# Patient Record
Sex: Male | Born: 1970 | Race: Black or African American | Hispanic: No | Marital: Single | Smoking: Current every day smoker
Health system: Southern US, Community
[De-identification: ages and names within clinical notes are randomized; demographics above are authoritative.]

## PROBLEM LIST (undated history)

## (undated) ENCOUNTER — Emergency Department: Disposition: A | Discharge status: Home / Self Care | Payer: Medicare Other

## (undated) DIAGNOSIS — G4730 Sleep apnea, unspecified: Secondary | ICD-10-CM

## (undated) DIAGNOSIS — R809 Proteinuria, unspecified: Secondary | ICD-10-CM

## (undated) DIAGNOSIS — N289 Disorder of kidney and ureter, unspecified: Secondary | ICD-10-CM

## (undated) DIAGNOSIS — E785 Hyperlipidemia, unspecified: Secondary | ICD-10-CM

## (undated) DIAGNOSIS — I219 Acute myocardial infarction, unspecified: Secondary | ICD-10-CM

## (undated) DIAGNOSIS — I2510 Atherosclerotic heart disease of native coronary artery without angina pectoris: Secondary | ICD-10-CM

## (undated) DIAGNOSIS — I10 Essential (primary) hypertension: Secondary | ICD-10-CM

## (undated) DIAGNOSIS — I639 Cerebral infarction, unspecified: Secondary | ICD-10-CM

## (undated) DIAGNOSIS — IMO0001 Reserved for inherently not codable concepts without codable children: Secondary | ICD-10-CM

## (undated) DIAGNOSIS — J449 Chronic obstructive pulmonary disease, unspecified: Secondary | ICD-10-CM

## (undated) DIAGNOSIS — N186 End stage renal disease: Secondary | ICD-10-CM

## (undated) DIAGNOSIS — N2581 Secondary hyperparathyroidism of renal origin: Secondary | ICD-10-CM

## (undated) HISTORY — DX: Chronic obstructive pulmonary disease, unspecified: J44.9

## (undated) HISTORY — PX: CARDIAC CATHETERIZATION: SHX172

## (undated) HISTORY — DX: Essential (primary) hypertension: I10

## (undated) HISTORY — DX: Proteinuria, unspecified: R80.9

## (undated) HISTORY — DX: Hyperlipidemia, unspecified: E78.5

## (undated) HISTORY — PX: CORONARY STENT PLACEMENT: SHX1402

## (undated) HISTORY — DX: Secondary hyperparathyroidism of renal origin: N25.81

## (undated) HISTORY — DX: Cerebral infarction, unspecified: I63.9

## (undated) HISTORY — DX: Atherosclerotic heart disease of native coronary artery without angina pectoris: I25.10

---

## 2003-08-26 ENCOUNTER — Emergency Department (HOSPITAL_COMMUNITY): Admission: EM | Admit: 2003-08-26 | Discharge: 2003-08-26 | Payer: Self-pay | Admitting: Emergency Medicine

## 2005-01-13 ENCOUNTER — Emergency Department: Payer: Self-pay | Admitting: Internal Medicine

## 2005-01-13 ENCOUNTER — Other Ambulatory Visit: Payer: Self-pay

## 2005-02-18 ENCOUNTER — Other Ambulatory Visit: Payer: Self-pay

## 2005-02-18 ENCOUNTER — Inpatient Hospital Stay: Payer: Self-pay | Admitting: Internal Medicine

## 2005-09-28 ENCOUNTER — Other Ambulatory Visit: Payer: Self-pay

## 2005-09-28 ENCOUNTER — Observation Stay: Payer: Self-pay | Admitting: Internal Medicine

## 2005-10-10 ENCOUNTER — Emergency Department: Payer: Self-pay | Admitting: Emergency Medicine

## 2005-10-10 ENCOUNTER — Other Ambulatory Visit: Payer: Self-pay

## 2005-10-28 ENCOUNTER — Other Ambulatory Visit: Payer: Self-pay

## 2005-10-28 ENCOUNTER — Emergency Department: Payer: Self-pay | Admitting: Emergency Medicine

## 2005-11-08 ENCOUNTER — Other Ambulatory Visit: Payer: Self-pay

## 2005-11-08 ENCOUNTER — Emergency Department: Payer: Self-pay | Admitting: General Practice

## 2006-11-14 IMAGING — CR DG CHEST 1V PORT
1 series · 1 of 1 positions shown · non-contrast
Comparison: none

REASON FOR EXAM: Chest pain //rm11
COMMENTS:

[view not recorded]
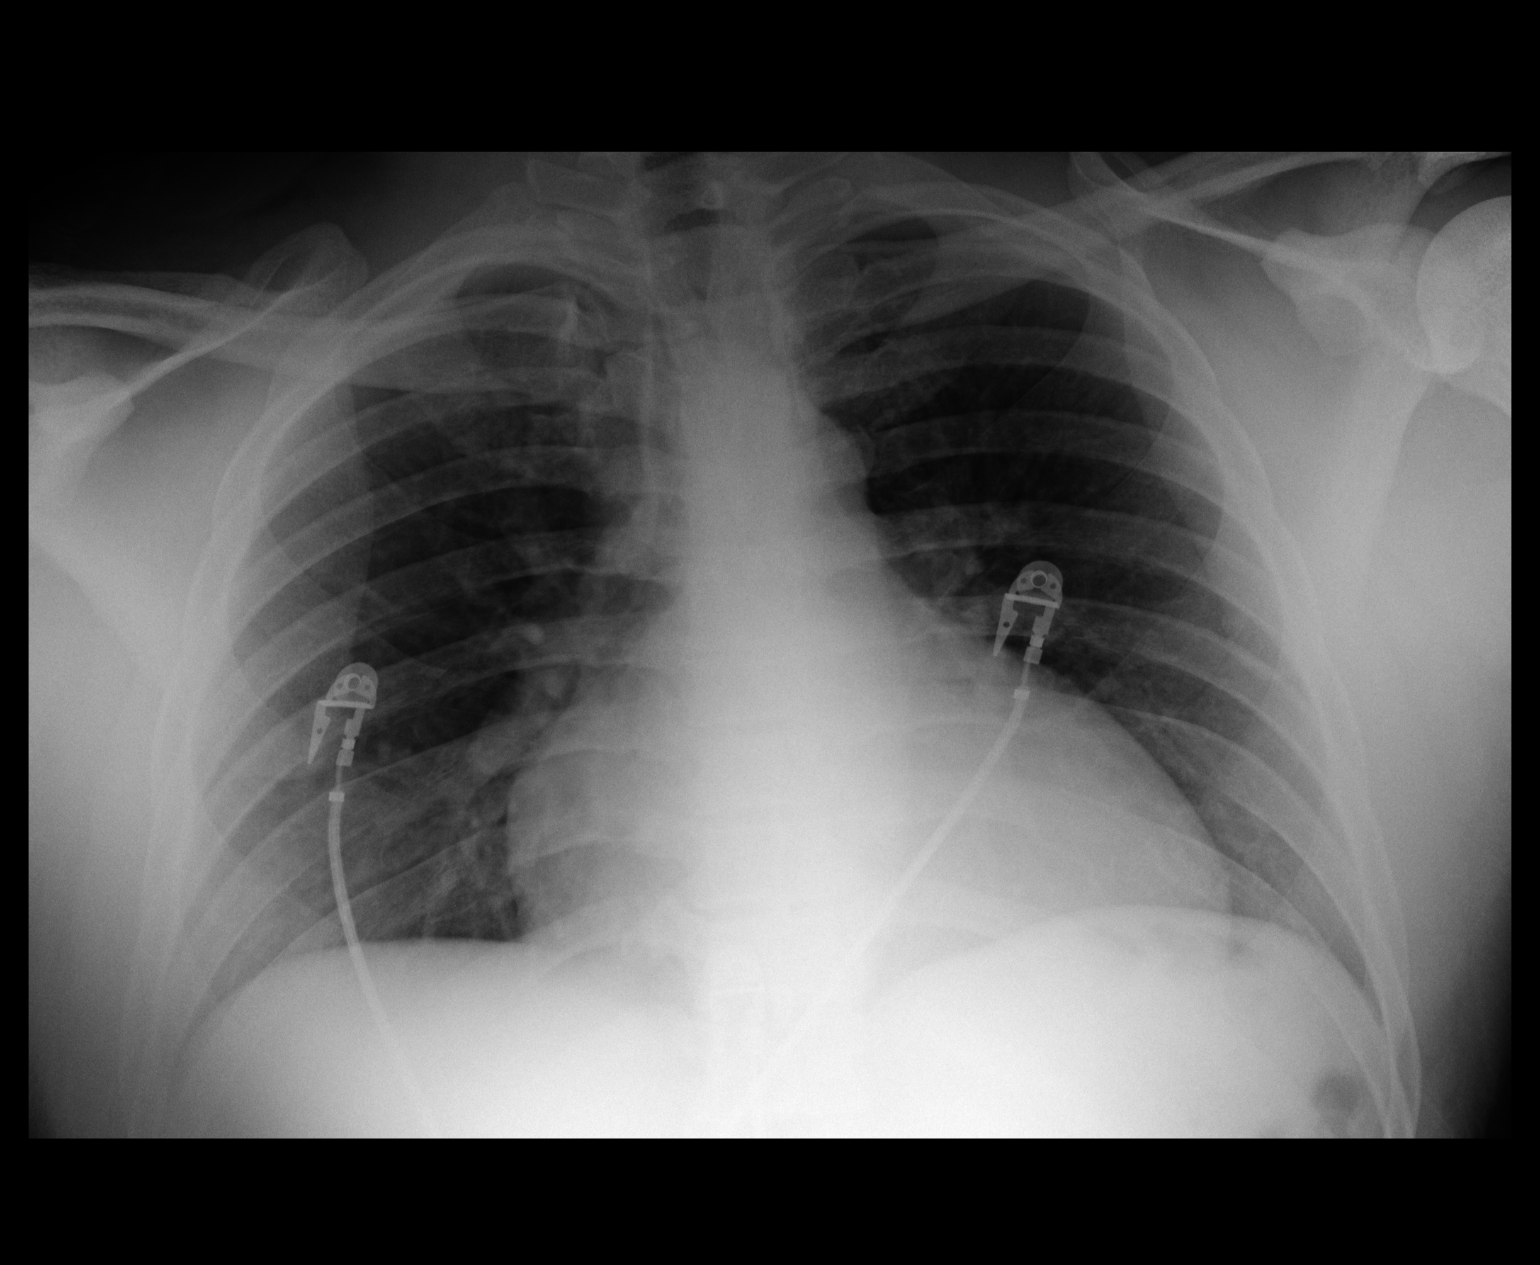

[1 of 1 positions shown; findings below may reference images not displayed]

PROCEDURE:     DXR - DXR PORTABLE CHEST SINGLE VIEW  - November 08, 2005  [DATE]

RESULT:     AP view of the chest is compared to the prior exam of 10/28/05.
The lung fields are clear.  The heart is enlarged but stable as compared to
the prior exam.  No pulmonary edema is seen.  Monitoring electrodes are
present.
IMPRESSION: The lung fields are clear.

Cardiomegaly.

## 2006-12-16 ENCOUNTER — Other Ambulatory Visit: Payer: Self-pay

## 2006-12-16 ENCOUNTER — Emergency Department: Payer: Self-pay | Admitting: Emergency Medicine

## 2011-09-14 ENCOUNTER — Emergency Department: Payer: Self-pay | Admitting: *Deleted

## 2011-09-14 LAB — COMPREHENSIVE METABOLIC PANEL
Albumin: 3.3 g/dL — ABNORMAL LOW (ref 3.4–5.0)
Anion Gap: 8 (ref 7–16)
Calcium, Total: 8.4 mg/dL — ABNORMAL LOW (ref 8.5–10.1)
EGFR (African American): 29 — ABNORMAL LOW
Glucose: 90 mg/dL (ref 65–99)
Osmolality: 291 (ref 275–301)
Potassium: 3.7 mmol/L (ref 3.5–5.1)
SGOT(AST): 29 U/L (ref 15–37)
Sodium: 143 mmol/L (ref 136–145)
Total Protein: 7.7 g/dL (ref 6.4–8.2)

## 2011-09-14 LAB — CBC
HGB: 13 g/dL (ref 13.0–18.0)
MCHC: 33.6 g/dL (ref 32.0–36.0)
RDW: 15.5 % — ABNORMAL HIGH (ref 11.5–14.5)
WBC: 8.8 10*3/uL (ref 3.8–10.6)

## 2011-09-14 LAB — TROPONIN I: Troponin-I: 0.04 ng/mL

## 2012-03-16 ENCOUNTER — Emergency Department: Payer: Self-pay | Admitting: Emergency Medicine

## 2012-03-16 LAB — BASIC METABOLIC PANEL
Anion Gap: 8 (ref 7–16)
BUN: 32 mg/dL — ABNORMAL HIGH (ref 7–18)
Calcium, Total: 8.5 mg/dL (ref 8.5–10.1)
Co2: 24 mmol/L (ref 21–32)
EGFR (African American): 26 — ABNORMAL LOW
EGFR (Non-African Amer.): 23 — ABNORMAL LOW
Glucose: 86 mg/dL (ref 65–99)
Potassium: 4.1 mmol/L (ref 3.5–5.1)
Sodium: 139 mmol/L (ref 136–145)

## 2012-03-24 ENCOUNTER — Inpatient Hospital Stay: Payer: Self-pay | Admitting: Internal Medicine

## 2012-03-24 LAB — URINALYSIS, COMPLETE
Bacteria: NONE SEEN
Bilirubin,UR: NEGATIVE
Glucose,UR: NEGATIVE mg/dL (ref 0–75)
Leukocyte Esterase: NEGATIVE
Nitrite: NEGATIVE
Protein: 100
RBC,UR: <1 /HPF (ref 0–5)
Specific Gravity: 1.009 (ref 1.003–1.030)
Squamous Epithelial: <1

## 2012-03-24 LAB — BASIC METABOLIC PANEL
BUN: 45 mg/dL — ABNORMAL HIGH (ref 7–18)
Calcium, Total: 8.5 mg/dL (ref 8.5–10.1)
Creatinine: 4.58 mg/dL — ABNORMAL HIGH (ref 0.60–1.30)
EGFR (African American): 17 — ABNORMAL LOW
Glucose: 80 mg/dL (ref 65–99)
Osmolality: 284 (ref 275–301)
Potassium: 3.9 mmol/L (ref 3.5–5.1)
Sodium: 137 mmol/L (ref 136–145)

## 2012-03-24 LAB — HEPATIC FUNCTION PANEL A (ARMC)
Albumin: 3.8 g/dL (ref 3.4–5.0)
Bilirubin, Direct: <0.1 mg/dL (ref 0.00–0.20)
Bilirubin,Total: 0.2 mg/dL (ref 0.2–1.0)
SGOT(AST): 21 U/L (ref 15–37)
SGPT (ALT): 19 U/L (ref 12–78)

## 2012-03-24 LAB — CBC
HGB: 13.9 g/dL (ref 13.0–18.0)
MCV: 86 fL (ref 80–100)
WBC: 8.5 10*3/uL (ref 3.8–10.6)

## 2012-03-24 LAB — TROPONIN I: Troponin-I: 0.06 ng/mL — ABNORMAL HIGH

## 2012-03-24 LAB — CK TOTAL AND CKMB (NOT AT ARMC): CK-MB: 2.2 ng/mL (ref 0.5–3.6)

## 2012-03-25 LAB — CBC WITH DIFFERENTIAL/PLATELET
Basophil #: 0 10*3/uL (ref 0.0–0.1)
Eosinophil %: 1.9 %
HCT: 38.1 % — ABNORMAL LOW (ref 40.0–52.0)
Lymphocyte #: 2.3 10*3/uL (ref 1.0–3.6)
MCHC: 34.1 g/dL (ref 32.0–36.0)
MCV: 85 fL (ref 80–100)
Monocyte #: 1 x10 3/mm (ref 0.2–1.0)
Monocyte %: 14.5 %
WBC: 6.9 10*3/uL (ref 3.8–10.6)

## 2012-03-25 LAB — BASIC METABOLIC PANEL
BUN: 49 mg/dL — ABNORMAL HIGH (ref 7–18)
Chloride: 106 mmol/L (ref 98–107)
EGFR (African American): 18 — ABNORMAL LOW
EGFR (Non-African Amer.): 15 — ABNORMAL LOW
Glucose: 109 mg/dL — ABNORMAL HIGH (ref 65–99)
Potassium: 3.7 mmol/L (ref 3.5–5.1)
Sodium: 140 mmol/L (ref 136–145)

## 2012-03-25 LAB — CK TOTAL AND CKMB (NOT AT ARMC)
CK, Total: 368 U/L — ABNORMAL HIGH (ref 35–232)
CK, Total: 359 U/L — ABNORMAL HIGH (ref 35–232)

## 2012-03-25 LAB — SODIUM, URINE, RANDOM: Sodium, Urine Random: 54 mmol/L (ref 20–110)

## 2012-03-25 LAB — PROTEIN / CREATININE RATIO, URINE: Protein/Creat. Ratio: 421 mg/gCREAT — ABNORMAL HIGH (ref 0–200)

## 2012-03-25 LAB — PHOSPHORUS: Phosphorus: 3.9 mg/dL (ref 2.5–4.9)

## 2012-03-26 ENCOUNTER — Ambulatory Visit: Payer: Self-pay | Admitting: Orthopedic Surgery

## 2012-03-26 LAB — BASIC METABOLIC PANEL
BUN: 42 mg/dL — ABNORMAL HIGH (ref 7–18)
Creatinine: 3.79 mg/dL — ABNORMAL HIGH (ref 0.60–1.30)
EGFR (Non-African Amer.): 19 — ABNORMAL LOW
Glucose: 89 mg/dL (ref 65–99)
Osmolality: 284 (ref 275–301)
Sodium: 137 mmol/L (ref 136–145)

## 2012-03-27 LAB — BASIC METABOLIC PANEL
Anion Gap: 10 (ref 7–16)
Calcium, Total: 8.9 mg/dL (ref 8.5–10.1)
Chloride: 106 mmol/L (ref 98–107)
Co2: 23 mmol/L (ref 21–32)
EGFR (Non-African Amer.): 20 — ABNORMAL LOW
Glucose: 69 mg/dL (ref 65–99)
Potassium: 3.7 mmol/L (ref 3.5–5.1)
Sodium: 139 mmol/L (ref 136–145)

## 2012-03-27 LAB — LIPID PANEL
Cholesterol: 262 mg/dL — ABNORMAL HIGH (ref 0–200)
HDL Cholesterol: 29 mg/dL — ABNORMAL LOW (ref 40–60)

## 2012-03-28 LAB — BASIC METABOLIC PANEL
Anion Gap: 8 (ref 7–16)
BUN: 40 mg/dL — ABNORMAL HIGH (ref 7–18)
Calcium, Total: 8.9 mg/dL (ref 8.5–10.1)
Chloride: 104 mmol/L (ref 98–107)
Co2: 25 mmol/L (ref 21–32)
Creatinine: 3.41 mg/dL — ABNORMAL HIGH (ref 0.60–1.30)
EGFR (African American): 24 — ABNORMAL LOW
EGFR (Non-African Amer.): 21 — ABNORMAL LOW
Glucose: 85 mg/dL (ref 65–99)
Osmolality: 283 (ref 275–301)
Potassium: 3.7 mmol/L (ref 3.5–5.1)
Sodium: 137 mmol/L (ref 136–145)

## 2012-03-28 LAB — PROTEIN ELECTROPHORESIS(ARMC)

## 2014-01-17 ENCOUNTER — Ambulatory Visit: Payer: Self-pay | Admitting: Vascular Surgery

## 2014-01-17 LAB — URINALYSIS, COMPLETE
Bacteria: NONE SEEN
Bilirubin,UR: NEGATIVE
Blood: NEGATIVE
GLUCOSE, UR: NEGATIVE mg/dL (ref 0–75)
Hyaline Cast: 2
Ketone: NEGATIVE
Leukocyte Esterase: NEGATIVE
NITRITE: NEGATIVE
Ph: 5 (ref 4.5–8.0)
Protein: 100
RBC,UR: <1 /HPF (ref 0–5)
SPECIFIC GRAVITY: 1.015 (ref 1.003–1.030)
Squamous Epithelial: 1

## 2014-01-17 LAB — PROTIME-INR
INR: 1.1
Prothrombin Time: 14 secs (ref 11.5–14.7)

## 2014-01-17 LAB — BASIC METABOLIC PANEL
ANION GAP: 3 — AB (ref 7–16)
BUN: 35 mg/dL — ABNORMAL HIGH (ref 7–18)
CHLORIDE: 111 mmol/L — AB (ref 98–107)
CREATININE: 3.84 mg/dL — AB (ref 0.60–1.30)
Calcium, Total: 8.6 mg/dL (ref 8.5–10.1)
Co2: 25 mmol/L (ref 21–32)
EGFR (African American): 22 — ABNORMAL LOW
EGFR (Non-African Amer.): 18 — ABNORMAL LOW
Glucose: 92 mg/dL (ref 65–99)
OSMOLALITY: 285 (ref 275–301)
Potassium: 4.7 mmol/L (ref 3.5–5.1)
Sodium: 139 mmol/L (ref 136–145)

## 2014-01-17 LAB — CBC
HCT: 40 % (ref 40.0–52.0)
HGB: 13.3 g/dL (ref 13.0–18.0)
MCH: 29.9 pg (ref 26.0–34.0)
MCHC: 33.2 g/dL (ref 32.0–36.0)
MCV: 90 fL (ref 80–100)
Platelet: 303 10*3/uL (ref 150–440)
RBC: 4.45 10*6/uL (ref 4.40–5.90)
RDW: 15.6 % — AB (ref 11.5–14.5)
WBC: 6.6 10*3/uL (ref 3.8–10.6)

## 2014-01-17 LAB — MRSA PCR SCREENING

## 2014-01-17 LAB — APTT: Activated PTT: 34.8 secs (ref 23.6–35.9)

## 2014-01-24 ENCOUNTER — Ambulatory Visit: Payer: Self-pay | Admitting: Vascular Surgery

## 2014-01-24 HISTORY — PX: AV FISTULA PLACEMENT: SHX1204

## 2014-03-12 ENCOUNTER — Other Ambulatory Visit: Payer: Self-pay | Admitting: *Deleted

## 2014-03-12 DIAGNOSIS — Z01810 Encounter for preprocedural cardiovascular examination: Secondary | ICD-10-CM

## 2014-03-12 DIAGNOSIS — N184 Chronic kidney disease, stage 4 (severe): Secondary | ICD-10-CM

## 2014-03-14 ENCOUNTER — Encounter: Payer: Self-pay | Admitting: Vascular Surgery

## 2014-04-04 ENCOUNTER — Encounter: Payer: Self-pay | Admitting: Vascular Surgery

## 2014-04-05 ENCOUNTER — Encounter: Payer: Self-pay | Admitting: Vascular Surgery

## 2014-04-05 ENCOUNTER — Ambulatory Visit (HOSPITAL_COMMUNITY)
Admission: RE | Admit: 2014-04-05 | Discharge: 2014-04-05 | Disposition: A | Discharge status: Home / Self Care | Payer: Medicare HMO | Source: Ambulatory Visit | Attending: Vascular Surgery | Admitting: Vascular Surgery

## 2014-04-05 ENCOUNTER — Ambulatory Visit (INDEPENDENT_AMBULATORY_CARE_PROVIDER_SITE_OTHER): Payer: Medicare HMO | Admitting: Vascular Surgery

## 2014-04-05 VITALS — BP 153/84 | HR 50 | Temp 97.8°F | Resp 14 | Ht 72.0 in | Wt 243.6 lb

## 2014-04-05 DIAGNOSIS — N184 Chronic kidney disease, stage 4 (severe): Secondary | ICD-10-CM

## 2014-04-05 DIAGNOSIS — Z01810 Encounter for preprocedural cardiovascular examination: Secondary | ICD-10-CM | POA: Diagnosis not present

## 2014-04-05 DIAGNOSIS — Z01812 Encounter for preprocedural laboratory examination: Secondary | ICD-10-CM

## 2014-04-05 DIAGNOSIS — N185 Chronic kidney disease, stage 5: Secondary | ICD-10-CM

## 2014-04-05 NOTE — Progress Notes (Signed)
HISTORY AND PHYSICAL     CC:  Evaluate access Referring Provider:  Lavonia Dana, MD  HPI: This is a 44 y.o. 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 Abigail Butts with [facility] in [place].  He states that he had a fistula placed in his left arm at the end of November.  He went back for his f/u appt and had a disagreement with the MD.  Dr. Abigail Butts has referred the pt to [facility] for further management of his fistula.  He states that he did get some numbness and tingling in his left arm after the fistula was placed.  He states that this has moved to his left thigh.  According to Dr. Marcy Salvo notes, he is to have his thigh pain evaluated by his primary MD.   He is hoping to be on the transplant list.  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  He states that he has 12 stents in his heart.  He has a hx of stroke also.    He takes a statin for his hypercholesterolemia.  He is on a beta blocker and aspirin for his heart disease.  He denies having diabetes.   Past Medical History  Diagnosis Date  . Chronic kidney disease   . Hypertension   . Stroke   . COPD (chronic obstructive pulmonary disease)   . Hyperlipidemia   . CAD (coronary artery disease)     status post multiple myocardial infarctions  . Proteinuria   . Secondary hyperparathyroidism of renal origin     Past Surgical History  Procedure Laterality Date  . Av fistula placement Left 01-24-14    By Dr. Hortencia Pilar in [place] Reactions  . Clonidine Derivatives   . Ibuprofen   . Viagra [Sildenafil]     Lips swell     Current Outpatient Prescriptions  Medication Sig Dispense Refill  . amLODipine (NORVASC) 10 MG tablet Take 10 mg by mouth daily.    Marland Kitchen aspirin 81 MG tablet Take 81 mg by mouth daily.    Marland Kitchen atorvastatin (LIPITOR) 20 MG tablet Take 20 mg by mouth daily.    . carvedilol (COREG) 6.25 MG tablet Take 6.25 mg by  mouth 2 (two) times daily with a meal.    . enalapril (VASOTEC) 10 MG tablet Take 10 mg by mouth daily.    Marland Kitchen HYDROcodone-acetaminophen (NORCO/VICODIN) 5-325 MG per tablet Take 1 tablet by mouth every 6 (six) hours as needed for moderate pain.    . nitroGLYCERIN (NITROSTAT) 0.4 MG SL tablet Place 0.4 mg under the tongue every 5 (five) minutes as needed for chest pain.     No current facility-administered medications for this visit.   Family History  Problem Relation Age of Onset  . Heart disease Mother   . Kidney disease Father   . Kidney disease Brother   . Hyperlipidemia Mother   . Hyperlipidemia Mother   . Hyperlipidemia Sister   . Hyperlipidemia Daughter   . Hypertension Mother   . Hypertension Sister   . Hypertension Daughter      History   Social History  . Marital Status: Divorced    Spouse Name: N/A    Number of Children: N/A  . Years of Education: N/A   Occupational History  . Not on file.   Social History Main Topics  . Smoking status: Current Every Day Smoker --  0.50 packs/day    Types: Cigarettes  . Smokeless tobacco: Not on file  . Alcohol Use: No  . Drug Use: Yes  . Sexual Activity: Not on file     Comment: " Smokes weed"   Other Topics Concern  . Not on file   Social History Narrative     ROS: [x]  Positive   [ ]  Negative   [ ]  All sytems reviewed and are negative  Cardiovascular: []  chest pain/pressure []  palpitations []  SOB lying flat []  DOE [x]  pain in legs while walking-left thigh numbness []  pain in feet when lying flat []  hx of DVT []  hx of phlebitis []  swelling in legs []  varicose veins  Pulmonary: []  productive cough []  asthma []  wheezing  Neurologic: []  weakness in []  arms []  legs []  numbness in []  arms []  legs [] difficulty speaking or slurred speech []  temporary loss of vision in one eye []  dizziness  Hematologic: []  bleeding problems []  problems with blood clotting easily  GI []  vomiting blood []  blood in  stool  GU: []  burning with urination []  blood in urine  Psychiatric: []  hx of major depression  Integumentary: []  rashes []  ulcers  Constitutional: []  fever []  chills   PHYSICAL EXAMINATION:  Filed Vitals:   04/05/14 1507  BP: 153/84  Pulse: 50  Temp: 97.8 F (36.6 C)  Resp: 14   Body mass index is 33.03 kg/(m^2).  General:  WDWN in NAD Gait: Normal HENT: WNL, normocephalic Pulmonary: normal non-labored breathing , without Rales, rhonchi,  wheezing Cardiac: RRR, without  Murmurs, rubs or gallops; without carotid bruits Skin: without rashes, without ulcers  Vascular Exam/Pulses:  Right Left  Radial 2+ (normal) 2+ (normal)  Ulnar Unable to palpate  Unable to palpate   brachial 2+ (normal) 2+ (normal)   Extremities: without ischemic changes, without Gangrene , without cellulitis; without open wounds; there is no bruit or thrill present within the fistula Musculoskeletal: no muscle wasting or atrophy  Neurologic: A&O X 3; Appropriate Affect ; SENSATION: normal; MOTOR FUNCTION:  moving all extremities equally. Speech is fluent/normal   Non-Invasive Vascular Imaging:   Occluded left brachiocephalic AVF  Pt meds includes: Statin:  Yes.   Beta Blocker:  Yes.   Aspirin:  Yes.   ACEI:  Yes.   ARB:  No. Other Antiplatelet/Anticoagulant:  No.    ASSESSMENT/PLAN:: 44 y.o. male with CKD IV who was [facility] for f/u of his fistula that was placed elsewhere.   -pt's dialysis duplex today reveals that his left brachiocephalic AVF that was placed in November of 2015 is occluded.  -the pt understands that this is occluded and we basically need to start evaluation for new access. -we will obtain vein mapping and determine what will be his best option for new access -he will come back next week for BUE vein mapping and Dr. Oneida Alar will call the pt to discuss best option.   [name], PA-C [facility] [phone]  Clinic MD:  Pt seen and  examined in conjunction with Dr. Oneida Alar  History and exam findings as above. He is right-handed. Patient has easily palpable brachial and radial pulses bilaterally. His previously placed left brachiocephalic fistula is occluded. The patient will return for bilateral upper extremity vein mapping next week. Based on the vein mapping findings we will plan a new access for him. I discussed with the patient today the possibility of a basilic vein transposition on the left side which would be more invasive  but stay in the left arm. Other options include possible radiocephalic or brachiocephalic fistula in the right arm and I discussed the possible complications of non-maturation of the fistula ischemic steal in the right arm. Other small complications including bleeding infection were discussed with the patient today as well. We will schedule him for a new access his symptoms we get the findings of the vein mapping next week.  Ruta Hinds, MD [facility] [place] Office: [phone] Pager: [phone]

## 2014-04-09 ENCOUNTER — Ambulatory Visit (HOSPITAL_COMMUNITY)
Admission: RE | Admit: 2014-04-09 | Discharge: 2014-04-09 | Disposition: A | Discharge status: Home / Self Care | Payer: Medicare HMO | Source: Ambulatory Visit | Attending: Vascular Surgery | Admitting: Vascular Surgery

## 2014-04-09 DIAGNOSIS — Z01812 Encounter for preprocedural laboratory examination: Secondary | ICD-10-CM

## 2014-04-09 DIAGNOSIS — N184 Chronic kidney disease, stage 4 (severe): Secondary | ICD-10-CM | POA: Insufficient documentation

## 2014-04-11 ENCOUNTER — Other Ambulatory Visit: Payer: Self-pay

## 2014-04-27 ENCOUNTER — Encounter (HOSPITAL_COMMUNITY): Payer: Self-pay | Admitting: *Deleted

## 2014-04-27 NOTE — Progress Notes (Signed)
SDW -pre-op assessment completed by pt. Pre-op instructions were given to Merry Proud, pt Education officer, museum as requested by pt.

## 2014-04-27 NOTE — Progress Notes (Signed)
Anesthesia Chart Review:  Pt is 44 year old male scheduled for R radiocephalic vs brachiocephalic AV fistula creation on 04/30/2014 with Dr. Oneida Alar.   Pt is same day work up.   PMH includes: pt reports hx of 13 heart attacks, 8 of them "massive" and hx of 12 cardiac stents. Also reports hx of stroke, COPD, HTN, CKD, hyperlipidemia.   EKG 01/17/2014: Sinus bradycardia (55 bpm). Nonspecific T wave abnormality. "When compared to EKG 03/24/2012, questionable change in QRS axis. ST elevation now present in inferior leads. T wave inversion less evident in lateral leads."  Echo 03/27/2012: -LV grossly normal size. There is no thrombus. LV systolic function is mildly reduced. EF = 45-50%. There is moderate concentric LV hypertrophy. There is mild global hypokinesis of the LV. There RV systolic function is borderline reduced.   By notes from [facility] 09/28/2005, pt had stent of OM1 branch done at [facility] 12/2004 following non-Q-wave MI. Pt underwent repeat cardiac cath 08/04/2005 which revealed restenosis of prior stent site, extending to the ostium and OM1 branch. Repeat PCI was not performed due to pt experiencing black, tarry stools.   Discussed limited information we have available with Dr. Conrad Harmony. Pt will need to be evaluated DOS.  Willeen Cass, FNP-BC [facility]/Anesthesiology Phone: [phone] 04/27/2014 4:21 PM

## 2014-04-29 MED ORDER — DEXTROSE 5 % IV SOLN
1.5000 g | INTRAVENOUS | Status: AC
  Administered 2014-04-30: 1.5 g via INTRAVENOUS
  Filled 2014-04-29: qty 1.5

## 2014-04-30 ENCOUNTER — Ambulatory Visit (HOSPITAL_COMMUNITY): Payer: Medicare HMO | Admitting: Emergency Medicine

## 2014-04-30 ENCOUNTER — Encounter (HOSPITAL_COMMUNITY): Payer: Self-pay | Admitting: *Deleted

## 2014-04-30 ENCOUNTER — Ambulatory Visit (HOSPITAL_COMMUNITY)
Admission: RE | Admit: 2014-04-30 | Discharge: 2014-04-30 | Disposition: A | Discharge status: Home / Self Care | Payer: Medicare HMO | Source: Ambulatory Visit | Attending: Vascular Surgery | Admitting: Vascular Surgery

## 2014-04-30 ENCOUNTER — Encounter (HOSPITAL_COMMUNITY)
Admission: RE | Disposition: A | Discharge status: Home / Self Care | Payer: Self-pay | Source: Ambulatory Visit | Attending: Vascular Surgery

## 2014-04-30 ENCOUNTER — Telehealth: Payer: Self-pay | Admitting: Vascular Surgery

## 2014-04-30 DIAGNOSIS — N186 End stage renal disease: Secondary | ICD-10-CM | POA: Diagnosis present

## 2014-04-30 DIAGNOSIS — Z79899 Other long term (current) drug therapy: Secondary | ICD-10-CM | POA: Insufficient documentation

## 2014-04-30 DIAGNOSIS — E785 Hyperlipidemia, unspecified: Secondary | ICD-10-CM | POA: Diagnosis not present

## 2014-04-30 DIAGNOSIS — Z955 Presence of coronary angioplasty implant and graft: Secondary | ICD-10-CM | POA: Insufficient documentation

## 2014-04-30 DIAGNOSIS — Z8673 Personal history of transient ischemic attack (TIA), and cerebral infarction without residual deficits: Secondary | ICD-10-CM | POA: Diagnosis not present

## 2014-04-30 DIAGNOSIS — Z841 Family history of disorders of kidney and ureter: Secondary | ICD-10-CM | POA: Diagnosis not present

## 2014-04-30 DIAGNOSIS — F17210 Nicotine dependence, cigarettes, uncomplicated: Secondary | ICD-10-CM | POA: Diagnosis not present

## 2014-04-30 DIAGNOSIS — I120 Hypertensive chronic kidney disease with stage 5 chronic kidney disease or end stage renal disease: Secondary | ICD-10-CM | POA: Diagnosis not present

## 2014-04-30 DIAGNOSIS — N185 Chronic kidney disease, stage 5: Secondary | ICD-10-CM

## 2014-04-30 DIAGNOSIS — J449 Chronic obstructive pulmonary disease, unspecified: Secondary | ICD-10-CM | POA: Diagnosis not present

## 2014-04-30 DIAGNOSIS — I252 Old myocardial infarction: Secondary | ICD-10-CM | POA: Diagnosis not present

## 2014-04-30 DIAGNOSIS — E780 Pure hypercholesterolemia: Secondary | ICD-10-CM | POA: Insufficient documentation

## 2014-04-30 DIAGNOSIS — I2510 Atherosclerotic heart disease of native coronary artery without angina pectoris: Secondary | ICD-10-CM | POA: Insufficient documentation

## 2014-04-30 DIAGNOSIS — Z7982 Long term (current) use of aspirin: Secondary | ICD-10-CM | POA: Insufficient documentation

## 2014-04-30 DIAGNOSIS — Z79891 Long term (current) use of opiate analgesic: Secondary | ICD-10-CM | POA: Diagnosis not present

## 2014-04-30 HISTORY — DX: Reserved for inherently not codable concepts without codable children: IMO0001

## 2014-04-30 HISTORY — PX: AV FISTULA PLACEMENT: SHX1204

## 2014-04-30 HISTORY — DX: Acute myocardial infarction, unspecified: I21.9

## 2014-04-30 LAB — POCT I-STAT 4, (NA,K, GLUC, HGB,HCT)
Glucose, Bld: 96 mg/dL (ref 70–99)
HCT: 41 % (ref 39.0–52.0)
Hemoglobin: 13.9 g/dL (ref 13.0–17.0)
Potassium: 4 mmol/L (ref 3.5–5.1)
Sodium: 140 mmol/L (ref 135–145)

## 2014-04-30 SURGERY — ARTERIOVENOUS (AV) FISTULA CREATION
Anesthesia: General | Site: Arm Upper | Laterality: Right

## 2014-04-30 MED ORDER — PROMETHAZINE HCL 25 MG/ML IJ SOLN: 6.2500 mg | INTRAMUSCULAR | Status: DC | PRN

## 2014-04-30 MED ORDER — PROPOFOL 10 MG/ML IV BOLUS
INTRAVENOUS | Status: DC | PRN
  Administered 2014-04-30: 20 mg via INTRAVENOUS
  Administered 2014-04-30: 40 mg via INTRAVENOUS
  Administered 2014-04-30: 130 mg via INTRAVENOUS

## 2014-04-30 MED ORDER — PROPOFOL 10 MG/ML IV BOLUS
INTRAVENOUS | Status: AC
  Filled 2014-04-30: qty 20

## 2014-04-30 MED ORDER — ROCURONIUM BROMIDE 50 MG/5ML IV SOLN
INTRAVENOUS | Status: AC
  Filled 2014-04-30: qty 1

## 2014-04-30 MED ORDER — FENTANYL CITRATE 0.05 MG/ML IJ SOLN
INTRAMUSCULAR | Status: AC
  Filled 2014-04-30: qty 2

## 2014-04-30 MED ORDER — SODIUM CHLORIDE 0.9 % IR SOLN
Status: DC | PRN
  Administered 2014-04-30: 500 mL

## 2014-04-30 MED ORDER — FENTANYL CITRATE 0.05 MG/ML IJ SOLN
25.0000 ug | INTRAMUSCULAR | Status: DC | PRN
  Administered 2014-04-30 (×2): 50 ug via INTRAVENOUS

## 2014-04-30 MED ORDER — LIDOCAINE HCL (PF) 1 % IJ SOLN
INTRAMUSCULAR | Status: AC
  Filled 2014-04-30: qty 30

## 2014-04-30 MED ORDER — PHENYLEPHRINE HCL 10 MG/ML IJ SOLN
10.0000 mg | INTRAVENOUS | Status: DC | PRN
  Administered 2014-04-30: 20 ug/min via INTRAVENOUS

## 2014-04-30 MED ORDER — GLYCOPYRROLATE 0.2 MG/ML IJ SOLN
INTRAMUSCULAR | Status: AC
  Filled 2014-04-30: qty 3

## 2014-04-30 MED ORDER — CHLORHEXIDINE GLUCONATE CLOTH 2 % EX PADS: 6.0000 | MEDICATED_PAD | Freq: Once | CUTANEOUS | Status: DC

## 2014-04-30 MED ORDER — EPHEDRINE SULFATE 50 MG/ML IJ SOLN
INTRAMUSCULAR | Status: DC | PRN
  Administered 2014-04-30 (×3): 10 mg via INTRAVENOUS

## 2014-04-30 MED ORDER — EPHEDRINE SULFATE 50 MG/ML IJ SOLN
INTRAMUSCULAR | Status: AC
  Filled 2014-04-30: qty 1

## 2014-04-30 MED ORDER — FENTANYL CITRATE 0.05 MG/ML IJ SOLN
INTRAMUSCULAR | Status: DC | PRN
  Administered 2014-04-30 (×4): 50 ug via INTRAVENOUS

## 2014-04-30 MED ORDER — MIDAZOLAM HCL 2 MG/2ML IJ SOLN
INTRAMUSCULAR | Status: AC
  Filled 2014-04-30: qty 2

## 2014-04-30 MED ORDER — DEXTROSE 5 % IV SOLN
INTRAVENOUS | Status: DC | PRN
  Administered 2014-04-30: 08:00:00 via INTRAVENOUS

## 2014-04-30 MED ORDER — MEPERIDINE HCL 25 MG/ML IJ SOLN: 6.2500 mg | INTRAMUSCULAR | Status: DC | PRN

## 2014-04-30 MED ORDER — ONDANSETRON HCL 4 MG/2ML IJ SOLN
INTRAMUSCULAR | Status: AC
  Filled 2014-04-30: qty 2

## 2014-04-30 MED ORDER — PHENYLEPHRINE 40 MCG/ML (10ML) SYRINGE FOR IV PUSH (FOR BLOOD PRESSURE SUPPORT)
PREFILLED_SYRINGE | INTRAVENOUS | Status: AC
  Filled 2014-04-30: qty 10

## 2014-04-30 MED ORDER — NEOSTIGMINE METHYLSULFATE 10 MG/10ML IV SOLN
INTRAVENOUS | Status: AC
  Filled 2014-04-30: qty 1

## 2014-04-30 MED ORDER — GLYCOPYRROLATE 0.2 MG/ML IJ SOLN
INTRAMUSCULAR | Status: DC | PRN
  Administered 2014-04-30: .05 mg via INTRAVENOUS
  Administered 2014-04-30: 0.6 mg via INTRAVENOUS
  Administered 2014-04-30: .15 mg via INTRAVENOUS

## 2014-04-30 MED ORDER — THROMBIN 20000 UNITS EX SOLR
CUTANEOUS | Status: AC
  Filled 2014-04-30: qty 20000

## 2014-04-30 MED ORDER — LIDOCAINE-EPINEPHRINE (PF) 1 %-1:200000 IJ SOLN
INTRAMUSCULAR | Status: AC
  Filled 2014-04-30: qty 10

## 2014-04-30 MED ORDER — FENTANYL CITRATE 0.05 MG/ML IJ SOLN
INTRAMUSCULAR | Status: AC
  Filled 2014-04-30: qty 5

## 2014-04-30 MED ORDER — ROCURONIUM BROMIDE 100 MG/10ML IV SOLN
INTRAVENOUS | Status: DC | PRN
  Administered 2014-04-30: 20 mg via INTRAVENOUS
  Administered 2014-04-30: 30 mg via INTRAVENOUS

## 2014-04-30 MED ORDER — LIDOCAINE-EPINEPHRINE (PF) 1 %-1:200000 IJ SOLN: INTRAMUSCULAR | Status: DC | PRN

## 2014-04-30 MED ORDER — HEPARIN SODIUM (PORCINE) 1000 UNIT/ML IJ SOLN
INTRAMUSCULAR | Status: AC
  Filled 2014-04-30: qty 1

## 2014-04-30 MED ORDER — LIDOCAINE HCL (CARDIAC) 20 MG/ML IV SOLN
INTRAVENOUS | Status: AC
  Filled 2014-04-30: qty 5

## 2014-04-30 MED ORDER — MIDAZOLAM HCL 5 MG/5ML IJ SOLN
INTRAMUSCULAR | Status: DC | PRN
  Administered 2014-04-30 (×2): 1 mg via INTRAVENOUS

## 2014-04-30 MED ORDER — LIDOCAINE HCL (CARDIAC) 20 MG/ML IV SOLN
INTRAVENOUS | Status: DC | PRN
  Administered 2014-04-30: 100 mg via INTRAVENOUS

## 2014-04-30 MED ORDER — ATROPINE SULFATE 1 MG/ML IJ SOLN
INTRAMUSCULAR | Status: DC | PRN
  Administered 2014-04-30: 0.2 mg via INTRAVENOUS

## 2014-04-30 MED ORDER — SODIUM CHLORIDE 0.9 % IJ SOLN
INTRAMUSCULAR | Status: AC
  Filled 2014-04-30: qty 10

## 2014-04-30 MED ORDER — NEOSTIGMINE METHYLSULFATE 10 MG/10ML IV SOLN
INTRAVENOUS | Status: DC | PRN
  Administered 2014-04-30: 4 mg via INTRAVENOUS

## 2014-04-30 MED ORDER — ATROPINE SULFATE 0.1 MG/ML IJ SOLN
INTRAMUSCULAR | Status: AC
  Filled 2014-04-30: qty 10

## 2014-04-30 MED ORDER — GLYCOPYRROLATE 0.2 MG/ML IJ SOLN
INTRAMUSCULAR | Status: AC
  Filled 2014-04-30: qty 1

## 2014-04-30 MED ORDER — 0.9 % SODIUM CHLORIDE (POUR BTL) OPTIME
TOPICAL | Status: DC | PRN
  Administered 2014-04-30: 1000 mL

## 2014-04-30 MED ORDER — TRAMADOL HCL 50 MG PO TABS: 50.0000 mg | ORAL_TABLET | Freq: Four times a day (QID) | ORAL | Status: DC | PRN

## 2014-04-30 MED ORDER — SODIUM CHLORIDE 0.9 % IV SOLN
INTRAVENOUS | Status: DC
  Administered 2014-04-30 (×2): via INTRAVENOUS

## 2014-04-30 MED ORDER — HEPARIN SODIUM (PORCINE) 1000 UNIT/ML IJ SOLN
INTRAMUSCULAR | Status: DC | PRN
  Administered 2014-04-30: 7000 [IU] via INTRAVENOUS

## 2014-04-30 SURGICAL SUPPLY — 40 items
ARMBAND PINK RESTRICT EXTREMIT (MISCELLANEOUS) ×2 IMPLANT
CANISTER SUCTION 2500CC (MISCELLANEOUS) ×2 IMPLANT
CANNULA VESSEL 3MM 2 BLNT TIP (CANNULA) ×2 IMPLANT
CLIP TI MEDIUM 6 (CLIP) ×2 IMPLANT
CLIP TI WIDE RED SMALL 6 (CLIP) ×2 IMPLANT
COVER PROBE W GEL 5X96 (DRAPES) ×1 IMPLANT
COVER SURGICAL LIGHT HANDLE (MISCELLANEOUS) IMPLANT
DECANTER SPIKE VIAL GLASS SM (MISCELLANEOUS) ×2 IMPLANT
DRAIN PENROSE 1/4X12 LTX STRL (WOUND CARE) ×2 IMPLANT
ELECT REM PT RETURN 9FT ADLT (ELECTROSURGICAL) ×2
ELECTRODE REM PT RTRN 9FT ADLT (ELECTROSURGICAL) ×1 IMPLANT
GLOVE BIO SURGEON STRL SZ7.5 (GLOVE) ×3 IMPLANT
GLOVE BIOGEL PI IND STRL 6.5 (GLOVE) ×1 IMPLANT
GLOVE BIOGEL PI IND STRL 7.0 (GLOVE) ×2 IMPLANT
GLOVE BIOGEL PI IND STRL 8 (GLOVE) ×1 IMPLANT
GLOVE BIOGEL PI INDICATOR 6.5 (GLOVE) ×1
GLOVE BIOGEL PI INDICATOR 7.0 (GLOVE) ×2
GLOVE BIOGEL PI INDICATOR 8 (GLOVE) ×1
GLOVE ECLIPSE 6.5 STRL STRAW (GLOVE) ×2 IMPLANT
GLOVE SURG SS PI 7.0 STRL IVOR (GLOVE) ×2 IMPLANT
GOWN STRL REUS W/ TWL LRG LVL3 (GOWN DISPOSABLE) ×4 IMPLANT
GOWN STRL REUS W/ TWL XL LVL3 (GOWN DISPOSABLE) ×1 IMPLANT
GOWN STRL REUS W/TWL LRG LVL3 (GOWN DISPOSABLE) ×8
GOWN STRL REUS W/TWL XL LVL3 (GOWN DISPOSABLE) ×2
KIT BASIN OR (CUSTOM PROCEDURE TRAY) ×2 IMPLANT
KIT ROOM TURNOVER OR (KITS) ×2 IMPLANT
LIQUID BAND (GAUZE/BANDAGES/DRESSINGS) ×2 IMPLANT
LOOP VESSEL MINI RED (MISCELLANEOUS) IMPLANT
NS IRRIG 1000ML POUR BTL (IV SOLUTION) ×2 IMPLANT
PACK CV ACCESS (CUSTOM PROCEDURE TRAY) ×2 IMPLANT
PAD ARMBOARD 7.5X6 YLW CONV (MISCELLANEOUS) ×4 IMPLANT
PROBE PENCIL 8 MHZ STRL DISP (MISCELLANEOUS) IMPLANT
SPONGE SURGIFOAM ABS GEL 100 (HEMOSTASIS) IMPLANT
SUT PROLENE 6 0 BV (SUTURE) ×1 IMPLANT
SUT PROLENE 7 0 BV 1 (SUTURE) ×2 IMPLANT
SUT VIC AB 3-0 SH 27 (SUTURE) ×2
SUT VIC AB 3-0 SH 27X BRD (SUTURE) ×1 IMPLANT
SUT VICRYL 4-0 PS2 18IN ABS (SUTURE) ×2 IMPLANT
UNDERPAD 30X30 INCONTINENT (UNDERPADS AND DIAPERS) ×2 IMPLANT
WATER STERILE IRR 1000ML POUR (IV SOLUTION) ×2 IMPLANT

## 2014-04-30 NOTE — Anesthesia Preprocedure Evaluation (Addendum)
Anesthesia Evaluation  Patient identified by MRN, date of birth, ID band Patient awake    Reviewed: Allergy & Precautions, NPO status , Patient's Chart, lab work & pertinent test results, reviewed documented beta blocker date and time   Airway Mallampati: II  TM Distance: >3 FB Neck ROM: Full    Dental  (+) Dental Advisory Given, Teeth Intact   Pulmonary COPDCurrent Smoker,  breath sounds clear to auscultation        Cardiovascular hypertension, Pt. on medications Rhythm:Regular  ECHO 2014 EF 45-50%, had STENT to OM 207, has since stenosed, mild diffuse hypokin   Neuro/Psych    GI/Hepatic negative GI ROS, Neg liver ROS,   Endo/Other    Renal/GU ESRFRenal diseasePending hemodialysis     Musculoskeletal   Abdominal (+)  Abdomen: soft.    Peds  Hematology   Anesthesia Other Findings   Reproductive/Obstetrics                        Anesthesia Physical Anesthesia Plan  ASA: III  Anesthesia Plan: General   Post-op Pain Management:    Induction: Intravenous  Airway Management Planned: Oral ETT  Additional Equipment:   Intra-op Plan:   Post-operative Plan:   Informed Consent: I have reviewed the patients History and Physical, chart, labs and discussed the procedure including the risks, benefits and alternatives for the proposed anesthesia with the patient or authorized representative who has indicated his/her understanding and acceptance.     Plan Discussed with:   Anesthesia Plan Comments: (Need am ISTAT)      Anesthesia Quick Evaluation

## 2014-04-30 NOTE — Transfer of Care (Signed)
Immediate Anesthesia Transfer of Care Note  Patient: Fernando Salas  Procedure(s) Performed: Procedure(s): RIGHT BRACHIOCEPHALIC ARTERIOVENOUS (AV) FISTULA CREATION (Right)  Patient Location: PACU  Anesthesia Type:General  Level of Consciousness: sedated  Airway & Oxygen Therapy: Patient Spontanous Breathing and Patient connected to nasal cannula oxygen  Post-op Assessment: Report given to RN and Patient moving all extremities  Post vital signs: Reviewed and stable  Last Vitals:  Filed Vitals:   04/30/14 0554  BP: 181/87  Pulse: 47  Temp: 36.7 C  Resp: 20    Complications: No apparent anesthesia complications

## 2014-04-30 NOTE — H&P (View-Only) (Signed)
HISTORY AND PHYSICAL     CC:  Evaluate access Referring Provider:  Lavonia Dana, MD  HPI: This is a 44 y.o. 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 Abigail Salas with [name] in [place].  He states that he had a fistula placed in his left arm at the end of November.  He went back for his f/u appt and had a disagreement with the MD.  Dr. Abigail Salas has referred the pt to [facility] for further management of his fistula.  He states that he did get some numbness and tingling in his left arm after the fistula was placed.  He states that this has moved to his left thigh.  According to Dr. Marcy Salas notes, he is to have his thigh pain evaluated by his primary MD.   He is hoping to be on the transplant list.  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  He states that he has 12 stents in his heart.  He has a hx of stroke also.    He takes a statin for his hypercholesterolemia.  He is on a beta blocker and aspirin for his heart disease.  He denies having diabetes.   Past Medical History  Diagnosis Date  . Chronic kidney disease   . Hypertension   . Stroke   . COPD (chronic obstructive pulmonary disease)   . Hyperlipidemia   . CAD (coronary artery disease)     status post multiple myocardial infarctions  . Proteinuria   . Secondary hyperparathyroidism of renal origin     Past Surgical History  Procedure Laterality Date  . Av fistula placement Left 01-24-14    By Dr. Hortencia Salas in [place] Reactions  . Clonidine Derivatives   . Ibuprofen   . Viagra [Sildenafil]     Lips swell     Current Outpatient Prescriptions  Medication Sig Dispense Refill  . amLODipine (NORVASC) 10 MG tablet Take 10 mg by mouth daily.    Marland Kitchen aspirin 81 MG tablet Take 81 mg by mouth daily.    Marland Kitchen atorvastatin (LIPITOR) 20 MG tablet Take 20 mg by mouth daily.    . carvedilol (COREG) 6.25 MG tablet Take 6.25 mg by  mouth 2 (two) times daily with a meal.    . enalapril (VASOTEC) 10 MG tablet Take 10 mg by mouth daily.    Marland Kitchen HYDROcodone-acetaminophen (NORCO/VICODIN) 5-325 MG per tablet Take 1 tablet by mouth every 6 (six) hours as needed for moderate pain.    . nitroGLYCERIN (NITROSTAT) 0.4 MG SL tablet Place 0.4 mg under the tongue every 5 (five) minutes as needed for chest pain.     No current facility-administered medications for this visit.   Family History  Problem Relation Age of Onset  . Heart disease Mother   . Kidney disease Father   . Kidney disease Brother   . Hyperlipidemia Mother   . Hyperlipidemia Mother   . Hyperlipidemia Sister   . Hyperlipidemia Daughter   . Hypertension Mother   . Hypertension Sister   . Hypertension Daughter      History   Social History  . Marital Status: Divorced    Spouse Name: N/A    Number of Children: N/A  . Years of Education: N/A   Occupational History  . Not on file.   Social History Main Topics  . Smoking status: Current Every Day Smoker --  0.50 packs/day    Types: Cigarettes  . Smokeless tobacco: Not on file  . Alcohol Use: No  . Drug Use: Yes  . Sexual Activity: Not on file     Comment: " Smokes weed"   Other Topics Concern  . Not on file   Social History Narrative     ROS: [x]  Positive   [ ]  Negative   [ ]  All sytems reviewed and are negative  Cardiovascular: []  chest pain/pressure []  palpitations []  SOB lying flat []  DOE [x]  pain in legs while walking-left thigh numbness []  pain in feet when lying flat []  hx of DVT []  hx of phlebitis []  swelling in legs []  varicose veins  Pulmonary: []  productive cough []  asthma []  wheezing  Neurologic: []  weakness in []  arms []  legs []  numbness in []  arms []  legs [] difficulty speaking or slurred speech []  temporary loss of vision in one eye []  dizziness  Hematologic: []  bleeding problems []  problems with blood clotting easily  GI []  vomiting blood []  blood in  stool  GU: []  burning with urination []  blood in urine  Psychiatric: []  hx of major depression  Integumentary: []  rashes []  ulcers  Constitutional: []  fever []  chills   PHYSICAL EXAMINATION:  Filed Vitals:   04/05/14 1507  BP: 153/84  Pulse: 50  Temp: 97.8 F (36.6 C)  Resp: 14   Body mass index is 33.03 kg/(m^2).  General:  WDWN in NAD Gait: Normal HENT: WNL, normocephalic Pulmonary: normal non-labored breathing , without Rales, rhonchi,  wheezing Cardiac: RRR, without  Murmurs, rubs or gallops; without carotid bruits Skin: without rashes, without ulcers  Vascular Exam/Pulses:  Right Left  Radial 2+ (normal) 2+ (normal)  Ulnar Unable to palpate  Unable to palpate   brachial 2+ (normal) 2+ (normal)   Extremities: without ischemic changes, without Gangrene , without cellulitis; without open wounds; there is no bruit or thrill present within the fistula Musculoskeletal: no muscle wasting or atrophy  Neurologic: A&O X 3; Appropriate Affect ; SENSATION: normal; MOTOR FUNCTION:  moving all extremities equally. Speech is fluent/normal   Non-Invasive Vascular Imaging:   Occluded left brachiocephalic AVF  Pt meds includes: Statin:  Yes.   Beta Blocker:  Yes.   Aspirin:  Yes.   ACEI:  Yes.   ARB:  No. Other Antiplatelet/Anticoagulant:  No.    ASSESSMENT/PLAN:: 44 y.o. male with CKD IV who was [facility] for f/u of his fistula that was placed elsewhere.   -pt's dialysis duplex today reveals that his left brachiocephalic AVF that was placed in November of 2015 is occluded.  -the pt understands that this is occluded and we basically need to start evaluation for new access. -we will obtain vein mapping and determine what will be his best option for new access -he will come back next week for BUE vein mapping and Dr. Oneida Alar will call the pt to discuss best option.   Fernando Locket, PA-C [facility] [phone]  Clinic MD:  Pt seen and  examined in conjunction with Dr. Oneida Alar  History and exam findings as above. He is right-handed. Patient has easily palpable brachial and radial pulses bilaterally. His previously placed left brachiocephalic fistula is occluded. The patient will return for bilateral upper extremity vein mapping next week. Based on the vein mapping findings we will plan a new access for him. I discussed with the patient today the possibility of a basilic vein transposition on the left side which would be more invasive  but stay in the left arm. Other options include possible radiocephalic or brachiocephalic fistula in the right arm and I discussed the possible complications of non-maturation of the fistula ischemic steal in the right arm. Other small complications including bleeding infection were discussed with the patient today as well. We will schedule him for a new access his symptoms we get the findings of the vein mapping next week.  Ruta Hinds, MD [facility] [place] Office: [phone] Pager: [phone]

## 2014-04-30 NOTE — Discharge Instructions (Signed)
°What to eat: ° °For your first meals, you should eat lightly; only small meals initially.  If you do not have nausea, you may eat larger meals.  Avoid spicy, greasy and heavy food.   ° °General Anesthesia, Adult, Care After  °Refer to this sheet in the next few weeks. These instructions provide you with information on caring for yourself after your procedure. Your health care provider may also give you more specific instructions. Your treatment has been planned according to current medical practices, but problems sometimes occur. Call your health care provider if you have any problems or questions after your procedure.  °WHAT TO EXPECT AFTER THE PROCEDURE  °After the procedure, it is typical to experience:  °Sleepiness.  °Nausea and vomiting. °HOME CARE INSTRUCTIONS  °For the first 24 hours after general anesthesia:  °Have a responsible person with you.  °Do not drive a car. If you are alone, do not take public transportation.  °Do not drink alcohol.  °Do not take medicine that has not been prescribed by your health care provider.  °Do not sign important papers or make important decisions.  °You may resume a normal diet and activities as directed by your health care provider.  °Change bandages (dressings) as directed.  °If you have questions or problems that seem related to general anesthesia, call the hospital and ask for the anesthetist or anesthesiologist on call. °SEEK MEDICAL CARE IF:  °You have nausea and vomiting that continue the day after anesthesia.  °You develop a rash. °SEEK IMMEDIATE MEDICAL CARE IF:  °You have difficulty breathing.  °You have chest pain.  °You have any allergic problems. °Document Released: 05/25/2000 Document Revised: 10/19/2012 Document Reviewed: 09/01/2012  °ExitCare® Patient Information ©2014 [employer].  ° °Sore Throat  ° ° °A sore throat is a painful, burning, sore, or scratchy feeling of the throat. There may be pain or tenderness when swallowing or talking. You may have  other symptoms with a sore throat. These include coughing, sneezing, fever, or a swollen neck. A sore throat is often the first sign of another sickness. These sicknesses may include a cold, flu, strep throat, or an infection called mono. Most sore throats go away without medical treatment.  °HOME CARE  °Only take medicine as told by your doctor.  °Drink enough fluids to keep your pee (urine) clear or pale yellow.  °Rest as needed.  °Try using throat sprays, lozenges, or suck on hard candy (if older than 4 years or as told).  °Sip warm liquids, such as broth, herbal tea, or warm water with honey. Try sucking on frozen ice pops or drinking cold liquids.  °Rinse the mouth (gargle) with salt water. Mix 1 teaspoon salt with 8 ounces of water.  °Do not smoke. Avoid being around others when they are smoking.  °Put a humidifier in your bedroom at night to moisten the air. You can also turn on a hot shower and sit in the bathroom for 5-10 minutes. Be sure the bathroom door is closed. °GET HELP RIGHT AWAY IF:  °You have trouble breathing.  °You cannot swallow fluids, soft foods, or your spit (saliva).  °You have more puffiness (swelling) in the throat.  °Your sore throat does not get better in 7 days.  °You feel sick to your stomach (nauseous) and throw up (vomit).  °You have a fever or lasting symptoms for more than 2-3 days.  °You have a fever and your symptoms suddenly get worse. °MAKE SURE YOU:  °Understand these   instructions.  °Will watch your condition.  °Will get help right away if you are not doing well or get worse. °Document Released: 11/26/2007 Document Revised: 11/11/2011 Document Reviewed: 10/25/2011  °ExitCare® Patient Information ©2015 [employer]. This information is not intended to replace advice given to you by your health care provider. Make sure you discuss any questions you have with your health care provider.  ° ° ° °

## 2014-04-30 NOTE — Telephone Encounter (Signed)
-----   Message from Mena Goes, RN sent at 04/30/2014  9:55 AM EST ----- Regarding: Schedule   ----- Message -----    From: Elam Dutch, MD    Sent: 04/30/2014   9:27 AM      To: [facility] Charge Pool  Right brachial cephalic AVF Follow up with me in 2 weeks Nurse asst  Ruta Hinds

## 2014-04-30 NOTE — Anesthesia Postprocedure Evaluation (Signed)
  Anesthesia Post-op Note  Patient: Fernando Salas  Procedure(s) Performed: Procedure(s): RIGHT BRACHIOCEPHALIC ARTERIOVENOUS (AV) FISTULA CREATION (Right)  Patient Location: PACU  Anesthesia Type:General  Level of Consciousness: awake and alert   Airway and Oxygen Therapy: Patient Spontanous Breathing and Patient connected to nasal cannula oxygen  Post-op Pain: mild  Post-op Assessment: Post-op Vital signs reviewed, Patient's Cardiovascular Status Stable, Respiratory Function Stable, Patent Airway and No signs of Nausea or vomiting  Post-op Vital Signs: Reviewed and stable  Last Vitals:  Filed Vitals:   04/30/14 0554  BP: 181/87  Pulse: 47  Temp: 36.7 C  Resp: 20    Complications: No apparent anesthesia complications

## 2014-04-30 NOTE — Telephone Encounter (Signed)
Left msg for patient re appt, dpm

## 2014-04-30 NOTE — Anesthesia Procedure Notes (Signed)
Procedure Name: Intubation Date/Time: 04/30/2014 7:51 AM Performed by: Terrill Mohr Pre-anesthesia Checklist: Patient identified, Emergency Drugs available, Suction available and Patient being monitored Patient Re-evaluated:Patient Re-evaluated prior to inductionOxygen Delivery Method: Circle system utilized Preoxygenation: Pre-oxygenation with 100% oxygen Intubation Type: IV induction Ventilation: Mask ventilation without difficulty Laryngoscope Size: Mac and 4 Grade View: Grade II Tube type: Oral Tube size: 7.5 mm Number of attempts: 1 Airway Equipment and Method: Stylet Placement Confirmation: ETT inserted through vocal cords under direct vision,  breath sounds checked- equal and bilateral and positive ETCO2 Secured at: 22 (cm at lips) cm Tube secured with: Tape Dental Injury: Teeth and Oropharynx as per pre-operative assessment

## 2014-04-30 NOTE — Op Note (Signed)
Procedure: Right Brachial Cephalic AV fistula  Preop: ESRD  Postop: ESRD  Anesthesia: General  Assistant:Lanita Presson, RNFA  Findings: 3.5 mm cephalic vein proximally tapering to 2 mm in the upper arm.  Thickened right brachial artery  Procedure: After obtaining informed consent, the patient was taken to the operating room.  After induction of general anesthesia, the right upper extremity was prepped and draped in usual sterile fashion.  A transverse incision was then made near the antecubital crease the right arm. The incision was carried into the subcutaneous tissues down to level of the cephalic vein. The cephalic vein was approximately 3 mm in diameter. It was of good quality proximally but tapered to 2 mm in the upper arm. This was dissected free circumferentially and small side branches ligated and divided between silk ties or clips. Next the brachial artery was dissected free in the medial portion of the incision. The artery was  3-4 mm in diameter but thickened on palpation. The vessel loops were placed proximal and distal to the planned site of arteriotomy. The patient was given 7000 units of intravenous heparin. After appropriate circulation time, the vessel loops were used to control the artery. A longitudinal opening was made in the brachial artery.  The vein was ligated distally with a 2-0 silk tie. The vein was controlled proximally with a fine bulldog clamp. The vein was then swung over to the artery and sewn end of vein to side of artery using a running 6-0 Prolene suture. Just prior to completion of the anastomosis, everything was fore bled back bled and thoroughly flushed. The anastomosis was secured, vessel loops released, and there was a pulsatile flow in the fistula immediately. After hemostasis was obtained, the subcutaneous tissues were reapproximated using a running 3-0 Vicryl suture. The skin was then closed with a 4 Vicryl subcuticular stitch. Dermabond was applied to the  skin incision.  The patient had a palpable radial pulse at the end of the case.  Ruta Hinds, MD [facility] [place] Office: [phone] Pager: [phone]

## 2014-04-30 NOTE — Interval H&P Note (Signed)
History and Physical Interval Note:  04/30/2014 7:32 AM  Fernando Salas  has presented today for surgery, with the diagnosis of Stage IV Chronic Kidney Disease N18.4  The various methods of treatment have been discussed with the patient and family. After consideration of risks, benefits and other options for treatment, the patient has consented to  Procedure(s): RADIOCEPHALIC VERSUS BRACHIOCEPHALIC ARTERIOVENOUS (AV) FISTULA CREATION (Right) as a surgical intervention .  The patient's history has been reviewed, patient examined, no change in status, stable for surgery.  I have reviewed the patient's chart and labs.  Questions were answered to the patient's satisfaction.     Vittorio Mohs E

## 2014-05-01 ENCOUNTER — Encounter (HOSPITAL_COMMUNITY): Payer: Self-pay | Admitting: Vascular Surgery

## 2014-05-14 DIAGNOSIS — Z0279 Encounter for issue of other medical certificate: Secondary | ICD-10-CM

## 2014-05-15 ENCOUNTER — Encounter: Payer: Self-pay | Admitting: Vascular Surgery

## 2014-05-16 ENCOUNTER — Encounter: Payer: Self-pay | Admitting: Vascular Surgery

## 2014-05-16 ENCOUNTER — Other Ambulatory Visit: Payer: Self-pay

## 2014-05-16 ENCOUNTER — Ambulatory Visit (INDEPENDENT_AMBULATORY_CARE_PROVIDER_SITE_OTHER): Payer: Self-pay | Admitting: Vascular Surgery

## 2014-05-16 VITALS — BP 150/81 | HR 51 | Resp 14 | Ht 72.0 in | Wt 227.0 lb

## 2014-05-16 DIAGNOSIS — N184 Chronic kidney disease, stage 4 (severe): Secondary | ICD-10-CM

## 2014-05-16 NOTE — Progress Notes (Signed)
Patient is a 44 year old male returns for postoperative follow-up today. He underwent placement of a right brachiocephalic AV fistula on February 29. The vein was marginal. He is currently not on dialysis. He states he may be on dialysis within the next 6 months. He is right-handed.  Physical exam:  Filed Vitals:   05/16/14 0921  BP: 150/81  Pulse: 51  Resp: 14  Height: 6' (1.829 m)  Weight: 227 lb (102.967 kg)    Left upper extremity: Well-healed left antecubital incision 2+ brachial and radial pulse  Right upper extremity: Healing right antecubital incision 2+ brachial and radial pulse no audible bruit or palpable thrill in fistula  Assessment: The patient has had 2 early failures a brachiocephalic AV fistula's. Although his basilic is patent in the right arm it is small. The left basilic vein all the patent may not mature. The patient is thoroughly frustrated that he has now had 2 access failures and less than one year.  I believe the best option at this point be to place a left upper arm AV graft. Clearly the patient is frustrated by failed access attempts. I believe this to be his best option to get access up and running. Risks benefits possible, patient and procedure details of AV graft were discussed the patient today including not limited to ischemic steal graft thrombosis 25% one year, infection bleeding. He understands and agrees to proceed.  Plan: Left arm AV graft 05/31/2014.  Ruta Hinds, MD [facility] [place] Office: [phone] Pager: [phone]

## 2014-05-17 ENCOUNTER — Encounter: Payer: Medicare HMO | Admitting: Vascular Surgery

## 2014-05-17 DIAGNOSIS — Z0279 Encounter for issue of other medical certificate: Secondary | ICD-10-CM | POA: Diagnosis not present

## 2014-05-29 ENCOUNTER — Encounter (HOSPITAL_COMMUNITY): Payer: Self-pay | Admitting: *Deleted

## 2014-05-29 NOTE — Progress Notes (Signed)
Pt SDW-pre-op call was completed by pt. Pt denied SOB and chest pain. While instructions were being provided,  pt stated " Miss,  I been through this 3 times ! " Pt verbalized understanding of al linstructions and ended call abruptly.

## 2014-05-30 MED ORDER — SODIUM CHLORIDE 0.9 % IV SOLN
INTRAVENOUS | Status: DC
  Administered 2014-05-31: 08:00:00 via INTRAVENOUS

## 2014-05-30 MED ORDER — CHLORHEXIDINE GLUCONATE CLOTH 2 % EX PADS: 6.0000 | MEDICATED_PAD | Freq: Once | CUTANEOUS | Status: DC

## 2014-05-30 MED ORDER — DEXTROSE 5 % IV SOLN
1.5000 g | INTRAVENOUS | Status: AC
  Administered 2014-05-31: 1.5 g via INTRAVENOUS
  Filled 2014-05-30: qty 1.5

## 2014-05-31 ENCOUNTER — Ambulatory Visit (HOSPITAL_COMMUNITY)
Admission: RE | Admit: 2014-05-31 | Discharge: 2014-05-31 | Disposition: A | Discharge status: Home / Self Care | Payer: Medicare HMO | Source: Ambulatory Visit | Attending: Vascular Surgery | Admitting: Vascular Surgery

## 2014-05-31 ENCOUNTER — Encounter (HOSPITAL_COMMUNITY): Payer: Self-pay | Admitting: *Deleted

## 2014-05-31 ENCOUNTER — Encounter (HOSPITAL_COMMUNITY)
Admission: RE | Disposition: A | Discharge status: Home / Self Care | Payer: Self-pay | Source: Ambulatory Visit | Attending: Vascular Surgery

## 2014-05-31 ENCOUNTER — Ambulatory Visit (HOSPITAL_COMMUNITY): Payer: Medicare HMO | Admitting: Anesthesiology

## 2014-05-31 DIAGNOSIS — E669 Obesity, unspecified: Secondary | ICD-10-CM | POA: Insufficient documentation

## 2014-05-31 DIAGNOSIS — G4730 Sleep apnea, unspecified: Secondary | ICD-10-CM | POA: Diagnosis not present

## 2014-05-31 DIAGNOSIS — F17210 Nicotine dependence, cigarettes, uncomplicated: Secondary | ICD-10-CM | POA: Diagnosis not present

## 2014-05-31 DIAGNOSIS — J449 Chronic obstructive pulmonary disease, unspecified: Secondary | ICD-10-CM | POA: Diagnosis not present

## 2014-05-31 DIAGNOSIS — N185 Chronic kidney disease, stage 5: Secondary | ICD-10-CM | POA: Insufficient documentation

## 2014-05-31 DIAGNOSIS — I2510 Atherosclerotic heart disease of native coronary artery without angina pectoris: Secondary | ICD-10-CM | POA: Diagnosis not present

## 2014-05-31 DIAGNOSIS — E785 Hyperlipidemia, unspecified: Secondary | ICD-10-CM | POA: Insufficient documentation

## 2014-05-31 DIAGNOSIS — I252 Old myocardial infarction: Secondary | ICD-10-CM | POA: Insufficient documentation

## 2014-05-31 DIAGNOSIS — N2581 Secondary hyperparathyroidism of renal origin: Secondary | ICD-10-CM | POA: Insufficient documentation

## 2014-05-31 DIAGNOSIS — Z955 Presence of coronary angioplasty implant and graft: Secondary | ICD-10-CM | POA: Diagnosis not present

## 2014-05-31 DIAGNOSIS — Z6830 Body mass index (BMI) 30.0-30.9, adult: Secondary | ICD-10-CM | POA: Diagnosis not present

## 2014-05-31 DIAGNOSIS — I120 Hypertensive chronic kidney disease with stage 5 chronic kidney disease or end stage renal disease: Secondary | ICD-10-CM | POA: Insufficient documentation

## 2014-05-31 HISTORY — PX: AV FISTULA PLACEMENT: SHX1204

## 2014-05-31 HISTORY — DX: Sleep apnea, unspecified: G47.30

## 2014-05-31 LAB — POCT I-STAT 4, (NA,K, GLUC, HGB,HCT)
GLUCOSE: 95 mg/dL (ref 70–99)
HEMATOCRIT: 43 % (ref 39.0–52.0)
Hemoglobin: 14.6 g/dL (ref 13.0–17.0)
Potassium: 4.1 mmol/L (ref 3.5–5.1)
Sodium: 141 mmol/L (ref 135–145)

## 2014-05-31 SURGERY — INSERTION OF ARTERIOVENOUS (AV) GORE-TEX GRAFT ARM
Anesthesia: Monitor Anesthesia Care | Site: Arm Upper | Laterality: Left

## 2014-05-31 MED ORDER — BUPIVACAINE-EPINEPHRINE 0.25% -1:200000 IJ SOLN
INTRAMUSCULAR | Status: DC | PRN
  Administered 2014-05-31: 28 mL

## 2014-05-31 MED ORDER — PROPOFOL 10 MG/ML IV BOLUS
INTRAVENOUS | Status: DC | PRN
  Administered 2014-05-31: 200 mg via INTRAVENOUS

## 2014-05-31 MED ORDER — HEPARIN SODIUM (PORCINE) 1000 UNIT/ML IJ SOLN
INTRAMUSCULAR | Status: DC | PRN
Start: 2014-05-31 — End: 2014-05-31
  Administered 2014-05-31: 10000 [IU] via INTRAVENOUS

## 2014-05-31 MED ORDER — MIDAZOLAM HCL 2 MG/2ML IJ SOLN
INTRAMUSCULAR | Status: AC
  Filled 2014-05-31: qty 2

## 2014-05-31 MED ORDER — ONDANSETRON HCL 4 MG/2ML IJ SOLN
INTRAMUSCULAR | Status: AC
  Filled 2014-05-31: qty 2

## 2014-05-31 MED ORDER — LIDOCAINE HCL (CARDIAC) 20 MG/ML IV SOLN
INTRAVENOUS | Status: DC | PRN
  Administered 2014-05-31: 100 mg via INTRAVENOUS

## 2014-05-31 MED ORDER — FENTANYL CITRATE 0.05 MG/ML IJ SOLN
INTRAMUSCULAR | Status: AC
  Filled 2014-05-31: qty 5

## 2014-05-31 MED ORDER — PROPOFOL 10 MG/ML IV BOLUS
INTRAVENOUS | Status: AC
  Filled 2014-05-31: qty 20

## 2014-05-31 MED ORDER — THROMBIN 20000 UNITS EX SOLR
CUTANEOUS | Status: AC
  Filled 2014-05-31: qty 20000

## 2014-05-31 MED ORDER — PHENYLEPHRINE HCL 10 MG/ML IJ SOLN
10.0000 mg | INTRAVENOUS | Status: DC | PRN
  Administered 2014-05-31: 10 ug/min via INTRAVENOUS

## 2014-05-31 MED ORDER — ONDANSETRON HCL 4 MG/2ML IJ SOLN
INTRAMUSCULAR | Status: DC | PRN
  Administered 2014-05-31: 4 mg via INTRAVENOUS

## 2014-05-31 MED ORDER — HYDROCODONE-ACETAMINOPHEN 5-325 MG PO TABS: 1.0000 | ORAL_TABLET | Freq: Four times a day (QID) | ORAL | Status: DC | PRN

## 2014-05-31 MED ORDER — PROTAMINE SULFATE 10 MG/ML IV SOLN
INTRAVENOUS | Status: DC | PRN
  Administered 2014-05-31 (×5): 10 mg via INTRAVENOUS

## 2014-05-31 MED ORDER — HEPARIN SODIUM (PORCINE) 1000 UNIT/ML IJ SOLN
INTRAMUSCULAR | Status: AC
  Filled 2014-05-31: qty 1

## 2014-05-31 MED ORDER — HEPARIN SODIUM (PORCINE) 5000 UNIT/ML IJ SOLN
INTRAMUSCULAR | Status: DC | PRN
  Administered 2014-05-31: 500 mL

## 2014-05-31 MED ORDER — FENTANYL CITRATE 0.05 MG/ML IJ SOLN
INTRAMUSCULAR | Status: DC | PRN
  Administered 2014-05-31 (×2): 50 ug via INTRAVENOUS

## 2014-05-31 MED ORDER — DEXTROSE 5 % IV SOLN
INTRAVENOUS | Status: DC | PRN
  Administered 2014-05-31: 13:00:00 via INTRAVENOUS

## 2014-05-31 MED ORDER — MIDAZOLAM HCL 5 MG/5ML IJ SOLN
INTRAMUSCULAR | Status: DC | PRN
  Administered 2014-05-31 (×2): 1 mg via INTRAVENOUS

## 2014-05-31 MED ORDER — SODIUM CHLORIDE 0.9 % IV SOLN
INTRAVENOUS | Status: DC | PRN
  Administered 2014-05-31 (×2): via INTRAVENOUS

## 2014-05-31 MED ORDER — LIDOCAINE HCL (PF) 1 % IJ SOLN
INTRAMUSCULAR | Status: DC | PRN
  Administered 2014-05-31: 28 mL

## 2014-05-31 MED ORDER — BUPIVACAINE-EPINEPHRINE (PF) 0.25% -1:200000 IJ SOLN
INTRAMUSCULAR | Status: AC
  Filled 2014-05-31: qty 30

## 2014-05-31 MED ORDER — PROTAMINE SULFATE 10 MG/ML IV SOLN
INTRAVENOUS | Status: AC
  Filled 2014-05-31: qty 5

## 2014-05-31 MED ORDER — 0.9 % SODIUM CHLORIDE (POUR BTL) OPTIME
TOPICAL | Status: DC | PRN
Start: 2014-05-31 — End: 2014-05-31
  Administered 2014-05-31: 1000 mL

## 2014-05-31 MED ORDER — LIDOCAINE HCL (PF) 1 % IJ SOLN
INTRAMUSCULAR | Status: AC
  Filled 2014-05-31: qty 30

## 2014-05-31 MED ORDER — PROPOFOL INFUSION 10 MG/ML OPTIME
INTRAVENOUS | Status: DC | PRN
  Administered 2014-05-31: 25 ug/kg/min via INTRAVENOUS

## 2014-05-31 SURGICAL SUPPLY — 34 items
ARMBAND PINK RESTRICT EXTREMIT (MISCELLANEOUS) ×2 IMPLANT
CANISTER SUCTION 2500CC (MISCELLANEOUS) ×2 IMPLANT
CANNULA VESSEL 3MM 2 BLNT TIP (CANNULA) ×2 IMPLANT
CLIP TI MEDIUM 6 (CLIP) ×2 IMPLANT
CLIP TI WIDE RED SMALL 6 (CLIP) ×2 IMPLANT
DECANTER SPIKE VIAL GLASS SM (MISCELLANEOUS) ×2 IMPLANT
ELECT REM PT RETURN 9FT ADLT (ELECTROSURGICAL) ×2
ELECTRODE REM PT RTRN 9FT ADLT (ELECTROSURGICAL) ×1 IMPLANT
GEL ULTRASOUND 20GR AQUASONIC (MISCELLANEOUS) IMPLANT
GLOVE BIO SURGEON STRL SZ 6.5 (GLOVE) ×4 IMPLANT
GLOVE BIO SURGEON STRL SZ7.5 (GLOVE) ×2 IMPLANT
GLOVE BIOGEL PI IND STRL 6.5 (GLOVE) ×3 IMPLANT
GLOVE BIOGEL PI INDICATOR 6.5 (GLOVE) ×3
GLOVE SURG SS PI 7.0 STRL IVOR (GLOVE) ×2 IMPLANT
GOWN STRL REUS W/ TWL LRG LVL3 (GOWN DISPOSABLE) ×3 IMPLANT
GOWN STRL REUS W/ TWL XL LVL3 (GOWN DISPOSABLE) ×1 IMPLANT
GOWN STRL REUS W/TWL LRG LVL3 (GOWN DISPOSABLE) ×4
GOWN STRL REUS W/TWL XL LVL3 (GOWN DISPOSABLE) ×2
GRAFT GORETEX STRT 6X50 (Vascular Products) ×2 IMPLANT
KIT BASIN OR (CUSTOM PROCEDURE TRAY) ×2 IMPLANT
KIT ROOM TURNOVER OR (KITS) ×2 IMPLANT
LIQUID BAND (GAUZE/BANDAGES/DRESSINGS) ×2 IMPLANT
LOOP VESSEL MINI RED (MISCELLANEOUS) IMPLANT
NS IRRIG 1000ML POUR BTL (IV SOLUTION) ×2 IMPLANT
PACK CV ACCESS (CUSTOM PROCEDURE TRAY) ×2 IMPLANT
PAD ARMBOARD 7.5X6 YLW CONV (MISCELLANEOUS) ×4 IMPLANT
SPONGE SURGIFOAM ABS GEL 100 (HEMOSTASIS) IMPLANT
SUT PROLENE 6 0 CC (SUTURE) ×4 IMPLANT
SUT VIC AB 3-0 SH 27 (SUTURE) ×4
SUT VIC AB 3-0 SH 27X BRD (SUTURE) ×2 IMPLANT
SUT VICRYL 4-0 PS2 18IN ABS (SUTURE) ×4 IMPLANT
SYR BULB IRRIGATION 50ML (SYRINGE) ×1 IMPLANT
UNDERPAD 30X30 INCONTINENT (UNDERPADS AND DIAPERS) ×2 IMPLANT
WATER STERILE IRR 1000ML POUR (IV SOLUTION) IMPLANT

## 2014-05-31 NOTE — Anesthesia Procedure Notes (Signed)
Procedure Name: LMA Insertion Date/Time: 05/31/2014 1:07 PM Performed by: Williemae Area B Pre-anesthesia Checklist: Patient identified, Emergency Drugs available, Suction available and Patient being monitored Patient Re-evaluated:Patient Re-evaluated prior to inductionOxygen Delivery Method: Circle system utilized Preoxygenation: Pre-oxygenation with 100% oxygen Intubation Type: IV induction Ventilation: Mask ventilation without difficulty LMA: LMA inserted LMA Size: 5.0 Number of attempts: 1 Placement Confirmation: positive ETCO2 and breath sounds checked- equal and bilateral Tube secured with: Tape (taped across cheeks; gauze roll b/t teeth) Dental Injury: Teeth and Oropharynx as per pre-operative assessment

## 2014-05-31 NOTE — Interval H&P Note (Signed)
History and Physical Interval Note:  05/31/2014 7:25 AM  Fernando Salas  has presented today for surgery, with the diagnosis of End Stage Renal Disease N18.6  The various methods of treatment have been discussed with the patient and family. After consideration of risks, benefits and other options for treatment, the patient has consented to  Procedure(s): INSERTION OF ARTERIOVENOUS (AV) GORE-TEX GRAFT ARM (Left) as a surgical intervention .  The patient's history has been reviewed, patient examined, no change in status, stable for surgery.  I have reviewed the patient's chart and labs.  Questions were answered to the patient's satisfaction.     FIELDS,CHARLES E

## 2014-05-31 NOTE — H&P (View-Only) (Signed)
Patient is a 44 year old male returns for postoperative follow-up today. He underwent placement of a right brachiocephalic AV fistula on February 29. The vein was marginal. He is currently not on dialysis. He states he may be on dialysis within the next 6 months. He is right-handed.  Physical exam:  Filed Vitals:   05/16/14 0921  BP: 150/81  Pulse: 51  Resp: 14  Height: 6' (1.829 m)  Weight: 227 lb (102.967 kg)    Left upper extremity: Well-healed left antecubital incision 2+ brachial and radial pulse  Right upper extremity: Healing right antecubital incision 2+ brachial and radial pulse no audible bruit or palpable thrill in fistula  Assessment: The patient has had 2 early failures a brachiocephalic AV fistula's. Although his basilic is patent in the right arm it is small. The left basilic vein all the patent may not mature. The patient is thoroughly frustrated that he has now had 2 access failures and less than one year.  I believe the best option at this point be to place a left upper arm AV graft. Clearly the patient is frustrated by failed access attempts. I believe this to be his best option to get access up and running. Risks benefits possible, patient and procedure details of AV graft were discussed the patient today including not limited to ischemic steal graft thrombosis 25% one year, infection bleeding. He understands and agrees to proceed.  Plan: Left arm AV graft 05/31/2014.  Ruta Hinds, MD [facility] [place] Office: [phone] Pager: [phone]

## 2014-05-31 NOTE — Op Note (Signed)
Procedure: Left Upper Arm AV graft  Preop: ESRD  Postop: ESRD  Anesthesia: General  Assistant: Silva Bandy, PA-C  Findings:6 mm PTFE end to side to axillary vein   Procedure Details: After induction of general anesthesia, the left upper extremity was prepped and draped in usual sterile fashion.  A longitudinal incision was then made near the antecubital crease the left arm. The incision was carried into the subcutaneous tissues down to level of the brachial artery.     Next the brachial artery was dissected free in the medial portion incision. The artery was  3-4 mm in diameter. The vessel loops were placed proximal and distal to the planned site of arteriotomy. At this point, a longitudinal incision was made in the axilla and carried through the subcutaneous tissues and fascia to expose the axillary vein.  The nerves were protected.  The vein was approximately 5-7 mm in diameter.  It was dissected free and small side branches controlled with vessel loops.  Next, a subcutaneous tunnel was created connecting the upper arm to the lower arm incision in an arcing configuration over the biceps muscle.  A 6 mm PTFE graft was then brought through this subcutaneous tunnel. The patient was given 10000 units of intravenous heparin. After appropriate circulation time, the vessel loops were used to control the artery. A longitudinal opening was made in the left brachial artery.  The end of the graft was beveled and sewn end to side to the artery using a 6 0 prolene.  At completion of the anastomosis the artery was forward bled, backbled and thoroughly flushed.  The anastomosis was secured, vessel loops were released and there was palpable pulse in the graft.  The graft was clamped just above the arterial anastomosis with a fistula clamp. The graft was then pulled taut to length at the axillary incision.  The axillary vein was controlled with a fine bulldog clamp proximally and distally.  A longitudinal opening was  made in the graft.  The distal end of the graft was then beveled and sewn end to side to the vein using a running 6 0 prolene.  Just prior to completion of the anastomosis, everything was forward bled, back bled and thoroughly flushed.  The anastomosis was secured and the fistula clamp removed from the proximal graft.  A thrill was immediately palpable in the graft. The patient was given 50 mg of protamine to assist with hemostasis.  After hemostasis was obtained, the subcutaneous tissues were reapproximated using a running 3-0 Vicryl suture. The skin was then closed with a 4 0 Vicryl subcuticular stitch. Liquiband was applied to the skin incisions.  The patient tolerated the procedure well and there were no complications.  Instrument sponge and needle count was correct at the end of the case.  There was good radial doppler flow which augmented 30-50% with clamping the graft. The patient was taken to the recovery room in stable condition.  Ruta Hinds, MD [facility] [place] Office: [phone] Pager: [phone]

## 2014-05-31 NOTE — Anesthesia Postprocedure Evaluation (Signed)
  Anesthesia Post-op Note  Patient: Fernando Salas  Procedure(s) Performed: Procedure(s): Left INSERTION OF ARTERIOVENOUS (AV) GORE-TEX GRAFT ARM (Left)  Patient Location: PACU  Anesthesia Type:General  Level of Consciousness: awake and alert   Airway and Oxygen Therapy: Patient Spontanous Breathing  Post-op Pain: mild  Post-op Assessment: Post-op Vital signs reviewed  Post-op Vital Signs: stable  Last Vitals:  Filed Vitals:   05/31/14 1533  BP: 123/69  Pulse:   Temp: 36.6 C  Resp: 16    Complications: No apparent anesthesia complications

## 2014-05-31 NOTE — Anesthesia Preprocedure Evaluation (Addendum)
Anesthesia Evaluation  Patient identified by MRN, date of birth, ID band Patient awake    Reviewed: Allergy & Precautions, NPO status , Patient's Chart, lab work & pertinent test results  History of Anesthesia Complications Negative for: history of anesthetic complications  Airway Mallampati: II  TM Distance: >3 FB Neck ROM: Full    Dental  (+) Teeth Intact, Missing   Pulmonary shortness of breath, sleep apnea , COPDCurrent Smoker,  Pt denies respiratory problems breath sounds clear to auscultation        Cardiovascular hypertension, Pt. on home beta blockers + CAD, + Past MI and + Cardiac Stents Rhythm:Regular Rate:Bradycardia  Pt denies taking any meds this morning   Neuro/Psych Pt was rather hostile in interviewCVA    GI/Hepatic   Endo/Other    Renal/GU Renal InsufficiencyRenal diseasePending hemodialysis     Musculoskeletal   Abdominal (+) + obese,   Peds  Hematology   Anesthesia Other Findings   Reproductive/Obstetrics                          Anesthesia Physical Anesthesia Plan  ASA: III  Anesthesia Plan: MAC and General   Post-op Pain Management:    Induction: Intravenous  Airway Management Planned: Natural Airway, Simple Face Mask and LMA  Additional Equipment:   Intra-op Plan:   Post-operative Plan:   Informed Consent:   Dental advisory given  Plan Discussed with: CRNA and Surgeon  Anesthesia Plan Comments:        Anesthesia Quick Evaluation

## 2014-05-31 NOTE — Progress Notes (Signed)
I went in to explain to patient about the instructions for arrival time. I  Apologized to him stating that his original surgery time was at [place] which would make his arrival time at 0700.  Then the surgery time was changed and when the surgery schedule was checked last night the time change was accidentally missed. He stated that someone should have let him know, because they had to get up early and someone else had to get up early to bring him here, " but     it's okay".  "it's done now". "I don't want anybody to appologize to me".  "what's happened has happened, it's okay".  I then informed him that his surgery was now going to be around 1300, or in about 30 minutes.  He began using profanity and complaining about the time changing /delays, stating " it's one lie after another", and continuing with the profanity. When I tried to empathize with him starting with "I understand", He cut me off stating " you don't know how I feel".  I said " I didn;'t say that I know how you feel, I was going to say that I understand what you're saying".  The patient then stated "I don't want anybody else to come in here for anything unless they are taking me to surgery".  I stated "okay" and walked out of the room.

## 2014-05-31 NOTE — Transfer of Care (Signed)
Immediate Anesthesia Transfer of Care Note  Patient: Fernando Salas  Procedure(s) Performed: Procedure(s): Left INSERTION OF ARTERIOVENOUS (AV) GORE-TEX GRAFT ARM (Left)  Patient Location: PACU  Anesthesia Type:General  Level of Consciousness: awake and pateint uncooperative  Airway & Oxygen Therapy: Patient Spontanous Breathing and Pt refused nasal oxygen  Post-op Assessment: Report given to RN, Post -op Vital signs reviewed and stable and Patient moving all extremities  Post vital signs: Reviewed and stable  Last Vitals:  Filed Vitals:   05/31/14 0727  BP: 152/72  Pulse: 49  Temp: 36.6 C  Resp: 14    Complications: No apparent anesthesia complications

## 2014-06-04 ENCOUNTER — Encounter (HOSPITAL_COMMUNITY): Payer: Self-pay | Admitting: Vascular Surgery
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
## 2014-06-22 NOTE — H&P (Signed)
PATIENT NAME:  Fernando Salas, Fernando Salas MR#:  [id number] DATE OF BIRTH:  June 17, 1970  DATE OF ADMISSION:  03/24/2012  PRESENTING COMPLAINT: Headache and elevated sugars.   PRIMARY CARE PHYSICIAN: None.   HISTORY OF PRESENT ILLNESS: The patient is a 44 year old African American gentleman with history of hypertension, history of heart attack (according to patient, he had "13 heart attacks"), status post cardiac stent and hypercholesterolemia, comes to the Emergency Room from the jail accompanied by officers from the sheriff's department with complaints of headache. The patient's blood pressure on arrival to the Emergency Room was 250/130. He received IV labetalol 10 mg IV push. He received 25 mg of p.o. hydralazine stat and got nitroglycerin paste. Currently during my evaluation, his blood pressure was 244/132. Labetalol drip has been ordered. The patient denies any nausea, vomiting. He did have some dizziness. The patient is being admitted for hypertensive emergency.   PAST MEDICAL HISTORY:  1.  Hypercholesterolemia.  2.  CAD, status post stent in the past. The patient states he had "13 heart attacks."  3.  Hypertension.   FAMILY HISTORY: Positive for hypertension and heart disease.   ALLERGIES: CLONIDINE.   MEDICATIONS:  1.  Metoprolol 50 mg extended-release once a day.  2.  Ibuprofen 800 mg 1 tablet 3 times a day.  3.  Hydrochlorothiazide 25 mg daily.  4.  Hydralazine 25 mg 1 tablet 3 times a day.   SOCIAL HISTORY: The patient is currently incarcerated in jail. He used to smoke 10 cigarettes a day and uses marijuana "when I have access to it." Denies any alcohol use.   REVIEW OF SYSTEMS: CONSTITUTIONAL: No fever, fatigue, weakness.  EYES: No blurred or double vision. No glaucoma.  ENT: No tinnitus, ear pain, hearing loss.  RESPIRATORY: No cough, wheeze, hemoptysis, COPD.  CARDIOVASCULAR: No chest pain, orthopnea, edema. Positive for hypertension.  GASTROINTESTINAL: No nausea, vomiting, diarrhea,  abdominal pain.  GENITOURINARY: No dysuria or hematuria.  ENDOCRINE: No polyuria, nocturia or thyroid problems.  HEMATOLOGY: No anemia or easy bruising.  SKIN: No acne or rash.  MUSCULOSKELETAL: Positive for arthritis.  NEUROLOGIC: No CVA or TIA.  PSYCHIATRIC: No anxiety or depression. All other systems reviewed and negative.   PHYSICAL EXAMINATION:  GENERAL: The patient is awake, alert, oriented x 3. He is not in acute distress.  VITAL SIGNS: Afebrile. Pulse is 78, blood pressure 244/132, sats 99% on room air.  HEENT: Atraumatic, normocephalic. PERRLA. EOM intact. Oral mucosa is moist.  NECK: Supple. No JVD. No carotid bruit.  LUNGS: Clear to auscultation bilaterally. No rales, rhonchi, respiratory distress or labored breathing.  HEART: Both the heart sounds are normal. Rate, rhythm is regular. PMI not lateralized. Chest nontender.  EXTREMITIES: Good pedal pulses, good femoral pulses. No lower extremity edema.  ABDOMEN: Soft, benign, nontender. No organomegaly. Positive bowel sounds.  NEUROLOGIC: Grossly intact cranial nerves II through XII. No motor or sensory deficits.  PSYCHIATRIC: The patient is awake, alert, oriented x 3.  SKIN: Warm and dry.   LABORATORY AND DIAGNOSTIC DATA: UA negative for UTI, 100 mg/dL proteinuria present. CBC within normal limits. Creatinine is 4.5, rest of the chemistries normal. Troponin is 0.06. LFTs within normal limits. EKG shows flipped T waves in inferolateral leads, which is similar to old EKG.   ASSESSMENT: The patient is a 44 year old with history of hypertension, coronary artery disease, comes in with:  1.  Hypertensive emergency: The patient comes in with blood pressure of 250/133. He is going to be admitted to Intensive  Care Unit, 2 gram sodium diet, will start him on labetalol drip. I will continue his metoprolol, hydralazine and oral hydrochlorothiazide, and doses will be adjusted according to blood pressure readings. Will cycle cardiac enzymes x 3.  Wean off labetalol drip as blood pressure gets better.  2.  Chronic kidney disease stage IV: The patient's creatinine was 3.2 a couple of weeks ago and is up to 4.5, appears to be secondary to uncontrolled hypertension. Will have nephrology consultation while patient is in-house. Consider ultrasound of kidneys.  3.  Borderline elevated troponin without any chest pain or any new EKG changes: Appears demand ischemia from hypertensive emergency/crisis and chronic kidney disease stage IV. Will continue to cycle cardiac enzymes x 3 and give the patient baby aspirin. Continue Nitro-Bid for now.  4.  Hyperlipidemia: Will check lipid profile. The patient currently is not on statins. Consider starting Pravachol.  5.  Deep vein thrombosis prophylaxis: Heparin subcutaneously. 6.  Further workup according to the patient's clinical course. Hospital admission plan was discussed with the patient.   CRITICAL TIME SPENT: 60 minutes.   ____________________________ Hart Rochester Posey Pronto, MD sap:jm D: 03/24/2012 20:05:14 ET T: 03/24/2012 20:50:45 ET JOB#: [id number]  cc: Akosua Constantine A. Posey Pronto, MD, <Dictator> Ilda Basset MD ELECTRONICALLY SIGNED 03/25/2012 21:34
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
## 2014-06-22 NOTE — Discharge Summary (Signed)
PATIENT NAME:  Fernando Salas, Fernando Salas MR#:  [id number] DATE OF BIRTH:  1971-02-12  DATE OF ADMISSION:  03/24/2012 DATE OF DISCHARGE:  03/28/2012  ADMITTING DIAGNOSIS: Malignant hypertension.  DISCHARGE DIAGNOSES: 1. 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 2. Headache due to malignant hypertension.  3. Acute on chronic renal failure with creatinine on the day of discharge 3.41. 4. Elevated troponin, no acute coronary syndrome, likely due to hypertension. 5. Hyperlipidemia.  6. Medical noncompliance. 7. Gallstones.  8. Upper abdominal pain of unclear etiology, possible gastritis.  9. History of coronary artery disease status post myocardial infarction in the past.  10. History of cerebrovascular accidents and cardiomyopathy with left ventricular hypertrophy as well as ejection fraction of 45% to 50%.  11. Tobacco as well as marijuana abuse.   DISCHARGE CONDITION: Stable.   DISCHARGE MEDICATIONS:  1. Resume metoprolol extended-release 50 mg p.o. daily. 2. Hydralazine 25 mg p.o. 3 times daily. 3. Minoxidil 2.5 mg 2 tablets twice daily. 4. Minitran 0.6 mg an hour transdermal film one patch transdermally once a day.  5. Pravastatin 20 mg p.o. at bedtime.  6. Aspirin 81 mg p.o. daily.  7. Amlodipine 10 mg p.o. daily.  8. Senokot 8.6 mg once at bedtime as needed.  9. Docusate sodium 100 mg p.o. twice daily as needed.  10. Lactulose 10 grams in 15 mL oral syrup 30 mL 3 times daily as needed. 11. Omeprazole 20 mg p.o. once daily.  12. Acetaminophen 325 mg 2 tablets every 4 hours as needed.  13. Ergocalciferol 50,000 international units which would be 1.25 mg once weekly.   NOTE: The patient is not to take ibuprofen or hydrochlorothiazide.   DIET: 2 grams salt, low fat, low cholesterol, mechanical soft.   ACTIVITY LIMITATIONS: As tolerated.   DISCHARGE FOLLOWUP: With prison MD in 2 days after discharge.  CONSULTANTS:  1. Munsoor Holley Raring, MD. 2. Lavonia Dana, MD. 3. Mali Smith,  MD. [place], MD.  RADIOLOGIC STUDIES: Right hand complete x-ray, 25th of January 2014, showed fracture of right second metacarpal. No significant displacement. Minimal distraction and angulation was noted.  Ultrasound of bilateral kidneys, 24th of January 2014, revealed increased echogenicity within the kidneys raising the concern of medical renal disease. No further sonographic abnormalities identified.  Ultrasound of abdomen, limited survey, right upper quadrant, on 26th of January 2014, revealed cholelithiasis with mobile gallstone demonstrated. No sonographic evidence of acute cholecystitis noted.  Echocardiogram, 26th of January 2014: Left ventricle grossly normal size. There is no thrombus. Left ventricular systolic function is mildly reduced, ejection fraction of 45% to 50%. There is moderate concentric left ventricular hypertrophy. There is mild global hypokinesis of the left ventricle. Right ventricular systolic function is borderline reduced.   HISTORY OF PRESENT ILLNESS: The patient is a 44 year old African American male with history of hypertension who is noncompliant with medications, including in prison, when he refused his blood pressure medication and brought back to the hospital and the 23rd of January 2014 with complaints of headaches as well as elevated blood glucose levels. Please refer to Dr. Serita Grit admission note on the 23rd of January 2014.   On arrival to the hospital, the patient vitals revealed that his blood pressure was 244/132, his pulse was 78, he was afebrile and his oxygen saturation was 99% on room air. Physical examination was unremarkable.   On the 23rd of January 2014, the patient's BUN and creatinine were 45 and 4.58 initially, with estimated GFR for African American would be 17. Most recent  creatinine level was noted to be on the 15th of January 2014. At that time creatinine level was 3.20. The patient's liver enzymes were unremarkable. The patient's  cardiac enzymes revealed mild elevation of CK total of 434, on the first set, 368 on the second set, and 359 on the third set. MB fraction was within normal limits at 2.2, on all three sets. Troponin was slightly elevated at 0.06 on the first set, 0.07 on the second and 0.06 on the third set. On the patient's CBC, white blood cell count was 8.5, hemoglobin was 13.9 and platelet count 305. The patient's urinalysis revealed straw-clear urine, negative for glucose, bilirubin or ketones, specific gravity was 1.009, pH was 5.0, negative for blood, 100 mg/dL protein, negative for nitrites or leukocyte esterase, less than 1 red blood cell, 1 white blood cells, no bacteria was seen and less than 1 epithelial cell as well as mucus was present.  HOSPITAL COURSE: The patient was admitted to the hospital. He initially was started on IV fluids due to his acute on chronic renal failure. He was also started on his blood pressure medications with attempt to control his blood pressure. He unfortunately would get into arguments very quickly and he would be upset and refuse his medications even while in the hospital despite our encouragement and concerns about his wellbeing. We consulted nephrologist, Dr. Holley Raring, as well as Dr. Candiss Norse who saw the patient in consultation who took some studies for his kidney function abnormalities and he was noted to have intact parathyroid hormone high at 104. The patient's antiglomerular based membrane study was 6 units, which was negative. The patient's ANCA testing was negative. The patient's osmolality in urine was less than 5%, which was negative. The patient's ANA was positive and high level is 19. The patient's hepatitis panel, ABC, was nonreactive. Hepatitis C virus antibody screen was consistent with no hepatitis C infection. Immunoelectrophoresis in serum showed no M spike. Protein electrophoresis in urine, random, also showed no M spike. The patient's vitamin D 25-hydroxy level was low at  9.8. Because of his renal failure he also had urine sodium level random taken which was 54. The patient's urine creatinine were 68.9 and protein/creatinine ratio was found to be 421, which is elevated, and protein random in urine level was 29. The nephrologist felt that the patient has acute on chronic renal failure, stage IV, and appeared to have long-standing renal dysfunction likely due to malignant hypertension as well as ibuprofen use. Renal ultrasound was consistent with CKD IV, but no evidence of renal arterial stenosis was noted. In regards to malignant hypertension, the patient was recommended to continue amlodipine as well as hydralazine and to discontinue HCTZ. Hold also ACE inhibitor as well as ARBs until renal function is at baseline. The patient was initiated back on those medications and his blood pressure somewhat improved; however, at that point the patient was started on minoxidil and his dose was advanced. On day of the discharge, the patient's blood pressure is ranging from 165 to [id number] to A999333 systolic, but otherwise his vitals are stable with temperature of 98, pulse 66, respiration rate was 20 and oxygen saturation was 97% on room air at rest. It was felt that although the patient's blood pressure is not well controlled yet, he is ready to discharged to jail as it was very difficult for [place] to work with him because of his significant reluctance to comply with medications. The patient was evaluated by Dr. Clayborn Bigness, cardiologist, who recommended  just medical therapy at this point but no further investigation.   The patient was complaining of right hand pain. In the past it was known that he had problems with bone fractures in that right hand. Consultation with Dr. Mali Smith was obtained. Dr. Tamala Julian however discussed this with the patient and recommended to get medical clearance first before further discussions about timing of any surgical intervention. He felt that the patient would need at  least echocardiogram and possibly even stress testing before this elective procedure. The patient is to follow up with orthopedic surgeon as outpatient. No further investigations were done. The patient had an echocardiogram done which showed cardiomyopathy with ejection fraction of 45% to 50% as well as LVH and global hypokinesis of left ventricle as well as reduced right ventricular systolic function. The patient would benefit from stress test at least prior to surgery.  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   The patient's lipid panel was checked and lipid panel revealed marked abnormalities. The patient's LDL was found to be 160. He was started on Pravachol. It is recommended to follow the patient's lipid panel as well as liver function tests as outpatient to make decisions about further advancement of his medications if needed. Of note, the patient's total cholesterol level was also very high at 262, triglycerides were markedly elevated at 323 and HDL was low at 29. The patient was felt to be at risk of stroke as well as cardiac event overall.   The patient was complaining of some right upper quadrant abdominal discomfort. Ultrasound of his right upper quadrant showed some gallstones, but no inflammatory disease. It was felt that the patient may have some biliary dyskinesis, but certainly there was no obstruction noted so for this reason he did not require any surgical intervention.   The patient was counseled about tobacco abuse and nicotine replacement therapy was recommended. The patient was offered nicotine inhaler. He is follow up with his primary care physician to make decisions about smoking cessation as outpatient.   The patient is being discharged in stable condition with the above-mentioned  medications and followup.   Of note, the patient was evaluated by a psychiatrist, Dr. Franchot Mimes, on the 26th of January 2014, because of his poor compliance. However, there were no other recommendations were made, except Dr. Franchot Mimes felt that the patient was cooperative and he had some conflict with his parole officer and that is the reason he did not take his medications. The patient had very similar kind of presentations and problems in jail as well.   TIME SPENT: 40 minutes.  ____________________________ Theodoro Grist, MD rv:sb D: 03/28/2012 19:27:40 ET T: 03/29/2012 09:11:20 ET JOB#: [id number]  cc: Theodoro Grist, MD, <Dictator> [place] MD ELECTRONICALLY SIGNED 04/21/2012 17:25

## 2014-06-22 NOTE — Consult Note (Signed)
PATIENT NAME:  Fernando Salas, Fernando Salas MR#:  [id number] DATE OF BIRTH:  [birth year]  DATE OF CONSULTATION:  03/27/2012  REFERRING PHYSICIAN:   CONSULTING PHYSICIAN:  Chariah Bailey K. Salihah Peckham, MD  SUBJECTIVE:  Patient was asked to be seen in consultation because he is refusing medications for high blood pressure.  According to the staff nurse, he has been taking medications for her during the shift, when patient was seen in consultation in the room along with the officers.  The patient stated that this was not the nurse that was the problem and not the hospital that was the problem, but the previous officer with whom he could not get along.  Currently, he likes the officer that is with him in the room and he has been taking his medications.  The patient is a 44 year old African American male who is not employed and he is on disability for status post MI.  Patient is divorced for more than 10 years and lives with his mother, who is in her 61's.    PAST PSYCHIATRIC HISTORY:  No previous history of inpatient psychiatry.  (Dictation Anomaly) not being followed by any psychiatrist.    ALCOHOL AND DRUGS:  Admits that he smokes marijuana whenever he can get it.  Does admit smoking cigarettes at the rate of  pack a day.    MENTAL STATUS:  The patient is dressed in hospital clothes, alert and oriented to place, person and time, pleasant and cooperative.  No agitation.  Affect is bright and cheerful.  He denies feeling depressed.  He denies feeling hopeless, helpless.  Denies feeling worthless. No psychosis.  Cognition is intact.  He is aware of the situation that brought him for admission to the hospital.  He denies suicidal, homicidal plans.  Insight and judgement is fair.    RECOMMENDATIONS:  The patient is taking his medication and he did have conflict with the previous officer and is currently cooperative.     ____________________________ Wallace Cullens. Franchot Mimes, MD skc:cc D: 03/27/2012 20:27:14 ET T: 03/27/2012 21:15:26  ET JOB#: [id number]  cc: Arlyn Leak K. Franchot Mimes, MD, <Dictator> Dewain Penning MD ELECTRONICALLY SIGNED 04/02/2012 17:54

## 2014-06-22 NOTE — Consult Note (Signed)
Chief Complaint:   Subjective/Chief Complaint Bp better now, Pain in thorax improved. No syncope no cp   VITAL SIGNS/ANCILLARY NOTES: **Vital Signs.:   26-Jan-14 11:48   Vital Signs Type Routine   Temperature Temperature (F) 97.3   Celsius 36.2   Pulse Pulse 61   Respirations Respirations 20   Systolic BP Systolic BP 741   Diastolic BP (mmHg) Diastolic BP (mmHg) 287   Mean BP 136   Pulse Ox % Pulse Ox % 91   Pulse Ox Activity Level  At rest   Oxygen Delivery Room Air/ 21 %  *Intake and Output.:   26-Jan-14 18:15   Grand Totals Intake:  240 Output:  400    Net:  -160 24 Hr.:  -960   Oral Intake      In:  240   Percentage of Meal Eaten  100   Urine ml     Out:  400   Urinary Method  Urinal   Stool  2 more stool per patient today   Brief Assessment:   Cardiac Regular  murmur present  -- carotid bruits  -- LE edema  -- JVD    Respiratory normal resp effort  clear BS  no use of accessory muscles    Gastrointestinal Normal    Gastrointestinal details normal Soft  Nontender  Nondistended   Lab Results: Cardiology:  26-Jan-14 12:53    Echo Doppler  Interpretation Summary   The left ventricle is grossly normal size. There is no thrombus. Left  ventricular systolic function is mildly reduced. Ejection Fraction =  45-50%. There is moderate concentric left ventricular hypertrophy.  There is mild global hypokinesis of the left ventricle. The right  ventricular systolic function is borderline reduced.   PatientHeight: 180 cm   PatientWeight: 867 kg   SystolicPressure: 672 mmHg   DiastolicPressure: 094 mmHg   HeartRate: 70 bpm   BSA: 2.3 m2  Procedure:   A two-dimensional transthoracic echocardiogram with color flow and  Doppler was performed.  Left Ventricle   Left ventricular systolic function is mildly reduced.   Ejection Fraction = 45-50%.   There is mild global hypokinesis of the left ventricle.   The left ventricle is grossly normal size.   There is no  thrombus.   There is moderate concentric left ventricular hypertrophy.  Right Ventricle   Borderline right ventricular enlargement.   There is normal rightventricular wall thickness.   The right ventricular systolic function is borderline reduced.  Atria   Borderline left atrial enlargement.   Borderline right atrial enlargement.  Mitral Valve   The mitral valve leaflets appear thickened, but open well.   There is trace mitral regurgitation.  Tricuspid Valve   The tricuspid valve is not well visualized, but is grossly normal.   There is mild tricuspid regurgitation.  Aortic Valve   The aortic valve is normal in structure and function.   No aortic regurgitation is present.  Pulmonic Valve   The pulmonic valve is not well seen, but is grossly normal.   There is no pulmonic valvular regurgitation.  Great Vessels   The aortic root is not well visualized but is probably normal size.  Pericardium/Pleural   There is no pleural effusion.   No pericardial effusion.  MMode 2D Measurements and Calculations   IVSd: 2.3 cm   LVIDd: 4.4 cm   LVIDs: 3.3 cm   LVPWd: 1.5 cm   FS: 24 %   EF(Teich): 49 %   Ao root diam:  3.7 cm   LA dimension: 4.0 cm   LVOT diam: 2.5 cm   LVLd ap4: 10 cm   EDV(MOD-sp4): 236 ml   LVLs ap4: 8.7 cm   ESV(MOD-sp4): 138 ml   EF(MOD-sp4): 42 %   SV(MOD-sp4): 98 ml  Doppler Measurements and Calculations   MV E point: 45 cm/sec   MVA point: 60 cm/sec   MV E/A: 0.75    MV dec time: 0.24 sec   Ao V2 max: 157 cm/sec   Ao max PG: 10 mmHg   AVA(V,D): 3.8 cm2   LV max PG: 6.0 mmHg   LV V1 max: 122 cm/sec   PA V2 max: 107 cm/sec   PA max PG: 5.0 mmHg   RAP systole: 5.0 mmHg  Reading Physician: Lujean Amel  Sonographer: Dondra Spry Interpreting Physician:  Lujean Amel,  electronically signed on  03-27-2012 19:26:50 Requesting Physician: Lujean Amel  Routine Chem:  26-Jan-14 03:37    Glucose, Serum 69   BUN  41   Creatinine (comp)   3.64   Sodium, Serum 139   Potassium, Serum 3.7   Chloride, Serum 106   CO2, Serum 23   Calcium (Total), Serum 8.9   Anion Gap 10   Osmolality (calc) 286   eGFR (African American)  23   eGFR (Non-African American)  20 (eGFR values <36m/min/1.73 m2 may be an indication of chronic kidney disease (CKD). Calculated eGFR is useful in patients with stable renal function. The eGFR calculation will not be reliable in acutely ill patients when serum creatinine is changing rapidly. It is not useful in  patients on dialysis. The eGFR calculation may not be applicable to patients at the low and high extremes of body sizes, pregnant women, and vegetarians.)   Cholesterol, Serum  262   Triglycerides, Serum  323   HDL (INHOUSE)  29   VLDL Cholesterol Calculated  65   LDL Cholesterol Calculated  168 (Result(s) reported on 27 Mar 2012 at 04:36AM.)   Radiology Results: XRay:    25-Jan-14 14:19, Hand Right Complete   Hand Right Complete    REASON FOR EXAM:    swelling  COMMENTS:       PROCEDURE: DXR - DXR HAND RT COMPLETE W/OBLIQUES  - Mar 26 2012  2:19PM     RESULT: Images of the right he and demonstrate fracture in the distal   right second metacarpal in the metacarpal neck/head region which is   unchanged compared previous exam. This extends to the articular surface.   There is accompanying soft tissue swelling.    IMPRESSION:   1. Fracture in the distal right second metacarpal. No significant   displacement. Minimal distractionand angulation.    Dictation Site: 1    Verified By: GSundra Aland M.D., MD  [place]    24-Jan-14 14:22, [place]   [place]    REASON FOR EXAM:    ARF, right flank pain  COMMENTS:       PROCEDURE: [place] - [place]  - Mar 25 2012  2:22PM     RESULT:     Findings: The right kidney measures 10.98 x 5.76 x 6.81 cm and the left   10.49 x 5.25 x 5.19 cm. There is increased corticalechogenicity with   decreased corticomedullary  differentiation. There is no evidence of   hydronephrosis. The urinary bladder is distended. Bilateral ureteral jets   are appreciated.    IMPRESSION:  Increased echogenicity within the kidneys raising the  concern of medical renal disease. No further sonographic abnormalities     identified.       Thank you for this opportunity to contribute to the care of your patient.         Verified By: Mikki Santee, M.D., MD    26-Jan-14 09:24, US Abdomen Limited Survey   US Abdomen Limited Survey    REASON FOR EXAM:    RUQ pain on palpation  COMMENTS:   Body Site: GB and Fossa, CBD, Head of Pancreas    PROCEDURE: [place]  - US ABDOMEN LIMITED SURVEY  - Mar 27 2012  9:24AM     RESULT: Limited abdominal sonogram is performed. The pancreas could not   be visualized. Portal venous flow is normal. A cholelithiasis is present   with at least one large stone measuring over 9 mm present. This appears   mobile with changes in patient position. The gallbladder wall thickness   is normal at 2.2 mm. There isa negative sonographic Murphy's sign. No   ascites is evident. The common bile duct diameter is 4.9 mm.    IMPRESSION:   1. Cholelithiasis with mobile gallstones demonstrated. No sonographic   evidence of acute cholecystitis.  Dictation Site: 6        Verified By: Sundra Aland, M.D., MD  Cardiology:    26-Jan-14 12:53, Echo Doppler   Echo Doppler    Interpretation Summary    The left ventricle is grossly normal size. There is no thrombus. Left   ventricular systolic function is mildly reduced. Ejection Fraction =   45-50%. There is moderate concentric left ventricular hypertrophy.   There is mild global hypokinesis of the left ventricle. The right   ventricular systolic function is borderline reduced.    PatientHeight: 180 cm    PatientWeight: 333 kg    SystolicPressure: 545 mmHg    DiastolicPressure: 625 mmHg    HeartRate: 70 bpm    BSA: 2.3 m2    Procedure:    A  two-dimensional transthoracic echocardiogram with color flow and   Doppler was performed.    Left Ventricle    Left ventricular systolic function is mildly reduced.    Ejection Fraction = 45-50%.    There is mild global hypokinesis of the left ventricle.    The left ventricle is grossly normal size.    There is no thrombus.    There is moderate concentric left ventricular hypertrophy.    Right Ventricle    Borderline right ventricular enlargement.    There is normal rightventricular wall thickness.    The right ventricular systolic function is borderline reduced.    Atria    Borderline left atrial enlargement.    Borderline right atrial enlargement.    Mitral Valve    The mitral valve leaflets appear thickened, but open well.    There is trace mitral regurgitation.    Tricuspid Valve    The tricuspid valve is not well visualized, but is grossly normal.    There is mild tricuspid regurgitation.    Aortic Valve    The aortic valve is normal in structure and function.    No aortic regurgitation is present.    Pulmonic Valve    The pulmonic valve is not well seen, but is grossly normal.    There is no pulmonic valvular regurgitation.    Great Vessels    The aortic root is not well visualized but is probably normal size.    Pericardium/Pleural  There is no pleural effusion.    No pericardial effusion.    MMode 2D Measurements and Calculations    IVSd: 2.3 cm    LVIDd: 4.4 cm    LVIDs: 3.3 cm    LVPWd: 1.5 cm    FS: 24 %    EF(Teich): 49 %    Ao root diam: 3.7 cm    LA dimension: 4.0 cm    LVOT diam: 2.5 cm    LVLd ap4: 10 cm    EDV(MOD-sp4): 236 ml    LVLs ap4: 8.7 cm    ESV(MOD-sp4): 138 ml    EF(MOD-sp4): 42 %    SV(MOD-sp4): 98 ml    Doppler Measurements and Calculations    MV E point: 45 cm/sec    MVA point: 60 cm/sec    MV E/A: 0.75     MV dec time: 0.24 sec    Ao V2 max: 157 cm/sec    Ao max PG: 10 mmHg    AVA(V,D): 3.8 cm2    LV max PG: 6.0  mmHg    LV V1 max: 122 cm/sec    PA V2 max: 107 cm/sec    PA max PG: 5.0 mmHg    RAP systole: 5.0 mmHg    Reading Physician: Lujean Amel   Sonographer: Dondra Spry  Interpreting Physician:  Lujean Amel,  electronically signed on   03-27-2012 19:26:50  Requesting Physician: Lujean Amel   Assessment/Plan:  Invasive Device Daily Assessment of Necessity:   Does the patient currently have any of the following indwelling devices? none   Assessment/Plan:   Assessment IMP Malignant HTN CM Obesity AFR/CRF Abn EKG CAD Hyperlipidemia .    Plan PLAN Continue tele Bp control as necessary Hydralazine/Imdur B-blocker for bp Wgt loss and exerise Advise to quit smoking Agree with nephology w/u Medical therapy  for now   Electronic Signatures: Lujean Amel D (MD)  (Signed 26-Jan-14 22:46)  Authored: Chief Complaint, VITAL SIGNS/ANCILLARY NOTES, Brief Assessment, Lab Results, Radiology Results, Assessment/Plan   Last Updated: 26-Jan-14 22:46 by Lujean Amel D (MD)

## 2014-06-23 NOTE — Op Note (Signed)
PATIENT NAME:  Fernando Salas, Fernando Salas MR#:  [id number] DATE OF BIRTH:  [birth year]  DATE OF PROCEDURE:  01/24/2014  PREOPERATIVE DIAGNOSES: 1.  Stage IV renal insufficiency.  2.  Malignant hypertension.  3.  Coronary artery disease.   POSTOPERATIVE DIAGNOSES:  1.  Stage IV renal insufficiency.  2.  Malignant hypertension.  3.  Coronary artery disease.   PROCEDURE PERFORMED: Creation of a left arm brachiocephalic fistula.   SURGEON: Katha Cabal, MD   ANESTHESIA: General by LMA.   FLUIDS: Per anesthesia record.   ESTIMATED BLOOD LOSS: Minimal.   SPECIMEN: None.   INDICATIONS: Mr. Hoerig is a 44 year old gentleman who was sent to the office secondary to worsening renal function in preparation for future dialysis. He is undergoing creation of an upper arm access. Risks and benefits were reviewed. All questions answered. The patient agrees to proceed.   DESCRIPTION OF PROCEDURE: The patient is taken to the Operating Room and placed in the supine position. After adequate general anesthesia is induced and appropriate invasive monitors are placed, he is positioned supine. His left arm is then extended palm upward, prepped and draped in sterile fashion. Appropriate timeout is called.   With induction of anesthesia, the veins of his arms have dilated significantly and there is now a readily visible very large cephalic vein. Given the significantly improved access in the brachiocephalic distribution compared with a basilic transposition and the fact that a basilic transposition can always be done in the future as his next access, the option for brachiocephalic fistula is selected. A small transverse incision is made in the antecubital crease and the dissection is carried down. The vein is skeletonized. It is then marked and transected using [facility] coronary dilators. It dilates to 4.5 mm quite easily.   It is flushed with heparinized saline and a bulldog is placed proximally. The brachial artery is  then exposed and looped proximally and distally with Silastic vessel loops.   Arteriotomy is then made with an 11 blade scalpel, extended with Potts scissors, 6-0 Prolene stay sutures are placed, and an end vein to side brachial artery anastomosis was fashioned with running 6-0 Prolene. Flushing maneuvers are performed and flow was re-established first to the fistula and then to the hand, 2+ radial pulse is maintained, excellent thrill is noted in the fistula.   The wound is then irrigated and closed in layers using interrupted 3-0 Vicryl, followed by 4-0 Monocryl subcuticular and Dermabond. The patient tolerated the procedure well and there were no immediate complications.    ____________________________ Katha Cabal, MD ggs:at D: 01/24/2014 09:05:08 ET T: 01/24/2014 12:38:33 ET JOB#: [id number]  cc: Katha Cabal, MD, <Dictator> [place] Lilian Kapur, MD Katha Cabal MD ELECTRONICALLY SIGNED 01/30/2014 13:01

## 2014-06-26 DIAGNOSIS — Z0279 Encounter for issue of other medical certificate: Secondary | ICD-10-CM | POA: Diagnosis not present

## 2014-08-29 ENCOUNTER — Emergency Department: Payer: Medicaid Other

## 2014-08-29 ENCOUNTER — Emergency Department
Admission: EM | Admit: 2014-08-29 | Discharge: 2014-08-29 | Disposition: A | Discharge status: Home / Self Care | Payer: Medicaid Other | Attending: Emergency Medicine | Admitting: Emergency Medicine

## 2014-08-29 ENCOUNTER — Encounter: Payer: Self-pay | Admitting: Emergency Medicine

## 2014-08-29 DIAGNOSIS — I129 Hypertensive chronic kidney disease with stage 1 through stage 4 chronic kidney disease, or unspecified chronic kidney disease: Secondary | ICD-10-CM | POA: Insufficient documentation

## 2014-08-29 DIAGNOSIS — Z7982 Long term (current) use of aspirin: Secondary | ICD-10-CM | POA: Insufficient documentation

## 2014-08-29 DIAGNOSIS — Z79899 Other long term (current) drug therapy: Secondary | ICD-10-CM | POA: Insufficient documentation

## 2014-08-29 DIAGNOSIS — N189 Chronic kidney disease, unspecified: Secondary | ICD-10-CM | POA: Diagnosis not present

## 2014-08-29 DIAGNOSIS — S3992XA Unspecified injury of lower back, initial encounter: Secondary | ICD-10-CM | POA: Diagnosis not present

## 2014-08-29 DIAGNOSIS — Y92410 Unspecified street and highway as the place of occurrence of the external cause: Secondary | ICD-10-CM | POA: Diagnosis not present

## 2014-08-29 DIAGNOSIS — Y9389 Activity, other specified: Secondary | ICD-10-CM | POA: Diagnosis not present

## 2014-08-29 DIAGNOSIS — Z720 Tobacco use: Secondary | ICD-10-CM | POA: Diagnosis not present

## 2014-08-29 DIAGNOSIS — Y998 Other external cause status: Secondary | ICD-10-CM | POA: Diagnosis not present

## 2014-08-29 MED ORDER — HYDROCODONE-ACETAMINOPHEN 5-325 MG PO TABS: 2.0000 | ORAL_TABLET | Freq: Once | ORAL | Status: DC

## 2014-08-29 MED ORDER — CYCLOBENZAPRINE HCL 10 MG PO TABS: 10.0000 mg | ORAL_TABLET | Freq: Once | ORAL | Status: DC

## 2014-08-29 NOTE — Discharge Instructions (Signed)

## 2014-08-29 NOTE — ED Notes (Signed)
Pt came out into hall requesting to leave. Pt given d/c papers and shown way to lobby. Pt reports ride to be on their way.
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
## 2014-11-19 ENCOUNTER — Encounter: Payer: Self-pay | Admitting: General Surgery

## 2014-12-03 ENCOUNTER — Ambulatory Visit
Admission: RE | Admit: 2014-12-03 | Discharge: 2014-12-03 | Disposition: A | Discharge status: Home / Self Care | Payer: Commercial Managed Care - HMO | Source: Ambulatory Visit | Attending: General Surgery | Admitting: General Surgery

## 2014-12-03 ENCOUNTER — Encounter: Payer: Self-pay | Admitting: General Surgery

## 2014-12-03 ENCOUNTER — Ambulatory Visit (INDEPENDENT_AMBULATORY_CARE_PROVIDER_SITE_OTHER): Payer: Medicare HMO | Admitting: General Surgery

## 2014-12-03 VITALS — HR 80 | Resp 12 | Ht 74.0 in | Wt 230.0 lb

## 2014-12-03 DIAGNOSIS — M7989 Other specified soft tissue disorders: Secondary | ICD-10-CM

## 2014-12-03 DIAGNOSIS — R1900 Intra-abdominal and pelvic swelling, mass and lump, unspecified site: Secondary | ICD-10-CM

## 2014-12-03 DIAGNOSIS — R1909 Other intra-abdominal and pelvic swelling, mass and lump: Secondary | ICD-10-CM

## 2014-12-03 NOTE — Progress Notes (Signed)
Patient ID: Fernando Salas, male   DOB: 11/08/1970, 44 y.o.   MRN: [id number]  Chief Complaint  Patient presents with  . Other    inguinal hernia    HPI Fernando Salas is a 44 y.o. male here today for an evaluation of bilateral inguinal hernia. Patient states they have been there for over 25 years. He states both sides are popping in and out. Pain with activity and when the areas are swollen. HPIAlso he is hobbling some due to recent right foot injury-has not had it xrayed. Pt has ESRD and has a left arm fistula in preparation for dialysis.  Past Medical History  Diagnosis Date  . Chronic kidney disease   . Hypertension   . Stroke ([place])   . COPD (chronic obstructive pulmonary disease) ([place])   . Hyperlipidemia   . CAD (coronary artery disease)     status post multiple myocardial infarctions  . Proteinuria   . Secondary hyperparathyroidism of renal origin ([place])   . Shortness of breath dyspnea     " when I drink a cup of cold water too fast"  . Myocardial infarction ([place])     " I had 13 heart attacks, 8 massive"  . Sleep apnea     pt stated " long story" does not wear CPAP    Past Surgical History  Procedure Laterality Date  . Av fistula placement Left 01-24-14    By Dr. Hortencia Pilar in [place]  . Coronary stent placement      13 stents total  . Cardiac catheterization    . Av fistula placement Right 04/30/2014    Procedure: RIGHT BRACHIOCEPHALIC ARTERIOVENOUS (AV) FISTULA CREATION;  Surgeon: Elam Dutch, MD;  Location: [place];  Service: Vascular;  Laterality: Right;  . Av fistula placement Left 05/31/2014    Procedure: Left INSERTION OF ARTERIOVENOUS (AV) GORE-TEX GRAFT ARM;  Surgeon: Elam Dutch, MD;  Location: [facility] OR;  Service: Vascular;  Laterality: Left;    Family History  Problem Relation Age of Onset  . Heart disease Mother   . Kidney disease Father   . Kidney disease Brother   . Hyperlipidemia Mother   . Hyperlipidemia Mother   . Hyperlipidemia Sister    . Hyperlipidemia Daughter   . Hypertension Mother   . Hypertension Sister   . Hypertension Daughter     Social History Social History  Substance Use Topics  . Smoking status: Current Every Day Smoker -- 0.50 packs/day for 25 years    Types: Cigarettes  . Smokeless tobacco: Never Used  . Alcohol Use: 0.0 oz/week    0 Standard drinks or equivalent per week     Comment: occasional    Allergies  Allergen Reactions  . Ibuprofen Other (See Comments)    Kidney   . Viagra [Sildenafil]     Lips swell   . Clonidine Derivatives Rash    Current Outpatient Prescriptions  Medication Sig Dispense Refill  . amLODipine (NORVASC) 10 MG tablet Take 10 mg by mouth daily.    Marland Kitchen aspirin 81 MG tablet Take 81 mg by mouth daily.    Marland Kitchen atorvastatin (LIPITOR) 20 MG tablet Take 20 mg by mouth daily.    . carvedilol (COREG) 25 MG tablet Take 25 mg by mouth 2 (two) times daily.  5  . chlorthalidone (HYGROTON) 25 MG tablet Take 25 mg by mouth daily.   3  . enalapril (VASOTEC) 10 MG tablet Take 10 mg by mouth daily.    Marland Kitchen  fluticasone (FLONASE) 50 MCG/ACT nasal spray Place into both nostrils daily.    Marland Kitchen HYDROcodone-acetaminophen (NORCO/VICODIN) 5-325 MG per tablet Take 1 tablet by mouth every 6 (six) hours as needed for moderate pain. 30 tablet 0  . nitroGLYCERIN (NITROSTAT) 0.4 MG SL tablet Place 0.4 mg under the tongue every 5 (five) minutes as needed for chest pain.    . traMADol (ULTRAM) 50 MG tablet Take 1 tablet (50 mg total) by mouth every 6 (six) hours as needed. (Patient taking differently: Take 50 mg by mouth every 6 (six) hours as needed for moderate pain. ) 20 tablet 0   No current facility-administered medications for this visit.    Review of Systems Review of Systems  Constitutional: Negative.   Respiratory: Negative.   Cardiovascular: Negative.     Pulse 80, resp. rate 12, height $RemoveBe'6\' 2"'JmpjnvHQY$  (1.88 m), weight 230 lb (104.327 kg).  Physical Exam Physical Exam  Constitutional: He appears  well-developed and well-nourished.  Eyes: Conjunctivae are normal. No scleral icterus.  Neck: Neck supple.  Cardiovascular: Normal rate, regular rhythm and normal heart sounds.   Pulmonary/Chest: Effort normal and breath sounds normal.  Abdominal: Soft. Normal appearance and bowel sounds are normal. There is no tenderness. No hernia.  Lymphadenopathy:    He has no cervical adenopathy.  Skin: Skin is warm and dry.  Exam of the groins/inguinal region showed no hernia. Scrotal areas likewise with no findings. However, there is a very prominent bony prominence in medial left groin-not connected to the pubis.   Data Reviewed Note reviewed  Assessment    History of bilateral groin swelling-none noted on exam.   Recent right foot injury-swollen dorsum.  Plan   Pt advised on findings. This patient is to have an x-ray of right foot today.  Patient to be scheduled for a CT abd/pelvis WO contrast at [facility] for 12-10-14 at 10 am (arrive 9:45 am). Prep: NPO 4 hours prior and pick up prep kit.      PCP:  Carollee Herter 12/05/2014, 8:02 AM

## 2014-12-03 NOTE — Patient Instructions (Addendum)
Follow up appointment to be announced.  This patient is to have an x-ray of right foot today.  Patient to be scheduled for a cat scan abd/pelvis WO contrast at [facility] for 12-10-14 at 10 am (arrive 9:45 am). Prep: NPO 4 hours prior and pick up prep kit.  

## 2014-12-05 ENCOUNTER — Encounter: Payer: Self-pay | Admitting: General Surgery

## 2014-12-10 ENCOUNTER — Ambulatory Visit: Payer: Medicare HMO

## 2014-12-10 ENCOUNTER — Ambulatory Visit
Admission: RE | Admit: 2014-12-10 | Discharge: 2014-12-10 | Disposition: A | Discharge status: Home / Self Care | Payer: Medicare HMO | Source: Ambulatory Visit | Attending: General Surgery | Admitting: General Surgery

## 2014-12-10 DIAGNOSIS — R1909 Other intra-abdominal and pelvic swelling, mass and lump: Secondary | ICD-10-CM

## 2014-12-10 DIAGNOSIS — R599 Enlarged lymph nodes, unspecified: Secondary | ICD-10-CM | POA: Insufficient documentation

## 2014-12-10 DIAGNOSIS — R1900 Intra-abdominal and pelvic swelling, mass and lump, unspecified site: Secondary | ICD-10-CM | POA: Diagnosis present

## 2014-12-10 HISTORY — DX: Disorder of kidney and ureter, unspecified: N28.9

## 2014-12-17 NOTE — Progress Notes (Signed)
Appointment scheduled for 12-27-14. This appointment was arranged with case worker due to transportation.

## 2014-12-27 ENCOUNTER — Ambulatory Visit (INDEPENDENT_AMBULATORY_CARE_PROVIDER_SITE_OTHER): Payer: Medicare HMO | Admitting: General Surgery

## 2014-12-27 ENCOUNTER — Encounter: Payer: Self-pay | Admitting: General Surgery

## 2014-12-27 VITALS — HR 82 | Resp 12 | Ht 74.0 in | Wt 232.0 lb

## 2014-12-27 DIAGNOSIS — R1900 Intra-abdominal and pelvic swelling, mass and lump, unspecified site: Secondary | ICD-10-CM | POA: Diagnosis not present

## 2014-12-27 DIAGNOSIS — R1909 Other intra-abdominal and pelvic swelling, mass and lump: Secondary | ICD-10-CM

## 2014-12-27 NOTE — Progress Notes (Signed)
This is a 44 year old male here today for his follow up ct scan done on 12/10/14. Past history of wrestling in adolescence. This is when the pain first began. He has a Teacher, adult education and garden business, and heavy lifting for work causes pain and swelling in this area that interferes with work.   Recent CT abdomen/pelvis reviewed, swollen inguinal lymph nodes suggest possible infection. No hernias were detected, but a bony protrusion is visible on left side of pelvis that could indicate an old injury.   Plan is to observe for now, does not appear urgently concerning at this time. Instructed patient to make a note if he notices any bulging mass that can be pushed back in. Will reassess in the future if persistent pain.  I have reviewed the history of present illness with the patient.

## 2014-12-27 NOTE — Patient Instructions (Signed)
Call if any concerns arise.

## 2016-01-22 ENCOUNTER — Encounter: Payer: Self-pay | Admitting: General Surgery

## 2016-01-22 ENCOUNTER — Ambulatory Visit (INDEPENDENT_AMBULATORY_CARE_PROVIDER_SITE_OTHER): Payer: Medicare HMO | Admitting: General Surgery

## 2016-01-22 VITALS — HR 64 | Resp 16 | Ht 72.0 in | Wt 216.0 lb

## 2016-01-22 DIAGNOSIS — R1909 Other intra-abdominal and pelvic swelling, mass and lump: Secondary | ICD-10-CM | POA: Diagnosis not present

## 2016-01-22 NOTE — Progress Notes (Signed)
Patient ID: Fernando Salas, male   DOB: 1970-07-08, 45 y.o.   MRN: [id number]  Chief Complaint  Patient presents with  . Hernia    HPI Fernando Salas is a 45 y.o. male.  Patient here today for an evaluation of a possible left inguinal hernia.  States that they have noticed it since last year when he was evaluated.  It does seem to be causing some abdominal pain.  No nausea, vomiting, constipation or diarrhea noted. CT scan was 12-10-14. He is a dialysis patient but he states he does not currently take treatments. No blood pressure was taking due to bilateral arm fistulas. I have reviewed the history of present illness with the patient.  HPI  Past Medical History:  Diagnosis Date  . CAD (coronary artery disease)    status post multiple myocardial infarctions  . Chronic kidney disease   . COPD (chronic obstructive pulmonary disease) ([place])   . Hyperlipidemia   . Hypertension   . Myocardial infarction    " I had 13 heart attacks, 8 massive"  . Proteinuria   . Renal insufficiency   . Secondary hyperparathyroidism of renal origin ([place])   . Shortness of breath dyspnea    " when I drink a cup of cold water too fast"  . Sleep apnea    pt stated " long story" does not wear CPAP  . Stroke [facility])     Past Surgical History:  Procedure Laterality Date  . AV FISTULA PLACEMENT Left 01-24-14   By Dr. Hortencia Pilar in [place]  . AV FISTULA PLACEMENT Right 04/30/2014   Procedure: RIGHT BRACHIOCEPHALIC ARTERIOVENOUS (AV) FISTULA CREATION;  Surgeon: Elam Dutch, MD;  Location: [facility] OR;  Service: Vascular;  Laterality: Right;  . AV FISTULA PLACEMENT Left 05/31/2014   Procedure: Left INSERTION OF ARTERIOVENOUS (AV) GORE-TEX GRAFT ARM;  Surgeon: Elam Dutch, MD;  Location: [place];  Service: Vascular;  Laterality: Left;  . CARDIAC CATHETERIZATION    . CORONARY STENT PLACEMENT     13 stents total    Family History  Problem Relation Age of Onset  . Heart disease Mother   .  Hyperlipidemia Mother   . Hypertension Mother   . Kidney disease Father   . Kidney disease Brother   . Hyperlipidemia Sister   . Hyperlipidemia Daughter   . Hypertension Sister   . Hypertension Daughter     Social History Social History  Substance Use Topics  . Smoking status: Current Every Day Smoker    Packs/day: 0.50    Years: 25.00    Types: Cigarettes  . Smokeless tobacco: Never Used  . Alcohol use 0.0 oz/week     Comment: occasional    Allergies  Allergen Reactions  . Ibuprofen Other (See Comments)    Kidney   . Viagra [Sildenafil]     Lips swell   . Clonidine Derivatives Rash    Current Outpatient Prescriptions  Medication Sig Dispense Refill  . amLODipine (NORVASC) 10 MG tablet Take 10 mg by mouth daily.    Marland Kitchen atorvastatin (LIPITOR) 20 MG tablet Take 20 mg by mouth daily.    . carvedilol (COREG) 25 MG tablet Take 25 mg by mouth 2 (two) times daily.  5  . chlorthalidone (HYGROTON) 25 MG tablet Take 25 mg by mouth daily.   3  . [employer] NON-ASPIRIN EXTRA STRENGTH 500 MG tablet Take 1,000 mg by mouth every 6 (six) hours as needed. for pain  2  . enalapril (VASOTEC)  10 MG tablet Take 10 mg by mouth daily.    . fluticasone (FLONASE) 50 MCG/ACT nasal spray INSTILL 1 SPRAY PER NARES DAILY  2  . nitroGLYCERIN (NITROSTAT) 0.4 MG SL tablet Place 0.4 mg under the tongue every 5 (five) minutes as needed for chest pain.     No current facility-administered medications for this visit.     Review of Systems Review of Systems  Constitutional: Negative.   Respiratory: Negative.   Cardiovascular: Negative.     Pulse 64, resp. rate 16, height 6' (1.829 m), weight 216 lb (98 kg).  Physical Exam Physical Exam  Constitutional: He is oriented to person, place, and time. He appears well-developed and well-nourished.  Abdominal: Soft. Normal appearance and bowel sounds are normal. There is no tenderness. No hernia. Hernia confirmed negative in the right inguinal area and confirmed  negative in the left inguinal area.  prominence of bone left groin and upper thigh previously noted on CT scan, likely from old injury no hernia noted.  Neurological: He is alert and oriented to person, place, and time.  Skin: Skin is warm and dry.  Psychiatric: His behavior is normal.    Data Reviewed Progress notes.  Assessment    Prominence of bone left groin and upper thigh previously noted on CT scan, likely from old injury, no hernia noted.    Plan    Recommend orthopedic evaluation.  Patient has been scheduled for an appointment with Dr. Earnestine Leys for 01-27-16 at 9:30 am (arrive 9:15 am). This patient was instructed to take photo ID, insurance card, medications, and disc from CT scan completed in 2016 for physician review.      This information has been scribed by Karie Fetch RN, BSN,BC.   Grecia Lynk G 01/22/2016, 2:38 PM

## 2016-01-22 NOTE — Patient Instructions (Addendum)
The patient is aware to call back for any questions or concerns. Recommend orthopedic evaluation

## 2016-04-21 ENCOUNTER — Other Ambulatory Visit: Payer: Self-pay | Admitting: *Deleted

## 2016-04-21 DIAGNOSIS — N184 Chronic kidney disease, stage 4 (severe): Secondary | ICD-10-CM

## 2016-04-21 DIAGNOSIS — Z01810 Encounter for preprocedural cardiovascular examination: Secondary | ICD-10-CM

## 2016-05-01 ENCOUNTER — Encounter (HOSPITAL_COMMUNITY): Payer: Commercial Managed Care - HMO

## 2016-05-01 ENCOUNTER — Other Ambulatory Visit (HOSPITAL_COMMUNITY): Payer: Commercial Managed Care - HMO

## 2016-05-07 ENCOUNTER — Ambulatory Visit: Payer: Medicare HMO | Admitting: Vascular Surgery

## 2016-05-28 ENCOUNTER — Ambulatory Visit (HOSPITAL_COMMUNITY): Admission: RE | Admit: 2016-05-28 | Payer: Medicare HMO | Source: Ambulatory Visit

## 2016-05-28 ENCOUNTER — Ambulatory Visit (HOSPITAL_COMMUNITY): Payer: Medicare HMO | Attending: Vascular Surgery

## 2016-06-02 ENCOUNTER — Encounter: Payer: Self-pay | Admitting: Family

## 2016-06-10 ENCOUNTER — Ambulatory Visit: Payer: Medicare HMO | Admitting: Vascular Surgery

## 2016-10-31 ENCOUNTER — Emergency Department: Payer: Medicare Other

## 2016-10-31 ENCOUNTER — Inpatient Hospital Stay
Admission: EM | Admit: 2016-10-31 | Discharge: 2016-11-02 | DRG: 280 | Disposition: A | Discharge status: Home / Self Care | Payer: Medicare Other | Attending: Internal Medicine | Admitting: Internal Medicine

## 2016-10-31 ENCOUNTER — Encounter: Payer: Self-pay | Admitting: Emergency Medicine

## 2016-10-31 DIAGNOSIS — E785 Hyperlipidemia, unspecified: Secondary | ICD-10-CM | POA: Diagnosis present

## 2016-10-31 DIAGNOSIS — N2581 Secondary hyperparathyroidism of renal origin: Secondary | ICD-10-CM | POA: Diagnosis present

## 2016-10-31 DIAGNOSIS — J449 Chronic obstructive pulmonary disease, unspecified: Secondary | ICD-10-CM | POA: Diagnosis present

## 2016-10-31 DIAGNOSIS — Z886 Allergy status to analgesic agent status: Secondary | ICD-10-CM

## 2016-10-31 DIAGNOSIS — F17210 Nicotine dependence, cigarettes, uncomplicated: Secondary | ICD-10-CM | POA: Diagnosis present

## 2016-10-31 DIAGNOSIS — E872 Acidosis: Secondary | ICD-10-CM | POA: Diagnosis present

## 2016-10-31 DIAGNOSIS — I214 Non-ST elevation (NSTEMI) myocardial infarction: Secondary | ICD-10-CM | POA: Diagnosis not present

## 2016-10-31 DIAGNOSIS — I160 Hypertensive urgency: Secondary | ICD-10-CM | POA: Diagnosis present

## 2016-10-31 DIAGNOSIS — I120 Hypertensive chronic kidney disease with stage 5 chronic kidney disease or end stage renal disease: Secondary | ICD-10-CM | POA: Diagnosis present

## 2016-10-31 DIAGNOSIS — D631 Anemia in chronic kidney disease: Secondary | ICD-10-CM | POA: Diagnosis present

## 2016-10-31 DIAGNOSIS — I69351 Hemiplegia and hemiparesis following cerebral infarction affecting right dominant side: Secondary | ICD-10-CM

## 2016-10-31 DIAGNOSIS — Z9114 Patient's other noncompliance with medication regimen: Secondary | ICD-10-CM

## 2016-10-31 DIAGNOSIS — I252 Old myocardial infarction: Secondary | ICD-10-CM

## 2016-10-31 DIAGNOSIS — I200 Unstable angina: Secondary | ICD-10-CM | POA: Diagnosis not present

## 2016-10-31 DIAGNOSIS — Z79899 Other long term (current) drug therapy: Secondary | ICD-10-CM

## 2016-10-31 DIAGNOSIS — G4730 Sleep apnea, unspecified: Secondary | ICD-10-CM | POA: Diagnosis present

## 2016-10-31 DIAGNOSIS — Z5329 Procedure and treatment not carried out because of patient's decision for other reasons: Secondary | ICD-10-CM | POA: Diagnosis not present

## 2016-10-31 DIAGNOSIS — N186 End stage renal disease: Secondary | ICD-10-CM | POA: Diagnosis present

## 2016-10-31 DIAGNOSIS — Z955 Presence of coronary angioplasty implant and graft: Secondary | ICD-10-CM

## 2016-10-31 DIAGNOSIS — I25110 Atherosclerotic heart disease of native coronary artery with unstable angina pectoris: Secondary | ICD-10-CM | POA: Diagnosis present

## 2016-10-31 DIAGNOSIS — I10 Essential (primary) hypertension: Secondary | ICD-10-CM

## 2016-10-31 DIAGNOSIS — Z888 Allergy status to other drugs, medicaments and biological substances status: Secondary | ICD-10-CM

## 2016-10-31 DIAGNOSIS — Z992 Dependence on renal dialysis: Secondary | ICD-10-CM

## 2016-10-31 DIAGNOSIS — R079 Chest pain, unspecified: Secondary | ICD-10-CM

## 2016-10-31 LAB — CBC WITH DIFFERENTIAL/PLATELET
Basophils Absolute: 0 10*3/uL (ref 0–0.1)
Basophils Relative: 1 %
EOS ABS: 0.1 10*3/uL (ref 0–0.7)
EOS PCT: 2 %
HCT: 30.2 % — ABNORMAL LOW (ref 40.0–52.0)
Hemoglobin: 10 g/dL — ABNORMAL LOW (ref 13.0–18.0)
LYMPHS ABS: 1.2 10*3/uL (ref 1.0–3.6)
Lymphocytes Relative: 20 %
MCH: 31.1 pg (ref 26.0–34.0)
MCHC: 33.2 g/dL (ref 32.0–36.0)
MCV: 93.9 fL (ref 80.0–100.0)
MONOS PCT: 11 %
Monocytes Absolute: 0.6 10*3/uL (ref 0.2–1.0)
Neutro Abs: 3.8 10*3/uL (ref 1.4–6.5)
Neutrophils Relative %: 66 %
PLATELETS: 173 10*3/uL (ref 150–440)
RBC: 3.22 MIL/uL — ABNORMAL LOW (ref 4.40–5.90)
RDW: 14.3 % (ref 11.5–14.5)
WBC: 5.7 10*3/uL (ref 3.8–10.6)

## 2016-10-31 LAB — URINE DRUG SCREEN, QUALITATIVE (ARMC ONLY)
AMPHETAMINES, UR SCREEN: NOT DETECTED
BARBITURATES, UR SCREEN: NOT DETECTED
BENZODIAZEPINE, UR SCRN: NOT DETECTED
COCAINE METABOLITE, UR ~~LOC~~: NOT DETECTED
Cannabinoid 50 Ng, Ur ~~LOC~~: POSITIVE — AB
MDMA (Ecstasy)Ur Screen: NOT DETECTED
Methadone Scn, Ur: NOT DETECTED
OPIATE, UR SCREEN: NOT DETECTED
PHENCYCLIDINE (PCP) UR S: NOT DETECTED
Tricyclic, Ur Screen: NOT DETECTED

## 2016-10-31 LAB — ETHANOL: Alcohol, Ethyl (B): <5 mg/dL (ref <5)

## 2016-10-31 LAB — LIPASE, BLOOD: LIPASE: 29 U/L (ref 11–51)

## 2016-10-31 MED ORDER — NITROGLYCERIN 0.4 MG SL SUBL
0.4000 mg | SUBLINGUAL_TABLET | SUBLINGUAL | Status: DC | PRN
  Administered 2016-10-31 – 2016-11-01 (×5): 0.4 mg via SUBLINGUAL
  Filled 2016-10-31 (×3): qty 1

## 2016-10-31 MED ORDER — ASPIRIN 81 MG PO CHEW
324.0000 mg | CHEWABLE_TABLET | Freq: Once | ORAL | Status: AC
  Administered 2016-10-31: 324 mg via ORAL
  Filled 2016-10-31: qty 4

## 2016-10-31 MED ORDER — NITROGLYCERIN 2 % TD OINT
1.0000 [in_us] | TOPICAL_OINTMENT | Freq: Once | TRANSDERMAL | Status: AC
  Administered 2016-10-31: 1 [in_us] via TOPICAL
  Filled 2016-10-31: qty 1

## 2016-10-31 NOTE — ED Provider Notes (Signed)
[facility] Emergency Department Provider Note   ____________________________________________   First MD Initiated Contact with Patient 10/31/16 2317     (approximate)  I have reviewed the triage vital signs and the nursing notes.   HISTORY  Chief Complaint Chest Pain    HPI Fernando Salas is a 46 y.o. male brought to the ED via EMS from home with a chief complaint of chest pain. Patient has a history of CAD status post MI, hypertension, CKD with AV fistula placement not currently on HDwho reports onset of central chest pain approximately 4 hours ago after eating chicken and dumplings. He mainly complains of nonradiating central chest pain associated with nausea/vomiting 1 symptoms associated with diaphoresis. No shortness of breath. Also notes some right upper quadrant abdominal pain. Denies recent fever, chills, diarrhea. Denies recent travel or trauma. Admits he has not taken any of his medications in the past month. Denies ED medications within the past 48 hours.   Past Medical History:  Diagnosis Date  . CAD (coronary artery disease)    status post multiple myocardial infarctions  . Chronic kidney disease   . COPD (chronic obstructive pulmonary disease) ([place])   . Hyperlipidemia   . Hypertension   . Myocardial infarction ([place])    " I had 13 heart attacks, 8 massive"  . Proteinuria   . Renal insufficiency   . Secondary hyperparathyroidism of renal origin ([place])   . Shortness of breath dyspnea    " when I drink a cup of cold water too fast"  . Sleep apnea    pt stated " long story" does not wear CPAP  . Stroke [facility])     There are no active problems to display for this patient.   Past Surgical History:  Procedure Laterality Date  . AV FISTULA PLACEMENT Left 01-24-14   By Dr. Hortencia Pilar in [place]  . AV FISTULA PLACEMENT Right 04/30/2014   Procedure: RIGHT BRACHIOCEPHALIC ARTERIOVENOUS (AV) FISTULA CREATION;  Surgeon: Elam Dutch, MD;  Location: [facility] OR;  Service: Vascular;  Laterality: Right;  . AV FISTULA PLACEMENT Left 05/31/2014   Procedure: Left INSERTION OF ARTERIOVENOUS (AV) GORE-TEX GRAFT ARM;  Surgeon: Elam Dutch, MD;  Location: [place];  Service: Vascular;  Laterality: Left;  . CARDIAC CATHETERIZATION    . CORONARY STENT PLACEMENT     13 stents total    Prior to Admission medications   Medication Sig Start Date End Date Taking? Authorizing Provider  amLODipine (NORVASC) 10 MG tablet Take 10 mg by mouth daily.    [provider]  atorvastatin (LIPITOR) 20 MG tablet Take 20 mg by mouth daily.    [provider]  carvedilol (COREG) 25 MG tablet Take 25 mg by mouth 2 (two) times daily. 02/19/14   [provider]  chlorthalidone (HYGROTON) 25 MG tablet Take 25 mg by mouth daily.  03/02/14   [provider]  CVS NON-ASPIRIN EXTRA STRENGTH 500 MG tablet Take 1,000 mg by mouth every 6 (six) hours as needed. for pain 01/14/16   [provider]  enalapril (VASOTEC) 10 MG tablet Take 10 mg by mouth daily.    [provider]  fluticasone (FLONASE) 50 MCG/ACT nasal spray INSTILL 1 SPRAY PER NARES DAILY 01/14/16   [provider]  nitroGLYCERIN (NITROSTAT) 0.4 MG SL tablet Place 0.4 mg under the tongue every 5 (five) minutes as needed for chest pain.    [provider]    Allergies Ibuprofen;  Viagra [sildenafil]; and Clonidine derivatives  Family History  Problem Relation Age of Onset  . Heart disease Mother   . Hyperlipidemia Mother   . Hypertension Mother   . Kidney disease Father   . Kidney disease Brother   . Hyperlipidemia Sister   . Hyperlipidemia Daughter   . Hypertension Sister   . Hypertension Daughter     Social History Social History  Substance Use Topics  . Smoking status: Current Every Day Smoker    Packs/day: 0.50    Years: 25.00    Types: Cigarettes  . Smokeless tobacco: Never Used  . Alcohol use 0.0 oz/week      Comment: occasional    Review of Systems  Constitutional: No fever/chills. Eyes: No visual changes. ENT: No sore throat. Cardiovascular: positive for chest pain. Respiratory: Denies shortness of breath. Gastrointestinal: positive for abdominal pain, nausea and vomiting.  No diarrhea.  No constipation. Genitourinary: Negative for dysuria. Musculoskeletal: Negative for back pain. Skin: Negative for rash. Neurological: Negative for headaches, focal weakness or numbness.   ____________________________________________   PHYSICAL EXAM:  VITAL SIGNS: ED Triage Vitals [10/31/16 2314]  Enc Vitals Group     BP (!) 196/115     Pulse Rate 65     Resp 18     Temp 97.6 F (36.4 C)     Temp Source Oral     SpO2 98 %     Weight      Height      Head Circumference      Peak Flow      Pain Score 8     Pain Loc      Pain Edu?      Excl. in [place]?     Constitutional: Alert and oriented. Well appearing and in mild acute distress. Eyes: Conjunctivae are normal. PERRL. EOMI. Head: Atraumatic. Nose: No congestion/rhinnorhea. Mouth/Throat: Mucous membranes are moist.  Oropharynx non-erythematous. Neck: No stridor.   Cardiovascular: Normal rate, regular rhythm. Grossly normal heart sounds.  Good peripheral circulation. Respiratory: Normal respiratory effort.  No retractions. Lungs CTAB. Gastrointestinal: Soft and minimally tender to palpation right upper quadrant without rebound or guarding. No distention. No abdominal bruits. No CVA tenderness. Musculoskeletal: No lower extremity tenderness nor edema.  No joint effusions. Neurologic:  Normal speech and language. No gross focal neurologic deficits are appreciated.  Skin:  Skin is warm, diaphoretic and intact. No rash noted. Psychiatric: Mood and affect are normal. Speech and behavior are normal.  ____________________________________________   LABS (all labs ordered are listed, but only abnormal results are displayed)  Labs Reviewed    CBC WITH DIFFERENTIAL/PLATELET - Abnormal; Notable for the following:       Result Value   RBC 3.22 (*)    Hemoglobin 10.0 (*)    HCT 30.2 (*)    All other components within normal limits  COMPREHENSIVE METABOLIC PANEL - Abnormal; Notable for the following:    Chloride 112 (*)    CO2 16 (*)    Glucose, Bld 101 (*)    BUN 91 (*)    Creatinine, Ser 11.37 (*)    Calcium 7.7 (*)    GFR calc non Af Amer 5 (*)    GFR calc Af Amer 5 (*)    All other components within normal limits  TROPONIN I - Abnormal; Notable for the following:    Troponin I 0.08 (*)    All other components within normal limits  URINE DRUG SCREEN, QUALITATIVE ([facility] ONLY) - Abnormal; Notable  for the following:    Cannabinoid 50 Ng, Ur [place] POSITIVE (*)    All other components within normal limits  LIPASE, BLOOD  ETHANOL   ____________________________________________  EKG  ED ECG REPORT I, Robie Mcniel J, the attending physician, personally viewed and interpreted this ECG.   Date: 10/31/2016  EKG Time: 2314  Rate: 66  Rhythm: normal EKG, normal sinus rhythm  Axis: normal  Intervals:nonspecific intraventricular conduction delay  ST&T Change: T-wave inversion in V6 No significant change from 05/2014  ____________________________________________  RADIOLOGY  Dg Chest Port 1 View  Result Date: 11/01/2016 CLINICAL DATA:  Chest pain EXAM: PORTABLE CHEST 1 VIEW COMPARISON:  01/17/2014, 12/16/2006 FINDINGS: Cardiomegaly, increased compared to prior radiograph, with somewhat globular cardiac configuration. Central vascular congestion. Possible tiny pleural effusion. Linear atelectasis at the left base. No pneumothorax or focal infiltrate IMPRESSION: 1. Cardiomegaly, increased compared to prior, with central vascular congestion and suspected tiny effusions. Cardiac configuration somewhat globular, this could be due to multi chamber enlargement or possible pericardial effusion. Correlation with echocardiogram could be  obtained for further evaluation. Electronically Signed   By: Donavan Foil M.D.   On: 11/01/2016 00:00    ____________________________________________   PROCEDURES  Procedure(s) performed: None  Procedures  Critical Care performed: Yes, see critical care note(s)   CRITICAL CARE Performed by: Paulette Blanch   Total critical care time: 30 minutes  Critical care time was exclusive of separately billable procedures and treating other patients.  Critical care was necessary to treat or prevent imminent or life-threatening deterioration.  Critical care was time spent personally by me on the following activities: development of treatment plan with patient and/or surrogate as well as nursing, discussions with consultants, evaluation of patient's response to treatment, examination of patient, obtaining history from patient or surrogate, ordering and performing treatments and interventions, ordering and review of laboratory studies, ordering and review of radiographic studies, pulse oximetry and re-evaluation of patient's condition.  ____________________________________________   INITIAL IMPRESSION / ASSESSMENT AND PLAN / ED COURSE  Pertinent labs & imaging results that were available during my care of the patient were reviewed by me and considered in my medical decision making (see chart for details).  46 year old male with CAD status post MI, hypertension, CAD who presents with central chest pain.patient is diaphoretic and hypertensive on arrival. States 196/115 reflects his baseline blood pressures. Will administer aspirin and nitroglycerin. Check screening lab work including LFTs and lipase. Admits to recent marijuana use tonight; no cocaine use.  Clinical Course as of Nov 01 32  Nancy Fetter Nov 01, 2016  0029 Blood pressure transiently improved; now going back up. Will try hydralazine. Will place on heparin and nitroglycerin drips. Chest pain much improved, now4/10. Will discuss with  hospitalist to evaluate patient in the emergency department for admission.  [JS]    Clinical Course User Index [JS] Paulette Blanch, MD     ____________________________________________   FINAL CLINICAL IMPRESSION(S) / ED DIAGNOSES  Final diagnoses:  Chest pain, unspecified type  Unstable angina [facility])  Essential hypertension  Hypertensive urgency      NEW MEDICATIONS STARTED DURING THIS VISIT:  New Prescriptions   No medications on file     Note:  This document was prepared using Dragon voice recognition software and may include unintentional dictation errors.    Paulette Blanch, MD 11/01/16 [phone]

## 2016-10-31 NOTE — ED Notes (Signed)
Family at bedside. 

## 2016-10-31 NOTE — ED Triage Notes (Signed)
Patient arrived via EMS with 8/10 centralized CP with no radiation and SOB.  Patient has hx of MI/Stent and stated he has not taken his BP medication "in about a month".  Pt does not recall when his last MI/Stent was.

## 2016-10-31 NOTE — ED Notes (Signed)
XR at bedside

## 2016-11-01 ENCOUNTER — Encounter: Payer: Self-pay | Admitting: *Deleted

## 2016-11-01 DIAGNOSIS — Z886 Allergy status to analgesic agent status: Secondary | ICD-10-CM | POA: Diagnosis not present

## 2016-11-01 DIAGNOSIS — I200 Unstable angina: Secondary | ICD-10-CM | POA: Diagnosis present

## 2016-11-01 DIAGNOSIS — I252 Old myocardial infarction: Secondary | ICD-10-CM | POA: Diagnosis not present

## 2016-11-01 DIAGNOSIS — J449 Chronic obstructive pulmonary disease, unspecified: Secondary | ICD-10-CM | POA: Diagnosis present

## 2016-11-01 DIAGNOSIS — I69351 Hemiplegia and hemiparesis following cerebral infarction affecting right dominant side: Secondary | ICD-10-CM | POA: Diagnosis not present

## 2016-11-01 DIAGNOSIS — E872 Acidosis: Secondary | ICD-10-CM | POA: Diagnosis present

## 2016-11-01 DIAGNOSIS — Z5329 Procedure and treatment not carried out because of patient's decision for other reasons: Secondary | ICD-10-CM | POA: Diagnosis not present

## 2016-11-01 DIAGNOSIS — I160 Hypertensive urgency: Secondary | ICD-10-CM | POA: Diagnosis present

## 2016-11-01 DIAGNOSIS — I120 Hypertensive chronic kidney disease with stage 5 chronic kidney disease or end stage renal disease: Secondary | ICD-10-CM | POA: Diagnosis present

## 2016-11-01 DIAGNOSIS — E785 Hyperlipidemia, unspecified: Secondary | ICD-10-CM | POA: Diagnosis present

## 2016-11-01 DIAGNOSIS — N186 End stage renal disease: Secondary | ICD-10-CM | POA: Diagnosis present

## 2016-11-01 DIAGNOSIS — I214 Non-ST elevation (NSTEMI) myocardial infarction: Secondary | ICD-10-CM | POA: Diagnosis not present

## 2016-11-01 DIAGNOSIS — Z888 Allergy status to other drugs, medicaments and biological substances status: Secondary | ICD-10-CM | POA: Diagnosis not present

## 2016-11-01 DIAGNOSIS — D631 Anemia in chronic kidney disease: Secondary | ICD-10-CM | POA: Diagnosis present

## 2016-11-01 DIAGNOSIS — F17210 Nicotine dependence, cigarettes, uncomplicated: Secondary | ICD-10-CM | POA: Diagnosis present

## 2016-11-01 DIAGNOSIS — Z955 Presence of coronary angioplasty implant and graft: Secondary | ICD-10-CM | POA: Diagnosis not present

## 2016-11-01 DIAGNOSIS — N2581 Secondary hyperparathyroidism of renal origin: Secondary | ICD-10-CM | POA: Diagnosis present

## 2016-11-01 DIAGNOSIS — I25110 Atherosclerotic heart disease of native coronary artery with unstable angina pectoris: Secondary | ICD-10-CM | POA: Diagnosis present

## 2016-11-01 DIAGNOSIS — G4730 Sleep apnea, unspecified: Secondary | ICD-10-CM | POA: Diagnosis present

## 2016-11-01 DIAGNOSIS — R079 Chest pain, unspecified: Secondary | ICD-10-CM

## 2016-11-01 DIAGNOSIS — Z992 Dependence on renal dialysis: Secondary | ICD-10-CM | POA: Diagnosis not present

## 2016-11-01 DIAGNOSIS — Z79899 Other long term (current) drug therapy: Secondary | ICD-10-CM | POA: Diagnosis not present

## 2016-11-01 DIAGNOSIS — Z9114 Patient's other noncompliance with medication regimen: Secondary | ICD-10-CM | POA: Diagnosis not present

## 2016-11-01 LAB — COMPREHENSIVE METABOLIC PANEL
ALBUMIN: 3.6 g/dL (ref 3.5–5.0)
ALK PHOS: 53 U/L (ref 38–126)
ALT: 27 U/L (ref 17–63)
AST: 26 U/L (ref 15–41)
Anion gap: 11 (ref 5–15)
BILIRUBIN TOTAL: 0.6 mg/dL (ref 0.3–1.2)
BUN: 91 mg/dL — AB (ref 6–20)
CALCIUM: 7.7 mg/dL — AB (ref 8.9–10.3)
CO2: 16 mmol/L — AB (ref 22–32)
CREATININE: 11.37 mg/dL — AB (ref 0.61–1.24)
Chloride: 112 mmol/L — ABNORMAL HIGH (ref 101–111)
GFR calc Af Amer: 5 mL/min — ABNORMAL LOW (ref >60)
GFR calc non Af Amer: 5 mL/min — ABNORMAL LOW (ref >60)
GLUCOSE: 101 mg/dL — AB (ref 65–99)
Potassium: 4.5 mmol/L (ref 3.5–5.1)
SODIUM: 139 mmol/L (ref 135–145)
Total Protein: 7 g/dL (ref 6.5–8.1)

## 2016-11-01 LAB — LIPID PANEL
CHOLESTEROL: 175 mg/dL (ref 0–200)
HDL: 29 mg/dL — AB (ref >40)
LDL Cholesterol: 130 mg/dL — ABNORMAL HIGH (ref 0–99)
Total CHOL/HDL Ratio: 6 RATIO
Triglycerides: 79 mg/dL (ref <150)
VLDL: 16 mg/dL (ref 0–40)

## 2016-11-01 LAB — PROTIME-INR
INR: 1.2
INR: 1.35
Prothrombin Time: 15.1 seconds (ref 11.4–15.2)
Prothrombin Time: 16.6 seconds — ABNORMAL HIGH (ref 11.4–15.2)

## 2016-11-01 LAB — BASIC METABOLIC PANEL
ANION GAP: 8 (ref 5–15)
BUN: 89 mg/dL — ABNORMAL HIGH (ref 6–20)
CALCIUM: 7.7 mg/dL — AB (ref 8.9–10.3)
CO2: 16 mmol/L — ABNORMAL LOW (ref 22–32)
CREATININE: 11.16 mg/dL — AB (ref 0.61–1.24)
Chloride: 114 mmol/L — ABNORMAL HIGH (ref 101–111)
GFR, EST AFRICAN AMERICAN: 6 mL/min — AB (ref >60)
GFR, EST NON AFRICAN AMERICAN: 5 mL/min — AB (ref >60)
Glucose, Bld: 115 mg/dL — ABNORMAL HIGH (ref 65–99)
Potassium: 4.7 mmol/L (ref 3.5–5.1)
SODIUM: 138 mmol/L (ref 135–145)

## 2016-11-01 LAB — CBC
HEMATOCRIT: 24.9 % — AB (ref 40.0–52.0)
HEMOGLOBIN: 8.4 g/dL — AB (ref 13.0–18.0)
MCH: 31.3 pg (ref 26.0–34.0)
MCHC: 33.8 g/dL (ref 32.0–36.0)
MCV: 92.7 fL (ref 80.0–100.0)
Platelets: 150 10*3/uL (ref 150–440)
RBC: 2.69 MIL/uL — ABNORMAL LOW (ref 4.40–5.90)
RDW: 14.5 % (ref 11.5–14.5)
WBC: 5.2 10*3/uL (ref 3.8–10.6)

## 2016-11-01 LAB — BRAIN NATRIURETIC PEPTIDE: B NATRIURETIC PEPTIDE 5: 2699 pg/mL — AB (ref 0.0–100.0)

## 2016-11-01 LAB — TROPONIN I
TROPONIN I: 3.81 ng/mL — AB (ref <0.03)
Troponin I: 0.08 ng/mL (ref <0.03)
Troponin I: 0.27 ng/mL (ref <0.03)
Troponin I: 1.73 ng/mL (ref <0.03)

## 2016-11-01 LAB — HEPARIN LEVEL (UNFRACTIONATED)

## 2016-11-01 LAB — APTT: aPTT: 36 seconds (ref 24–36)

## 2016-11-01 LAB — TSH: TSH: 1.147 u[IU]/mL (ref 0.350–4.500)

## 2016-11-01 LAB — T4, FREE: FREE T4: 0.79 ng/dL (ref 0.61–1.12)

## 2016-11-01 MED ORDER — HEPARIN SODIUM (PORCINE) 5000 UNIT/ML IJ SOLN: 4000.0000 [IU] | Freq: Once | INTRAMUSCULAR | Status: DC

## 2016-11-01 MED ORDER — ATORVASTATIN CALCIUM 20 MG PO TABS: 20.0000 mg | ORAL_TABLET | Freq: Every day | ORAL | Status: DC

## 2016-11-01 MED ORDER — ONDANSETRON HCL 4 MG/2ML IJ SOLN
INTRAMUSCULAR | Status: AC
  Filled 2016-11-01: qty 2

## 2016-11-01 MED ORDER — HEPARIN BOLUS VIA INFUSION
4000.0000 [IU] | Freq: Once | INTRAVENOUS | Status: AC
  Administered 2016-11-01: 4000 [IU] via INTRAVENOUS
  Filled 2016-11-01: qty 4000

## 2016-11-01 MED ORDER — SODIUM CHLORIDE 0.9 % IV SOLN: 250.0000 mL | INTRAVENOUS | Status: DC | PRN

## 2016-11-01 MED ORDER — ACETAMINOPHEN 325 MG PO TABS
650.0000 mg | ORAL_TABLET | Freq: Four times a day (QID) | ORAL | Status: DC | PRN
  Administered 2016-11-01 (×2): 650 mg via ORAL
  Filled 2016-11-01 (×2): qty 2

## 2016-11-01 MED ORDER — NITROGLYCERIN IN D5W 200-5 MCG/ML-% IV SOLN
5.0000 ug/min | Freq: Once | INTRAVENOUS | Status: AC
  Administered 2016-11-01: 5 ug/min via INTRAVENOUS
  Filled 2016-11-01: qty 250

## 2016-11-01 MED ORDER — CARVEDILOL 25 MG PO TABS
25.0000 mg | ORAL_TABLET | Freq: Two times a day (BID) | ORAL | Status: DC
  Administered 2016-11-01 – 2016-11-02 (×3): 25 mg via ORAL
  Filled 2016-11-01 (×3): qty 1

## 2016-11-01 MED ORDER — HEPARIN (PORCINE) IN NACL 100-0.45 UNIT/ML-% IJ SOLN: 14.0000 [IU]/kg/h | Freq: Once | INTRAMUSCULAR | Status: DC

## 2016-11-01 MED ORDER — HYDRALAZINE HCL 20 MG/ML IJ SOLN: 5.0000 mg | Freq: Four times a day (QID) | INTRAMUSCULAR | Status: DC | PRN

## 2016-11-01 MED ORDER — HEPARIN (PORCINE) IN NACL 100-0.45 UNIT/ML-% IJ SOLN
1250.0000 [IU]/h | INTRAMUSCULAR | Status: DC
  Administered 2016-11-01: 1350 [IU]/h via INTRAVENOUS
  Filled 2016-11-01: qty 250

## 2016-11-01 MED ORDER — FLUTICASONE PROPIONATE 50 MCG/ACT NA SUSP
1.0000 | Freq: Every day | NASAL | Status: DC
  Administered 2016-11-01: 1 via NASAL
  Filled 2016-11-01: qty 16

## 2016-11-01 MED ORDER — SODIUM CHLORIDE 0.9% FLUSH: 3.0000 mL | INTRAVENOUS | Status: DC | PRN

## 2016-11-01 MED ORDER — NITROGLYCERIN IN D5W 200-5 MCG/ML-% IV SOLN: 0.0000 ug/min | INTRAVENOUS | Status: DC

## 2016-11-01 MED ORDER — ONDANSETRON HCL 4 MG/2ML IJ SOLN
4.0000 mg | Freq: Four times a day (QID) | INTRAMUSCULAR | Status: DC | PRN
  Administered 2016-11-01 (×2): 4 mg via INTRAVENOUS
  Filled 2016-11-01: qty 2

## 2016-11-01 MED ORDER — NITROGLYCERIN 2 % TD OINT
0.5000 [in_us] | TOPICAL_OINTMENT | Freq: Four times a day (QID) | TRANSDERMAL | Status: DC
  Administered 2016-11-02 (×3): 0.5 [in_us] via TOPICAL
  Filled 2016-11-01 (×3): qty 1

## 2016-11-01 MED ORDER — ISOSORB DINITRATE-HYDRALAZINE 20-37.5 MG PO TABS
1.0000 | ORAL_TABLET | Freq: Three times a day (TID) | ORAL | Status: DC
  Administered 2016-11-01 – 2016-11-02 (×4): 1 via ORAL
  Filled 2016-11-01 (×4): qty 1

## 2016-11-01 MED ORDER — FUROSEMIDE 40 MG PO TABS
80.0000 mg | ORAL_TABLET | Freq: Every day | ORAL | Status: DC
  Administered 2016-11-01 – 2016-11-02 (×2): 80 mg via ORAL
  Filled 2016-11-01 (×2): qty 2

## 2016-11-01 MED ORDER — NITROGLYCERIN IN D5W 200-5 MCG/ML-% IV SOLN
5.0000 ug/min | INTRAVENOUS | Status: DC
  Administered 2016-11-01 (×2): 5 ug/min via INTRAVENOUS

## 2016-11-01 MED ORDER — ATORVASTATIN CALCIUM 20 MG PO TABS
40.0000 mg | ORAL_TABLET | Freq: Every day | ORAL | Status: DC
  Administered 2016-11-01 – 2016-11-02 (×2): 40 mg via ORAL
  Filled 2016-11-01 (×2): qty 2

## 2016-11-01 MED ORDER — AMLODIPINE BESYLATE 10 MG PO TABS
10.0000 mg | ORAL_TABLET | Freq: Every day | ORAL | Status: DC
  Administered 2016-11-01 – 2016-11-02 (×2): 10 mg via ORAL
  Filled 2016-11-01 (×2): qty 1

## 2016-11-01 MED ORDER — ASPIRIN EC 81 MG PO TBEC: 81.0000 mg | DELAYED_RELEASE_TABLET | Freq: Every day | ORAL | Status: DC

## 2016-11-01 MED ORDER — SODIUM CHLORIDE 0.9% FLUSH
3.0000 mL | Freq: Two times a day (BID) | INTRAVENOUS | Status: DC
  Administered 2016-11-01 – 2016-11-02 (×4): 3 mL via INTRAVENOUS

## 2016-11-01 MED ORDER — MORPHINE SULFATE (PF) 4 MG/ML IV SOLN
4.0000 mg | Freq: Once | INTRAVENOUS | Status: AC
  Administered 2016-11-01: 4 mg via INTRAVENOUS
  Filled 2016-11-01: qty 1

## 2016-11-01 MED ORDER — ALPRAZOLAM 0.25 MG PO TABS: 0.2500 mg | ORAL_TABLET | Freq: Two times a day (BID) | ORAL | Status: DC | PRN

## 2016-11-01 MED ORDER — ENALAPRIL MALEATE 10 MG PO TABS
10.0000 mg | ORAL_TABLET | Freq: Every day | ORAL | Status: DC
  Administered 2016-11-01: 10 mg via ORAL
  Filled 2016-11-01: qty 1

## 2016-11-01 MED ORDER — HYDRALAZINE HCL 20 MG/ML IJ SOLN: 10.0000 mg | Freq: Four times a day (QID) | INTRAMUSCULAR | Status: DC | PRN

## 2016-11-01 MED ORDER — CHLORTHALIDONE 25 MG PO TABS
25.0000 mg | ORAL_TABLET | Freq: Every day | ORAL | Status: DC
Start: 2016-11-01 — End: 2016-11-01
  Administered 2016-11-01: 25 mg via ORAL
  Filled 2016-11-01: qty 1

## 2016-11-01 NOTE — H&P (Addendum)
History and Physical   [facility] Admission History and Physical [employer], D.O.    Patient Name: Fernando Salas MR#: [id number] Date of Birth: June 05, 1970 Date of Admission: 10/31/2016  Referring MD/NP/PA: Dr. Beather Arbour Primary Care Physician: [name], MD  Chief Complaint:  Chief Complaint  Patient presents with  . Chest Pain    HPI: Fernando Salas is a 46 y.o. 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  Patient was in a usual state of health until 4 hours prior to arrival really describe the onset of midsternal chest and right upper quadrant abdominal pain which are both localized, nonradiating and associated with nausea, vomiting, diaphoresis. Symptoms began after he ate a meal of chicken dumplings.  Patient denies fevers/chills, weakness, dizziness, shortness of breath, N/V/C/D, abdominal pain, dysuria/frequency, changes in mental status.    Otherwise there has been no change in status. Patient has notbeen taking medicationfor the past month. There has been no recent change in medication or diet.  No recent antibiotics.  There has been no recent illness, hospitalizations, travel or sick contacts.    EMS/ED Course: Patient received spirin 324, heparin bolus and infusion, nitroglycerin sublingual, topicaland intravenous as well as morphine. Medical admission has been requested for further management of chest pain and hypertensive urgency  Review of Systems:  CONSTITUTIONAL: No fever/chills, fatigue, weakness, weight gain/loss, headache. EYES: No blurry or double vision. ENT: No tinnitus, postnasal drip, redness or soreness of the oropharynx. RESPIRATORY: No cough, dyspnea, wheeze.  No hemoptysis.  CARDIOVASCULAR: ositive chest pain, palpitations, syncope, orthopnea. No  lower extremity edema.  GASTROINTESTINAL: positivenausea, vomiting,positive right upper quadrant abdominal pain, negativediarrhea, constipation.  No hematemesis, melena or hematochezia. GENITOURINARY: No dysuria, frequency, hematuria. ENDOCRINE: No polyuria or nocturia. No heat or cold intolerance. HEMATOLOGY: No anemia, bruising, bleeding. INTEGUMENTARY: No rashes, ulcers, lesions. MUSCULOSKELETAL: No arthritis, gout, dyspnea. NEUROLOGIC: No numbness, tingling, ataxia, seizure-type activity, weakness. PSYCHIATRIC: No anxiety, depression, insomnia.   Past Medical History:  Diagnosis Date  . CAD (coronary artery disease)    status post multiple myocardial infarctions  . Chronic kidney disease   . COPD (chronic obstructive pulmonary disease) ([place])   . Hyperlipidemia   . Hypertension   . Myocardial infarction ([place])    " I had 13 heart attacks, 8 massive"  . Proteinuria   . Renal insufficiency   . Secondary hyperparathyroidism of renal origin ([place])   . Shortness of breath dyspnea    " when I drink a cup of cold water too fast"  . Sleep apnea    pt stated " long story" does not wear CPAP  . Stroke [facility])     Past Surgical History:  Procedure Laterality Date  . AV FISTULA PLACEMENT Left 01-24-14   By Dr. Hortencia Pilar in [place]  . AV FISTULA PLACEMENT Right 04/30/2014   Procedure: RIGHT BRACHIOCEPHALIC ARTERIOVENOUS (AV) FISTULA CREATION;  Surgeon: Elam Dutch, MD;  Location: [facility] OR;  Service: Vascular;  Laterality: Right;  . AV FISTULA PLACEMENT Left 05/31/2014   Procedure: Left INSERTION OF ARTERIOVENOUS (AV) GORE-TEX GRAFT ARM;  Surgeon: Elam Dutch, MD;  Location: [place];  Service: Vascular;  Laterality: Left;  . CARDIAC CATHETERIZATION    . CORONARY STENT PLACEMENT     13 stents total     reports that he has been  smoking Cigarettes.  He has a 12.50 pack-year smoking history. He has never used smokeless tobacco. He reports that he drinks alcohol. He reports that  he uses drugs, including Marijuana. denies cocaine use  Allergies  Allergen Reactions  . Ibuprofen Other (See Comments)    Kidney   . Viagra [Sildenafil]     Lips swell   . Clonidine Derivatives Rash    Family History  Problem Relation Age of Onset  . Heart disease Mother   . Hyperlipidemia Mother   . Hypertension Mother   . Kidney disease Father   . Kidney disease Brother   . Hyperlipidemia Sister   . Hyperlipidemia Daughter   . Hypertension Sister   . Hypertension Daughter     Prior to Admission medications   Medication Sig Start Date End Date Taking? Authorizing Provider  amLODipine (NORVASC) 10 MG tablet Take 10 mg by mouth daily.    [provider]  atorvastatin (LIPITOR) 20 MG tablet Take 20 mg by mouth daily.    [provider]  carvedilol (COREG) 25 MG tablet Take 25 mg by mouth 2 (two) times daily. 02/19/14   [provider]  chlorthalidone (HYGROTON) 25 MG tablet Take 25 mg by mouth daily.  03/02/14   [provider]  [employer] NON-ASPIRIN EXTRA STRENGTH 500 MG tablet Take 1,000 mg by mouth every 6 (six) hours as needed. for pain 01/14/16   [provider]  enalapril (VASOTEC) 10 MG tablet Take 10 mg by mouth daily.    [provider]  fluticasone (FLONASE) 50 MCG/ACT nasal spray INSTILL 1 SPRAY PER NARES DAILY 01/14/16   [provider]  nitroGLYCERIN (NITROSTAT) 0.4 MG SL tablet Place 0.4 mg under the tongue every 5 (five) minutes as needed for chest pain.    [provider]    Physical Exam: Vitals:   11/01/16 0002 11/01/16 0058 11/01/16 0107 11/01/16 0120  BP: (!) 190/107  (!) 207/112 (!) 184/116  Pulse: 75  62 67  Resp: 18  16 (!) 7  Temp:      TempSrc:      SpO2: 96%  99% 98%  Weight:  104.3 kg (230 lb)    Height:  5\' 10"  (1.778 m)      GENERAL: 46 y.o.-year-old male patient, well-developed, well-nourished lying in the bed in no acute distress.  Pleasant and cooperative.   HEENT: Head  atraumatic, normocephalic. Pupils equal. Mucus membranes moist. NECK: Supple, full range of motion. No JVD, no bruit heard. No thyroid enlargement, no tenderness, no cervical lymphadenopathy. CHEST: Normal breath sounds bilaterally. No wheezing, rales, rhonchi or crackles. No use of accessory muscles of respiration.  No reproducible chest wall tenderness.  CARDIOVASCULAR: S1, S2 normal. No murmurs, rubs, or gallops. Cap refill <2 seconds. Pulses intact distally.  ABDOMEN: Soft, nondistended, mild tenderness palpation of the right upper quadrant.No rebound, guarding, rigidity. Normoactive bowel sounds present in all four quadrants.  EXTREMITIES: No pedal edema, cyanosis, or clubbing. No calf tenderness or Homan's sign.  NEUROLOGIC: The patient is alert and oriented x 3. Cranial nerves II through XII are grossly intact with no focal sensorimotor deficit. PSYCHIATRIC:  Normal affect, mood, thought content. SKIN: Warm, dry, and intact without obvious rash, lesion, or ulcer.    Labs on Admission:  CBC:  Recent Labs Lab 10/31/16 2319  WBC 5.7  NEUTROABS 3.8  HGB 10.0*  HCT 30.2*  MCV 93.9  PLT 073   Basic Metabolic Panel:  Recent Labs Lab 10/31/16 2319  NA 139  K 4.5  CL 112*  CO2 16*  GLUCOSE 101*  BUN 91*  CREATININE 11.37*  CALCIUM 7.7*   GFR: Estimated Creatinine Clearance: 9.8 mL/min (A) (by C-G formula based on SCr of 11.37 mg/dL (H)). Liver Function Tests:  Recent Labs Lab 10/31/16 2319  AST 26  ALT 27  ALKPHOS 53  BILITOT 0.6  PROT 7.0  ALBUMIN 3.6    Recent Labs Lab 10/31/16 2319  LIPASE 29   No results for input(s): AMMONIA in the last 168 hours. Coagulation Profile:  Recent Labs Lab 10/31/16 2319  INR 1.20   Cardiac Enzymes:  Recent Labs Lab 10/31/16 2319  TROPONINI 0.08*   BNP (last 3 results) No results for input(s): PROBNP in the last 8760 hours. HbA1C: No results for input(s): HGBA1C in the last 72 hours. CBG: No results for  input(s): GLUCAP in the last 168 hours. Lipid Profile: No results for input(s): CHOL, HDL, LDLCALC, TRIG, CHOLHDL, LDLDIRECT in the last 72 hours. Thyroid Function Tests: No results for input(s): TSH, T4TOTAL, FREET4, T3FREE, THYROIDAB in the last 72 hours. Anemia Panel: No results for input(s): VITAMINB12, FOLATE, FERRITIN, TIBC, IRON, RETICCTPCT in the last 72 hours. Urine analysis:    Component Value Date/Time   COLORURINE Yellow 01/17/2014 1139   APPEARANCEUR Clear 01/17/2014 1139   LABSPEC 1.015 01/17/2014 1139   PHURINE 5.0 01/17/2014 1139   GLUCOSEU Negative 01/17/2014 1139   HGBUR Negative 01/17/2014 1139   BILIRUBINUR Negative 01/17/2014 1139   KETONESUR Negative 01/17/2014 1139   PROTEINUR 100 mg/dL 01/17/2014 1139   NITRITE Negative 01/17/2014 1139   LEUKOCYTESUR Negative 01/17/2014 1139   Sepsis Labs: @LABRCNTIP (procalcitonin:4,lacticidven:4) )No results found for this or any previous visit (from the past 240 hour(s)).   Radiological Exams on Admission: Dg Chest Port 1 View  Result Date: 11/01/2016 CLINICAL DATA:  Chest pain EXAM: PORTABLE CHEST 1 VIEW COMPARISON:  01/17/2014, 12/16/2006 FINDINGS: Cardiomegaly, increased compared to prior radiograph, with somewhat globular cardiac configuration. Central vascular congestion. Possible tiny pleural effusion. Linear atelectasis at the left base. No pneumothorax or focal infiltrate IMPRESSION: 1. Cardiomegaly, increased compared to prior, with central vascular congestion and suspected tiny effusions. Cardiac configuration somewhat globular, this could be due to multi chamber enlargement or possible pericardial effusion. Correlation with echocardiogram could be obtained for further evaluation. Electronically Signed   By: Donavan Foil M.D.   On: 11/01/2016 00:00    EKG: Normal sinus rhythm at 66 bpm with normal axis, IVCD and nonspecific ST-T wave changes.   Assessment/Plan  This is a 46 y.o. 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 Unstable angina in a patient with a history of coronary artery disease - Admit to inpatient with telemetry monitoring. - Trend troponins, check lipids and TSH. - Morphine, nitro, Coreg, aspirin and Lipitor ordered.   - Continue heparin drip initiated in ED - Check echo - NPO - Cardiology consultation has been requested.  #. Hypertensive urgency - Nitroglycerin drip initiated in ED - Hydralazine PRN - resume Coreg, chlorthalidone, Norvasc, enalapril  #. ESRD  - Nephrology consult for HD  #. Anemia, likely chronic - Continue monitor CBC  #. History of hyperlipidemia - Continue lipitor  Admission status: Inpatient IV Fluids: HL Diet/Nutrition: NPO Consults called: Cardio, nephrology  DVT Px: Heparin SCDs and early ambulation. Code Status: Full Code  Disposition Plan: To home in  1-2 days  All the records are reviewed and case discussed with ED provider. Management plans discussed with the patient and/or family who express understanding and agree with plan of care.  Brylan Dec D.O. on 11/01/2016 at 1:45 AM Between 7am to 6pm - Pager - [phone] After 6pm go to [email or web address] - [employer] [facility] [place] Office [phone] CC: Primary care physician; [name], MD   11/01/2016, 1:45 AM

## 2016-11-01 NOTE — Progress Notes (Signed)
ANTICOAGULATION CONSULT NOTE - Initial Consult  Pharmacy Consult for heparin drip Indication: chest pain/ACS  Allergies  Allergen Reactions  . Ibuprofen Other (See Comments)    Kidney   . Viagra [Sildenafil]     Lips swell   . Clonidine Derivatives Rash    Patient Measurements: Height: 5\' 10"  (177.8 cm) Weight: 230 lb (104.3 kg) IBW/kg (Calculated) : 73 Heparin Dosing Weight: 95 kg  Vital Signs: Temp: 97.6 F (36.4 C) (09/01 2314) Temp Source: Oral (09/01 2314) BP: 190/107 (09/02 0002) Pulse Rate: 75 (09/02 0002)  Labs:  Recent Labs  10/31/16 2319  HGB 10.0*  HCT 30.2*  PLT 173  CREATININE 11.37*  TROPONINI 0.08*    Estimated Creatinine Clearance: 9.8 mL/min (A) (by C-G formula based on SCr of 11.37 mg/dL (H)).   Medical History: Past Medical History:  Diagnosis Date  . CAD (coronary artery disease)    status post multiple myocardial infarctions  . Chronic kidney disease   . COPD (chronic obstructive pulmonary disease) ([place])   . Hyperlipidemia   . Hypertension   . Myocardial infarction ([place])    " I had 13 heart attacks, 8 massive"  . Proteinuria   . Renal insufficiency   . Secondary hyperparathyroidism of renal origin ([place])   . Shortness of breath dyspnea    " when I drink a cup of cold water too fast"  . Sleep apnea    pt stated " long story" does not wear CPAP  . Stroke [facility])     Medications:  No anticoagulation in available PTA med list  Assessment:  Goal of Therapy:  Heparin level 0.3-0.7 units/ml Monitor platelets by anticoagulation protocol: Yes   Plan:  4000 unit bolus and initial rate of 1350 units/hr. First heparin level 8 hours after start of infusion  Jaleel Allen S 11/01/2016,1:07 AM

## 2016-11-01 NOTE — Progress Notes (Signed)
[facility] [place] NAME: Fernando Salas    MR#:  [id number]  DATE OF BIRTH:  01-01-1971  SUBJECTIVE:  CHIEF COMPLAINT:   Chief Complaint  Patient presents with  . Chest Pain   Still chest pain on and off, nausea but no vomiting or diarrhea. No melena or bloody stool. REVIEW OF SYSTEMS:  Review of Systems  Constitutional: Negative for chills, fever and malaise/fatigue.  HENT: Negative for sore throat.   Eyes: Negative for blurred vision and double vision.  Respiratory: Negative for cough, hemoptysis, shortness of breath, wheezing and stridor.   Cardiovascular: Positive for chest pain. Negative for palpitations, orthopnea and leg swelling.  Gastrointestinal: Negative for abdominal pain, blood in stool, diarrhea, melena, nausea and vomiting.  Genitourinary: Negative for dysuria, flank pain and hematuria.  Musculoskeletal: Negative for back pain and joint pain.  Skin: Negative for rash.  Neurological: Negative for dizziness, sensory change, focal weakness, seizures, loss of consciousness, weakness and headaches.  Endo/Heme/Allergies: Negative for polydipsia.  Psychiatric/Behavioral: Negative for depression. The patient is not nervous/anxious.     DRUG ALLERGIES:   Allergies  Allergen Reactions  . Ibuprofen Other (See Comments)    Kidney   . Viagra [Sildenafil]     Lips swell   . Clonidine Derivatives Rash   VITALS:  Blood pressure (!) 164/92, pulse 66, temperature 98.1 F (36.7 C), temperature source Oral, resp. rate 13, height 5\' 10"  (1.778 m), weight 197 lb 1.6 oz (89.4 kg), SpO2 93 %. PHYSICAL EXAMINATION:  Physical Exam  Constitutional: He is oriented to person, place, and time and well-developed, well-nourished, and in no distress.  HENT:  Head: Normocephalic.  Mouth/Throat: Oropharynx is clear and moist.  Eyes: Pupils are equal, round, and reactive to light. Conjunctivae and EOM are normal. No scleral icterus.  Neck:  Normal range of motion. Neck supple. No JVD present. No tracheal deviation present.  Cardiovascular: Normal rate, regular rhythm and normal heart sounds.  Exam reveals no gallop.   No murmur heard. Pulmonary/Chest: Effort normal and breath sounds normal. No respiratory distress. He has no wheezes. He has no rales.  Abdominal: Soft. Bowel sounds are normal. He exhibits no distension. There is no tenderness. There is no rebound.  Musculoskeletal: Normal range of motion. He exhibits no edema or tenderness.  Neurological: He is alert and oriented to person, place, and time. No cranial nerve deficit.  Skin: No rash noted. No erythema.  Psychiatric: Affect normal.   LABORATORY PANEL:  Male CBC  Recent Labs Lab 11/01/16 0448  WBC 5.2  HGB 8.4*  HCT 24.9*  PLT 150   ------------------------------------------------------------------------------------------------------------------ Chemistries   Recent Labs Lab 10/31/16 2319 11/01/16 0448  NA 139 138  K 4.5 4.7  CL 112* 114*  CO2 16* 16*  GLUCOSE 101* 115*  BUN 91* 89*  CREATININE 11.37* 11.16*  CALCIUM 7.7* 7.7*  AST 26  --   ALT 27  --   ALKPHOS 53  --   BILITOT 0.6  --    RADIOLOGY:  Dg Chest Port 1 View  Result Date: 11/01/2016 CLINICAL DATA:  Chest pain EXAM: PORTABLE CHEST 1 VIEW COMPARISON:  01/17/2014, 12/16/2006 FINDINGS: Cardiomegaly, increased compared to prior radiograph, with somewhat globular cardiac configuration. Central vascular congestion. Possible tiny pleural effusion. Linear atelectasis at the left base. No pneumothorax or focal infiltrate IMPRESSION: 1. Cardiomegaly, increased compared to prior, with central vascular congestion and suspected tiny effusions. Cardiac configuration somewhat globular, this could be  due to multi chamber enlargement or possible pericardial effusion. Correlation with echocardiogram could be obtained for further evaluation. Electronically Signed   By: Donavan Foil M.D.   On: 11/01/2016  00:00   ASSESSMENT AND PLAN:   This is a 46 y.o. 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 Unstable angina in a patient with a history of coronary artery disease, s/p multiple stent placement.  Troponins are trending up, continue ASA, Lipitor, Morphine, nitro, Coreg. Discontinue heparin drip and Not a candidate for cath per Dr. Johnsie Cancel.  #. Hypertensive urgency On Nitroglycerin drip and Hydralazine PRN Start lasix and Bidil, resumed Coreg,  Norvasc.  discontinued chlorthalidone and enalapril.   #. ESRD  The patient declined hemodialysis per Dr. Holley Raring.  #. Anemia of chronic disease. Hb down to 8.4. No active bleeding. Follow-up hemoglobin.  #. History of hyperlipidemia - Continue lipitor  COPD. Stable, NEB when necessary. Tobacco abuse. Smoking cessation was counseled for 3-4 minutes.  All the records are reviewed and case discussed with Care Management/Social Worker. Management plans discussed with the patient, family and they are in agreement.  CODE STATUS: Full Code  TOTAL TIME TAKING CARE OF THIS PATIENT: 37 minutes.   More than 50% of the time was spent in counseling/coordination of care: YES  POSSIBLE D/C IN 3 DAYS, DEPENDING ON CLINICAL CONDITION.   Demetrios Loll M.D on 11/01/2016 at 2:06 PM  Between 7am to 6pm - Pager - [phone]  After 6pm go to [email or web address] - [facility]

## 2016-11-01 NOTE — Progress Notes (Signed)
Patient admitted to 2A RM241. Admission navigators completed and safety contract signed. Pt and family oriented to room and unit with call bell in reach. Nitro drip was titrated to the max of 48mcg per standing order with a BP of 156/81. MD paged for clarification about increasing the titration parameters to 45mcg or to leave the pt at 61mcg. RN ordered to leave pt at 73mcg.Nursing will continue to assess.

## 2016-11-01 NOTE — ED Notes (Signed)
Date and time results received: 11/01/16 0009  Test: Troponin Critical Value: 0.08  Name of Provider Notified: Beather Arbour  Orders Received? Or Actions Taken?: None at this time.

## 2016-11-01 NOTE — Progress Notes (Signed)
Hct down to 24.9 from 30.2 on admission Doubt active SEMI with troponin just being up from CRF and Cr over 11 Will stop heparin   Fernando Salas

## 2016-11-01 NOTE — ED Notes (Signed)
Patient taken to 2A-241 by this RN and Nicki Reaper, EDT

## 2016-11-01 NOTE — Progress Notes (Signed)
[place] for heparin drip Indication: chest pain/ACS  Allergies  Allergen Reactions  . Ibuprofen Other (See Comments)    Kidney   . Viagra [Sildenafil]     Lips swell   . Clonidine Derivatives Rash    Patient Measurements: Height: 5\' 10"  (177.8 cm) Weight: 197 lb 1.6 oz (89.4 kg) IBW/kg (Calculated) : 73 Heparin Dosing Weight: 89.4 kg  Vital Signs: Temp: 98.1 F (36.7 C) (09/02 0308) Temp Source: Oral (09/02 0308) BP: 191/111 (09/02 0725) Pulse Rate: 65 (09/02 0725)  Labs:  Recent Labs  10/31/16 2319 11/01/16 0448 11/01/16 0909  HGB 10.0* 8.4*  --   HCT 30.2* 24.9*  --   PLT 173 150  --   APTT 36  --   --   LABPROT 15.1 16.6*  --   INR 1.20 1.35  --   HEPARINUNFRC  --   --  <0.10*  CREATININE 11.37* 11.16*  --   TROPONINI 0.08* 0.27*  --     Estimated Creatinine Clearance: 9.3 mL/min (A) (by C-G formula based on SCr of 11.16 mg/dL (H)).   Medical History: Past Medical History:  Diagnosis Date  . CAD (coronary artery disease)    status post multiple myocardial infarctions  . Chronic kidney disease   . COPD (chronic obstructive pulmonary disease) ([place])   . Hyperlipidemia   . Hypertension   . Myocardial infarction ([place])    " I had 13 heart attacks, 8 massive"  . Proteinuria   . Renal insufficiency   . Secondary hyperparathyroidism of renal origin ([place])   . Shortness of breath dyspnea    " when I drink a cup of cold water too fast"  . Sleep apnea    pt stated " long story" does not wear CPAP  . Stroke [facility])     Medications:  No anticoagulation in available PTA med list  Assessment: 46 yo male here with chest pain with hx of CAD s/p MI  Goal of Therapy:  Heparin level 0.3-0.7 units/ml Monitor platelets by anticoagulation protocol: Yes   Plan:  9/2: 4000 unit bolus and initial rate of 1350 units/hr. First heparin level 8 hours after start of infusion  9/2 0909: HL <0.10 Spoke with RN who stated heparin  drip is running at 8.4 ml/hr which does not match order for 13.5 ml/hr. Weight also decreased. Heparin drip rate adjusted to 1250 units/hr. No s/sx of bleeding noted per RN. Discussed Hgb drop with cardiologist.  Rayna Sexton L 11/01/2016,10:58 AM

## 2016-11-01 NOTE — Consult Note (Signed)
Cardiology Consultation:   Patient ID: Fernando Salas; [id number]; 1970/12/14   Admit date: 10/31/2016 Date of Consult: 11/01/2016  Primary Care Provider: [name], MD Primary Cardiologist: Fernando Salas Electrophysiologist:  None   Patient Profile:   Fernando Salas is a 46 y.o. male with a hx of CRF who is being seen today for the evaluation of chest pain at the request of Fernando Salas.  History of Present Illness:   Fernando Salas 46 y.o. with unusual history Has been in jail Indicates 13 heart attacks 8 of which were massive. Says he has been seen at [facility] and by Fernando Fernando Salas having multiple stents and even a cath here at [facility]. I am unable to find any of these records or substantiate  Had midsternal chest pain associated with RUQ pain. Some nausea and ppt by eating chicken. He has been non compliant with his meds Pain relieved with MSO4 He is still urinating but less Indicates he does not want dialysis    Past Medical History:  Diagnosis Date  . CAD (coronary artery disease)    status post multiple myocardial infarctions  . Chronic kidney disease   . COPD (chronic obstructive pulmonary disease) ([place])   . Hyperlipidemia   . Hypertension   . Myocardial infarction ([place])    " I had 13 heart attacks, 8 massive"  . Proteinuria   . Renal insufficiency   . Secondary hyperparathyroidism of renal origin ([place])   . Shortness of breath dyspnea    " when I drink a cup of cold water too fast"  . Sleep apnea    pt stated " long story" does not wear CPAP  . Stroke [facility])     Past Surgical History:  Procedure Laterality Date  . AV FISTULA PLACEMENT Left 01-24-14   By Fernando. Hortencia Salas in [place]  . AV FISTULA PLACEMENT Right 04/30/2014   Procedure: RIGHT BRACHIOCEPHALIC ARTERIOVENOUS (AV) FISTULA CREATION;  Surgeon: Fernando Dutch, MD;  Location: [facility] OR;  Service: Vascular;  Laterality: Right;  . AV FISTULA PLACEMENT Left 05/31/2014   Procedure: Left INSERTION OF  ARTERIOVENOUS (AV) GORE-TEX GRAFT ARM;  Surgeon: Fernando Dutch, MD;  Location: [facility];  Service: Vascular;  Laterality: Left;  . CARDIAC CATHETERIZATION    . CORONARY STENT PLACEMENT     13 stents total     Home Medications:  Prior to Admission medications   Medication Sig Start Date End Date Taking? Authorizing Provider  amLODipine (NORVASC) 10 MG tablet Take 10 mg by mouth daily.    [provider]  atorvastatin (LIPITOR) 20 MG tablet Take 20 mg by mouth daily.    [provider]  carvedilol (COREG) 25 MG tablet Take 25 mg by mouth 2 (two) times daily. 02/19/14   [provider]  chlorthalidone (HYGROTON) 25 MG tablet Take 25 mg by mouth daily.  03/02/14   [provider]  [employer]S NON-ASPIRIN EXTRA STRENGTH 500 MG tablet Take 1,000 mg by mouth every 6 (six) hours as needed. for pain 01/14/16   [provider]  enalapril (VASOTEC) 10 MG tablet Take 10 mg by mouth daily.    [provider]  fluticasone (FLONASE) 50 MCG/ACT nasal spray INSTILL 1 SPRAY PER NARES DAILY 01/14/16   [provider]  nitroGLYCERIN (NITROSTAT) 0.4 MG SL tablet Place 0.4 mg under the tongue every 5 (five) minutes as needed for chest pain.    [provider]    Inpatient Medications: Scheduled  Meds: . amLODipine  10 mg Oral Daily  . [START ON 11/02/2016] aspirin EC  81 mg Oral Daily  . atorvastatin  40 mg Oral Daily  . carvedilol  25 mg Oral BID  . chlorthalidone  25 mg Oral Daily  . enalapril  10 mg Oral Daily  . fluticasone  1 spray Each Nare Daily  . ondansetron      . sodium chloride flush  3 mL Intravenous Q12H   Continuous Infusions: . sodium chloride    . heparin 1,350 Units/hr (11/01/16 0115)  . nitroGLYCERIN 30 mcg/min (11/01/16 0400)  . nitroGLYCERIN 5 mcg/min (11/01/16 0324)   PRN Meds: sodium chloride, ALPRAZolam, hydrALAZINE, nitroGLYCERIN, ondansetron (ZOFRAN) IV, sodium chloride flush  Allergies:    Allergies  Allergen  Reactions  . Ibuprofen Other (See Comments)    Kidney   . Viagra [Sildenafil]     Lips swell   . Clonidine Derivatives Rash    Social History:   Social History   Social History  . Marital status: Single    Spouse name: N/A  . Number of children: N/A  . Years of education: N/A   Occupational History  . Not on file.   Social History Main Topics  . Smoking status: Current Every Day Smoker    Packs/day: 0.50    Years: 25.00    Types: Cigarettes  . Smokeless tobacco: Never Used  . Alcohol use 0.0 oz/week     Comment: occasional  . Drug use: Yes    Types: Marijuana     Comment: daily  . Sexual activity: Not on file     Comment: " Smokes weed"   Other Topics Concern  . Not on file   Social History Narrative  . No narrative on file    Family History:    Family History  Problem Relation Age of Onset  . Heart disease Mother   . Hyperlipidemia Mother   . Hypertension Mother   . Kidney disease Father   . Kidney disease Brother   . Hyperlipidemia Sister   . Hyperlipidemia Daughter   . Hypertension Sister   . Hypertension Daughter      ROS:  Please see the history of present illness.  ROS  All other ROS reviewed and negative.     Physical Exam/Data:   Vitals:   11/01/16 0333 11/01/16 0359 11/01/16 0413 11/01/16 0725  BP: (!) 156/82 (!) 161/75 (!) 156/81 (!) 191/111  Pulse: 66 62 63 65  Resp:      Temp:      TempSrc:      SpO2:      Weight:      Height:        Intake/Output Summary (Last 24 hours) at 11/01/16 1106 Last data filed at 11/01/16 0700  Gross per 24 hour  Intake           158.63 ml  Output              300 ml  Net          -141.37 ml   Filed Weights   11/01/16 0058 11/01/16 0308  Weight: 230 lb (104.3 kg) 197 lb 1.6 oz (89.4 kg)   Body mass index is 28.28 kg/m.  General:  Chronically ill black male  HEENT: normal Lymph: no adenopathy Neck: no JVD Endocrine:  No thryomegaly Vascular: No carotid bruits; FA pulses 2+ bilaterally  without bruits  Cardiac:  normal S1, S2 S4 ; RRR; SEM  murmur  Lungs:  clear to auscultation bilaterally, no wheezing, rhonchi or rales  Abd: soft, nontender, no hepatomegaly  Ext: Fistula in LUE poor thrill  Musculoskeletal:  No deformities, BUE and BLE strength normal and equal Skin: warm and dry  Neuro:  CNs 2-12 intact, no focal abnormalities noted Psych:  Normal affect  Fistula LUE   EKG:  The EKG was personally reviewed and demonstrates:  NSR LVH lateral T wave changes  Telemetry:  Telemetry was personally reviewed and demonstrates:  NSR no arrhythmia  Relevant CV Studies: None  Laboratory Data:  Chemistry Recent Labs Lab 10/31/16 2319 11/01/16 0448  NA 139 138  K 4.5 4.7  CL 112* 114*  CO2 16* 16*  GLUCOSE 101* 115*  BUN 91* 89*  CREATININE 11.37* 11.16*  CALCIUM 7.7* 7.7*  GFRNONAA 5* 5*  GFRAA 5* 6*  ANIONGAP 11 8     Recent Labs Lab 10/31/16 2319  PROT 7.0  ALBUMIN 3.6  AST 26  ALT 27  ALKPHOS 53  BILITOT 0.6   Hematology Recent Labs Lab 10/31/16 2319 11/01/16 0448  WBC 5.7 5.2  RBC 3.22* 2.69*  HGB 10.0* 8.4*  HCT 30.2* 24.9*  MCV 93.9 92.7  MCH 31.1 31.3  MCHC 33.2 33.8  RDW 14.3 14.5  PLT 173 150   Cardiac Enzymes Recent Labs Lab 10/31/16 2319 11/01/16 0448  TROPONINI 0.08* 0.27*   No results for input(s): TROPIPOC in the last 168 hours.  BNP Recent Labs Lab 11/01/16 0448  BNP 2,699.0*    DDimer No results for input(s): DDIMER in the last 168 hours.  Radiology/Studies:  Dg Chest Port 1 View  Result Date: 11/01/2016 CLINICAL DATA:  Chest pain EXAM: PORTABLE CHEST 1 VIEW COMPARISON:  01/17/2014, 12/16/2006 FINDINGS: Cardiomegaly, increased compared to prior radiograph, with somewhat globular cardiac configuration. Central vascular congestion. Possible tiny pleural effusion. Linear atelectasis at the left base. No pneumothorax or focal infiltrate IMPRESSION: 1. Cardiomegaly, increased compared to prior, with central vascular  congestion and suspected tiny effusions. Cardiac configuration somewhat globular, this could be due to multi chamber enlargement or possible pericardial effusion. Correlation with echocardiogram could be obtained for further evaluation. Electronically Signed   By: Donavan Foil M.D.   On: 11/01/2016 00:00    Assessment and Plan:   1. Chest Pain: Doubt his history as we have no Epic records and nothing in [place] Where. His troponin is .27 which is not significant in setting of advance renal failure ECG with lateral T wave changes which are chronic Rx with heparin until troponin trend peaks. Not a candidate for cath as it would likely make him anuric 2. CRF:  No obvious rub on exam but nausea and chest pain could be from uremia Echo pending fistula LUE not likely functional Refuses dialysis  3. HTN;  Typically with Cr this high would not be on ACE change hygroton to lasix and add hydralazine and nitrates     Signed, Jenkins Rouge, MD  11/01/2016 11:06 AM

## 2016-11-01 NOTE — Progress Notes (Signed)
[facility]  ROUNDING NOTE   Subjective:  Patient well known to [place] from the office. We follow him for severe renal dysfunction and chronic kidney disease stage V.  He was last seen in the office in February. He has a nonfunctional AV fistula in the left upper extremity. At this point he states that he does not want any form of renal replacement therapy. Patient is aware of the outcome of untreated renal insufficiency.   Objective:  Vital signs in last 24 hours:  Temp:  [97.6 F (36.4 C)-98.1 F (36.7 C)] 98.1 F (36.7 C) (09/02 0308) Pulse Rate:  [62-75] 65 (09/02 0725) Resp:  [7-21] 13 (09/02 0308) BP: (156-217)/(75-121) 191/111 (09/02 0725) SpO2:  [93 %-100 %] 93 % (09/02 0322) Weight:  [89.4 kg (197 lb 1.6 oz)-104.3 kg (230 lb)] 89.4 kg (197 lb 1.6 oz) (09/02 0308)  Weight change:  Filed Weights   11/01/16 0058 11/01/16 0308  Weight: 104.3 kg (230 lb) 89.4 kg (197 lb 1.6 oz)    Intake/Output: I/O last 3 completed shifts: In: 158.6 [I.V.:158.6] Out: 300 [Urine:300]   Intake/Output this shift:  No intake/output data recorded.  Physical Exam: General: Chronically ill appearing  Head: Normocephalic, atraumatic. Moist oral mucosal membranes  Eyes: Anicteric  Neck: Supple, trachea midline  Lungs:  Clear to auscultation, normal effort  Heart: S1S2 no obvious pericardial friction rub  Abdomen:  Soft, nontender, bowel sounds present  Extremities: trace peripheral edema.  Neurologic: Awake, alert, following commands  Skin: No lesions  Access: Nonfunctional LUE AVF    Basic Metabolic Panel:  Recent Labs Lab 10/31/16 2319 11/01/16 0448  NA 139 138  K 4.5 4.7  CL 112* 114*  CO2 16* 16*  GLUCOSE 101* 115*  BUN 91* 89*  CREATININE 11.37* 11.16*  CALCIUM 7.7* 7.7*    Liver Function Tests:  Recent Labs Lab 10/31/16 2319  AST 26  ALT 27  ALKPHOS 53  BILITOT 0.6  PROT 7.0  ALBUMIN 3.6    Recent Labs Lab 10/31/16 2319  LIPASE 29   No  results for input(s): AMMONIA in the last 168 hours.  CBC:  Recent Labs Lab 10/31/16 2319 11/01/16 0448  WBC 5.7 5.2  NEUTROABS 3.8  --   HGB 10.0* 8.4*  HCT 30.2* 24.9*  MCV 93.9 92.7  PLT 173 150    Cardiac Enzymes:  Recent Labs Lab 10/31/16 2319 11/01/16 0448  TROPONINI 0.08* 0.27*    BNP: Invalid input(s): POCBNP  CBG: No results for input(s): GLUCAP in the last 168 hours.  Microbiology: No results found for this or any previous visit.  Coagulation Studies:  Recent Labs  10/31/16 2319 11/01/16 0448  LABPROT 15.1 16.6*  INR 1.20 1.35    Urinalysis: No results for input(s): COLORURINE, LABSPEC, PHURINE, GLUCOSEU, HGBUR, BILIRUBINUR, KETONESUR, PROTEINUR, UROBILINOGEN, NITRITE, LEUKOCYTESUR in the last 72 hours.  Invalid input(s): APPERANCEUR    Imaging: Dg Chest Port 1 View  Result Date: 11/01/2016 CLINICAL DATA:  Chest pain EXAM: PORTABLE CHEST 1 VIEW COMPARISON:  01/17/2014, 12/16/2006 FINDINGS: Cardiomegaly, increased compared to prior radiograph, with somewhat globular cardiac configuration. Central vascular congestion. Possible tiny pleural effusion. Linear atelectasis at the left base. No pneumothorax or focal infiltrate IMPRESSION: 1. Cardiomegaly, increased compared to prior, with central vascular congestion and suspected tiny effusions. Cardiac configuration somewhat globular, this could be due to multi chamber enlargement or possible pericardial effusion. Correlation with echocardiogram could be obtained for further evaluation. Electronically Signed   By: Maudie Mercury  Francoise Ceo M.D.   On: 11/01/2016 00:00     Medications:   . sodium chloride    . nitroGLYCERIN 5 mcg/min (11/01/16 0324)   . amLODipine  10 mg Oral Daily  . [START ON 11/02/2016] aspirin EC  81 mg Oral Daily  . atorvastatin  40 mg Oral Daily  . carvedilol  25 mg Oral BID  . fluticasone  1 spray Each Nare Daily  . furosemide  80 mg Oral Daily  . isosorbide-hydrALAZINE  1 tablet Oral TID   . ondansetron      . sodium chloride flush  3 mL Intravenous Q12H   sodium chloride, ALPRAZolam, hydrALAZINE, nitroGLYCERIN, ondansetron (ZOFRAN) IV, sodium chloride flush  Assessment/ Plan:  46 y.o. 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  ESRD:  The patient has severe renal dysfunction.  He's EGFR is currently 6. BNP is also quite elevated. No obvious pericardial friction rub however he could still have underlying pericardial effusion/pericarditis. We had a long discussion with the patient regarding options for renal replacement therapy. He declines all form of renal replacement therapy including hemodialysis, peritoneal dialysis, and transplant. He has significant reservations with dialysis as a family member did not do well with hemodialysis. Patient does understand the implications of untreated end-stage renal disease. Recommend obtaining palliative care consultation when available.  2. Metabolic acidosis. This is as a manifestation of his end-stage renal disease.  We could consider bicarbonate repletion however I'm hesitant to provide this at the moment as his blood pressure is quite high.  3. Hypertension.  Blood pressure noted to be quite high. Patient currently on amlodipine and carvedilol.  He is also on isosorbide/hydralazine. Patient allergic to clonidine.  4. Anemia of chronic kidney disease. Given elevated blood pressure would avoid erythropoietin stimulate agents at this time.  5. Patient has a very guarded prognosis.   LOS: 0 Akansha Wyche 9/2/201811:39 AM

## 2016-11-02 ENCOUNTER — Inpatient Hospital Stay: Admit: 2016-11-02 | Payer: Medicare Other

## 2016-11-02 ENCOUNTER — Ambulatory Visit: Payer: Medicare Other

## 2016-11-02 DIAGNOSIS — I214 Non-ST elevation (NSTEMI) myocardial infarction: Principal | ICD-10-CM

## 2016-11-02 LAB — GLUCOSE, CAPILLARY
Glucose-Capillary: 131 mg/dL — ABNORMAL HIGH (ref 65–99)
Glucose-Capillary: 116 mg/dL — ABNORMAL HIGH (ref 65–99)

## 2016-11-02 LAB — BASIC METABOLIC PANEL
Anion gap: 8 (ref 5–15)
BUN: 85 mg/dL — AB (ref 6–20)
CHLORIDE: 113 mmol/L — AB (ref 101–111)
CO2: 17 mmol/L — AB (ref 22–32)
CREATININE: 10.99 mg/dL — AB (ref 0.61–1.24)
Calcium: 8 mg/dL — ABNORMAL LOW (ref 8.9–10.3)
GFR calc Af Amer: 6 mL/min — ABNORMAL LOW (ref >60)
GFR calc non Af Amer: 5 mL/min — ABNORMAL LOW (ref >60)
GLUCOSE: 107 mg/dL — AB (ref 65–99)
Potassium: 4.3 mmol/L (ref 3.5–5.1)
Sodium: 138 mmol/L (ref 135–145)

## 2016-11-02 LAB — PLATELET COUNT: PLATELETS: 146 10*3/uL — AB (ref 150–440)

## 2016-11-02 LAB — HEMOGLOBIN: Hemoglobin: 8.7 g/dL — ABNORMAL LOW (ref 13.0–18.0)

## 2016-11-02 MED ORDER — CLOPIDOGREL BISULFATE 75 MG PO TABS
75.0000 mg | ORAL_TABLET | Freq: Every day | ORAL | Status: DC
  Administered 2016-11-02: 75 mg via ORAL
  Filled 2016-11-02: qty 1

## 2016-11-02 MED ORDER — ASPIRIN EC 325 MG PO TBEC
325.0000 mg | DELAYED_RELEASE_TABLET | Freq: Every day | ORAL | Status: DC
  Administered 2016-11-02: 325 mg via ORAL
  Filled 2016-11-02: qty 1

## 2016-11-02 MED ORDER — HEPARIN (PORCINE) IN NACL 100-0.45 UNIT/ML-% IJ SOLN: 1250.0000 [IU]/h | INTRAMUSCULAR | Status: DC

## 2016-11-02 MED ORDER — HEPARIN BOLUS VIA INFUSION
4000.0000 [IU] | Freq: Once | INTRAVENOUS | Status: DC
  Filled 2016-11-02: qty 4000

## 2016-11-02 MED ORDER — ISOSORB DINITRATE-HYDRALAZINE 20-37.5 MG PO TABS: 1.0000 | ORAL_TABLET | Freq: Three times a day (TID) | ORAL | 0 refills | Status: DC

## 2016-11-02 MED ORDER — NITROGLYCERIN 0.4 MG SL SUBL: 0.4000 mg | SUBLINGUAL_TABLET | SUBLINGUAL | Status: DC | PRN

## 2016-11-02 MED ORDER — CLOPIDOGREL BISULFATE 75 MG PO TABS: 75.0000 mg | ORAL_TABLET | Freq: Every day | ORAL | 2 refills | Status: AC

## 2016-11-02 MED ORDER — FUROSEMIDE 80 MG PO TABS: 80.0000 mg | ORAL_TABLET | Freq: Every day | ORAL | 1 refills | Status: DC

## 2016-11-02 MED ORDER — ASPIRIN 81 MG PO TBEC: 81.0000 mg | DELAYED_RELEASE_TABLET | Freq: Every day | ORAL | 2 refills | Status: DC

## 2016-11-02 MED ORDER — ATORVASTATIN CALCIUM 40 MG PO TABS: 40.0000 mg | ORAL_TABLET | Freq: Every day | ORAL | 2 refills | Status: AC

## 2016-11-02 NOTE — Plan of Care (Signed)
Problem: Education: Goal: Knowledge of [facility] Education information/materials will improve Outcome: Progressing Patient refuse to have dialysis. Cardiac cath can not be performed. Patient verbalizes understanding of not having cardiac cath. Patient educated on taking blood pressure medications and when to follow up with MD.

## 2016-11-02 NOTE — Plan of Care (Signed)
Problem: Safety: Goal: Ability to remain free from injury will improve Outcome: Progressing Patient will be free from falls.

## 2016-11-02 NOTE — Discharge Summary (Signed)
[facility] [place] NAME: Fernando Salas    MR#:  [id number]  DATE OF BIRTH:  [birth year]  DATE OF ADMISSION:  10/31/2016   ADMITTING PHYSICIAN: Harvie Bridge, DO  DATE OF DISCHARGE: 11/02/2016  PRIMARY CARE PHYSICIAN: [name], MD   ADMISSION DIAGNOSIS:  Unstable angina ([place]) [I20.0] Hypertensive urgency [I16.0] Essential hypertension [I10] Chest pain, unspecified type [R07.9] DISCHARGE DIAGNOSIS:  Active Problems:   Unstable angina (Fernando Salas) NTEMI SECONDARY DIAGNOSIS:   Past Medical History:  Diagnosis Date  . CAD (coronary artery disease)    status post multiple myocardial infarctions  . Chronic kidney disease   . COPD (chronic obstructive pulmonary disease) ([place])   . Hyperlipidemia   . Hypertension   . Myocardial infarction ([place])    " I had 13 heart attacks, 8 massive"  . Proteinuria   . Renal insufficiency   . Secondary hyperparathyroidism of renal origin ([place])   . Shortness of breath dyspnea    " when I drink a cup of cold water too fast"  . Sleep apnea    pt stated " long story" does not wear CPAP  . Stroke [facility])    HOSPITAL COURSE:  This is a 46 y.o.malewith a history of coronary artery disease status post MI, hypertension, hyperlipidemia, COPD, chronic kidney disease with an immature fistula, CVAnow being admitted with:  #. NSTEMI with a history of coronary artery disease, s/p multiple stent placement.  Troponins are trending up. Add plavix per Dr. Fletcher Anon. continue ASA, Lipitor, nitro, Coreg. Discontinued heparin drip and Not a candidate for cath per Dr. Johnsie Cancel. Dr. Fletcher Anon discussed with the patient the finding of increased troponin and recommended proceeding with cardiac catheterization and possible coronary intervention. However, the patient refuses as he does not want to take any chances with the possibility of going on dialysis. He absolutely refuses dialysis and wants to be treated medically.  #.  Hypertensive urgency On Nitroglycerin paste and Hydralazine PRN Started lasix and Bidil, resumed Coreg,  Norvasc.  discontinued chlorthalidone and enalapril. improved.  #. ESRD  The patient still declined hemodialysis.  #. Anemia of chronic disease. Hb down to 8.4, repeat 8.7 today. No active bleeding. Follow-up hemoglobin as outpatient.  #. History of hyperlipidemia - Continue lipitor  COPD. Stable, NEB when necessary. Tobacco abuse. Smoking cessation was counseled for 3-4 minutes.  I discussed with Dr. Fletcher Anon and Dr. Holley Raring.  DISCHARGE CONDITIONS:  He absolutely refuses dialysis and wants to go home. Discharge to home today. CONSULTS OBTAINED:  Treatment Team:  Josue Hector, MD Anthonette Legato, MD DRUG ALLERGIES:   Allergies  Allergen Reactions  . Ibuprofen Other (See Comments)    Kidney   . Viagra [Sildenafil]     Lips swell   . Clonidine Derivatives Rash   DISCHARGE MEDICATIONS:   Allergies as of 11/02/2016      Reactions   Ibuprofen Other (See Comments)   Kidney    Viagra [sildenafil]    Lips swell    Clonidine Derivatives Rash      Medication List    STOP taking these medications   chlorthalidone 25 MG tablet Commonly known as:  HYGROTON   enalapril 10 MG tablet Commonly known as:  VASOTEC     TAKE these medications   amLODipine 10 MG tablet Commonly known as:  NORVASC Take 10 mg by mouth daily.   aspirin 81 MG EC tablet Take 1 tablet (81 mg total) by mouth daily.  atorvastatin 40 MG tablet Commonly known as:  LIPITOR Take 1 tablet (40 mg total) by mouth daily. What changed:  medication strength  how much to take   carvedilol 25 MG tablet Commonly known as:  COREG Take 25 mg by mouth 2 (two) times daily.   clopidogrel 75 MG tablet Commonly known as:  PLAVIX Take 1 tablet (75 mg total) by mouth daily.   [employer]S NON-ASPIRIN EXTRA STRENGTH 500 MG tablet Generic drug:  acetaminophen Take 1,000 mg by mouth every 6 (six) hours as  needed. for pain   fluticasone 50 MCG/ACT nasal spray Commonly known as:  FLONASE INSTILL 1 SPRAY PER NARES DAILY   furosemide 80 MG tablet Commonly known as:  LASIX Take 1 tablet (80 mg total) by mouth daily.   isosorbide-hydrALAZINE 20-37.5 MG tablet Commonly known as:  BIDIL Take 1 tablet by mouth 3 (three) times daily.   nitroGLYCERIN 0.4 MG SL tablet Commonly known as:  NITROSTAT Place 0.4 mg under the tongue every 5 (five) minutes as needed for chest pain.            Discharge Care Instructions        Start     Ordered   11/03/16 0000  atorvastatin (LIPITOR) 40 MG tablet  Daily     11/02/16 1126   11/03/16 0000  aspirin EC 81 MG EC tablet  Daily     11/02/16 1126   11/03/16 0000  clopidogrel (PLAVIX) 75 MG tablet  Daily     11/02/16 1126   11/03/16 0000  furosemide (LASIX) 80 MG tablet  Daily     11/02/16 1126   11/02/16 0000  Increase activity slowly     11/02/16 1120   11/02/16 0000  Diet - low sodium heart healthy     11/02/16 1120   11/02/16 0000  isosorbide-hydrALAZINE (BIDIL) 20-37.5 MG tablet  3 times daily     11/02/16 1126       DISCHARGE INSTRUCTIONS:  See AVS.  If you experience worsening of your admission symptoms, develop shortness of breath, life threatening emergency, suicidal or homicidal thoughts you must seek medical attention immediately by calling 911 or calling your MD immediately  if symptoms less severe.  You Must read complete instructions/literature along with all the possible adverse reactions/side effects for all the Medicines you take and that have been prescribed to you. Take any new Medicines after you have completely understood and accpet all the possible adverse reactions/side effects.   Please note  You were cared for by a hospitalist during your hospital stay. If you have any questions about your discharge medications or the care you received while you were in the hospital after you are discharged, you can call the unit  and asked to speak with the hospitalist on call if the hospitalist that took care of you is not available. Once you are discharged, your primary care physician will handle any further medical issues. Please note that NO REFILLS for any discharge medications will be authorized once you are discharged, as it is imperative that you return to your primary care physician (or establish a relationship with a primary care physician if you do not have one) for your aftercare needs so that they can reassess your need for medications and monitor your lab values.    On the day of Discharge:  VITAL SIGNS:  Blood pressure (!) 168/99, pulse 66, temperature (!) 97.5 F (36.4 C), temperature source Oral, resp. rate 20, height 5\' 10"  (1.778  m), weight 192 lb 14.4 oz (87.5 kg), SpO2 97 %. PHYSICAL EXAMINATION:  GENERAL:  46 y.o.-year-old patient lying in the bed with no acute distress.  EYES: Pupils equal, round, reactive to light and accommodation. No scleral icterus. Extraocular muscles intact.  HEENT: Head atraumatic, normocephalic. Oropharynx and nasopharynx clear.  NECK:  Supple, no jugular venous distention. No thyroid enlargement, no tenderness.  LUNGS: Normal breath sounds bilaterally, no wheezing, rales,rhonchi or crepitation. No use of accessory muscles of respiration.  CARDIOVASCULAR: S1, S2 normal. No murmurs, rubs, or gallops.  ABDOMEN: Soft, non-tender, non-distended. Bowel sounds present. No organomegaly or mass.  EXTREMITIES: No pedal edema, cyanosis, or clubbing.  NEUROLOGIC: Cranial nerves II through XII are intact. Muscle strength 5/5 in all extremities. Sensation intact. Gait not checked.  PSYCHIATRIC: The patient is alert and oriented x 3.  SKIN: No obvious rash, lesion, or ulcer.  DATA REVIEW:   CBC  Recent Labs Lab 11/01/16 0448 11/02/16 0544  WBC 5.2  --   HGB 8.4* 8.7*  HCT 24.9*  --   PLT 150 146*    Chemistries   Recent Labs Lab 10/31/16 2319  11/02/16 0544  NA 139  <  > 138  K 4.5  < > 4.3  CL 112*  < > 113*  CO2 16*  < > 17*  GLUCOSE 101*  < > 107*  BUN 91*  < > 85*  CREATININE 11.37*  < > 10.99*  CALCIUM 7.7*  < > 8.0*  AST 26  --   --   ALT 27  --   --   ALKPHOS 53  --   --   BILITOT 0.6  --   --   < > = values in this interval not displayed.   Microbiology Results  No results found for this or any previous visit.  RADIOLOGY:  No results found.   Management plans discussed with the patient, family and they are in agreement.  CODE STATUS: Full Code   TOTAL TIME TAKING CARE OF THIS PATIENT: 37 minutes.    Demetrios Loll M.D on 11/02/2016 at 12:18 PM  Between 7am to 6pm - Pager - [phone]  After 6pm go to [email or web address] - Proofreader  [facility]  Office  [phone]  CC: Primary care physician; [name], MD   Note: This dictation was prepared with Dragon dictation along with smaller phrase technology. Any transcriptional errors that result from this process are unintentional.

## 2016-11-02 NOTE — Discharge Instructions (Signed)
Heart healthy and renal diet. Compliance to medication. Smoking cessation.

## 2016-11-02 NOTE — Care Management Important Message (Signed)
Important Message  Patient Details  Name: Fernando Salas MRN: [id number] Date of Birth: [birth year]   Medicare Important Message Given:  N/A - LOS <3 / Initial given by admissions    Katrina Stack, RN 11/02/2016, 11:53 AM

## 2016-11-02 NOTE — Progress Notes (Signed)
Patient refuses to have telemetry leads on. Patient educated on the reasons for cardiac monitoring. Risk and benefits discussed with.  MD aware.

## 2016-11-02 NOTE — Progress Notes (Signed)
[place] for heparin drip Indication: chest pain/ACS  Allergies  Allergen Reactions  . Ibuprofen Other (See Comments)    Kidney   . Viagra [Sildenafil]     Lips swell   . Clonidine Derivatives Rash    Patient Measurements: Height: 5\' 10"  (177.8 cm) Weight: 192 lb 14.4 oz (87.5 kg) IBW/kg (Calculated) : 73 Heparin Dosing Weight: 89.4 kg  Vital Signs: Temp: 97.8 F (36.6 C) (09/03 0753) Temp Source: Oral (09/03 0753) BP: 176/88 (09/03 0753) Pulse Rate: 60 (09/03 0750)  Labs:  Recent Labs  10/31/16 2319 11/01/16 0448 11/01/16 0909 11/01/16 1102 11/01/16 1715 11/02/16 0544  HGB 10.0* 8.4*  --   --   --  8.7*  HCT 30.2* 24.9*  --   --   --   --   PLT 173 150  --   --   --   --   APTT 36  --   --   --   --   --   LABPROT 15.1 16.6*  --   --   --   --   INR 1.20 1.35  --   --   --   --   HEPARINUNFRC  --   --  <0.10*  --   --   --   CREATININE 11.37* 11.16*  --   --   --  10.99*  TROPONINI 0.08* 0.27*  --  1.73* 3.81*  --     Estimated Creatinine Clearance: 8.7 mL/min (A) (by C-G formula based on SCr of 10.99 mg/dL (H)).   Medical History: Past Medical History:  Diagnosis Date  . CAD (coronary artery disease)    status post multiple myocardial infarctions  . Chronic kidney disease   . COPD (chronic obstructive pulmonary disease) ([place])   . Hyperlipidemia   . Hypertension   . Myocardial infarction ([place])    " I had 13 heart attacks, 8 massive"  . Proteinuria   . Renal insufficiency   . Secondary hyperparathyroidism of renal origin ([place])   . Shortness of breath dyspnea    " when I drink a cup of cold water too fast"  . Sleep apnea    pt stated " long story" does not wear CPAP  . Stroke [facility])     Medications:  No anticoagulation in available PTA med list  Assessment: 46 yo male here with chest pain with hx of CAD s/p MI  Goal of Therapy:  Heparin level 0.3-0.7 units/ml Monitor platelets by anticoagulation  protocol: Yes   Plan:  9/2: 4000 unit bolus and initial rate of 1350 units/hr. First heparin level 8 hours after start of infusion  9/2 0909: HL <0.10 Spoke with RN who stated heparin drip is running at 8.4 ml/hr which does not match order for 13.5 ml/hr. Weight also decreased. Heparin drip rate adjusted to 1250 units/hr. No s/sx of bleeding noted per RN. Discussed Hgb drop with cardiologist.  9/3 0813: Restarting heparin gtt per pharmacist consult. 4000 unit bolus and re-starting rate at 1250units/hr will order 8 hour HL. Hgb has been discussed yesterday per comment above.   Thomasenia Sales, PharmD, MBA, [facility]

## 2016-11-02 NOTE — Progress Notes (Signed)
Discussed Hgb drop from Saturday to Sunday with Hospitalist on the floor, per MD pt with no active bleeding.

## 2016-11-02 NOTE — Care Management (Signed)
There was a consult for CM for medication needs.  Patient discharged before CM could assess because he is firmly declining to initiate dialysis treatments and without he is not able to undergo work up (cardiac cath)  for the elevated troponins.  Patient chose not to wait to speak with the care manager

## 2016-11-02 NOTE — Progress Notes (Signed)
Discharge instructions reviewed with patient. Patient verbalized understanding. IV removed, pressure dressing applied. NAD noted upon discharge.

## 2016-11-02 NOTE — Progress Notes (Signed)
Progress Note  Patient Name: Fernando Salas Date of Encounter: 11/02/2016  Primary Cardiologist: : The patient does not know  Subjective   He had prolonged chest pain yesterday but none today. Troponin increased to 3.8.  Inpatient Medications    Scheduled Meds: . amLODipine  10 mg Oral Daily  . aspirin EC  325 mg Oral Daily  . atorvastatin  40 mg Oral Daily  . carvedilol  25 mg Oral BID  . clopidogrel  75 mg Oral Daily  . fluticasone  1 spray Each Nare Daily  . furosemide  80 mg Oral Daily  . isosorbide-hydrALAZINE  1 tablet Oral TID  . nitroGLYCERIN  0.5 inch Topical Q6H  . sodium chloride flush  3 mL Intravenous Q12H   Continuous Infusions: . sodium chloride     PRN Meds: sodium chloride, acetaminophen, ALPRAZolam, hydrALAZINE, ondansetron (ZOFRAN) IV, sodium chloride flush   Vital Signs    Vitals:   11/02/16 0500 11/02/16 0524 11/02/16 0750 11/02/16 0753  BP:  (!) 165/78 (!) 176/88 (!) 176/88  Pulse:  (!) 57 60   Resp:      Temp:  99.1 F (37.3 C) 97.8 F (36.6 C) 97.8 F (36.6 C)  TempSrc:  Oral Oral Oral  SpO2:  98% 97%   Weight: 192 lb 14.4 oz (87.5 kg)     Height:        Intake/Output Summary (Last 24 hours) at 11/02/16 1003 Last data filed at 11/02/16 0959  Gross per 24 hour  Intake              819 ml  Output              925 ml  Net             -106 ml   Filed Weights   11/01/16 0058 11/01/16 0308 11/02/16 0500  Weight: 230 lb (104.3 kg) 197 lb 1.6 oz (89.4 kg) 192 lb 14.4 oz (87.5 kg)    Telemetry    Normal sinus rhythm. - Personally Reviewed  ECG    Normal sinus rhythm with PVCs. Nonspecific T wave changes - Personally Reviewed  Physical Exam   GEN: No acute distress.   Neck: No JVD Cardiac: RRR, no rubs, or gallops.  One out of 6 systolic murmur at the base Respiratory: Clear to auscultation bilaterally. GI: Soft, nontender, non-distended  MS: No edema; No deformity. Neuro:  Nonfocal  Psych: Normal affect   Labs      Chemistry Recent Labs Lab 10/31/16 2319 11/01/16 0448 11/02/16 0544  NA 139 138 138  K 4.5 4.7 4.3  CL 112* 114* 113*  CO2 16* 16* 17*  GLUCOSE 101* 115* 107*  BUN 91* 89* 85*  CREATININE 11.37* 11.16* 10.99*  CALCIUM 7.7* 7.7* 8.0*  PROT 7.0  --   --   ALBUMIN 3.6  --   --   AST 26  --   --   ALT 27  --   --   ALKPHOS 53  --   --   BILITOT 0.6  --   --   GFRNONAA 5* 5* 5*  GFRAA 5* 6* 6*  ANIONGAP 11 8 8      Hematology Recent Labs Lab 10/31/16 2319 11/01/16 0448 11/02/16 0544  WBC 5.7 5.2  --   RBC 3.22* 2.69*  --   HGB 10.0* 8.4* 8.7*  HCT 30.2* 24.9*  --   MCV 93.9 92.7  --   MCH 31.1 31.3  --  MCHC 33.2 33.8  --   RDW 14.3 14.5  --   PLT 173 150 146*    Cardiac Enzymes Recent Labs Lab 10/31/16 2319 11/01/16 0448 11/01/16 1102 11/01/16 1715  TROPONINI 0.08* 0.27* 1.73* 3.81*   No results for input(s): TROPIPOC in the last 168 hours.   BNP Recent Labs Lab 11/01/16 0448  BNP 2,699.0*     DDimer No results for input(s): DDIMER in the last 168 hours.   Radiology    Dg Chest Port 1 View  Result Date: 11/01/2016 CLINICAL DATA:  Chest pain EXAM: PORTABLE CHEST 1 VIEW COMPARISON:  01/17/2014, 12/16/2006 FINDINGS: Cardiomegaly, increased compared to prior radiograph, with somewhat globular cardiac configuration. Central vascular congestion. Possible tiny pleural effusion. Linear atelectasis at the left base. No pneumothorax or focal infiltrate IMPRESSION: 1. Cardiomegaly, increased compared to prior, with central vascular congestion and suspected tiny effusions. Cardiac configuration somewhat globular, this could be due to multi chamber enlargement or possible pericardial effusion. Correlation with echocardiogram could be obtained for further evaluation. Electronically Signed   By: Donavan Foil M.D.   On: 11/01/2016 00:00    Cardiac Studies   Echo is pending  Patient Profile     46 y.o. male with reported history of coronary artery disease with  multiple MIs in the past and stent placement according to the patient but not able to substantiate that with any records from here or care everywhere. He also has end-stage renal disease and refuses hemodialysis. He presented with chest pain and was found to have mildly elevated troponin which increased to 3.8.  Assessment & Plan    1. Non-ST elevation myocardial infarction: The patient is currently on optimal medical therapy but they elected to add Plavix 75 mg once daily. I discussed with the patient the finding of increased troponin and recommended proceeding with cardiac catheterization and possible coronary intervention. However, the patient refuses as he does not want to take any chances with the possibility of going on dialysis. He absolutely refuses dialysis and wants to be treated medically. He understands that this is a life-threatening situation and according to him he is going to "leave it in the hands of God".  He wants to go home. No further recommendations from my standpoint.  2. End-stage renal disease: He refuses hemodialysis and he understands the implications. He reports that multiple family members had very bad outcome with dialysis and that does not take any difference and the overall outcome.  3. Essential hypertension: Blood pressure is not controlled likely due to end-stage renal disease. Continue current medications.  Signed, Kathlyn Sacramento, MD  11/02/2016, 10:03 AM

## 2016-11-02 NOTE — Progress Notes (Signed)
[facility]  ROUNDING NOTE   Subjective:  Troponin up to 3.81 at the moment. Cardiology has seen the patient. Cardiac catheterization would likely further deteriorate his renal function and the patient does not want to perform dialysis. In addition he does not want cardiac catheterization either.   Objective:  Vital signs in last 24 hours:  Temp:  [97.8 F (36.6 C)-99.1 F (37.3 C)] 97.8 F (36.6 C) (09/03 0753) Pulse Rate:  [57-66] 60 (09/03 0750) BP: (144-176)/(72-99) 176/88 (09/03 0753) SpO2:  [97 %-99 %] 97 % (09/03 0750) Weight:  [87.5 kg (192 lb 14.4 oz)] 87.5 kg (192 lb 14.4 oz) (09/03 0500)  Weight change: -16.828 kg (-37 lb 1.6 oz) Filed Weights   11/01/16 0058 11/01/16 0308 11/02/16 0500  Weight: 104.3 kg (230 lb) 89.4 kg (197 lb 1.6 oz) 87.5 kg (192 lb 14.4 oz)    Intake/Output: I/O last 3 completed shifts: In: 497.6 [P.O.:240; I.V.:257.6] Out: 1625 [Urine:1625]   Intake/Output this shift:  Total I/O In: 480 [P.O.:480] Out: -   Physical Exam: General: Chronically ill appearing  Head: Normocephalic, atraumatic. Moist oral mucosal membranes  Eyes: Anicteric  Neck: Supple, trachea midline  Lungs:  Clear to auscultation, normal effort  Heart: S1S2 no obvious pericardial friction rub  Abdomen:  Soft, nontender, bowel sounds present  Extremities: trace peripheral edema.  Neurologic: Awake, alert, following commands  Skin: No lesions  Access: Nonfunctional LUE AVF    Basic Metabolic Panel:  Recent Labs Lab 10/31/16 2319 11/01/16 0448 11/02/16 0544  NA 139 138 138  K 4.5 4.7 4.3  CL 112* 114* 113*  CO2 16* 16* 17*  GLUCOSE 101* 115* 107*  BUN 91* 89* 85*  CREATININE 11.37* 11.16* 10.99*  CALCIUM 7.7* 7.7* 8.0*    Liver Function Tests:  Recent Labs Lab 10/31/16 2319  AST 26  ALT 27  ALKPHOS 53  BILITOT 0.6  PROT 7.0  ALBUMIN 3.6    Recent Labs Lab 10/31/16 2319  LIPASE 29   No results for input(s): AMMONIA in the  last 168 hours.  CBC:  Recent Labs Lab 10/31/16 2319 11/01/16 0448 11/02/16 0544  WBC 5.7 5.2  --   NEUTROABS 3.8  --   --   HGB 10.0* 8.4* 8.7*  HCT 30.2* 24.9*  --   MCV 93.9 92.7  --   PLT 173 150 146*    Cardiac Enzymes:  Recent Labs Lab 10/31/16 2319 11/01/16 0448 11/01/16 1102 11/01/16 1715  TROPONINI 0.08* 0.27* 1.73* 3.81*    BNP: Invalid input(s): POCBNP  CBG:  Recent Labs Lab 11/02/16 0801  GLUCAP 131*    Microbiology: No results found for this or any previous visit.  Coagulation Studies:  Recent Labs  10/31/16 2319 11/01/16 0448  LABPROT 15.1 16.6*  INR 1.20 1.35    Urinalysis: No results for input(s): COLORURINE, LABSPEC, PHURINE, GLUCOSEU, HGBUR, BILIRUBINUR, KETONESUR, PROTEINUR, UROBILINOGEN, NITRITE, LEUKOCYTESUR in the last 72 hours.  Invalid input(s): APPERANCEUR    Imaging: Dg Chest Port 1 View  Result Date: 11/01/2016 CLINICAL DATA:  Chest pain EXAM: PORTABLE CHEST 1 VIEW COMPARISON:  01/17/2014, 12/16/2006 FINDINGS: Cardiomegaly, increased compared to prior radiograph, with somewhat globular cardiac configuration. Central vascular congestion. Possible tiny pleural effusion. Linear atelectasis at the left base. No pneumothorax or focal infiltrate IMPRESSION: 1. Cardiomegaly, increased compared to prior, with central vascular congestion and suspected tiny effusions. Cardiac configuration somewhat globular, this could be due to multi chamber enlargement or possible pericardial effusion. Correlation with echocardiogram  could be obtained for further evaluation. Electronically Signed   By: Donavan Foil M.D.   On: 11/01/2016 00:00     Medications:   . sodium chloride     . amLODipine  10 mg Oral Daily  . aspirin EC  325 mg Oral Daily  . atorvastatin  40 mg Oral Daily  . carvedilol  25 mg Oral BID  . clopidogrel  75 mg Oral Daily  . fluticasone  1 spray Each Nare Daily  . furosemide  80 mg Oral Daily  . isosorbide-hydrALAZINE  1  tablet Oral TID  . nitroGLYCERIN  0.5 inch Topical Q6H  . sodium chloride flush  3 mL Intravenous Q12H   sodium chloride, acetaminophen, ALPRAZolam, hydrALAZINE, ondansetron (ZOFRAN) IV, sodium chloride flush  Assessment/ Plan:  46 y.o. 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  ESRD:  The patient has severe renal dysfunction.  As summarized previously the patient has very poor renal function. We have offered him renal replacement therapy however patient has declined multiple offerings. He states that multiple family members have had poor outcomes with dialysis. He understands the consequences of untreated renal failure which includes death. He is willing to accept to this outcome.  2. Metabolic acidosis. Serum bicarbonate currently 17 however we do not advise giving sodium bicarbonate given elevated blood pressures.  3. Hypertension.  Continue the patient on amlodipine, carvedilol, and BiDil. Patient cannot take clonidine given allergy.  4. Anemia of chronic kidney disease. No indication for Procrit given elevated blood pressures  5. Patient has a very guarded prognosis.   LOS: 1 Fernando Salas 9/3/201811:02 AM

## 2016-11-03 LAB — HIV ANTIBODY (ROUTINE TESTING W REFLEX): HIV SCREEN 4TH GENERATION: NONREACTIVE

## 2017-01-12 ENCOUNTER — Emergency Department: Payer: Medicare Other

## 2017-01-12 ENCOUNTER — Inpatient Hospital Stay
Admission: EM | Admit: 2017-01-12 | Discharge: 2017-01-16 | DRG: 291 | Disposition: A | Discharge status: Home / Self Care | Payer: Medicare Other | Attending: Internal Medicine | Admitting: Internal Medicine

## 2017-01-12 ENCOUNTER — Encounter: Payer: Self-pay | Admitting: Emergency Medicine

## 2017-01-12 ENCOUNTER — Other Ambulatory Visit: Payer: Self-pay

## 2017-01-12 DIAGNOSIS — Z992 Dependence on renal dialysis: Secondary | ICD-10-CM

## 2017-01-12 DIAGNOSIS — I2510 Atherosclerotic heart disease of native coronary artery without angina pectoris: Secondary | ICD-10-CM | POA: Diagnosis present

## 2017-01-12 DIAGNOSIS — I10 Essential (primary) hypertension: Secondary | ICD-10-CM | POA: Diagnosis not present

## 2017-01-12 DIAGNOSIS — I132 Hypertensive heart and chronic kidney disease with heart failure and with stage 5 chronic kidney disease, or end stage renal disease: Principal | ICD-10-CM | POA: Diagnosis present

## 2017-01-12 DIAGNOSIS — G4733 Obstructive sleep apnea (adult) (pediatric): Secondary | ICD-10-CM | POA: Diagnosis present

## 2017-01-12 DIAGNOSIS — E785 Hyperlipidemia, unspecified: Secondary | ICD-10-CM | POA: Diagnosis present

## 2017-01-12 DIAGNOSIS — Z7951 Long term (current) use of inhaled steroids: Secondary | ICD-10-CM

## 2017-01-12 DIAGNOSIS — D509 Iron deficiency anemia, unspecified: Secondary | ICD-10-CM | POA: Diagnosis present

## 2017-01-12 DIAGNOSIS — Z886 Allergy status to analgesic agent status: Secondary | ICD-10-CM

## 2017-01-12 DIAGNOSIS — R197 Diarrhea, unspecified: Secondary | ICD-10-CM | POA: Diagnosis not present

## 2017-01-12 DIAGNOSIS — Z7902 Long term (current) use of antithrombotics/antiplatelets: Secondary | ICD-10-CM | POA: Diagnosis not present

## 2017-01-12 DIAGNOSIS — J449 Chronic obstructive pulmonary disease, unspecified: Secondary | ICD-10-CM | POA: Diagnosis present

## 2017-01-12 DIAGNOSIS — F17210 Nicotine dependence, cigarettes, uncomplicated: Secondary | ICD-10-CM | POA: Diagnosis present

## 2017-01-12 DIAGNOSIS — Z79899 Other long term (current) drug therapy: Secondary | ICD-10-CM | POA: Diagnosis not present

## 2017-01-12 DIAGNOSIS — R778 Other specified abnormalities of plasma proteins: Secondary | ICD-10-CM

## 2017-01-12 DIAGNOSIS — R109 Unspecified abdominal pain: Secondary | ICD-10-CM | POA: Diagnosis not present

## 2017-01-12 DIAGNOSIS — Z955 Presence of coronary angioplasty implant and graft: Secondary | ICD-10-CM

## 2017-01-12 DIAGNOSIS — Z7982 Long term (current) use of aspirin: Secondary | ICD-10-CM

## 2017-01-12 DIAGNOSIS — I252 Old myocardial infarction: Secondary | ICD-10-CM | POA: Diagnosis not present

## 2017-01-12 DIAGNOSIS — F1210 Cannabis abuse, uncomplicated: Secondary | ICD-10-CM | POA: Diagnosis present

## 2017-01-12 DIAGNOSIS — Z8249 Family history of ischemic heart disease and other diseases of the circulatory system: Secondary | ICD-10-CM | POA: Diagnosis not present

## 2017-01-12 DIAGNOSIS — N2581 Secondary hyperparathyroidism of renal origin: Secondary | ICD-10-CM | POA: Diagnosis present

## 2017-01-12 DIAGNOSIS — Z8673 Personal history of transient ischemic attack (TIA), and cerebral infarction without residual deficits: Secondary | ICD-10-CM

## 2017-01-12 DIAGNOSIS — D631 Anemia in chronic kidney disease: Secondary | ICD-10-CM | POA: Diagnosis present

## 2017-01-12 DIAGNOSIS — N179 Acute kidney failure, unspecified: Secondary | ICD-10-CM | POA: Diagnosis not present

## 2017-01-12 DIAGNOSIS — I509 Heart failure, unspecified: Secondary | ICD-10-CM | POA: Diagnosis present

## 2017-01-12 DIAGNOSIS — N186 End stage renal disease: Secondary | ICD-10-CM | POA: Diagnosis not present

## 2017-01-12 DIAGNOSIS — R1110 Vomiting, unspecified: Secondary | ICD-10-CM | POA: Diagnosis not present

## 2017-01-12 DIAGNOSIS — Z888 Allergy status to other drugs, medicaments and biological substances status: Secondary | ICD-10-CM

## 2017-01-12 DIAGNOSIS — R0602 Shortness of breath: Secondary | ICD-10-CM | POA: Diagnosis not present

## 2017-01-12 DIAGNOSIS — I161 Hypertensive emergency: Secondary | ICD-10-CM | POA: Diagnosis present

## 2017-01-12 DIAGNOSIS — Z9119 Patient's noncompliance with other medical treatment and regimen: Secondary | ICD-10-CM

## 2017-01-12 DIAGNOSIS — R7989 Other specified abnormal findings of blood chemistry: Secondary | ICD-10-CM

## 2017-01-12 DIAGNOSIS — K529 Noninfective gastroenteritis and colitis, unspecified: Secondary | ICD-10-CM

## 2017-01-12 LAB — COMPREHENSIVE METABOLIC PANEL
ALK PHOS: 66 U/L (ref 38–126)
ALT: 26 U/L (ref 17–63)
ANION GAP: 12 (ref 5–15)
AST: 23 U/L (ref 15–41)
Albumin: 3.5 g/dL (ref 3.5–5.0)
BUN: 117 mg/dL — AB (ref 6–20)
CO2: 15 mmol/L — ABNORMAL LOW (ref 22–32)
Calcium: 8.5 mg/dL — ABNORMAL LOW (ref 8.9–10.3)
Chloride: 111 mmol/L (ref 101–111)
Creatinine, Ser: 13.52 mg/dL — ABNORMAL HIGH (ref 0.61–1.24)
GFR calc Af Amer: 4 mL/min — ABNORMAL LOW (ref >60)
GFR calc non Af Amer: 4 mL/min — ABNORMAL LOW (ref >60)
Glucose, Bld: 112 mg/dL — ABNORMAL HIGH (ref 65–99)
Potassium: 5.1 mmol/L (ref 3.5–5.1)
Sodium: 138 mmol/L (ref 135–145)
TOTAL PROTEIN: 7.2 g/dL (ref 6.5–8.1)
Total Bilirubin: 1 mg/dL (ref 0.3–1.2)

## 2017-01-12 LAB — PHOSPHORUS
PHOSPHORUS: 6 mg/dL — AB (ref 2.5–4.6)
PHOSPHORUS: 4.5 mg/dL (ref 2.5–4.6)

## 2017-01-12 LAB — URINALYSIS, COMPLETE (UACMP) WITH MICROSCOPIC
BACTERIA UA: NONE SEEN
Bilirubin Urine: NEGATIVE
Glucose, UA: 50 mg/dL — AB
Ketones, ur: NEGATIVE mg/dL
Leukocytes, UA: NEGATIVE
NITRITE: NEGATIVE
PH: 5 (ref 5.0–8.0)
Protein, ur: >=300 mg/dL — AB
SPECIFIC GRAVITY, URINE: 1.01 (ref 1.005–1.030)
SQUAMOUS EPITHELIAL / LPF: NONE SEEN

## 2017-01-12 LAB — CBC WITH DIFFERENTIAL/PLATELET
Basophils Absolute: 0.1 10*3/uL (ref 0–0.1)
Basophils Relative: 1 %
EOS ABS: 0.1 10*3/uL (ref 0–0.7)
Eosinophils Relative: 1 %
HCT: 30.2 % — ABNORMAL LOW (ref 40.0–52.0)
HEMOGLOBIN: 9.9 g/dL — AB (ref 13.0–18.0)
LYMPHS ABS: 1.3 10*3/uL (ref 1.0–3.6)
Lymphocytes Relative: 20 %
MCH: 30.9 pg (ref 26.0–34.0)
MCHC: 32.8 g/dL (ref 32.0–36.0)
MCV: 94.1 fL (ref 80.0–100.0)
MONOS PCT: 10 %
Monocytes Absolute: 0.6 10*3/uL (ref 0.2–1.0)
NEUTROS ABS: 4.4 10*3/uL (ref 1.4–6.5)
NEUTROS PCT: 68 %
Platelets: 197 10*3/uL (ref 150–440)
RBC: 3.21 MIL/uL — ABNORMAL LOW (ref 4.40–5.90)
RDW: 17.9 % — ABNORMAL HIGH (ref 11.5–14.5)
WBC: 6.5 10*3/uL (ref 3.8–10.6)

## 2017-01-12 LAB — TROPONIN I: TROPONIN I: 0.1 ng/mL — AB (ref <0.03)

## 2017-01-12 LAB — IRON AND TIBC
IRON: 27 ug/dL — AB (ref 45–182)
Saturation Ratios: 10 % — ABNORMAL LOW (ref 17.9–39.5)
TIBC: 264 ug/dL (ref 250–450)
UIBC: 237 ug/dL

## 2017-01-12 LAB — URINE DRUG SCREEN, QUALITATIVE (ARMC ONLY)
AMPHETAMINES, UR SCREEN: NOT DETECTED
BARBITURATES, UR SCREEN: NOT DETECTED
BENZODIAZEPINE, UR SCRN: NOT DETECTED
Cannabinoid 50 Ng, Ur ~~LOC~~: POSITIVE — AB
Cocaine Metabolite,Ur ~~LOC~~: NOT DETECTED
MDMA (Ecstasy)Ur Screen: NOT DETECTED
METHADONE SCREEN, URINE: NOT DETECTED
OPIATE, UR SCREEN: NOT DETECTED
Phencyclidine (PCP) Ur S: NOT DETECTED
TRICYCLIC, UR SCREEN: NOT DETECTED

## 2017-01-12 LAB — BASIC METABOLIC PANEL
ANION GAP: 12 (ref 5–15)
BUN: 86 mg/dL — ABNORMAL HIGH (ref 6–20)
CHLORIDE: 108 mmol/L (ref 101–111)
CO2: 19 mmol/L — ABNORMAL LOW (ref 22–32)
CREATININE: 10.48 mg/dL — AB (ref 0.61–1.24)
Calcium: 7.9 mg/dL — ABNORMAL LOW (ref 8.9–10.3)
GFR calc Af Amer: 6 mL/min — ABNORMAL LOW (ref >60)
GFR calc non Af Amer: 5 mL/min — ABNORMAL LOW (ref >60)
Glucose, Bld: 118 mg/dL — ABNORMAL HIGH (ref 65–99)
Potassium: 3.7 mmol/L (ref 3.5–5.1)
SODIUM: 139 mmol/L (ref 135–145)

## 2017-01-12 LAB — LIPASE, BLOOD: LIPASE: 36 U/L (ref 11–51)

## 2017-01-12 LAB — MAGNESIUM: MAGNESIUM: 2 mg/dL (ref 1.7–2.4)

## 2017-01-12 LAB — FERRITIN: Ferritin: 64 ng/mL (ref 24–336)

## 2017-01-12 LAB — MRSA PCR SCREENING: MRSA by PCR: NEGATIVE

## 2017-01-12 MED ORDER — FENTANYL CITRATE (PF) 100 MCG/2ML IJ SOLN
12.5000 ug | INTRAMUSCULAR | Status: AC
  Administered 2017-01-12: 12.5 ug via INTRAVENOUS

## 2017-01-12 MED ORDER — ACETAMINOPHEN 650 MG RE SUPP: 650.0000 mg | Freq: Four times a day (QID) | RECTAL | Status: DC | PRN

## 2017-01-12 MED ORDER — INFLUENZA VAC SPLIT QUAD 0.5 ML IM SUSY
0.5000 mL | PREFILLED_SYRINGE | INTRAMUSCULAR | Status: DC
  Filled 2017-01-12 (×2): qty 0.5

## 2017-01-12 MED ORDER — ASPIRIN EC 81 MG PO TBEC
81.0000 mg | DELAYED_RELEASE_TABLET | Freq: Every day | ORAL | Status: DC
  Administered 2017-01-12 – 2017-01-16 (×5): 81 mg via ORAL
  Filled 2017-01-12 (×5): qty 1

## 2017-01-12 MED ORDER — CLOPIDOGREL BISULFATE 75 MG PO TABS
75.0000 mg | ORAL_TABLET | Freq: Every day | ORAL | Status: DC
  Administered 2017-01-12 – 2017-01-16 (×5): 75 mg via ORAL
  Filled 2017-01-12 (×5): qty 1

## 2017-01-12 MED ORDER — FLUTICASONE PROPIONATE 50 MCG/ACT NA SUSP
1.0000 | Freq: Every day | NASAL | Status: DC
  Filled 2017-01-12: qty 16

## 2017-01-12 MED ORDER — HYDRALAZINE HCL 20 MG/ML IJ SOLN
10.0000 mg | Freq: Once | INTRAMUSCULAR | Status: AC
  Administered 2017-01-12: 10 mg via INTRAVENOUS
  Filled 2017-01-12: qty 1

## 2017-01-12 MED ORDER — NICARDIPINE HCL IN NACL 20-0.86 MG/200ML-% IV SOLN
3.0000 mg/h | INTRAVENOUS | Status: DC
  Administered 2017-01-12 (×3): 5 mg/h via INTRAVENOUS
  Filled 2017-01-12 (×4): qty 200

## 2017-01-12 MED ORDER — ADULT MULTIVITAMIN W/MINERALS CH
1.0000 | ORAL_TABLET | Freq: Every day | ORAL | Status: DC
  Administered 2017-01-12 – 2017-01-13 (×2): 1 via ORAL
  Filled 2017-01-12 (×2): qty 1

## 2017-01-12 MED ORDER — HYDRALAZINE HCL 20 MG/ML IJ SOLN
5.0000 mg | INTRAMUSCULAR | Status: DC | PRN
  Administered 2017-01-13: 10 mg via INTRAVENOUS
  Filled 2017-01-12: qty 1

## 2017-01-12 MED ORDER — FENTANYL CITRATE (PF) 100 MCG/2ML IJ SOLN
INTRAMUSCULAR | Status: AC
  Filled 2017-01-12: qty 2

## 2017-01-12 MED ORDER — HEPARIN 1000 UNIT/ML FOR PERITONEAL DIALYSIS
1000.0000 [IU] | INTRAMUSCULAR | Status: DC | PRN
  Administered 2017-01-12: 1000 [IU] via INTRAVENOUS
  Filled 2017-01-12 (×2): qty 1

## 2017-01-12 MED ORDER — ATORVASTATIN CALCIUM 20 MG PO TABS
40.0000 mg | ORAL_TABLET | Freq: Every day | ORAL | Status: DC
  Administered 2017-01-12 – 2017-01-16 (×5): 40 mg via ORAL
  Filled 2017-01-12 (×5): qty 2

## 2017-01-12 MED ORDER — ONE-DAILY MULTI VITAMINS PO TABS: 1.0000 | ORAL_TABLET | Freq: Every day | ORAL | Status: DC

## 2017-01-12 MED ORDER — FUROSEMIDE 10 MG/ML IJ SOLN
80.0000 mg | Freq: Every day | INTRAMUSCULAR | Status: DC
  Administered 2017-01-13: 80 mg via INTRAVENOUS
  Filled 2017-01-12: qty 8

## 2017-01-12 MED ORDER — ASPIRIN 81 MG PO TBEC: 81.0000 mg | DELAYED_RELEASE_TABLET | Freq: Every day | ORAL | Status: DC

## 2017-01-12 MED ORDER — FUROSEMIDE 10 MG/ML IJ SOLN
80.0000 mg | Freq: Once | INTRAMUSCULAR | Status: AC
  Administered 2017-01-12: 80 mg via INTRAVENOUS
  Filled 2017-01-12: qty 8

## 2017-01-12 MED ORDER — TUBERCULIN PPD 5 UNIT/0.1ML ID SOLN
5.0000 [IU] | Freq: Once | INTRADERMAL | Status: AC
  Administered 2017-01-12: 5 [IU] via INTRADERMAL
  Filled 2017-01-12: qty 0.1

## 2017-01-12 MED ORDER — HEPARIN SODIUM (PORCINE) 5000 UNIT/ML IJ SOLN
5000.0000 [IU] | Freq: Three times a day (TID) | INTRAMUSCULAR | Status: DC
  Administered 2017-01-12 – 2017-01-16 (×10): 5000 [IU] via SUBCUTANEOUS
  Filled 2017-01-12 (×10): qty 1

## 2017-01-12 MED ORDER — PNEUMOCOCCAL VAC POLYVALENT 25 MCG/0.5ML IJ INJ
0.5000 mL | INJECTION | INTRAMUSCULAR | Status: DC
  Filled 2017-01-12 (×2): qty 0.5

## 2017-01-12 MED ORDER — ACETAMINOPHEN 325 MG PO TABS
650.0000 mg | ORAL_TABLET | Freq: Four times a day (QID) | ORAL | Status: DC | PRN
  Administered 2017-01-13: 650 mg via ORAL
  Filled 2017-01-12: qty 2

## 2017-01-12 MED ORDER — NITROGLYCERIN 0.4 MG SL SUBL
0.4000 mg | SUBLINGUAL_TABLET | SUBLINGUAL | Status: DC | PRN
  Administered 2017-01-14 (×3): 0.4 mg via SUBLINGUAL
  Filled 2017-01-12: qty 1

## 2017-01-12 NOTE — H&P (Signed)
Fernando Salas at [place] NAME: Fernando Salas    MR#:  [id number]  DATE OF BIRTH:  [birth year]  DATE OF ADMISSION:  01/12/2017  PRIMARY CARE PHYSICIAN: [name], MD   REQUESTING/REFERRING PHYSICIAN: Dr Burlene Arnt  CHIEF COMPLAINT:   Chief Complaint  Patient presents with  . Emesis  . Diarrhea  . Flank Pain  . Shortness of Breath    HISTORY OF PRESENT ILLNESS:  Fernando Salas  is a 46 y.o. male with a known history of CAD, chronic kidney disease, hypertension and hyperlipidemia.  He presents with shortness of breath, nausea, vomiting, abdominal pain and diarrhea.  Patient also has increased swelling of his lower extremities.  Patient states he is lost about 60 pounds in the past year.  He has a funny taste in his mouth.  He is fatigued.  Last week he said he had nausea vomiting and diarrhea 7 times.  PAST MEDICAL HISTORY:   Past Medical History:  Diagnosis Date  . CAD (coronary artery disease)    status post multiple myocardial infarctions  . Chronic kidney disease   . COPD (chronic obstructive pulmonary disease) ([place])   . Hyperlipidemia   . Hypertension   . Myocardial infarction ([place])    " I had 13 heart attacks, 8 massive"  . Proteinuria   . Renal insufficiency   . Secondary hyperparathyroidism of renal origin ([place])   . Shortness of breath dyspnea    " when I drink a cup of cold water too fast"  . Sleep apnea    pt stated " long story" does not wear CPAP  . Stroke [facility])     PAST SURGICAL HISTORY:   Past Surgical History:  Procedure Laterality Date  . AV FISTULA PLACEMENT Left 01-24-14   By Dr. Hortencia Pilar in [place]  . CARDIAC CATHETERIZATION    . CORONARY STENT PLACEMENT     13 stents total    SOCIAL HISTORY:   Social History   Tobacco Use  . Smoking status: Current Every Day Smoker    Packs/day: 0.50    Years: 25.00    Pack years: 12.50    Types: Cigarettes  . Smokeless tobacco: Never Used   Substance Use Topics  . Alcohol use: No    Alcohol/week: 0.0 oz    Frequency: Never    Comment: occasional    FAMILY HISTORY:   Family History  Problem Relation Age of Onset  . Heart disease Mother   . Hyperlipidemia Mother   . Hypertension Mother   . Kidney disease Father   . Kidney disease Brother   . Hyperlipidemia Sister   . Hyperlipidemia Daughter   . Hypertension Sister   . Hypertension Daughter     DRUG ALLERGIES:   Allergies  Allergen Reactions  . Ibuprofen Other (See Comments)    Kidney   . Viagra [Sildenafil]     Lips swell   . Clonidine Derivatives Rash    REVIEW OF SYSTEMS:  CONSTITUTIONAL: Positive for fever, chills and sweats and fatigue.  60 pound weight loss in the last year EYES: Positive for blurred vision.  EARS, NOSE, AND THROAT: No tinnitus or ear pain. No sore throat.  Positive for postnasal drip RESPIRATORY: Positive for cough, shortness of breath.  No wheezing or hemoptysis.  CARDIOVASCULAR: No chest pain.  Positive for edema.  GASTROINTESTINAL: Positive for nausea, vomiting, diarrhea and abdominal pain. No blood in bowel movements GENITOURINARY: Sometimes with difficulty urinating ENDOCRINE: No  polyuria, nocturia,  HEMATOLOGY: No anemia, easy bruising or bleeding SKIN: No rash or lesion. MUSCULOSKELETAL: No joint pain or arthritis.   NEUROLOGIC: No tingling, numbness, weakness.  PSYCHIATRY: No anxiety or depression.   MEDICATIONS AT HOME:   Prior to Admission medications   Medication Sig Start Date End Date Taking? Authorizing Provider  aspirin EC 81 MG EC tablet Take 1 tablet (81 mg total) by mouth daily. 11/03/16  Yes Demetrios Loll, MD  atorvastatin (LIPITOR) 40 MG tablet Take 1 tablet (40 mg total) by mouth daily. 11/03/16  Yes Demetrios Loll, MD  clopidogrel (PLAVIX) 75 MG tablet Take 1 tablet (75 mg total) by mouth daily. 11/03/16  Yes Demetrios Loll, MD  fluticasone [facility]) 50 MCG/ACT nasal spray INSTILL 1 SPRAY PER NARES DAILY 01/14/16  Yes  [provider]  isosorbide-hydrALAZINE (BIDIL) 20-37.5 MG tablet Take 1 tablet by mouth 3 (three) times daily. 11/02/16  Yes Demetrios Loll, MD  Multiple Vitamin (MULTIVITAMIN) tablet Take 1 tablet daily by mouth.   Yes [provider]  nitroGLYCERIN (NITROSTAT) 0.4 MG SL tablet Place 0.4 mg under the tongue every 5 (five) minutes as needed for chest pain.   Yes [provider]  amLODipine (NORVASC) 10 MG tablet Take 10 mg by mouth daily.    [provider]  carvedilol (COREG) 25 MG tablet Take 25 mg by mouth 2 (two) times daily. 02/19/14   [provider]  furosemide (LASIX) 80 MG tablet Take 1 tablet (80 mg total) by mouth daily. Patient not taking: Reported on 01/12/2017 11/03/16   Demetrios Loll, MD   Patient ran out of his blood pressure medications and could not get a refill  VITAL SIGNS:  Blood pressure (!) 223/143, pulse 78, temperature 98.9 F (37.2 C), temperature source Oral, resp. rate (!) 24, height 6' (1.829 m), weight 90.7 kg (200 lb), SpO2 100 %.  PHYSICAL EXAMINATION:  GENERAL:  46 y.o.-year-old patient lying in the bed with no acute distress.  EYES: Pupils equal, round, reactive to light and accommodation. No scleral icterus. Extraocular muscles intact.  HEENT: Head atraumatic, normocephalic. Oropharynx and nasopharynx clear.  NECK:  Supple, no jugular venous distention. No thyroid enlargement, no tenderness.  LUNGS: Decreased breath sounds bilaterally, no wheezing, positive for rales lower airways.  No rhonchi or crepitation. No use of accessory muscles of respiration.  CARDIOVASCULAR: S1, S2 normal. No murmurs, rubs, or gallops.  ABDOMEN: Soft, nontender, nondistended. Bowel sounds present. No organomegaly or mass.  EXTREMITIES: 2+ edema, no cyanosis, or clubbing.  NEUROLOGIC: Cranial nerves II through XII are intact. Muscle strength 5/5 in all extremities. Sensation intact. Gait not checked.  PSYCHIATRIC: The patient is alert and  oriented x 3.  SKIN: No rash, lesion, or ulcer.   LABORATORY PANEL:   CBC Recent Labs  Lab 01/12/17 0706  WBC 6.5  HGB 9.9*  HCT 30.2*  PLT 197   ------------------------------------------------------------------------------------------------------------------  Chemistries  Recent Labs  Lab 01/12/17 0706  NA 138  K 5.1  CL 111  CO2 15*  GLUCOSE 112*  BUN 117*  CREATININE 13.52*  CALCIUM 8.5*  AST 23  ALT 26  ALKPHOS 66  BILITOT 1.0   ------------------------------------------------------------------------------------------------------------------  Cardiac Enzymes Recent Labs  Lab 01/12/17 0706  TROPONINI 0.10*   ------------------------------------------------------------------------------------------------------------------  RADIOLOGY:  Dg Chest 2 View  Result Date: 01/12/2017 CLINICAL DATA:  Shortness of breath and productive cough for 1 week EXAM: CHEST  2 VIEW COMPARISON:  10/31/2016 FINDINGS: Cardiac shadow is enlarged but stable. The  lungs are well aerated bilaterally. Increased central vascular congestion is noted as well as some patchy atelectatic changes. Minimal blunting of the costophrenic angles is noted. No acute bony abnormality is seen. IMPRESSION: Increased vascular congestion with bilateral patchy atelectatic changes. Electronically Signed   By: Inez Catalina M.D.   On: 01/12/2017 07:48   Ct Renal Stone Study  Result Date: 01/12/2017 CLINICAL DATA:  46 year old male on dialysis presenting with nausea, vomiting and diarrhea for the past week accompanying bite right flank pain. Shortness breath and cough. Initial encounter. EXAM: CT ABDOMEN AND PELVIS WITHOUT CONTRAST TECHNIQUE: Multidetector CT imaging of the abdomen and pelvis was performed following the standard protocol without IV contrast. COMPARISON:  12/10/2014 CT.  01/12/2017 chest x-ray. FINDINGS: Lower chest: Bilateral pleural effusions greater right with fluid extending into the fissures  suggestive of pulmonary edema. Cardiomegaly. Trace right coronary artery calcifications. Hepatobiliary: Taking into account limitation by non contrast imaging, no hepatic mass. Small gallstones. Limited for evaluating for gallbladder inflammation given third spacing of fluid. Pancreas: Questionable 9 mm hypodensity within the posterior aspect of the the body of the pancreas (series 2, images 25 and 26). Spleen: Taking into account limitation by non contrast imaging, no splenic lesion or enlargement. Adrenals/Urinary Tract: No obstructing stone or hydronephrosis. Scattered low-density structures largest 8 mm on the right consistent with cyst. Some of the small lesions too small to characterize although statistically likely cysts. No adrenal mass. Noncontrast filled views of the urinary bladder without mass identified. Stomach/Bowel: Evaluation of bowel is limited by lack of contrast, fat planes and third spacing of fluid. The appendix is gas filled without surrounding inflammation. Vascular/Lymphatic: Atherosclerotic changes aorta with ectasia without focal aneurysm. Ectatic aortic branch vessels including celiac and superior mesenteric artery. Dilated common iliac arteries measuring up to 1.7 cm bilaterally. Scattered normal/ top-normal size lymph nodes possibly reactive in origin. Reproductive: No gross abnormality Other: No free intraperitoneal air.  Third spacing of fluid. Musculoskeletal: Bony overgrowth left pubic region may be related to remote trauma. No acute osseous abnormality. IMPRESSION: Cardiomegaly with bilateral pleural effusion and fluid within fissures suggestive of pulmonary edema. No obstructing stone or hydronephrosis. Evaluation of bowel is limited by lack of contrast, fat planes and third spacing of fluid. The appendix is gas filled without surrounding inflammation. Small gallstones. Limited for evaluating for gallbladder inflammation given third spacing of fluid. Questionable 9 mm hypodensity  within the posterior aspect of the the body of the pancreas (series 2, images 25 and 26). Aortic Atherosclerosis (ICD10-I70.0). Ectatic aortic branch vessels including celiac and superior mesenteric artery. Dilated common iliac arteries measuring up to 1.7 cm bilaterally. Scattered normal/ top-normal size lymph nodes possibly reactive in origin. Electronically Signed   By: Genia Del M.D.   On: 01/12/2017 08:17    EKG:   Sinus rhythm 81 bpm, left atrial enlargement, RSR prime, intraventricular conduction delay  IMPRESSION AND PLAN:   1.  Accelerated hypertension.  We will start Cardene drip and admit to the CCU stepdown.  Spoke with the pharmacist Scott in the CCU about starting Cardene drip.  Case discussed with Dr. Alva Garnet critical care specialist.  Systolic blood pressure goal 160 today.  Patient's blood pressure likely elevated for a while because he has been out of his medications 2.  Uremia with worsening kidney disease, nausea vomiting and diarrhea.  GFR 4.  Case discussed with Dr. Candiss Norse nephrology to initiate dialysis today.  Dr. Jamal Collin critical care specialist will place catheter for dialysis today.  As needed nausea medications. 3.  Acute congestive heart failure.  Echocardiogram for further evaluation of ejection fraction.  1 dose of Lasix given in the ER.  Likely will need Lasix and dialysis to help out with fluid management. 4.  History of coronary artery disease on aspirin Plavix and Lipitor 5.  Tobacco abuse.  Smoking cessation counseling done by me for 4 minutes.  Refused nicotine patch 6.  Marijuana abuse.  Advised to cut this back also.  Send off urine toxicology  All the records are reviewed and case discussed with ED provider. Management plans discussed with the patient, family and they are in agreement.  CODE STATUS: Full code  TOTAL TIME TAKING CARE OF THIS PATIENT: 50 minutes, patient will be admitted to the critical care unit stepdown.    Loletha Grayer M.D on  01/12/2017 at 10:01 AM  Between 7am to 6pm - Pager - [phone]  After 6pm call admission pager [phone]  [facility]  [phone]  CC: Primary care physician; [name], MD

## 2017-01-12 NOTE — Progress Notes (Signed)
HD initiated via R Fem cath without issue. No heparin treatment. 0.5L uf as ordered. Continue to monitor closely

## 2017-01-12 NOTE — Progress Notes (Signed)
Post HD assessment unchanged  

## 2017-01-12 NOTE — ED Notes (Signed)
Date and time results received: 01/12/17 0845 (use smartphrase ".now" to insert current time)  Test: troponin Critical Value: 0.10  Name of Provider Notified: Burlene Arnt

## 2017-01-12 NOTE — Progress Notes (Signed)
Pre hd 

## 2017-01-12 NOTE — ED Provider Notes (Addendum)
[facility] Emergency Department Provider Note  ____________________________________________   I have reviewed the triage vital signs and the nursing notes.   HISTORY  Chief Complaint Emesis; Diarrhea; Flank Pain; and Shortness of Breath    HPI Fernando Salas is a 46 y.o. male with multiple medical problems including CAD CKD, has had 2 fistulas placed but never been placed on dialysis and the fistula is "do not work".  States he would rather die than go on dialysis because it is God's plan.  No SI or HI.  History of sleep apnea but does not use BiPAP history of hypertension but has not been on his blood pressure medications for several months.  History of CAD, hypertension, complains of many different things today.  He states that he has had nausea vomiting diarrhea for a week.  He states that he has had no bloody emesis or melena or bright red blood per rectum.  He has had no chest pain or headache but he has a pain in his "right kidney" which sometimes is in his left kidney.  He still makes urine "a little bit" he states that he has had a cough which is productive, and rhinorrhea as well.  Subjective fevers at home.  He may call the symptoms better nothing makes them worse, it has been going on for about a week.  He has been out of his blood pressure medications for several weeks or possibly months and he has not taken any of his other medications for the last couple days.  He has not recently had his blood pressure checked but he knows it "runs high".  He also states that his legs "were swollen" but now they are better.  He has a metallic taste in his mouth.  Past Medical History:  Diagnosis Date  . CAD (coronary artery disease)    status post multiple myocardial infarctions  . Chronic kidney disease   . COPD (chronic obstructive pulmonary disease) ([place])   . Hyperlipidemia   . Hypertension   . Myocardial infarction ([place])    " I had 13 heart attacks, 8 massive"  .  Proteinuria   . Renal insufficiency   . Secondary hyperparathyroidism of renal origin ([place])   . Shortness of breath dyspnea    " when I drink a cup of cold water too fast"  . Sleep apnea    pt stated " long story" does not wear CPAP  . Stroke [facility])     Patient Active Problem List   Diagnosis Date Noted  . Unstable angina ([place]) 11/01/2016    Past Surgical History:  Procedure Laterality Date  . AV FISTULA PLACEMENT Left 01-24-14   By Dr. Hortencia Pilar in [place]  . CARDIAC CATHETERIZATION    . CORONARY STENT PLACEMENT     13 stents total    Prior to Admission medications   Medication Sig Start Date End Date Taking? Authorizing Provider  amLODipine (NORVASC) 10 MG tablet Take 10 mg by mouth daily.    [provider]  aspirin EC 81 MG EC tablet Take 1 tablet (81 mg total) by mouth daily. 11/03/16   Demetrios Loll, MD  atorvastatin (LIPITOR) 40 MG tablet Take 1 tablet (40 mg total) by mouth daily. 11/03/16   Demetrios Loll, MD  carvedilol (COREG) 25 MG tablet Take 25 mg by mouth 2 (two) times daily. 02/19/14   [provider]  clopidogrel (PLAVIX) 75 MG tablet Take 1 tablet (75 mg total) by mouth daily.  11/03/16   Demetrios Loll, MD  [employer] NON-ASPIRIN EXTRA STRENGTH 500 MG tablet Take 1,000 mg by mouth every 6 (six) hours as needed. for pain 01/14/16   [provider]  fluticasone (FLONASE) 50 MCG/ACT nasal spray INSTILL 1 SPRAY PER NARES DAILY 01/14/16   [provider]  furosemide (LASIX) 80 MG tablet Take 1 tablet (80 mg total) by mouth daily. 11/03/16   Demetrios Loll, MD  isosorbide-hydrALAZINE (BIDIL) 20-37.5 MG tablet Take 1 tablet by mouth 3 (three) times daily. 11/02/16   Demetrios Loll, MD  nitroGLYCERIN (NITROSTAT) 0.4 MG SL tablet Place 0.4 mg under the tongue every 5 (five) minutes as needed for chest pain.    [provider]    Allergies Ibuprofen; Viagra [sildenafil]; and Clonidine derivatives  Family History  Problem Relation Age of Onset  .  Heart disease Mother   . Hyperlipidemia Mother   . Hypertension Mother   . Kidney disease Father   . Kidney disease Brother   . Hyperlipidemia Sister   . Hyperlipidemia Daughter   . Hypertension Sister   . Hypertension Daughter     Social History Social History   Tobacco Use  . Smoking status: Current Every Day Smoker    Packs/day: 0.50    Years: 25.00    Pack years: 12.50    Types: Cigarettes  . Smokeless tobacco: Never Used  Substance Use Topics  . Alcohol use: Yes    Alcohol/week: 0.0 oz    Comment: occasional  . Drug use: Yes    Types: Marijuana    Comment: daily    Review of Systems Constitutional: No fever/chills Eyes: No visual changes. ENT: No sore throat. No stiff neck no neck pain Cardiovascular: Denies chest pain. Respiratory: Denies shortness of breath.  Positive cough Gastrointestinal: See HPI Genitourinary: Negative for dysuria. Musculoskeletal: Positive lower extremity swelling Skin: Negative for rash. Neurological: Negative for severe headaches, focal weakness or numbness.   ____________________________________________   PHYSICAL EXAM:  VITAL SIGNS: ED Triage Vitals  Enc Vitals Group     BP 01/12/17 0653 (!) 220/134     Pulse Rate 01/12/17 0653 80     Resp 01/12/17 0653 19     Temp 01/12/17 0653 98.9 F (37.2 C)     Temp Source 01/12/17 0653 Oral     SpO2 01/12/17 0653 98 %     Weight 01/12/17 0645 200 lb (90.7 kg)     Height 01/12/17 0645 6' (1.829 m)     Head Circumference --      Peak Flow --      Pain Score 01/12/17 0653 1     Pain Loc --      Pain Edu? --      Excl. in [place]? --     Constitutional: Alert and oriented.  Patient is markedly better than one would suspect given his history is in complaint but he appears to be chronically ill person.  He is not in any acute distress at this moment Eyes: Conjunctivae are normal Head: Atraumatic HEENT: Positive mild clear congestion/rhinnorhea. Mucous membranes are dry.  Oropharynx  non-erythematous Neck:   Nontender with no meningismus, no masses, no stridor Cardiovascular: Normal rate, regular rhythm. Grossly normal heart sounds.  Good peripheral circulation. Respiratory: Normal respiratory effort.  No retractions. Lungs somewhat diminished in the bases but otherwise clear with no wheezing rales or rhonchi appreciated on initial exam Abdominal: Soft and nontender. No distention. No guarding no rebound Back:  There is no focal  tenderness or step off.  there is no midline tenderness there are no lesions noted. there is positive right CVA tenderness Musculoskeletal: No lower extremity tenderness, no upper extremity tenderness. No joint effusions, no DVT signs strong distal pulses no edema Neurologic:  Normal speech and language. No gross focal neurologic deficits are appreciated.  Skin:  Skin is warm, dry and intact. No rash noted. Psychiatric: Mood and affect are normal. Speech and behavior are normal.  ____________________________________________   LABS (all labs ordered are listed, but only abnormal results are displayed)  Labs Reviewed  CBC WITH DIFFERENTIAL/PLATELET - Abnormal; Notable for the following components:      Result Value   RBC 3.21 (*)    Hemoglobin 9.9 (*)    HCT 30.2 (*)    RDW 17.9 (*)    All other components within normal limits  COMPREHENSIVE METABOLIC PANEL  LIPASE, BLOOD  TROPONIN I  URINALYSIS, COMPLETE (UACMP) WITH MICROSCOPIC    Pertinent labs  results that were available during my care of the patient were reviewed by me and considered in my medical decision making (see chart for details). ____________________________________________  EKG  I personally interpreted any EKGs ordered by me or triage Sinus rhythm rate 81 bpm normal axis no acute ST elevation or depression nonspecific ST changes ____________________________________________  RADIOLOGY  Pertinent labs & imaging results that were available during my care of the patient  were reviewed by me and considered in my medical decision making (see chart for details). If possible, patient and/or family made aware of any abnormal findings. ____________________________________________    PROCEDURES  Procedure(s) performed: None  Procedures  Critical Care performed: CRITICAL CARE Performed by: Schuyler Amor   Total critical care time: 45 minutes  Critical care time was exclusive of separately billable procedures and treating other patients.  Critical care was necessary to treat or prevent imminent or life-threatening deterioration.  Critical care was time spent personally by me on the following activities: development of treatment plan with patient and/or surrogate as well as nursing, discussions with consultants, evaluation of patient's response to treatment, examination of patient, obtaining history from patient or surrogate, ordering and performing treatments and interventions, ordering and review of laboratory studies, ordering and review of radiographic studies, pulse oximetry and re-evaluation of patient's condition.   ____________________________________________   INITIAL IMPRESSION / ASSESSMENT AND PLAN / ED COURSE  Pertinent labs & imaging results that were available during my care of the patient were reviewed by me and considered in my medical decision making (see chart for details).  Patient here with a host of complaints, nausea vomiting diarrhea, pain in his flank, noncompliance with his medications, runny nose and cough, occasional leg swelling.  Patient's blood pressure is quite elevated but of course we do not know what his baseline is and I suspect it runs very high as he has been off of all of his blood pressure medications for months probably.  Taking his problems in order, for nausea vomiting and diarrhea, patient does appear somewhat dehydrated but I would hold off on aggressive fluid hydration until he get a chest x-ray as patient is not  agreeing to the possibility of dialysis, and I do not wish to flood him.  Will obtain chest x-ray therefore.  Also this will help evaluate his cough and rhinorrhea.  Sats are 98% and I do not auscultate a pneumonia fortunately.  Patient has flank pain and while his abdomen is benign, I will obtain a noncontrasted CT to evaluate  for other intra-abdominal pathology given his 1 week of vomiting and diarrhea.  Will obtain a urinalysis to see if there is evidence of UTI although of course patient produces very small amounts of urine.  He and I did discuss whether, if dialysis is indicated, he would accept that he is adamant that he will not.  This is consistent with prior visits to the hospital.  He is also refused heart catheter in the past.  Because he is short of breath and given his blood pressure will check a troponin even though his EKG is reassuring at least getting a baseline.  Patient does not seem to have any acute symptoms with this very high blood pressure and I suspect is near his baseline therefore I will not very aggressively lower it until I have a few more readings and a better sense of what were dealing with.  ----------------------------------------- 9:13 AM on 01/12/2017 -----------------------------------------  Patient and significant renal failure with preserved potassium fortunately.  However very high BUN.  This likely is causing some of his symptoms from hyper uremia.  After discussing with Dr. Candiss Norse, we are giving the patient IV Lasix and hydralazine.  Most likely will not be able to acutely lower his pressure given his significant renal failure but we will do our best to bring it down gradually.  Target blood pressure is certainly not normal blood pressure.  He is asymptomatic at this time.  Troponin is positive.  Patient will need admission for further management of these and further discussion of whether he wishes to go on dialysis because if he does not go on dialysis, he will likely  die.  This is been explicitly explained to the patient and he does understand it.  He is considering his options patient does state his baseline blood pressure is 210/110.    ____________________________________________   FINAL CLINICAL IMPRESSION(S) / ED DIAGNOSES  Final diagnoses:  None      This chart was dictated using voice recognition software.  Despite best efforts to proofread,  errors can occur which can change meaning.      Schuyler Amor, MD 01/12/17 [id number]    Schuyler Amor, MD 01/12/17 0630    Schuyler Amor, MD 01/12/17 1601    Schuyler Amor, MD 01/12/17 [phone]

## 2017-01-12 NOTE — Progress Notes (Signed)
Dialysis completed without issue. 0.5L UF as ordered. Patient currently without complaints. Report given to primary RN

## 2017-01-12 NOTE — ED Notes (Signed)
Patient transported to X-ray 

## 2017-01-12 NOTE — Progress Notes (Signed)
Harless Nakayama, NP gave order for 12.5-25 mcg of fentanyl IV to be given prior to and intra procedure during dialysis catheter placement.

## 2017-01-12 NOTE — Consult Note (Signed)
PULMONARY / CRITICAL CARE MEDICINE   Name: Fernando Salas MRN: [id number] DOB: Aug 12, 1970    ADMISSION DATE:  01/12/2017   CONSULTATION DATE: January 12, 2017  REFERRING MD: Dr. Leslye Peer  Reason: Hypertensive emergency and need for emergent dialysis  CHIEF COMPLAINT: Nausea vomiting diarrhea and dyspnea  SIGNIFICANT EVENTS: January 12, 2017: Admitted; emergent dialysis  HISTORY OF PRESENT ILLNESS:   This is a 46 year old African-American male with a past medical history as indicated below, end-stage renal disease on hemodialysis, nonadherent to treatment, presenting with nausea vomiting diarrhea and dyspnea times 1 week.  Symptoms got worse today and hence patient decided to come to the emergency room.  He also reports an associated productive cough with brown sputum and a metallic taste in his mouth.  At the ED, patient was found to be in hypertensive crisis with a blood pressure of 220/134.  His chest x-ray shows pulmonary edema and his labs showed a creatinine of 13.5.  He is being admitted to the ICU for emergent dialysis.   Patient is awake, and requesting something to eat.  He denies chest pain, palpitations, but reports significant improvement in nausea and vomiting. PAST MEDICAL HISTORY :  He  has a past medical history of CAD (coronary artery disease), Chronic kidney disease, COPD (chronic obstructive pulmonary disease) ([place]), Hyperlipidemia, Hypertension, Myocardial infarction ([place]), Proteinuria, Renal insufficiency, Secondary hyperparathyroidism of renal origin ([place]), Shortness of breath dyspnea, Sleep apnea, and Stroke ([place]).  PAST SURGICAL HISTORY: He  has a past surgical history that includes AV fistula placement (Left, 01-24-14); Coronary stent placement; Cardiac catheterization; Left INSERTION OF ARTERIOVENOUS (AV) GORE-TEX GRAFT ARM (Left, 05/31/2014); and RIGHT BRACHIOCEPHALIC ARTERIOVENOUS (AV) FISTULA CREATION (Right, 04/30/2014).  Allergies  Allergen Reactions  .  Ibuprofen Other (See Comments)    Kidney   . Viagra [Sildenafil]     Lips swell   . Clonidine Derivatives Rash    No current facility-administered medications on file prior to encounter.    Current Outpatient Medications on File Prior to Encounter  Medication Sig  . aspirin EC 81 MG EC tablet Take 1 tablet (81 mg total) by mouth daily.  Marland Kitchen atorvastatin (LIPITOR) 40 MG tablet Take 1 tablet (40 mg total) by mouth daily.  . clopidogrel (PLAVIX) 75 MG tablet Take 1 tablet (75 mg total) by mouth daily.  . fluticasone (FLONASE) 50 MCG/ACT nasal spray INSTILL 1 SPRAY PER NARES DAILY  . isosorbide-hydrALAZINE (BIDIL) 20-37.5 MG tablet Take 1 tablet by mouth 3 (three) times daily.  . Multiple Vitamin (MULTIVITAMIN) tablet Take 1 tablet daily by mouth.  . nitroGLYCERIN (NITROSTAT) 0.4 MG SL tablet Place 0.4 mg under the tongue every 5 (five) minutes as needed for chest pain.  Marland Kitchen amLODipine (NORVASC) 10 MG tablet Take 10 mg by mouth daily.  . carvedilol (COREG) 25 MG tablet Take 25 mg by mouth 2 (two) times daily.  . furosemide (LASIX) 80 MG tablet Take 1 tablet (80 mg total) by mouth daily. (Patient not taking: Reported on 01/12/2017)    FAMILY HISTORY:  His indicated that his mother is alive. He indicated that his father is alive. He indicated that the status of his brother is unknown.   SOCIAL HISTORY: He  reports that he has been smoking cigarettes.  He has a 12.50 pack-year smoking history. he has never used smokeless tobacco. He reports that he uses drugs. Drug: Marijuana. He reports that he does not drink alcohol.  REVIEW OF SYSTEMS:   All systems reviewed.  Pertinent positives  include generalized weakness, nausea, vomiting, and a metallic taste in the mouth.  All other systems are negative  SUBJECTIVE:   VITAL SIGNS: BP (!) 171/89   Pulse 83   Temp 97.9 F (36.6 C) (Oral)   Resp 20   Ht 6' (1.829 m)   Wt 196 lb 3.4 oz (89 kg)   SpO2 93%   BMI 26.61 kg/m   HEMODYNAMICS:     VENTILATOR SETTINGS:    INTAKE / OUTPUT: No intake/output data recorded.  PHYSICAL EXAMINATION: General: Appears chronically ill, in no distress Neuro: Alert and oriented x4, no focal deficits HEENT: Oral mucosa moist, PERRLA, neck is supple with good range of motion, mild JVD Cardiovascular: Apical pulse regular, S1-S2, no murmur regurg or gallop, +2 pulses, +2 edema bilaterally Lungs: Breath sounds diminished in the bases, no wheezing or rhonchi Abdomen: Nondistended, normal bowel sounds in all 4 quadrants Musculoskeletal: Positive range of motion, no deformities Skin: Warm and dry  LABS:  BMET Recent Labs  Lab 01/12/17 0706  NA 138  K 5.1  CL 111  CO2 15*  BUN 117*  CREATININE 13.52*  GLUCOSE 112*    Electrolytes Recent Labs  Lab 01/12/17 0706  CALCIUM 8.5*    CBC Recent Labs  Lab 01/12/17 0706  WBC 6.5  HGB 9.9*  HCT 30.2*  PLT 197    Coag's No results for input(s): APTT, INR in the last 168 hours.  Sepsis Markers No results for input(s): LATICACIDVEN, PROCALCITON, O2SATVEN in the last 168 hours.  ABG No results for input(s): PHART, PCO2ART, PO2ART in the last 168 hours.  Liver Enzymes Recent Labs  Lab 01/12/17 0706  AST 23  ALT 26  ALKPHOS 66  BILITOT 1.0  ALBUMIN 3.5    Cardiac Enzymes Recent Labs  Lab 01/12/17 0706  TROPONINI 0.10*    Glucose No results for input(s): GLUCAP in the last 168 hours.  Imaging Dg Chest 2 View  Result Date: 01/12/2017 CLINICAL DATA:  Shortness of breath and productive cough for 1 week EXAM: CHEST  2 VIEW COMPARISON:  10/31/2016 FINDINGS: Cardiac shadow is enlarged but stable. The lungs are well aerated bilaterally. Increased central vascular congestion is noted as well as some patchy atelectatic changes. Minimal blunting of the costophrenic angles is noted. No acute bony abnormality is seen. IMPRESSION: Increased vascular congestion with bilateral patchy atelectatic changes. Electronically Signed    By: Inez Catalina M.D.   On: 01/12/2017 07:48   Ct Renal Stone Study  Result Date: 01/12/2017 CLINICAL DATA:  46 year old male on dialysis presenting with nausea, vomiting and diarrhea for the past week accompanying bite right flank pain. Shortness breath and cough. Initial encounter. EXAM: CT ABDOMEN AND PELVIS WITHOUT CONTRAST TECHNIQUE: Multidetector CT imaging of the abdomen and pelvis was performed following the standard protocol without IV contrast. COMPARISON:  12/10/2014 CT.  01/12/2017 chest x-ray. FINDINGS: Lower chest: Bilateral pleural effusions greater right with fluid extending into the fissures suggestive of pulmonary edema. Cardiomegaly. Trace right coronary artery calcifications. Hepatobiliary: Taking into account limitation by non contrast imaging, no hepatic mass. Small gallstones. Limited for evaluating for gallbladder inflammation given third spacing of fluid. Pancreas: Questionable 9 mm hypodensity within the posterior aspect of the the body of the pancreas (series 2, images 25 and 26). Spleen: Taking into account limitation by non contrast imaging, no splenic lesion or enlargement. Adrenals/Urinary Tract: No obstructing stone or hydronephrosis. Scattered low-density structures largest 8 mm on the right consistent with cyst. Some of the small  lesions too small to characterize although statistically likely cysts. No adrenal mass. Noncontrast filled views of the urinary bladder without mass identified. Stomach/Bowel: Evaluation of bowel is limited by lack of contrast, fat planes and third spacing of fluid. The appendix is gas filled without surrounding inflammation. Vascular/Lymphatic: Atherosclerotic changes aorta with ectasia without focal aneurysm. Ectatic aortic branch vessels including celiac and superior mesenteric artery. Dilated common iliac arteries measuring up to 1.7 cm bilaterally. Scattered normal/ top-normal size lymph nodes possibly reactive in origin. Reproductive: No gross  abnormality Other: No free intraperitoneal air.  Third spacing of fluid. Musculoskeletal: Bony overgrowth left pubic region may be related to remote trauma. No acute osseous abnormality. IMPRESSION: Cardiomegaly with bilateral pleural effusion and fluid within fissures suggestive of pulmonary edema. No obstructing stone or hydronephrosis. Evaluation of bowel is limited by lack of contrast, fat planes and third spacing of fluid. The appendix is gas filled without surrounding inflammation. Small gallstones. Limited for evaluating for gallbladder inflammation given third spacing of fluid. Questionable 9 mm hypodensity within the posterior aspect of the the body of the pancreas (series 2, images 25 and 26). Aortic Atherosclerosis (ICD10-I70.0). Ectatic aortic branch vessels including celiac and superior mesenteric artery. Dilated common iliac arteries measuring up to 1.7 cm bilaterally. Scattered normal/ top-normal size lymph nodes possibly reactive in origin. Electronically Signed   By: Genia Del M.D.   On: 01/12/2017 08:17    STUDIES:  None  CULTURES: None  ANTIBIOTICS: None  LINES/TUBES: Right femoral double-lumen HD catheter Peripheral IVs  DISCUSSION: 46 year old male with a history of end-stage renal disease, nonadherent with treatment, resenting with hypertensive emergency acute pulmonary edema and need for emergent hemodialysis  ASSESSMENT  Acute pulmonary edema End-stage renal disease Hypertensive emergency Anemia of chronic disease History of obstructive sleep apnea-Noncompliant with CPAP History of coronary artery disease status post MI History of hyperlipidemia History of secondary hyperparathyroidism History of COPD  PLAN Hemodynamics per ICU protocol Supplemental oxygen as needed to maintain SPO2 greater than 90 Hemodialysis per nephrology Hydralazine and nicardipine for blood pressure control Blood pressure goal: Maintain systolic blood pressure between 140 and 170  mmHg IV diuresis Influenza vaccine Monitor hemoglobin and hematocrit and transfuse for hemoglobin less than 7 Rest of the treatment plan per primary team GI and DVT prophylaxis  FAMILY  - Updates: Patient updated on current treatment plan.  All questions answered.  Patient is a full code  - Inter-disciplinary family meet or Palliative Care meeting due by:  day [place]. [facility] ANP-BC Pulmonary and Critical Care Medicine [facility] Pager [phone] or [phone]  01/12/2017, 12:47 PM

## 2017-01-12 NOTE — ED Notes (Signed)
Admitting MD at bedside.

## 2017-01-12 NOTE — ED Triage Notes (Addendum)
Patient ambulatory to triage with steady gait, without difficulty or distress noted; pt reports N/V/D x week accomp by right flank pain, SHOB and prod cough brown sputum; pt taken to room 15 via w/c to be placed on monitor for further evaluation

## 2017-01-12 NOTE — Procedures (Signed)
Central Venous Dailysis Catheter Placement: DOUBLE LUMEN  Indication: Hemo Dialysis/CRRT   Consent:emergent   Hand washing performed prior to starting the procedure.   Procedure: An active timeout was performed and correct patient, name, & ID confirmed. Patient was positioned correctly for central venous access. Patient was prepped using strict sterile technique including chlorohexadine preps, sterile drape, sterile gown and sterile gloves.  The area was prepped, draped and anesthetized in the usual sterile manner. Patient comfort was obtained.    A Double lumen catheter was placed in RT femoral Vein There was good blood return, catheter caps were placed on lumens, catheter flushed easily, the line was secured and a sterile dressing and BIO-PATCH applied.   Ultrasound was used to visualize vasculature and guidance of needle.   Number of Attempts: 1 Complications:none  Estimated Blood Loss: none Operator: Estus Krakowski.   Corrin Parker, M.D.  [facility]  Medical Director [place] Director [facility]io-[facility] Department

## 2017-01-12 NOTE — ED Notes (Signed)
Dr Burlene Arnt at bedside

## 2017-01-12 NOTE — Progress Notes (Signed)
[place], [place] 01/12/17  Subjective:   Patient is known to our practice for outpatient.  He was followed by Dr. Delton Prairie for chronic kidney disease stage IV and Severe hypertension.  He was last seen in February 2018 then lost to follow-up  Patient presents for difficulty breathing, He was found to have accelerated hypertension in the emergency room with diastolic above 921 Patient reports that over the last year he has lost 60-70 pounds.  He has altered taste.  He denies nausea and vomiting for the past several days.  He ran out of his medications and acknowledges that has not he has not taken his medications   Objective:  Vital signs in last 24 hours:  Temp:  [98.9 F (37.2 C)] 98.9 F (37.2 C) (11/13 0653) Pulse Rate:  [78-82] 78 (11/13 0930) Resp:  [18-24] 24 (11/13 0930) BP: (200-223)/(123-143) 223/143 (11/13 0930) SpO2:  [93 %-100 %] 100 % (11/13 0930) Weight:  [90.7 kg (200 lb)] 90.7 kg (200 lb) (11/13 0645)  Weight change:  Filed Weights   01/12/17 0645  Weight: 90.7 kg (200 lb)    Intake/Output:   No intake or output data in the 24 hours ending 01/12/17 1000   Physical Exam: General:  Chronically ill-appearing,  HEENT  muddy sclerae, moist oral mucous membranes  Neck  supple  Pulm/lungs  decreased breath sounds at bases  CVS/Heart  regular rhythm, no rub   Abdomen:  soft, nontender, nondistended  Extremities:  1-2+ edema over ankles and lower legs  Neurologic:  Alert and oriented  Skin:  Dry skin   Access: to be placed       Basic Metabolic Panel:  Recent Labs  Lab 01/12/17 0706  NA 138  K 5.1  CL 111  CO2 15*  GLUCOSE 112*  BUN 117*  CREATININE 13.52*  CALCIUM 8.5*     CBC: Recent Labs  Lab 01/12/17 0706  WBC 6.5  NEUTROABS 4.4  HGB 9.9*  HCT 30.2*  MCV 94.1  PLT 197     No results found for: HEPBSAG, HEPBSAB, HEPBIGM    Microbiology:  No results found for this or any previous visit (from the  past 240 hour(s)).  Coagulation Studies: No results for input(s): LABPROT, INR in the last 72 hours.  Urinalysis: Recent Labs    01/12/17 0848  COLORURINE YELLOW*  LABSPEC 1.010  PHURINE 5.0  GLUCOSEU 50*  HGBUR SMALL*  BILIRUBINUR NEGATIVE  KETONESUR NEGATIVE  PROTEINUR >=300*  NITRITE NEGATIVE  LEUKOCYTESUR NEGATIVE      Imaging: Dg Chest 2 View  Result Date: 01/12/2017 CLINICAL DATA:  Shortness of breath and productive cough for 1 week EXAM: CHEST  2 VIEW COMPARISON:  10/31/2016 FINDINGS: Cardiac shadow is enlarged but stable. The lungs are well aerated bilaterally. Increased central vascular congestion is noted as well as some patchy atelectatic changes. Minimal blunting of the costophrenic angles is noted. No acute bony abnormality is seen. IMPRESSION: Increased vascular congestion with bilateral patchy atelectatic changes. Electronically Signed   By: Inez Catalina M.D.   On: 01/12/2017 07:48   Ct Renal Stone Study  Result Date: 01/12/2017 CLINICAL DATA:  46 year old male on dialysis presenting with nausea, vomiting and diarrhea for the past week accompanying bite right flank pain. Shortness breath and cough. Initial encounter. EXAM: CT ABDOMEN AND PELVIS WITHOUT CONTRAST TECHNIQUE: Multidetector CT imaging of the abdomen and pelvis was performed following the standard protocol without IV contrast. COMPARISON:  12/10/2014 CT.  01/12/2017 chest  x-ray. FINDINGS: Lower chest: Bilateral pleural effusions greater right with fluid extending into the fissures suggestive of pulmonary edema. Cardiomegaly. Trace right coronary artery calcifications. Hepatobiliary: Taking into account limitation by non contrast imaging, no hepatic mass. Small gallstones. Limited for evaluating for gallbladder inflammation given third spacing of fluid. Pancreas: Questionable 9 mm hypodensity within the posterior aspect of the the body of the pancreas (series 2, images 25 and 26). Spleen: Taking into account  limitation by non contrast imaging, no splenic lesion or enlargement. Adrenals/Urinary Tract: No obstructing stone or hydronephrosis. Scattered low-density structures largest 8 mm on the right consistent with cyst. Some of the small lesions too small to characterize although statistically likely cysts. No adrenal mass. Noncontrast filled views of the urinary bladder without mass identified. Stomach/Bowel: Evaluation of bowel is limited by lack of contrast, fat planes and third spacing of fluid. The appendix is gas filled without surrounding inflammation. Vascular/Lymphatic: Atherosclerotic changes aorta with ectasia without focal aneurysm. Ectatic aortic branch vessels including celiac and superior mesenteric artery. Dilated common iliac arteries measuring up to 1.7 cm bilaterally. Scattered normal/ top-normal size lymph nodes possibly reactive in origin. Reproductive: No gross abnormality Other: No free intraperitoneal air.  Third spacing of fluid. Musculoskeletal: Bony overgrowth left pubic region may be related to remote trauma. No acute osseous abnormality. IMPRESSION: Cardiomegaly with bilateral pleural effusion and fluid within fissures suggestive of pulmonary edema. No obstructing stone or hydronephrosis. Evaluation of bowel is limited by lack of contrast, fat planes and third spacing of fluid. The appendix is gas filled without surrounding inflammation. Small gallstones. Limited for evaluating for gallbladder inflammation given third spacing of fluid. Questionable 9 mm hypodensity within the posterior aspect of the the body of the pancreas (series 2, images 25 and 26). Aortic Atherosclerosis (ICD10-I70.0). Ectatic aortic branch vessels including celiac and superior mesenteric artery. Dilated common iliac arteries measuring up to 1.7 cm bilaterally. Scattered normal/ top-normal size lymph nodes possibly reactive in origin. Electronically Signed   By: Genia Del M.D.   On: 01/12/2017 08:17      Medications:    . aspirin  81 mg Oral Daily  . atorvastatin  40 mg Oral Daily  . clopidogrel  75 mg Oral Daily  . fluticasone  1 spray Each Nare Daily  . heparin  5,000 Units Subcutaneous Q8H  . multivitamin  1 tablet Oral Daily   acetaminophen **OR** acetaminophen, nitroGLYCERIN  Assessment/ Plan:  46 y.o. 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  End-stage renal disease.  Patient has had long-standing chronic kidney disease which is presumed to be secondary to hypertension.  He has strong family history of end-stage renal disease.  His brother and his uncle were on dialysis He presents now with uremic symptoms and accelerated hypertension He has AV graft in his left arm which has clotted Discussed risks, benefits and alternatives of dialysis.  Patient was initially hesitant because he has seen poor outcomes of dialysis and his family.  He has now agreed to proceed with dialysis.  He will be admitted to ICU for accelerated hypertension to be placed on IV antihypertensive infusion.  ICU team will place a temporary dialysis catheter.  We will start his dialysis today.  We will consult vascular for PermCath placement Start discharge planning for membranes to be done as patient lives in [place].  Obtain hepatitis studies.  2.  Accelerated hypertension Patient  is experiencing shortness of breath likely due to diastolic dysfunction He does have some volume overload.  He was given Lasix IV 80 mg by the emergency room IV nicardipine infusion for now.  Start oral antihypertensives once blood pressure is better controlled Obtain urine drug screen  3. Anemia - Obtain iron studies with HD - will consider procrit once BP is better controlled         LOS: 0 Fernando Salas 11/13/201810:00 AM  [place], [place]

## 2017-01-13 DIAGNOSIS — I2510 Atherosclerotic heart disease of native coronary artery without angina pectoris: Secondary | ICD-10-CM

## 2017-01-13 DIAGNOSIS — R197 Diarrhea, unspecified: Secondary | ICD-10-CM

## 2017-01-13 DIAGNOSIS — R0602 Shortness of breath: Secondary | ICD-10-CM

## 2017-01-13 DIAGNOSIS — R1110 Vomiting, unspecified: Secondary | ICD-10-CM

## 2017-01-13 DIAGNOSIS — N186 End stage renal disease: Secondary | ICD-10-CM

## 2017-01-13 DIAGNOSIS — R109 Unspecified abdominal pain: Secondary | ICD-10-CM

## 2017-01-13 LAB — BASIC METABOLIC PANEL
ANION GAP: 10 (ref 5–15)
BUN: 92 mg/dL — ABNORMAL HIGH (ref 6–20)
CALCIUM: 7.7 mg/dL — AB (ref 8.9–10.3)
CO2: 20 mmol/L — ABNORMAL LOW (ref 22–32)
Chloride: 109 mmol/L (ref 101–111)
Creatinine, Ser: 10.82 mg/dL — ABNORMAL HIGH (ref 0.61–1.24)
GFR calc Af Amer: 6 mL/min — ABNORMAL LOW (ref >60)
GFR, EST NON AFRICAN AMERICAN: 5 mL/min — AB (ref >60)
Glucose, Bld: 98 mg/dL (ref 65–99)
Potassium: 3.9 mmol/L (ref 3.5–5.1)
SODIUM: 139 mmol/L (ref 135–145)

## 2017-01-13 LAB — CBC
HCT: 25.2 % — ABNORMAL LOW (ref 40.0–52.0)
HEMOGLOBIN: 8.1 g/dL — AB (ref 13.0–18.0)
MCH: 29.8 pg (ref 26.0–34.0)
MCHC: 32.4 g/dL (ref 32.0–36.0)
MCV: 92.2 fL (ref 80.0–100.0)
Platelets: 165 10*3/uL (ref 150–440)
RBC: 2.73 MIL/uL — AB (ref 4.40–5.90)
RDW: 17.3 % — ABNORMAL HIGH (ref 11.5–14.5)
WBC: 7.1 10*3/uL (ref 3.8–10.6)

## 2017-01-13 LAB — HEPATITIS B SURFACE ANTIBODY,QUALITATIVE: Hep B S Ab: NONREACTIVE

## 2017-01-13 LAB — PARATHYROID HORMONE, INTACT (NO CA): PTH: 266 pg/mL — ABNORMAL HIGH (ref 15–65)

## 2017-01-13 LAB — TRANSFERRIN: TRANSFERRIN: 154 mg/dL — AB (ref 180–329)

## 2017-01-13 LAB — MAGNESIUM: MAGNESIUM: 1.8 mg/dL (ref 1.7–2.4)

## 2017-01-13 LAB — HEPATITIS C ANTIBODY

## 2017-01-13 LAB — HEPATITIS B SURFACE ANTIGEN: Hepatitis B Surface Ag: NEGATIVE

## 2017-01-13 LAB — HEPATITIS B CORE ANTIBODY, TOTAL: HEP B C TOTAL AB: NEGATIVE

## 2017-01-13 MED ORDER — LOSARTAN POTASSIUM 50 MG PO TABS
100.0000 mg | ORAL_TABLET | Freq: Every day | ORAL | Status: DC
  Administered 2017-01-13 – 2017-01-15 (×3): 100 mg via ORAL
  Filled 2017-01-13 (×3): qty 2

## 2017-01-13 MED ORDER — RENA-VITE PO TABS
1.0000 | ORAL_TABLET | Freq: Every day | ORAL | Status: DC
  Administered 2017-01-13 – 2017-01-15 (×3): 1 via ORAL
  Filled 2017-01-13 (×4): qty 1

## 2017-01-13 MED ORDER — AMLODIPINE BESYLATE 10 MG PO TABS
10.0000 mg | ORAL_TABLET | Freq: Every day | ORAL | Status: DC
  Administered 2017-01-13 – 2017-01-16 (×4): 10 mg via ORAL
  Filled 2017-01-13 (×4): qty 1

## 2017-01-13 MED ORDER — SODIUM CHLORIDE 0.9 % IV SOLN
200.0000 mg | Freq: Once | INTRAVENOUS | Status: AC
  Administered 2017-01-13: 200 mg via INTRAVENOUS
  Filled 2017-01-13: qty 10

## 2017-01-13 MED ORDER — CARVEDILOL 25 MG PO TABS
25.0000 mg | ORAL_TABLET | Freq: Two times a day (BID) | ORAL | Status: DC
  Administered 2017-01-13 – 2017-01-16 (×5): 25 mg via ORAL
  Filled 2017-01-13 (×5): qty 1

## 2017-01-13 MED ORDER — HYDRALAZINE HCL 20 MG/ML IJ SOLN: 10.0000 mg | INTRAMUSCULAR | Status: DC | PRN

## 2017-01-13 MED ORDER — DOXERCALCIFEROL 4 MCG/2ML IV SOLN
1.0000 ug | Freq: Once | INTRAVENOUS | Status: AC
  Administered 2017-01-13: 1 ug via INTRAVENOUS
  Filled 2017-01-13: qty 2

## 2017-01-13 MED ORDER — MAGNESIUM SULFATE IN D5W 1-5 GM/100ML-% IV SOLN
1.0000 g | Freq: Once | INTRAVENOUS | Status: AC
  Administered 2017-01-13: 1 g via INTRAVENOUS
  Filled 2017-01-13: qty 100

## 2017-01-13 MED ORDER — EPOETIN ALFA 10000 UNIT/ML IJ SOLN
4000.0000 [IU] | Freq: Once | INTRAMUSCULAR | Status: AC
  Administered 2017-01-13: 4000 [IU] via INTRAVENOUS

## 2017-01-13 MED ORDER — ISOSORB DINITRATE-HYDRALAZINE 20-37.5 MG PO TABS
1.0000 | ORAL_TABLET | Freq: Three times a day (TID) | ORAL | Status: DC
  Administered 2017-01-13 – 2017-01-16 (×8): 1 via ORAL
  Filled 2017-01-13 (×12): qty 1

## 2017-01-13 NOTE — Progress Notes (Signed)
Pre HD assessment  

## 2017-01-13 NOTE — Progress Notes (Signed)
Post HD assessment  

## 2017-01-13 NOTE — Progress Notes (Addendum)
Hemodialysis- Patient complains of cramping in leg, back and side. Relieved temporarily with 500cc normal saline and changing to 4K bath. BFR 250. Iron also stopped. Approx 20 minutes later patient states he is cramping severely again. Dr. Candiss Norse notified. Order to stop treatment and draw magnesium level. Done. Patient currently reports decrease in cramping but states "its still there." Will report off to primary RN.  Patient only received approx 1/2 of iron dose.

## 2017-01-13 NOTE — Progress Notes (Signed)
Patient seen and examined full consult to follow.  Patient feels comfortable denies pain currently dialyzed with a right femoral catheter.  Physical examination demonstrates the skin and soft tissues around the right and left neck are clean dry and intact no evidence of scarring or infection.  Left brachial axillary graft present uninfected but thrombosed.  End-stage renal disease now on hemodialysis with associated complication of dialysis device  I will plan for permacath placement on Friday in the morning so the dialysis can be arranged.  Subsequently as an outpatient further vein mapping evaluation for upper extremity access and possibly salvage of the left arm access will be entertained.

## 2017-01-13 NOTE — Progress Notes (Signed)
HD tx end  

## 2017-01-13 NOTE — Progress Notes (Signed)
HD tx start 

## 2017-01-13 NOTE — Progress Notes (Signed)
[facility], [place] 01/13/17  Subjective:   Patient tolerated his first dialysis treatment well yesterday.  Feels a little better.  Appetite has improved.  No nausea or vomiting reported.  He had cramping during dialysis yesterday.  Improved with mustard.  Leg edema has resolved.  He is off of the nicardipine drip.  Blood pressure control has improved some.  He is getting started on his oral antihypertensive regimen today. Lab results show iron deficiency anemia with transferrin saturations 10%.  Hemoglobin is 8.1.  Platelet count is normal.  Calcium is low at 7.7.  PTH is high at 266 suggesting secondary hyperparathyroidism.Phosphorus level is in the normal range at 4.5.  Objective:  Vital signs in last 24 hours:  Temp:  [97.8 F (36.6 C)-98.5 F (36.9 C)] 97.8 F (36.6 C) (11/14 0800) Pulse Rate:  [74-89] 80 (11/14 0800) Resp:  [12-29] 29 (11/14 0800) BP: (139-238)/(68-145) 182/104 (11/14 0800) SpO2:  [91 %-100 %] 99 % (11/14 0800) Weight:  [88.6 kg (195 lb 5.2 oz)-89 kg (196 lb 3.4 oz)] 88.6 kg (195 lb 5.2 oz) (11/13 1758)  Weight change: -1.719 kg (-12.7 oz) Filed Weights   01/12/17 1119 01/12/17 1545 01/12/17 1758  Weight: 89 kg (196 lb 3.4 oz) 89 kg (196 lb 3.4 oz) 88.6 kg (195 lb 5.2 oz)    Intake/Output:    Intake/Output Summary (Last 24 hours) at 01/13/2017 1000 Last data filed at 01/13/2017 0900 Gross per 24 hour  Intake 400 ml  Output 1560 ml  Net -1160 ml     Physical Exam: General:  Chronically ill-appearing,  HEENT  muddy sclerae, moist oral mucous membranes  Neck  supple  Pulm/lungs  decreased breath sounds at bases  CVS/Heart  regular rhythm, no rub   Abdomen:  soft, nontender, nondistended  Extremities:  no edema   Neurologic:  Alert and oriented  Skin:  Dry skin   Access:  Femoral temporary dialysis catheter       Basic Metabolic Panel:  Recent Labs  Lab 01/12/17 0706 01/12/17 1518 01/12/17 2242 01/13/17 0555   NA 138  --  139 139  K 5.1  --  3.7 3.9  CL 111  --  108 109  CO2 15*  --  19* 20*  GLUCOSE 112*  --  118* 98  BUN 117*  --  86* 92*  CREATININE 13.52*  --  10.48* 10.82*  CALCIUM 8.5*  --  7.9* 7.7*  MG  --   --  2.0  --   PHOS  --  6.0* 4.5  --      CBC: Recent Labs  Lab 01/12/17 0706 01/13/17 0555  WBC 6.5 7.1  NEUTROABS 4.4  --   HGB 9.9* 8.1*  HCT 30.2* 25.2*  MCV 94.1 92.2  PLT 197 165      Lab Results  Component Value Date   HEPBSAG Negative 01/12/2017   HEPBSAB Non Reactive 01/12/2017      Microbiology:  Recent Results (from the past 240 hour(s))  MRSA PCR Screening     Status: None   Collection Time: 01/12/17 11:22 AM  Result Value Ref Range Status   MRSA by PCR NEGATIVE NEGATIVE Final    Comment:        The GeneXpert MRSA Assay (FDA approved for NASAL specimens only), is one component of a comprehensive MRSA colonization surveillance program. It is not intended to diagnose MRSA infection nor to guide or monitor treatment for MRSA infections.  Coagulation Studies: No results for input(s): LABPROT, INR in the last 72 hours.  Urinalysis: Recent Labs    01/12/17 0848  COLORURINE YELLOW*  LABSPEC 1.010  PHURINE 5.0  GLUCOSEU 50*  HGBUR SMALL*  BILIRUBINUR NEGATIVE  KETONESUR NEGATIVE  PROTEINUR >=300*  NITRITE NEGATIVE  LEUKOCYTESUR NEGATIVE      Imaging: Dg Chest 2 View  Result Date: 01/12/2017 CLINICAL DATA:  Shortness of breath and productive cough for 1 week EXAM: CHEST  2 VIEW COMPARISON:  10/31/2016 FINDINGS: Cardiac shadow is enlarged but stable. The lungs are well aerated bilaterally. Increased central vascular congestion is noted as well as some patchy atelectatic changes. Minimal blunting of the costophrenic angles is noted. No acute bony abnormality is seen. IMPRESSION: Increased vascular congestion with bilateral patchy atelectatic changes. Electronically Signed   By: Inez Catalina M.D.   On: 01/12/2017 07:48   Ct  Renal Stone Study  Result Date: 01/12/2017 CLINICAL DATA:  46 year old male on dialysis presenting with nausea, vomiting and diarrhea for the past week accompanying bite right flank pain. Shortness breath and cough. Initial encounter. EXAM: CT ABDOMEN AND PELVIS WITHOUT CONTRAST TECHNIQUE: Multidetector CT imaging of the abdomen and pelvis was performed following the standard protocol without IV contrast. COMPARISON:  12/10/2014 CT.  01/12/2017 chest x-ray. FINDINGS: Lower chest: Bilateral pleural effusions greater right with fluid extending into the fissures suggestive of pulmonary edema. Cardiomegaly. Trace right coronary artery calcifications. Hepatobiliary: Taking into account limitation by non contrast imaging, no hepatic mass. Small gallstones. Limited for evaluating for gallbladder inflammation given third spacing of fluid. Pancreas: Questionable 9 mm hypodensity within the posterior aspect of the the body of the pancreas (series 2, images 25 and 26). Spleen: Taking into account limitation by non contrast imaging, no splenic lesion or enlargement. Adrenals/Urinary Tract: No obstructing stone or hydronephrosis. Scattered low-density structures largest 8 mm on the right consistent with cyst. Some of the small lesions too small to characterize although statistically likely cysts. No adrenal mass. Noncontrast filled views of the urinary bladder without mass identified. Stomach/Bowel: Evaluation of bowel is limited by lack of contrast, fat planes and third spacing of fluid. The appendix is gas filled without surrounding inflammation. Vascular/Lymphatic: Atherosclerotic changes aorta with ectasia without focal aneurysm. Ectatic aortic branch vessels including celiac and superior mesenteric artery. Dilated common iliac arteries measuring up to 1.7 cm bilaterally. Scattered normal/ top-normal size lymph nodes possibly reactive in origin. Reproductive: No gross abnormality Other: No free intraperitoneal air.  Third  spacing of fluid. Musculoskeletal: Bony overgrowth left pubic region may be related to remote trauma. No acute osseous abnormality. IMPRESSION: Cardiomegaly with bilateral pleural effusion and fluid within fissures suggestive of pulmonary edema. No obstructing stone or hydronephrosis. Evaluation of bowel is limited by lack of contrast, fat planes and third spacing of fluid. The appendix is gas filled without surrounding inflammation. Small gallstones. Limited for evaluating for gallbladder inflammation given third spacing of fluid. Questionable 9 mm hypodensity within the posterior aspect of the the body of the pancreas (series 2, images 25 and 26). Aortic Atherosclerosis (ICD10-I70.0). Ectatic aortic branch vessels including celiac and superior mesenteric artery. Dilated common iliac arteries measuring up to 1.7 cm bilaterally. Scattered normal/ top-normal size lymph nodes possibly reactive in origin. Electronically Signed   By: Genia Del M.D.   On: 01/12/2017 08:17     Medications:   . iron sucrose     . amLODipine  10 mg Oral Daily  . aspirin EC  81 mg Oral Daily  .  atorvastatin  40 mg Oral Daily  . carvedilol  25 mg Oral BID  . clopidogrel  75 mg Oral Daily  . epoetin (EPOGEN/PROCRIT) injection  4,000 Units Intravenous Once  . fluticasone  1 spray Each Nare Daily  . furosemide  80 mg Intravenous Daily  . heparin  5,000 Units Subcutaneous Q8H  . Influenza vac split quadrivalent PF  0.5 mL Intramuscular Tomorrow-1000  . isosorbide-hydrALAZINE  1 tablet Oral TID  . multivitamin with minerals  1 tablet Oral Daily  . pneumococcal 23 valent vaccine  0.5 mL Intramuscular Tomorrow-1000  . tuberculin  5 Units Intradermal Once   acetaminophen **OR** [DISCONTINUED] acetaminophen, heparin, hydrALAZINE, nitroGLYCERIN  Assessment/ Plan:  46 y.o. African-American male right coronary disease, COPD, hyperlipidemia, proteinuria, secondary hyperparathyroidism, sleep apnea, stroke, Angioedema secondary  to clonidine now presents with uremic symptoms  1.  End-stage renal disease.  Patient has had long-standing chronic kidney disease which is presumed to be secondary to hypertension.  He has strong family history of end-stage renal disease.  His brother and his uncle were on dialysis First hemodialysis treatment November 13 Outpatient discharge planning is in progress for [place] unit.  Vascular surgery has been consulted for PermCath placement and evaluation for AV access placement.  Patient has failed grafts in his left arm.  2.  Accelerated hypertension Oral regimen has been started. Add losartan  3. Anemia -Iron deficiency.  Venofer and EPO supplementation during dialysis  4.  Secondary hyperparathyroidism with low calcium Start iv hectorol with dialysis  5.  Patient refusing Pneumovax and flu vaccine          LOS: 1 Ivor Kishi 11/14/201810:00 [place], [place]

## 2017-01-13 NOTE — Progress Notes (Signed)
Patient ID: Fernando Salas, male   DOB: [birth year], 46 y.o.   MRN: [id number]  [facility] PROGRESS NOTE  Fernando Salas:[id number] DOB: [birth year] DOA: 01/12/2017 PCP: [name], MD  HPI/Subjective: Patient stating he is feeling a lot better today than yesterday.  Nausea vomiting has resolved.  Had a normal bowel movement.  No further abdominal pain.  Breathing a lot better.  Objective: Vitals:   01/13/17 0600 01/13/17 0800  BP: (!) 181/105 (!) 182/104  Pulse: 79 80  Resp: 19 (!) 29  Temp:  97.8 F (36.6 C)  SpO2: 97% 99%    Filed Weights   01/12/17 1119 01/12/17 1545 01/12/17 1758  Weight: 89 kg (196 lb 3.4 oz) 89 kg (196 lb 3.4 oz) 88.6 kg (195 lb 5.2 oz)    ROS: Review of Systems  Constitutional: Negative for chills and fever.  Eyes: Negative for blurred vision.  Respiratory: Negative for cough and shortness of breath.   Cardiovascular: Negative for chest pain.  Gastrointestinal: Negative for abdominal pain, constipation, diarrhea, nausea and vomiting.  Genitourinary: Negative for dysuria.  Musculoskeletal: Negative for joint pain.  Neurological: Negative for dizziness and headaches.   Exam: Physical Exam  Constitutional: He is oriented to person, place, and time.  HENT:  Nose: No mucosal edema.  Mouth/Throat: No oropharyngeal exudate or posterior oropharyngeal edema.  Eyes: Conjunctivae, EOM and lids are normal. Pupils are equal, round, and reactive to light.  Neck: No JVD present. Carotid bruit is not present. No edema present. No thyroid mass and no thyromegaly present.  Cardiovascular: S1 normal and S2 normal. Exam reveals no gallop.  No murmur heard. Pulses:      Dorsalis pedis pulses are 2+ on the right side, and 2+ on the left side.  Respiratory: No respiratory distress. He has decreased breath sounds in the right lower field and the left lower field. He has no wheezes. He has no rhonchi. He has no rales.  GI: Soft. Bowel sounds are normal.  There is no tenderness.  Musculoskeletal:       Right ankle: He exhibits no swelling.       Left ankle: He exhibits no swelling.  Lymphadenopathy:    He has no cervical adenopathy.  Neurological: He is alert and oriented to person, place, and time. No cranial nerve deficit.  Skin: Skin is warm. No rash noted. Nails show no clubbing.  Psychiatric: He has a normal mood and affect.      Data Reviewed: Basic Metabolic Panel: Recent Labs  Lab 01/12/17 0706 01/12/17 1518 01/12/17 2242 01/13/17 0555  NA 138  --  139 139  K 5.1  --  3.7 3.9  CL 111  --  108 109  CO2 15*  --  19* 20*  GLUCOSE 112*  --  118* 98  BUN 117*  --  86* 92*  CREATININE 13.52*  --  10.48* 10.82*  CALCIUM 8.5*  --  7.9* 7.7*  MG  --   --  2.0  --   PHOS  --  6.0* 4.5  --    Liver Function Tests: Recent Labs  Lab 01/12/17 0706  AST 23  ALT 26  ALKPHOS 66  BILITOT 1.0  PROT 7.2  ALBUMIN 3.5   Recent Labs  Lab 01/12/17 0706  LIPASE 36   CBC: Recent Labs  Lab 01/12/17 0706 01/13/17 0555  WBC 6.5 7.1  NEUTROABS 4.4  --   HGB 9.9* 8.1*  HCT 30.2* 25.2*  MCV 94.1 92.2  PLT 197 165   Cardiac Enzymes: Recent Labs  Lab 01/12/17 0706  TROPONINI 0.10*   BNP (last 3 results) Recent Labs    11/01/16 0448  BNP 2,699.0*      Recent Results (from the past 240 hour(s))  MRSA PCR Screening     Status: None   Collection Time: 01/12/17 11:22 AM  Result Value Ref Range Status   MRSA by PCR NEGATIVE NEGATIVE Final    Comment:        The GeneXpert MRSA Assay (FDA approved for NASAL specimens only), is one component of a comprehensive MRSA colonization surveillance program. It is not intended to diagnose MRSA infection nor to guide or monitor treatment for MRSA infections.      Studies: Dg Chest 2 View  Result Date: 01/12/2017 CLINICAL DATA:  Shortness of breath and productive cough for 1 week EXAM: CHEST  2 VIEW COMPARISON:  10/31/2016 FINDINGS: Cardiac shadow is enlarged but  stable. The lungs are well aerated bilaterally. Increased central vascular congestion is noted as well as some patchy atelectatic changes. Minimal blunting of the costophrenic angles is noted. No acute bony abnormality is seen. IMPRESSION: Increased vascular congestion with bilateral patchy atelectatic changes. Electronically Signed   By: Inez Catalina M.D.   On: 01/12/2017 07:48   Ct Renal Stone Study  Result Date: 01/12/2017 CLINICAL DATA:  46 year old male on dialysis presenting with nausea, vomiting and diarrhea for the past week accompanying bite right flank pain. Shortness breath and cough. Initial encounter. EXAM: CT ABDOMEN AND PELVIS WITHOUT CONTRAST TECHNIQUE: Multidetector CT imaging of the abdomen and pelvis was performed following the standard protocol without IV contrast. COMPARISON:  12/10/2014 CT.  01/12/2017 chest x-ray. FINDINGS: Lower chest: Bilateral pleural effusions greater right with fluid extending into the fissures suggestive of pulmonary edema. Cardiomegaly. Trace right coronary artery calcifications. Hepatobiliary: Taking into account limitation by non contrast imaging, no hepatic mass. Small gallstones. Limited for evaluating for gallbladder inflammation given third spacing of fluid. Pancreas: Questionable 9 mm hypodensity within the posterior aspect of the the body of the pancreas (series 2, images 25 and 26). Spleen: Taking into account limitation by non contrast imaging, no splenic lesion or enlargement. Adrenals/Urinary Tract: No obstructing stone or hydronephrosis. Scattered low-density structures largest 8 mm on the right consistent with cyst. Some of the small lesions too small to characterize although statistically likely cysts. No adrenal mass. Noncontrast filled views of the urinary bladder without mass identified. Stomach/Bowel: Evaluation of bowel is limited by lack of contrast, fat planes and third spacing of fluid. The appendix is gas filled without surrounding  inflammation. Vascular/Lymphatic: Atherosclerotic changes aorta with ectasia without focal aneurysm. Ectatic aortic branch vessels including celiac and superior mesenteric artery. Dilated common iliac arteries measuring up to 1.7 cm bilaterally. Scattered normal/ top-normal size lymph nodes possibly reactive in origin. Reproductive: No gross abnormality Other: No free intraperitoneal air.  Third spacing of fluid. Musculoskeletal: Bony overgrowth left pubic region may be related to remote trauma. No acute osseous abnormality. IMPRESSION: Cardiomegaly with bilateral pleural effusion and fluid within fissures suggestive of pulmonary edema. No obstructing stone or hydronephrosis. Evaluation of bowel is limited by lack of contrast, fat planes and third spacing of fluid. The appendix is gas filled without surrounding inflammation. Small gallstones. Limited for evaluating for gallbladder inflammation given third spacing of fluid. Questionable 9 mm hypodensity within the posterior aspect of the the body of the pancreas (series 2, images 25 and 26). Aortic Atherosclerosis (  ICD10-I70.0). Ectatic aortic branch vessels including celiac and superior mesenteric artery. Dilated common iliac arteries measuring up to 1.7 cm bilaterally. Scattered normal/ top-normal size lymph nodes possibly reactive in origin. Electronically Signed   By: Genia Del M.D.   On: 01/12/2017 08:17    Scheduled Meds: . amLODipine  10 mg Oral Daily  . aspirin EC  81 mg Oral Daily  . atorvastatin  40 mg Oral Daily  . carvedilol  25 mg Oral BID  . clopidogrel  75 mg Oral Daily  . epoetin (EPOGEN/PROCRIT) injection  4,000 Units Intravenous Once  . fluticasone  1 spray Each Nare Daily  . furosemide  80 mg Intravenous Daily  . heparin  5,000 Units Subcutaneous Q8H  . Influenza vac split quadrivalent PF  0.5 mL Intramuscular Tomorrow-1000  . isosorbide-hydrALAZINE  1 tablet Oral TID  . multivitamin with minerals  1 tablet Oral Daily  .  pneumococcal 23 valent vaccine  0.5 mL Intramuscular Tomorrow-1000  . tuberculin  5 Units Intradermal Once   Continuous Infusions: . iron sucrose      Assessment/Plan:  1. Accelerated hypertension.  Patient now off Cardene drip and restarted on oral medications.  Amlodipine, Coreg, Lasix, hydralazine and isosorbide combination.  Likely will need titration of medications. 2. Uremia with worsening kidney function.  Acute kidney injury on chronic kidney disease.  Now progressed to end-stage renal disease.  Dialysis done yesterday and will be done again today.  Temporary dialysis catheter placed by CCU specialist in right groin.  Will need a PermCath prior to disposition.  Will also need outpatient dialysis slot prior to disposition.  Patient interested in dialysis center in [place]. 3. Acute congestive heart failure and fluid overload.  Echocardiogram to determine EF.  IV Lasix daily.  Dialysis to help out with fluid. 4. History of CAD on aspirin, Plavix and Lipitor and restarted on Coreg 5. Tobacco abuse.  Refused nicotine patch 6. Marijuana abuse  Code Status:     Code Status Orders  (From admission, onward)        Start     Ordered   01/12/17 0954  Full code  Continuous     01/12/17 0954    Code Status History    Date Active Date Inactive Code Status Order ID Comments User Context   11/01/2016 03:29 11/02/2016 15:25 Full Code [id number]  Hugelmeyer, Ubaldo Glassing, DO Inpatient     Family Communication: Permission to speak in front of family at bedside Disposition Plan: Will need 3 dialysis sessions, PermCath placement and outpatient dialysis slot prior to disposition  Consultants:  Critical care specialist  Nephrology  Vascular surgery consulted for PermCath  Procedures:  Temporary dialysis catheter right groin  Time spent: 28 minutes  [place], [place]

## 2017-01-13 NOTE — Progress Notes (Signed)
Pt returned from dialysis

## 2017-01-13 NOTE — Progress Notes (Signed)
Pt to dialysis.

## 2017-01-14 LAB — TROPONIN I
TROPONIN I: 0.1 ng/mL — AB (ref <0.03)
Troponin I: 0.1 ng/mL (ref <0.03)
Troponin I: 0.09 ng/mL (ref <0.03)

## 2017-01-14 MED ORDER — EPOETIN ALFA 10000 UNIT/ML IJ SOLN
4000.0000 [IU] | Freq: Once | INTRAMUSCULAR | Status: AC
  Administered 2017-01-14: 4000 [IU] via INTRAVENOUS

## 2017-01-14 MED ORDER — DEXTROSE 5 % IV SOLN
2.0000 g | INTRAVENOUS | Status: AC
  Administered 2017-01-15: 2 g via INTRAVENOUS
  Filled 2017-01-14: qty 2000

## 2017-01-14 MED ORDER — CEFAZOLIN SODIUM-DEXTROSE 2-4 GM/100ML-% IV SOLN
2.0000 g | INTRAVENOUS | Status: DC
  Filled 2017-01-14: qty 100

## 2017-01-14 MED ORDER — DOXERCALCIFEROL 4 MCG/2ML IV SOLN: 1.0000 ug | Freq: Once | INTRAVENOUS | Status: DC

## 2017-01-14 MED ORDER — MORPHINE SULFATE (PF) 2 MG/ML IV SOLN
2.0000 mg | INTRAVENOUS | Status: AC
  Administered 2017-01-14: 2 mg via INTRAVENOUS
  Filled 2017-01-14: qty 1

## 2017-01-14 MED ORDER — FERROUS GLUCONATE 324 (38 FE) MG PO TABS
324.0000 mg | ORAL_TABLET | Freq: Every day | ORAL | Status: DC
  Administered 2017-01-16: 324 mg via ORAL
  Filled 2017-01-14 (×2): qty 1

## 2017-01-14 NOTE — Progress Notes (Signed)
Hd TX started 

## 2017-01-14 NOTE — Progress Notes (Signed)
Complains of chest pain at 0335. [place], NP informed.  NTG sublingual administered x3.  2mg  morphine administered after NTG.  Stat EKG performed which revealed sinus rhythm.  Troponin ordered.  Currently with no chest pain and no other complaints.  Transfer to floor canceled.  Will continue to monitor.

## 2017-01-14 NOTE — Progress Notes (Signed)
Pt off the floor to the dialysis clinic

## 2017-01-14 NOTE — Progress Notes (Signed)
Patient ID: Fernando Salas, male   DOB: 06/03/1970, 46 y.o.   MRN: [id number]  [facility] PROGRESS NOTE  TYRUS WILMS TGP:[id number] DOB: [birth year] DOA: 01/12/2017 PCP: [name], MD  HPI/Subjective:  feeling better, no chest pain.  Had headache with nitroglycerin that was given last night.  Had leg cramps and leg cramps migrated to the chest, patient received nitroglycerin for chest pain but he never had actual chest pain and he said it is like a cramp.  No chest pain today, scheduled for hemodialysis today, for permacath placement tomorrow.  Objective: Vitals:   01/14/17 1000 01/14/17 1107  BP: (!) 167/96 (!) 174/80  Pulse: 67 60  Resp: 18 16  Temp:  98 F (36.7 C)  SpO2: 97% 100%    Filed Weights   01/12/17 1545 01/12/17 1758 01/13/17 1055  Weight: 89 kg (196 lb 3.4 oz) 88.6 kg (195 lb 5.2 oz) 87.3 kg (192 lb 7.4 oz)    ROS: Review of Systems  Constitutional: Negative for chills and fever.  Eyes: Negative for blurred vision.  Respiratory: Negative for cough and shortness of breath.   Cardiovascular: Negative for chest pain.  Gastrointestinal: Negative for abdominal pain, constipation, diarrhea, nausea and vomiting.  Genitourinary: Negative for dysuria.  Musculoskeletal: Negative for joint pain.  Neurological: Negative for dizziness and headaches.   Exam: Physical Exam  Constitutional: He is oriented to person, place, and time.  HENT:  Nose: No mucosal edema.  Mouth/Throat: No oropharyngeal exudate or posterior oropharyngeal edema.  Eyes: Conjunctivae, EOM and lids are normal. Pupils are equal, round, and reactive to light.  Neck: No JVD present. Carotid bruit is not present. No edema present. No thyroid mass and no thyromegaly present.  Cardiovascular: S1 normal and S2 normal. Exam reveals no gallop.  No murmur heard. Pulses:      Dorsalis pedis pulses are 2+ on the right side, and 2+ on the left side.  Respiratory: No respiratory distress. He has no  decreased breath sounds. He has no wheezes. He has no rhonchi. He has no rales.  GI: Soft. Bowel sounds are normal. There is no tenderness.  Musculoskeletal:       Right ankle: He exhibits no swelling.       Left ankle: He exhibits no swelling.  Lymphadenopathy:    He has no cervical adenopathy.  Neurological: He is alert and oriented to person, place, and time. No cranial nerve deficit.  Skin: Skin is warm. No rash noted. Nails show no clubbing.  Psychiatric: He has a normal mood and affect.      Data Reviewed: Basic Metabolic Panel: Recent Labs  Lab 01/12/17 0706 01/12/17 1518 01/12/17 2242 01/13/17 0555 01/13/17 1300  NA 138  --  139 139  --   K 5.1  --  3.7 3.9  --   CL 111  --  108 109  --   CO2 15*  --  19* 20*  --   GLUCOSE 112*  --  118* 98  --   BUN 117*  --  86* 92*  --   CREATININE 13.52*  --  10.48* 10.82*  --   CALCIUM 8.5*  --  7.9* 7.7*  --   MG  --   --  2.0  --  1.8  PHOS  --  6.0* 4.5  --   --    Liver Function Tests: Recent Labs  Lab 01/12/17 0706  AST 23  ALT 26  ALKPHOS 66  BILITOT 1.0  PROT 7.2  ALBUMIN 3.5   Recent Labs  Lab 01/12/17 0706  LIPASE 36   CBC: Recent Labs  Lab 01/12/17 0706 01/13/17 0555  WBC 6.5 7.1  NEUTROABS 4.4  --   HGB 9.9* 8.1*  HCT 30.2* 25.2*  MCV 94.1 92.2  PLT 197 165   Cardiac Enzymes: Recent Labs  Lab 01/12/17 0706 01/14/17 0600 01/14/17 0941  TROPONINI 0.10* 0.10* 0.10*   BNP (last 3 results) Recent Labs    11/01/16 0448  BNP 2,699.0*      Recent Results (from the past 240 hour(s))  MRSA PCR Screening     Status: None   Collection Time: 01/12/17 11:22 AM  Result Value Ref Range Status   MRSA by PCR NEGATIVE NEGATIVE Final    Comment:        The GeneXpert MRSA Assay (FDA approved for NASAL specimens only), is one component of a comprehensive MRSA colonization surveillance program. It is not intended to diagnose MRSA infection nor to guide or monitor treatment for MRSA  infections.      Studies: No results found.  Scheduled Meds: . amLODipine  10 mg Oral Daily  . aspirin EC  81 mg Oral Daily  . atorvastatin  40 mg Oral Daily  . carvedilol  25 mg Oral BID  . clopidogrel  75 mg Oral Daily  . doxercalciferol  1 mcg Intravenous Once  . epoetin (EPOGEN/PROCRIT) injection  4,000 Units Intravenous Once  . [START ON 01/15/2017] ferrous gluconate  324 mg Oral Q breakfast  . fluticasone  1 spray Each Nare Daily  . heparin  5,000 Units Subcutaneous Q8H  . Influenza vac split quadrivalent PF  0.5 mL Intramuscular Tomorrow-1000  . isosorbide-hydrALAZINE  1 tablet Oral TID  . losartan  100 mg Oral QHS  . multivitamin  1 tablet Oral QHS  . pneumococcal 23 valent vaccine  0.5 mL Intramuscular Tomorrow-1000  . tuberculin  5 Units Intradermal Once   Continuous Infusions:   Assessment/Plan:  1. Accelerated hypertension.  Patient now off Cardene drip and restarted on oral medications.  Continue Coreg, losartan,  BiDil, 2.  3. Acombination.  Likely will need titration of medications. 4. Uremia with worsening kidney function.  Acute kidney injury on chronic kidney disease.  Now progressed to end-stage renal disease.  Dialysis done yesterday and will be done again today.  Temporary dialysis catheter placed by CCU specialist in right groin.  It is going for permacath placement tomorrow.  5.  Acute congestive heart failure and fluid overload.  Will order echocardiogram.. 6. History of CAD on aspirin, Plavix and Lipitor and restarted on Coreg 7. Tobacco abuse.  Refused nicotine patch 8. Marijuana abuse  Code Status:     Code Status Orders  (From admission, onward)        Start     Ordered   01/12/17 0954  Full code  Continuous     01/12/17 0954    Code Status History    Date Active Date Inactive Code Status Order ID Comments User Context   11/01/2016 03:29 11/02/2016 15:25 Full Code [id number]  Hugelmeyer, Ubaldo Glassing, DO Inpatient     Family Communication:  Permission to speak in front of family at bedside Disposition Plan:  Consultants:  Critical care specialist  Nephrology  Vascular surgery consulted for PermCath  Procedures:  Temporary dialysis catheter right groin  Time spent:30  [facility]

## 2017-01-14 NOTE — Progress Notes (Signed)
Pt received to the room from the ICU.  Wife with the pt. Pt alert and oriented x4, no complaints of pain or discomfort.  Bed in low position, call bell within reach.  Bed alarms on and functioning.  Assessment done and charted.  Will continue to monitor and do hourly rounding throughout the shift

## 2017-01-14 NOTE — Progress Notes (Signed)
[facility], [place] 01/14/17  Subjective:   Patient had cramping during HD requiring discontinuation of treatment He stated that it started in his left calf and migrated to chest No chest pain this morning BP control is better   Objective:  Vital signs in last 24 hours:  Temp:  [98 F (36.7 C)-98.3 F (36.8 C)] 98 F (36.7 C) (11/15 1107) Pulse Rate:  [60-74] 60 (11/15 1107) Resp:  [6-25] 16 (11/15 1107) BP: (143-185)/(80-127) 174/80 (11/15 1107) SpO2:  [92 %-100 %] 100 % (11/15 1107)  Weight change: -1.7 kg (-12 oz) Filed Weights   01/12/17 1545 01/12/17 1758 01/13/17 1055  Weight: 89 kg (196 lb 3.4 oz) 88.6 kg (195 lb 5.2 oz) 87.3 kg (192 lb 7.4 oz)    Intake/Output:    Intake/Output Summary (Last 24 hours) at 01/14/2017 1246 Last data filed at 01/14/2017 0900 Gross per 24 hour  Intake 440 ml  Output -875 ml  Net 1315 ml     Physical Exam: General:  Chronically ill-appearing,  HEENT  muddy sclerae, moist oral mucous membranes  Neck  supple  Pulm/lungs  decreased breath sounds at bases  CVS/Heart  regular rhythm, no rub   Abdomen:  soft, nontender, nondistended  Extremities:  no edema   Neurologic:  Alert and oriented  Skin:  Dry skin   Access:  Femoral temporary dialysis catheter       Basic Metabolic Panel:  Recent Labs  Lab 01/12/17 0706 01/12/17 1518 01/12/17 2242 01/13/17 0555 01/13/17 1300  NA 138  --  139 139  --   K 5.1  --  3.7 3.9  --   CL 111  --  108 109  --   CO2 15*  --  19* 20*  --   GLUCOSE 112*  --  118* 98  --   BUN 117*  --  86* 92*  --   CREATININE 13.52*  --  10.48* 10.82*  --   CALCIUM 8.5*  --  7.9* 7.7*  --   MG  --   --  2.0  --  1.8  PHOS  --  6.0* 4.5  --   --      CBC: Recent Labs  Lab 01/12/17 0706 01/13/17 0555  WBC 6.5 7.1  NEUTROABS 4.4  --   HGB 9.9* 8.1*  HCT 30.2* 25.2*  MCV 94.1 92.2  PLT 197 165      Lab Results  Component Value Date   HEPBSAG Negative  01/12/2017   HEPBSAB Non Reactive 01/12/2017      Microbiology:  Recent Results (from the past 240 hour(s))  MRSA PCR Screening     Status: None   Collection Time: 01/12/17 11:22 AM  Result Value Ref Range Status   MRSA by PCR NEGATIVE NEGATIVE Final    Comment:        The GeneXpert MRSA Assay (FDA approved for NASAL specimens only), is one component of a comprehensive MRSA colonization surveillance program. It is not intended to diagnose MRSA infection nor to guide or monitor treatment for MRSA infections.     Coagulation Studies: No results for input(s): LABPROT, INR in the last 72 hours.  Urinalysis: Recent Labs    01/12/17 0848  COLORURINE YELLOW*  LABSPEC 1.010  PHURINE 5.0  GLUCOSEU 50*  HGBUR SMALL*  BILIRUBINUR NEGATIVE  KETONESUR NEGATIVE  PROTEINUR >=300*  NITRITE NEGATIVE  LEUKOCYTESUR NEGATIVE      Imaging: No results found.   Medications:    .  amLODipine  10 mg Oral Daily  . aspirin EC  81 mg Oral Daily  . atorvastatin  40 mg Oral Daily  . carvedilol  25 mg Oral BID  . clopidogrel  75 mg Oral Daily  . fluticasone  1 spray Each Nare Daily  . furosemide  80 mg Intravenous Daily  . heparin  5,000 Units Subcutaneous Q8H  . Influenza vac split quadrivalent PF  0.5 mL Intramuscular Tomorrow-1000  . isosorbide-hydrALAZINE  1 tablet Oral TID  . losartan  100 mg Oral QHS  . multivitamin  1 tablet Oral QHS  . pneumococcal 23 valent vaccine  0.5 mL Intramuscular Tomorrow-1000  . tuberculin  5 Units Intradermal Once   acetaminophen **OR** [DISCONTINUED] acetaminophen, heparin, hydrALAZINE, nitroGLYCERIN  Assessment/ Plan:  46 y.o. 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  End-stage renal disease.  Patient has had long-standing chronic kidney disease which is presumed to be secondary  to hypertension.  He has strong family history of end-stage renal disease.  His brother and his uncle were on dialysis First hemodialysis treatment November 13 Outpatient discharge planning is in progress for [place] [facility].  Vascular surgery has been consulted for PermCath placement and evaluation for AV access placement.  Patient has failed grafts in his left arm. 4K, 3 Ca bath to prevent cramping No UF  2.  Accelerated hypertension Oral regimen has been started. Monitor response  3. Anemia -Iron deficiency.    EPO supplementation during dialysis  4.  Secondary hyperparathyroidism with low calcium Iv hectorol  5.  Patient refusing Pneumovax and flu vaccine          LOS: 2 [facility] 11/15/201812:46 PM  [facility] [place], [place]

## 2017-01-14 NOTE — Progress Notes (Signed)
HD TX ended  

## 2017-01-14 NOTE — Progress Notes (Signed)
PRE HD   

## 2017-01-14 NOTE — Progress Notes (Signed)
PRE HD assessment 

## 2017-01-15 ENCOUNTER — Encounter: Payer: Self-pay | Admitting: Vascular Surgery

## 2017-01-15 ENCOUNTER — Encounter: Payer: Self-pay | Admitting: Certified Registered"

## 2017-01-15 ENCOUNTER — Encounter
Admission: EM | Disposition: A | Discharge status: Home / Self Care | Payer: Self-pay | Source: Home / Self Care | Attending: Internal Medicine

## 2017-01-15 DIAGNOSIS — N186 End stage renal disease: Secondary | ICD-10-CM

## 2017-01-15 HISTORY — PX: DIALYSIS/PERMA CATHETER INSERTION: CATH118288

## 2017-01-15 LAB — BASIC METABOLIC PANEL
ANION GAP: 9 (ref 5–15)
BUN: 51 mg/dL — ABNORMAL HIGH (ref 6–20)
CALCIUM: 7.7 mg/dL — AB (ref 8.9–10.3)
CO2: 26 mmol/L (ref 22–32)
CREATININE: 7.35 mg/dL — AB (ref 0.61–1.24)
Chloride: 104 mmol/L (ref 101–111)
GFR, EST AFRICAN AMERICAN: 9 mL/min — AB (ref >60)
GFR, EST NON AFRICAN AMERICAN: 8 mL/min — AB (ref >60)
Glucose, Bld: 104 mg/dL — ABNORMAL HIGH (ref 65–99)
Potassium: 4 mmol/L (ref 3.5–5.1)
SODIUM: 139 mmol/L (ref 135–145)

## 2017-01-15 LAB — CBC
HCT: 25.9 % — ABNORMAL LOW (ref 40.0–52.0)
HEMOGLOBIN: 8.4 g/dL — AB (ref 13.0–18.0)
MCH: 30.7 pg (ref 26.0–34.0)
MCHC: 32.5 g/dL (ref 32.0–36.0)
MCV: 94.4 fL (ref 80.0–100.0)
PLATELETS: 153 10*3/uL (ref 150–440)
RBC: 2.75 MIL/uL — AB (ref 4.40–5.90)
RDW: 18.3 % — ABNORMAL HIGH (ref 11.5–14.5)
WBC: 7.9 10*3/uL (ref 3.8–10.6)

## 2017-01-15 SURGERY — DIALYSIS/PERMA CATHETER INSERTION
Anesthesia: Moderate Sedation

## 2017-01-15 MED ORDER — MIDAZOLAM HCL 2 MG/2ML IJ SOLN
INTRAMUSCULAR | Status: AC
  Filled 2017-01-15: qty 2

## 2017-01-15 MED ORDER — FENTANYL CITRATE (PF) 100 MCG/2ML IJ SOLN
INTRAMUSCULAR | Status: AC
  Filled 2017-01-15: qty 2

## 2017-01-15 MED ORDER — SODIUM CHLORIDE 0.9 % IV SOLN
INTRAVENOUS | Status: DC
  Administered 2017-01-15: 08:00:00 via INTRAVENOUS

## 2017-01-15 MED ORDER — LIDOCAINE-EPINEPHRINE (PF) 1 %-1:200000 IJ SOLN
INTRAMUSCULAR | Status: DC | PRN
  Administered 2017-01-15: 18 mL via INTRADERMAL

## 2017-01-15 MED ORDER — FENTANYL CITRATE (PF) 100 MCG/2ML IJ SOLN
INTRAMUSCULAR | Status: DC | PRN
  Administered 2017-01-15: 50 ug via INTRAVENOUS

## 2017-01-15 MED ORDER — MIDAZOLAM HCL 2 MG/2ML IJ SOLN
INTRAMUSCULAR | Status: DC | PRN
  Administered 2017-01-15: 2 mg via INTRAVENOUS

## 2017-01-15 MED ORDER — OMEGA-3-ACID ETHYL ESTERS 1 G PO CAPS
1.0000 g | ORAL_CAPSULE | Freq: Two times a day (BID) | ORAL | Status: DC
  Administered 2017-01-16: 1 g via ORAL
  Filled 2017-01-15 (×2): qty 1

## 2017-01-15 MED ORDER — HEPARIN SODIUM (PORCINE) 10000 UNIT/ML IJ SOLN
INTRAMUSCULAR | Status: AC
  Filled 2017-01-15: qty 1

## 2017-01-15 SURGICAL SUPPLY — 10 items
ADH SKN CLS APL DERMABOND .7 (GAUZE/BANDAGES/DRESSINGS) ×1
CATH PALINDROME RT-P 15FX19CM (CATHETERS) ×2 IMPLANT
DERMABOND ADVANCED (GAUZE/BANDAGES/DRESSINGS) ×2
DERMABOND ADVANCED .7 DNX12 (GAUZE/BANDAGES/DRESSINGS) IMPLANT
NDL ENTRY 21GA 7CM ECHOTIP (NEEDLE) IMPLANT
NEEDLE ENTRY 21GA 7CM ECHOTIP (NEEDLE) ×3 IMPLANT
PACK ANGIOGRAPHY (CUSTOM PROCEDURE TRAY) ×2 IMPLANT
SET INTRO CAPELLA COAXIAL (SET/KITS/TRAYS/PACK) ×2 IMPLANT
SUT MNCRL AB 4-0 PS2 18 (SUTURE) ×2 IMPLANT
SUT SILK 0 FSL (SUTURE) ×3 IMPLANT

## 2017-01-15 NOTE — Progress Notes (Signed)
This note also relates to the following rows which could not be included: Resp - Cannot attach notes to unvalidated device data  Hd completed

## 2017-01-15 NOTE — Care Management (Addendum)
Informed that patient can start at [facility] on Monday 11/19 6:30 A His schedule will be MWF 6:30a

## 2017-01-15 NOTE — Progress Notes (Signed)
HD STARTED  

## 2017-01-15 NOTE — Progress Notes (Signed)
POST DIALYSIS ASSESSMENT 

## 2017-01-15 NOTE — Op Note (Signed)
OPERATIVE NOTE   PROCEDURE: 1. Insertion of tunneled dialysis catheter right IJ approach with ultrasound and fluoroscopic guidance.  PRE-OPERATIVE DIAGNOSIS: End-stage renal disease now requiring hemodialysis  POST-OPERATIVE DIAGNOSIS: Same  SURGEON: Hortencia Pilar.  ANESTHESIA: Conscious sedation was administered under my direct supervision by the interventional radiology RN. IV Versed plus fentanyl were utilized. Continuous ECG, pulse oximetry and blood pressure was monitored throughout the entire procedure. Conscious sedation was for a total of 39 minutes.  ESTIMATED BLOOD LOSS: Minimal cc  CONTRAST USED:  None  FLUOROSCOPY TIME: 2.5 minutes  INDICATIONS:   Fernando Burgher Mooreis a 46 y.o. y.o. male who presents with end-stage renal disease.  He has been dialyzed via a femoral temporary dialysis catheter and now requires tunneled catheter insertion in preparation for discharge.  I  DESCRIPTION: After obtaining full informed written consent, the patient was positioned supine. The right neck and chest wall was prepped and draped in a sterile fashion. Ultrasound was placed in a sterile sleeve. Ultrasound was utilized to identify the right internal jugular vein which is noted to be echolucent and compressible indicating patency. Image is recorded for the permanent record. Under direct ultrasound visualization a micro-needle is inserted into the vein followed by the micro-wire. Micro-sheath was then advanced and a J wire is inserted without difficulty under fluoroscopic guidance. Small counterincision was made at the wire insertion site. Dilators are passed over the wire and the tunneled dialysis catheter is fed into the central venous system without difficulty.  Under fluoroscopy the catheter tip positioned at the atrial caval junction. The catheter is then approximated to the chest wall and an exit site selected. 1% lidocaine is infiltrated in soft tissues at this level small incision is made and  the tunneling device is then passed from the exit site to the neck counterincision. Catheter is then connected to the tunneling device and the catheter was pulled subcutaneously. It is then transected and the hub assembly connected without difficulty. Both lumens aspirate and flush easily. After verification of smooth contour with proper tip position under fluoroscopy the catheter is packed with 5000 units of heparin per lumen.  Catheter secured to the skin of the right chest wall with 0 silk. A sterile dressing is applied with a Biopatch.  COMPLICATIONS: None  CONDITION: Good  Hortencia Pilar [facility]. Office:  [phone]   01/15/2017,9:21 AM

## 2017-01-15 NOTE — Progress Notes (Signed)
Patient ID: Fernando Salas, male   DOB: April 20, 1970, 46 y.o.   MRN: [id number]  [facility] PROGRESS NOTE  NAITHAN DELAGE GYI:[id number] DOB: February 09, 1971 DOA: 01/12/2017 PCP: [name], MD  HPI/Subjective: Permacath placement today, doing well, for dialysis and tomorrow.  Wants a different PCP, told me that he is having trouble getting his BP medicines from [facility]. Objective: Vitals:   01/15/17 0930 01/15/17 0945  BP: (!) 166/106 (!) 164/85  Pulse: 68 (!) 59  Resp: 17 17  Temp:    SpO2: 100% 96%    Filed Weights   01/13/17 1055 01/14/17 1250 01/15/17 0735  Weight: 87.3 kg (192 lb 7.4 oz) 87.3 kg (192 lb 7.4 oz) 87.1 kg (192 lb)    ROS: Review of Systems  Constitutional: Negative for chills and fever.  Eyes: Negative for blurred vision.  Respiratory: Negative for cough and shortness of breath.   Cardiovascular: Negative for chest pain.  Gastrointestinal: Negative for abdominal pain, constipation, diarrhea, nausea and vomiting.  Genitourinary: Negative for dysuria.  Musculoskeletal: Negative for joint pain.  Neurological: Negative for dizziness and headaches.   Exam: Physical Exam  Constitutional: He is oriented to person, place, and time.  HENT:  Nose: No mucosal edema.  Mouth/Throat: No oropharyngeal exudate or posterior oropharyngeal edema.  Eyes: Conjunctivae, EOM and lids are normal. Pupils are equal, round, and reactive to light.  Neck: No JVD present. Carotid bruit is not present. No edema present. No thyroid mass and no thyromegaly present.  Cardiovascular: S1 normal and S2 normal. Exam reveals no gallop.  No murmur heard. Pulses:      Dorsalis pedis pulses are 2+ on the right side, and 2+ on the left side.  Respiratory: No respiratory distress. He has no decreased breath sounds. He has no wheezes. He has no rhonchi. He has no rales.  GI: Soft. Bowel sounds are normal. There is no tenderness.  Musculoskeletal:       Right ankle: He exhibits no swelling.      Left ankle: He exhibits no swelling.  Lymphadenopathy:    He has no cervical adenopathy.  Neurological: He is alert and oriented to person, place, and time. No cranial nerve deficit.  Skin: Skin is warm. No rash noted. Nails show no clubbing.  Psychiatric: He has a normal mood and affect.      Data Reviewed: Basic Metabolic Panel: Recent Labs  Lab 01/12/17 0706 01/12/17 1518 01/12/17 2242 01/13/17 0555 01/13/17 1300 01/15/17 0435  NA 138  --  139 139  --  139  K 5.1  --  3.7 3.9  --  4.0  CL 111  --  108 109  --  104  CO2 15*  --  19* 20*  --  26  GLUCOSE 112*  --  118* 98  --  104*  BUN 117*  --  86* 92*  --  51*  CREATININE 13.52*  --  10.48* 10.82*  --  7.35*  CALCIUM 8.5*  --  7.9* 7.7*  --  7.7*  MG  --   --  2.0  --  1.8  --   PHOS  --  6.0* 4.5  --   --   --    Liver Function Tests: Recent Labs  Lab 01/12/17 0706  AST 23  ALT 26  ALKPHOS 66  BILITOT 1.0  PROT 7.2  ALBUMIN 3.5   Recent Labs  Lab 01/12/17 0706  LIPASE 36   CBC: Recent Labs  Lab 01/12/17 0706 01/13/17 0555 01/15/17 0435  WBC 6.5 7.1 7.9  NEUTROABS 4.4  --   --   HGB 9.9* 8.1* 8.4*  HCT 30.2* 25.2* 25.9*  MCV 94.1 92.2 94.4  PLT 197 165 153   Cardiac Enzymes: Recent Labs  Lab 01/12/17 0706 01/14/17 0600 01/14/17 0941 01/14/17 1535  TROPONINI 0.10* 0.10* 0.10* 0.09*   BNP (last 3 results) Recent Labs    11/01/16 0448  BNP 2,699.0*      Recent Results (from the past 240 hour(s))  MRSA PCR Screening     Status: None   Collection Time: 01/12/17 11:22 AM  Result Value Ref Range Status   MRSA by PCR NEGATIVE NEGATIVE Final    Comment:        The GeneXpert MRSA Assay (FDA approved for NASAL specimens only), is one component of a comprehensive MRSA colonization surveillance program. It is not intended to diagnose MRSA infection nor to guide or monitor treatment for MRSA infections.      Studies: No results found.  Scheduled Meds: . amLODipine  10 mg  Oral Daily  . aspirin EC  81 mg Oral Daily  . atorvastatin  40 mg Oral Daily  . carvedilol  25 mg Oral BID  . clopidogrel  75 mg Oral Daily  . doxercalciferol  1 mcg Intravenous Once  . ferrous gluconate  324 mg Oral Q breakfast  . fluticasone  1 spray Each Nare Daily  . heparin  5,000 Units Subcutaneous Q8H  . Influenza vac split quadrivalent PF  0.5 mL Intramuscular Tomorrow-1000  . isosorbide-hydrALAZINE  1 tablet Oral TID  . losartan  100 mg Oral QHS  . multivitamin  1 tablet Oral QHS  . pneumococcal 23 valent vaccine  0.5 mL Intramuscular Tomorrow-1000   Continuous Infusions: . sodium chloride 10 mL/hr at 01/15/17 0741    Assessment/Plan:  1. Accelerated hypertension.  Patient now off Cardene drip and restarted on oral medications.  Continue Coreg, losartan,  BiDil, BP better today. 2.  ESRD, status post permacath placement today, hemodialysis today and tomorrow, outpatient dialysis.  Patient wants Monday, Wednesday, Friday. 3.  Acute congestive heart failure and fluid overload.  Will order echocardiogram.. 4. History of CAD on aspirin, Plavix and Lipitor and restarted on Coreg 5. Tobacco abuse.  Refused nicotine patch 6. Marijuana abuse 7 patient is asking case managers help for medication assistance. Code Status:     Code Status Orders  (From admission, onward)        Start     Ordered   01/12/17 0954  Full code  Continuous     01/12/17 0954    Code Status History    Date Active Date Inactive Code Status Order ID Comments User Context   11/01/2016 03:29 11/02/2016 15:25 Full Code [id number]  Hugelmeyer, Ubaldo Glassing, DO Inpatient     Family Communication: Permission to speak in front of family at bedside Disposition Plan:  Consultants:  Critical care specialist  Nephrology  Vascular surgery consulted for PermCath  Procedures:  Permacath in right chest  Time spent:30  Edd Arbour

## 2017-01-15 NOTE — Progress Notes (Addendum)
   01/15/17 1200  PPD Results  Does patient have an induration at the injection site? No (Read by Lurlean Horns RN)   Will be set up with [facility] in [place] on M W F schedule.  Specific time is not confirmed. Discussed that patient is sitting for his treatments

## 2017-01-15 NOTE — Progress Notes (Signed)
[place], [place] 01/15/17  Subjective:   Dialysis went well yesterday.  No cramping or shortness of breath Appetite is good No nausea or vomiting   Objective:  Vital signs in last 24 hours:  Temp:  [97.5 F (36.4 C)-99 F (37.2 C)] 99 F (37.2 C) (11/16 1325) Pulse Rate:  [59-70] 63 (11/16 1400) Resp:  [11-24] 16 (11/16 1400) BP: (154-192)/(76-111) 158/89 (11/16 1400) SpO2:  [95 %-100 %] 97 % (11/16 1400) Weight:  [87.1 kg (192 lb)] 87.1 kg (192 lb) (11/16 0735)  Weight change: 0 kg (0 lb) Filed Weights   01/13/17 1055 01/14/17 1250 01/15/17 0735  Weight: 87.3 kg (192 lb 7.4 oz) 87.3 kg (192 lb 7.4 oz) 87.1 kg (192 lb)    Intake/Output:    Intake/Output Summary (Last 24 hours) at 01/15/2017 1444 Last data filed at 01/14/2017 2100 Gross per 24 hour  Intake 120 ml  Output 400 ml  Net -280 ml     Physical Exam: General:  Chronically ill-appearing,  HEENT  muddy sclerae, moist oral mucous membranes  Neck  supple  Pulm/lungs  normal breathing effort, clear to auscultation  CVS/Heart  regular rhythm, no rub   Abdomen:   soft, nontender, nondistended  Extremities:  no edema   Neurologic:  Alert and oriented  Skin:  Dry skin   Access:  Right IJ PermCath placed by Dr. Delana Meyer 01/15/17       Basic Metabolic Panel:  Recent Labs  Lab 01/12/17 0706 01/12/17 1518 01/12/17 2242 01/13/17 0555 01/13/17 1300 01/15/17 0435  NA 138  --  139 139  --  139  K 5.1  --  3.7 3.9  --  4.0  CL 111  --  108 109  --  104  CO2 15*  --  19* 20*  --  26  GLUCOSE 112*  --  118* 98  --  104*  BUN 117*  --  86* 92*  --  51*  CREATININE 13.52*  --  10.48* 10.82*  --  7.35*  CALCIUM 8.5*  --  7.9* 7.7*  --  7.7*  MG  --   --  2.0  --  1.8  --   PHOS  --  6.0* 4.5  --   --   --      CBC: Recent Labs  Lab 01/12/17 0706 01/13/17 0555 01/15/17 0435  WBC 6.5 7.1 7.9  NEUTROABS 4.4  --   --   HGB 9.9* 8.1* 8.4*  HCT 30.2* 25.2* 25.9*  MCV  94.1 92.2 94.4  PLT 197 165 153      Lab Results  Component Value Date   HEPBSAG Negative 01/12/2017   HEPBSAB Non Reactive 01/12/2017      Microbiology:  Recent Results (from the past 240 hour(s))  MRSA PCR Screening     Status: None   Collection Time: 01/12/17 11:22 AM  Result Value Ref Range Status   MRSA by PCR NEGATIVE NEGATIVE Final    Comment:        The GeneXpert MRSA Assay (FDA approved for NASAL specimens only), is one component of a comprehensive MRSA colonization surveillance program. It is not intended to diagnose MRSA infection nor to guide or monitor treatment for MRSA infections.     Coagulation Studies: No results for input(s): LABPROT, INR in the last 72 hours.  Urinalysis: No results for input(s): COLORURINE, LABSPEC, PHURINE, GLUCOSEU, HGBUR, BILIRUBINUR, KETONESUR, PROTEINUR, UROBILINOGEN, NITRITE, LEUKOCYTESUR in the last 72 hours.  Invalid input(s): APPERANCEUR    Imaging: No results found.   Medications:   . sodium chloride 10 mL/hr at 01/15/17 0741   . amLODipine  10 mg Oral Daily  . aspirin EC  81 mg Oral Daily  . atorvastatin  40 mg Oral Daily  . carvedilol  25 mg Oral BID  . clopidogrel  75 mg Oral Daily  . doxercalciferol  1 mcg Intravenous Once  . ferrous gluconate  324 mg Oral Q breakfast  . fluticasone  1 spray Each Nare Daily  . heparin  5,000 Units Subcutaneous Q8H  . Influenza vac split quadrivalent PF  0.5 mL Intramuscular Tomorrow-1000  . isosorbide-hydrALAZINE  1 tablet Oral TID  . losartan  100 mg Oral QHS  . multivitamin  1 tablet Oral QHS  . pneumococcal 23 valent vaccine  0.5 mL Intramuscular Tomorrow-1000   acetaminophen **OR** [DISCONTINUED] acetaminophen, heparin, hydrALAZINE, nitroGLYCERIN  Assessment/ Plan:  46 y.o. 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  End-stage renal disease.  Patient has had long-standing chronic kidney disease which is presumed to be secondary to hypertension.  He has strong family history of end-stage renal disease.  His brother and his uncle were on dialysis First hemodialysis treatment November 13 Outpatient discharge planning is in progress for [place] unit.  Outpatient orders have been prepared PermCath placed today; 3K/2.5 calcium dialysate today  No ultrafiltration May discontinue femoral temporary dialysis catheter.  Patient was reminded about the care of tunneled dialysis catheter.  No shower.  He will follow-up with vascular surgery as outpatient for access placement.  2.  Accelerated hypertension Oral regimen has been started. Monitor response Blood pressure control has improved  3. Anemia -Iron deficiency.    EPO supplementation during dialysis  4.  Secondary hyperparathyroidism with low calcium Iv hectorol  5.  Patient refusing Pneumovax and flu vaccine          LOS: 3 [facility] 11/16/20182:44 PM  [facility] [place], [place]

## 2017-01-15 NOTE — Care Management (Signed)
Patient is having dialysis initiated during this stay. Has had perma cath inserted but still has a temporary cath in his groin. Fernando Salas with [facility] [employer] is coordinating. Obtaining PPD results. Patient has requested information on Meals on Wheels.  Informed patient that he is not eligible for this service .  Did provide brochure on this resource.  Results for WATT, GEILER (MRN [id number]) as of 01/15/2017 12:07  Ref. Range 01/12/2017 15:18  Hepatitis B Surface Ag Latest Ref Range: Negative  Negative  Hep B S Ab Unknown Non Reactive  Hep B Core Ab, Tot Latest Ref Range: Negative  Negative  ed.

## 2017-01-15 NOTE — H&P (Signed)
[facility] History & Physical Update  The patient was interviewed and re-examined.  The patient's previous History and Physical has been reviewed and is unchanged.  There is no change in the plan of care. We plan to proceed with the scheduled procedure.  Hortencia Pilar, MD  01/15/2017, 7:15 AM

## 2017-01-15 NOTE — Consult Note (Signed)
[facility] Vascular Consult Note  MRN : [id number]  Fernando Salas is a 46 y.o. (October 16, 1970) male who presents with chief complaint of  Chief Complaint  Patient presents with  . Emesis  . Diarrhea  . Flank Pain  . Shortness of Breath  .  History of Present Illness: I am asked to evaluate the patient by Dr. Candiss Norse and the order is approved by Dr. Rosita Fire.  Patient is a 46 year old gentleman who presented to [facility] on January 12, 2017 with increasing shortness of breath nausea vomiting and diarrhea.  He has a known history of coronary artery disease as well as chronic kidney disease.  He also noted increased swelling of his lower extremities as well as a 60 pound weight loss over the past 12 months.  He was also complaining of increased fatigue.  Patient was admitted to the intensive care unit with the diagnosis of accelerated hypertension as well as acute on chronic renal failure.  Admitting GFR was noted to be 4.  He was also noted to be in acute congestive heart failure.  As his condition did not improve with medical therapy Dr. Stoney Bang placed a temporary dialysis catheter on January 12, 2017 and he was initiated on hemodialysis.  He has done well with his dialysis with improvement of both his hypertension uremia and volume overload.  Renal function however has not improved and I am asked to evaluate for dialysis access.  Current Facility-Administered Medications  Medication Dose Route Frequency Provider Last Rate Last Dose  . acetaminophen (TYLENOL) tablet 650 mg  650 mg Oral Q6H PRN Loletha Grayer, MD   650 mg at 01/13/17 1334  . amLODipine (NORVASC) tablet 10 mg  10 mg Oral Daily Wilhelmina Mcardle, MD   10 mg at 01/14/17 1658  . aspirin EC tablet 81 mg  81 mg Oral Daily Loletha Grayer, MD   81 mg at 01/14/17 1658  . atorvastatin (LIPITOR) tablet 40 mg  40 mg Oral Daily Loletha Grayer, MD   40 mg at 01/14/17 1659  . carvedilol  (COREG) tablet 25 mg  25 mg Oral BID Wilhelmina Mcardle, MD   25 mg at 01/14/17 2155  . ceFAZolin (ANCEF) 2 g in dextrose 5 % 100 mL IVPB  2 g Intravenous On Call to [place], [place], MD      . clopidogrel (PLAVIX) tablet 75 mg  75 mg Oral Daily Loletha Grayer, MD   75 mg at 01/14/17 1658  . doxercalciferol (HECTOROL) injection 1 mcg  1 mcg Intravenous Once Murlean Iba, MD      . ferrous gluconate (FERGON) tablet 324 mg  324 mg Oral Q breakfast Candiss Norse, [place], MD      . fluticasone (FLONASE) 50 MCG/ACT nasal spray 1 spray  1 spray Each Nare Daily Wieting, Richard, MD      . heparin 1000 unit/ml injection 1,000 Units  1,000 Units Intravenous PRN Dorene Sorrow S, NP   1,000 Units at 01/12/17 1510  . heparin injection 5,000 Units  5,000 Units Subcutaneous Q8H Wieting, Richard, MD   5,000 Units at 01/15/17 5462  . hydrALAZINE (APRESOLINE) injection 10-40 mg  10-40 mg Intravenous Q4H PRN Wilhelmina Mcardle, MD      . Influenza vac split quadrivalent PF (FLUARIX) injection 0.5 mL  0.5 mL Intramuscular Tomorrow-1000 Tukov, Magadalene S, NP      . isosorbide-hydrALAZINE (BIDIL) 20-37.5 MG per tablet 1 tablet  1 tablet Oral TID Merton Border  B, MD   1 tablet at 01/14/17 2154  . losartan (COZAAR) tablet 100 mg  100 mg Oral QHS Murlean Iba, MD   100 mg at 01/14/17 2154  . multivitamin (RENA-VIT) tablet 1 tablet  1 tablet Oral QHS Murlean Iba, MD   1 tablet at 01/14/17 2154  . nitroGLYCERIN (NITROSTAT) SL tablet 0.4 mg  0.4 mg Sublingual Q5 min PRN Loletha Grayer, MD   0.4 mg at 01/14/17 0355  . pneumococcal 23 valent vaccine (PNU-IMMUNE) injection 0.5 mL  0.5 mL Intramuscular Tomorrow-1000 Mikael Spray, NP        Past Medical History:  Diagnosis Date  . CAD (coronary artery disease)    status post multiple myocardial infarctions  . Chronic kidney disease   . COPD (chronic obstructive pulmonary disease) ([place])   . Hyperlipidemia   . Hypertension   . Myocardial infarction ([place])     " I had 13 heart attacks, 8 massive"  . Proteinuria   . Renal insufficiency   . Secondary hyperparathyroidism of renal origin ([place])   . Shortness of breath dyspnea    " when I drink a cup of cold water too fast"  . Sleep apnea    pt stated " long story" does not wear CPAP  . Stroke [facility])     Past Surgical History:  Procedure Laterality Date  . AV FISTULA PLACEMENT Left 01-24-14   By Dr. Hortencia Pilar in [place]  . AV FISTULA PLACEMENT Right 04/30/2014   Procedure: RIGHT BRACHIOCEPHALIC ARTERIOVENOUS (AV) FISTULA CREATION;  Surgeon: Elam Dutch, MD;  Location: [facility] OR;  Service: Vascular;  Laterality: Right;  . AV FISTULA PLACEMENT Left 05/31/2014   Procedure: Left INSERTION OF ARTERIOVENOUS (AV) GORE-TEX GRAFT ARM;  Surgeon: Elam Dutch, MD;  Location: [place];  Service: Vascular;  Laterality: Left;  . CARDIAC CATHETERIZATION    . CORONARY STENT PLACEMENT     13 stents total    Social History Social History   Tobacco Use  . Smoking status: Current Every Day Smoker    Packs/day: 0.50    Years: 25.00    Pack years: 12.50    Types: Cigarettes  . Smokeless tobacco: Never Used  Substance Use Topics  . Alcohol use: No    Alcohol/week: 0.0 oz    Frequency: Never    Comment: occasional  . Drug use: Yes    Types: Marijuana    Comment: daily    Family History Family History  Problem Relation Age of Onset  . Heart disease Mother   . Hyperlipidemia Mother   . Hypertension Mother   . Kidney disease Father   . Kidney disease Brother   . Hyperlipidemia Sister   . Hyperlipidemia Daughter   . Hypertension Sister   . Hypertension Daughter   No family history of bleeding/clotting disorders, porphyria or autoimmune disease   Allergies  Allergen Reactions  . Ibuprofen Other (See Comments)    Kidney   . Viagra [Sildenafil]     Lips swell   . Clonidine Derivatives Rash     REVIEW OF SYSTEMS (Negative unless checked)  Constitutional: [] Weight loss  [] Fever   [] Chills Cardiac: [] Chest pain   [] Chest pressure   [] Palpitations   [] Shortness of breath when laying flat   [] Shortness of breath at rest   [] Shortness of breath with exertion. Vascular:  [] Pain in legs with walking   [] Pain in legs at rest   [] Pain in legs when laying flat   [] Claudication   []   Pain in feet when walking  [] Pain in feet at rest  [] Pain in feet when laying flat   [] History of DVT   [] Phlebitis   [] Swelling in legs   [] Varicose veins   [] Non-healing ulcers Pulmonary:   [] Uses home oxygen   [] Productive cough   [] Hemoptysis   [] Wheeze  [] COPD   [] Asthma Neurologic:  [] Dizziness  [] Blackouts   [] Seizures   [] History of stroke   [] History of TIA  [] Aphasia   [] Temporary blindness   [] Dysphagia   [] Weakness or numbness in arms   [] Weakness or numbness in legs Musculoskeletal:  [] Arthritis   [] Joint swelling   [] Joint pain   [] Low back pain Hematologic:  [] Easy bruising  [] Easy bleeding   [] Hypercoagulable state   [] Anemic  [] Hepatitis Gastrointestinal:  [] Blood in stool   [] Vomiting blood  [] Gastroesophageal reflux/heartburn   [] Difficulty swallowing. Genitourinary:  [] Chronic kidney disease   [] Difficult urination  [] Frequent urination  [] Burning with urination   [] Blood in urine Skin:  [] Rashes   [] Ulcers   [] Wounds Psychological:  [] History of anxiety   []  History of major depression.  Physical Examination  Vitals:   01/14/17 1545 01/14/17 1756 01/14/17 1941 01/15/17 0407  BP: (!) 192/107 (!) 155/78 (!) 177/95 (!) 159/84  Pulse: 67 70 70 66  Resp: 19 18 18 16   Temp:  (!) 97.5 F (36.4 C) (!) 97.5 F (36.4 C) 98.6 F (37 C)  TempSrc:  Oral Oral   SpO2:  98% 99% 97%  Weight:      Height:       Body mass index is 26.1 kg/m. Gen:  WD/WN, NAD, patient is sitting up in a chair watching TV Head: [place]/AT, No temporalis wasting. Prominent temp pulse not noted. Ear/Nose/Throat: Hearing grossly intact, nares w/o erythema or drainage, oropharynx w/o Erythema/Exudate Eyes: Sclera  non-icteric, conjunctiva clear Neck: Trachea midline.  No JVD.  Pulmonary:  Good air movement, respirations not labored, equal bilaterally.  Cardiac: RRR, normal S1, S2. Vascular: Neck and chest wall are clean dry and intact.  There are multiple previous access placements in the upper arms the most recent of which is a left brachial axillary dialysis graft.  All are thrombosed.  Temporary dialysis catheter is placed in the right groin Vessel Right Left  Radial Palpable Palpable  Ulnar Palpable Palpable  Brachial Palpable Palpable  Gastrointestinal: soft, non-tender/non-distended. No guarding/reflex.  Musculoskeletal: M/S 5/5 throughout.  Extremities without ischemic changes.  No deformity or atrophy. No edema. Neurologic: Sensation grossly intact in extremities.  Symmetrical.  Speech is fluent. Motor exam as listed above. Psychiatric: Judgment intact, Mood & affect appropriate for pt's clinical situation. Dermatologic: No rashes or ulcers noted.  No cellulitis or open wounds. Lymph : No Cervical, Axillary, or Inguinal lymphadenopathy.      CBC Lab Results  Component Value Date   WBC 7.9 01/15/2017   HGB 8.4 (L) 01/15/2017   HCT 25.9 (L) 01/15/2017   MCV 94.4 01/15/2017   PLT 153 01/15/2017    BMET    Component Value Date/Time   NA 139 01/15/2017 0435   NA 139 01/17/2014 1139   K 4.0 01/15/2017 0435   K 4.7 01/17/2014 1139   CL 104 01/15/2017 0435   CL 111 (H) 01/17/2014 1139   CO2 26 01/15/2017 0435   CO2 25 01/17/2014 1139   GLUCOSE 104 (H) 01/15/2017 0435   GLUCOSE 92 01/17/2014 1139   BUN 51 (H) 01/15/2017 0435   BUN 35 (H) 01/17/2014 1139  CREATININE 7.35 (H) 01/15/2017 0435   CREATININE 3.84 (H) 01/17/2014 1139   CALCIUM 7.7 (L) 01/15/2017 0435   CALCIUM 8.6 01/17/2014 1139   GFRNONAA 8 (L) 01/15/2017 0435   GFRNONAA 18 (L) 01/17/2014 1139   GFRNONAA 21 (L) 03/28/2012 0409   GFRAA 9 (L) 01/15/2017 0435   GFRAA 22 (L) 01/17/2014 1139   GFRAA 24 (L)  03/28/2012 0409   Estimated Creatinine Clearance: 13.8 mL/min (A) (by C-G formula based on SCr of 7.35 mg/dL (H)).  COAG Lab Results  Component Value Date   INR 1.35 11/01/2016   INR 1.20 10/31/2016   INR 1.1 01/17/2014    Radiology Dg Chest 2 View  Result Date: 01/12/2017 CLINICAL DATA:  Shortness of breath and productive cough for 1 week EXAM: CHEST  2 VIEW COMPARISON:  10/31/2016 FINDINGS: Cardiac shadow is enlarged but stable. The lungs are well aerated bilaterally. Increased central vascular congestion is noted as well as some patchy atelectatic changes. Minimal blunting of the costophrenic angles is noted. No acute bony abnormality is seen. IMPRESSION: Increased vascular congestion with bilateral patchy atelectatic changes. Electronically Signed   By: Inez Catalina M.D.   On: 01/12/2017 07:48   Ct Renal Stone Study  Result Date: 01/12/2017 CLINICAL DATA:  46 year old male on dialysis presenting with nausea, vomiting and diarrhea for the past week accompanying bite right flank pain. Shortness breath and cough. Initial encounter. EXAM: CT ABDOMEN AND PELVIS WITHOUT CONTRAST TECHNIQUE: Multidetector CT imaging of the abdomen and pelvis was performed following the standard protocol without IV contrast. COMPARISON:  12/10/2014 CT.  01/12/2017 chest x-ray. FINDINGS: Lower chest: Bilateral pleural effusions greater right with fluid extending into the fissures suggestive of pulmonary edema. Cardiomegaly. Trace right coronary artery calcifications. Hepatobiliary: Taking into account limitation by non contrast imaging, no hepatic mass. Small gallstones. Limited for evaluating for gallbladder inflammation given third spacing of fluid. Pancreas: Questionable 9 mm hypodensity within the posterior aspect of the the body of the pancreas (series 2, images 25 and 26). Spleen: Taking into account limitation by non contrast imaging, no splenic lesion or enlargement. Adrenals/Urinary Tract: No obstructing  stone or hydronephrosis. Scattered low-density structures largest 8 mm on the right consistent with cyst. Some of the small lesions too small to characterize although statistically likely cysts. No adrenal mass. Noncontrast filled views of the urinary bladder without mass identified. Stomach/Bowel: Evaluation of bowel is limited by lack of contrast, fat planes and third spacing of fluid. The appendix is gas filled without surrounding inflammation. Vascular/Lymphatic: Atherosclerotic changes aorta with ectasia without focal aneurysm. Ectatic aortic branch vessels including celiac and superior mesenteric artery. Dilated common iliac arteries measuring up to 1.7 cm bilaterally. Scattered normal/ top-normal size lymph nodes possibly reactive in origin. Reproductive: No gross abnormality Other: No free intraperitoneal air.  Third spacing of fluid. Musculoskeletal: Bony overgrowth left pubic region may be related to remote trauma. No acute osseous abnormality. IMPRESSION: Cardiomegaly with bilateral pleural effusion and fluid within fissures suggestive of pulmonary edema. No obstructing stone or hydronephrosis. Evaluation of bowel is limited by lack of contrast, fat planes and third spacing of fluid. The appendix is gas filled without surrounding inflammation. Small gallstones. Limited for evaluating for gallbladder inflammation given third spacing of fluid. Questionable 9 mm hypodensity within the posterior aspect of the the body of the pancreas (series 2, images 25 and 26). Aortic Atherosclerosis (ICD10-I70.0). Ectatic aortic branch vessels including celiac and superior mesenteric artery. Dilated common iliac arteries measuring up to 1.7 cm bilaterally.  Scattered normal/ top-normal size lymph nodes possibly reactive in origin. Electronically Signed   By: Genia Del M.D.   On: 01/12/2017 08:17      Assessment/Plan 1.  End-stage renal disease requiring hemodialysis:  Patient will continue dialysis therapy without  further interruption and therefore a tunneled catheter will be placed. Dialysis has already been arranged. 2.  Complication dialysis device with thrombosis AV access:  Patient's left arm dialysis access is thrombosed. The patient will undergo attempted salvage and thrombectomy using interventional techniques in the future as an outpatient. Potassium will be drawn to ensure that it is an appropriate level prior to performing thrombectomy. 3.  Hypertension:  Patient will continue medical management; nephrology is following no changes in oral medications. 4.  COPD:  Continue pulmonary medications and aerosols as already ordered, these medications have been reviewed and there are no changes at this time. 5.  Coronary artery disease:  EKG will be monitored. Nitrates will be used if needed. The patient's oral cardiac medications will be continued.  Hortencia Pilar, MD  01/15/2017 7:02 AM    This note was created with Dragon medical transcription system.  Any error is purely unintentional

## 2017-01-15 NOTE — Progress Notes (Signed)
PRE DIALYSIS ASSESSMENT 

## 2017-01-15 NOTE — Progress Notes (Signed)
HD system with signs of clotting.Patient rinsedback and new setup connected.Treatment resumed with no complications.patient tolerated well.

## 2017-01-15 NOTE — Care Management Important Message (Signed)
Important Message  Patient Details  Name: Fernando Salas MRN: [id number] Date of Birth: 10/04/1970   Medicare Important Message Given:  Yes Signed IM notice given    Katrina Stack, RN 01/15/2017, 1:23 PM

## 2017-01-16 MED ORDER — FERROUS GLUCONATE 324 (38 FE) MG PO TABS: 324.0000 mg | ORAL_TABLET | Freq: Every day | ORAL | 0 refills | Status: DC

## 2017-01-16 NOTE — Progress Notes (Signed)
     Fernando Salas was admitted to the Hospital on 01/12/2017 and Discharged  01/16/2017 and his  Significant other should be excused from work/school .for 5 days starting 01/12/2017   Epifanio Lesches M.D on 01/16/2017,at 10:43 AM

## 2017-01-16 NOTE — Care Management Note (Signed)
Case Management Note  Patient Details  Name: Fernando Salas MRN: [id number] Date of Birth: [birth year]  Subjective/Objective:    [name] was provided with a GoodRx discount coupon for Ferrous Gluconate.                 Action/Plan:   Expected Discharge Date:  01/16/17               Expected Discharge Plan:     In-House Referral:     Discharge planning Services     Post Acute Care Choice:    Choice offered to:     DME Arranged:    DME Agency:     HH Arranged:    HH Agency:     Status of Service:     If discussed at [employer], dates discussed:    Additional Comments:  Kennley Schwandt A, RN 01/16/2017, 11:27 AM

## 2017-01-16 NOTE — Progress Notes (Signed)
[place], [place] 01/16/17  Subjective:   Dialysis went well yesterday.  No cramping or shortness of breath Appetite is good No nausea or vomiting   Objective:  Vital signs in last 24 hours:  Temp:  [97.6 F (36.4 C)-99 F (37.2 C)] 97.6 F (36.4 C) (11/17 0446) Pulse Rate:  [62-72] 68 (11/17 0446) Resp:  [11-26] 17 (11/17 0446) BP: (151-187)/(76-106) 164/99 (11/17 0446) SpO2:  [96 %-99 %] 99 % (11/17 0446)  Weight change: -0.21 kg (-7.4 oz) Filed Weights   01/13/17 1055 01/14/17 1250 01/15/17 0735  Weight: 87.3 kg (192 lb 7.4 oz) 87.3 kg (192 lb 7.4 oz) 87.1 kg (192 lb)    Intake/Output:    Intake/Output Summary (Last 24 hours) at 01/16/2017 1054 Last data filed at 01/15/2017 1843 Gross per 24 hour  Intake 240 ml  Output 0 ml  Net 240 ml     Physical Exam: General:  Chronically ill-appearing,  HEENT  muddy sclerae, moist oral mucous membranes  Neck  supple  Pulm/lungs  normal breathing effort, clear to auscultation  CVS/Heart  regular rhythm, no rub   Abdomen:   soft, nontender, nondistended  Extremities:  no edema   Neurologic:  Alert and oriented  Skin:  Dry skin   Access:  Right IJ PermCath placed by Dr. Delana Meyer 01/15/17       Basic Metabolic Panel:  Recent Labs  Lab 01/12/17 0706 01/12/17 1518 01/12/17 2242 01/13/17 0555 01/13/17 1300 01/15/17 0435  NA 138  --  139 139  --  139  K 5.1  --  3.7 3.9  --  4.0  CL 111  --  108 109  --  104  CO2 15*  --  19* 20*  --  26  GLUCOSE 112*  --  118* 98  --  104*  BUN 117*  --  86* 92*  --  51*  CREATININE 13.52*  --  10.48* 10.82*  --  7.35*  CALCIUM 8.5*  --  7.9* 7.7*  --  7.7*  MG  --   --  2.0  --  1.8  --   PHOS  --  6.0* 4.5  --   --   --      CBC: Recent Labs  Lab 01/12/17 0706 01/13/17 0555 01/15/17 0435  WBC 6.5 7.1 7.9  NEUTROABS 4.4  --   --   HGB 9.9* 8.1* 8.4*  HCT 30.2* 25.2* 25.9*  MCV 94.1 92.2 94.4  PLT 197 165 153      Lab Results   Component Value Date   HEPBSAG Negative 01/12/2017   HEPBSAB Non Reactive 01/12/2017      Microbiology:  Recent Results (from the past 240 hour(s))  MRSA PCR Screening     Status: None   Collection Time: 01/12/17 11:22 AM  Result Value Ref Range Status   MRSA by PCR NEGATIVE NEGATIVE Final    Comment:        The GeneXpert MRSA Assay (FDA approved for NASAL specimens only), is one component of a comprehensive MRSA colonization surveillance program. It is not intended to diagnose MRSA infection nor to guide or monitor treatment for MRSA infections.     Coagulation Studies: No results for input(s): LABPROT, INR in the last 72 hours.  Urinalysis: No results for input(s): COLORURINE, LABSPEC, PHURINE, GLUCOSEU, HGBUR, BILIRUBINUR, KETONESUR, PROTEINUR, UROBILINOGEN, NITRITE, LEUKOCYTESUR in the last 72 hours.  Invalid input(s): APPERANCEUR    Imaging: No results found.  Medications:   . sodium chloride 10 mL/hr at 01/15/17 0741   . amLODipine  10 mg Oral Daily  . aspirin EC  81 mg Oral Daily  . atorvastatin  40 mg Oral Daily  . carvedilol  25 mg Oral BID  . clopidogrel  75 mg Oral Daily  . doxercalciferol  1 mcg Intravenous Once  . ferrous gluconate  324 mg Oral Q breakfast  . fluticasone  1 spray Each Nare Daily  . heparin  5,000 Units Subcutaneous Q8H  . Influenza vac split quadrivalent PF  0.5 mL Intramuscular Tomorrow-1000  . isosorbide-hydrALAZINE  1 tablet Oral TID  . losartan  100 mg Oral QHS  . multivitamin  1 tablet Oral QHS  . omega-3 acid ethyl esters  1 g Oral BID  . pneumococcal 23 valent vaccine  0.5 mL Intramuscular Tomorrow-1000   acetaminophen **OR** [DISCONTINUED] acetaminophen, heparin, hydrALAZINE, nitroGLYCERIN  Assessment/ Plan:  46 y.o. 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  End-stage renal disease.  Patient has had long-standing chronic kidney disease which is presumed to be secondary to hypertension.  He has strong family history of end-stage renal disease.  His brother and his uncle were on dialysis First hemodialysis treatment November 13 Outpatient discharge planning is in progress for [facility] unit.  Outpatient orders have been prepared PermCath in place; 3K/2.5 calcium dialysate   No ultrafiltration He will follow-up with vascular surgery as outpatient for access placement.  2.  Accelerated hypertension Oral regimen has been started. Monitor response Blood pressure control has improved  3. Anemia -Iron deficiency.    EPO supplementation during dialysis  4.  Secondary hyperparathyroidism with low calcium Iv hectorol           LOS: 4 Syrianna Schillaci 11/17/201810:54 AM  [facility] [place], [place]

## 2017-01-16 NOTE — Progress Notes (Signed)
Discharge home today with no rebound no pain on Monday for dialysis, he is on scheduled Monday, Wednesday, Friday at 6:30 AM. Temporary dialysis catheter removal.  Continue amlodipine 10 mg daily, aspirin 81 mg daily, Coreg 25 mg p.o. twice daily, Plavix 75 mg p.o. Daily,bidil 20/37.5 mg p.o.tid.d/w dr.Singh. Time spent;30 min

## 2017-01-16 NOTE — Progress Notes (Signed)
Pt to be discharged today. periph iv and tele removed. Rt femoral temp dialysis cath removed. Pressure held for 20 minutes. Groin dressed with gauze and tape. Pt ambulated and tolerated it well. Coupon for pharmacy [facility]) given to pt. disch's via w.c. Accompanied by s.o.

## 2017-01-16 NOTE — Progress Notes (Signed)
     Fernando Salas was admitted to the Hospital on 01/12/2017 and Discharged  01/16/2017 and his wife  should be excused from work/school .for 5 days starting 01/12/2017   Epifanio Lesches M.D on 01/16/2017,at 10:41 AM

## 2017-01-20 NOTE — Discharge Summary (Signed)
Fernando Salas, is a 46 y.o. male  DOB 07-Apr-1970  MRN [id number].  Admission date:  01/12/2017  Admitting Physician  Loletha Grayer, MD  Discharge Date:  01/16/2017   Primary MD  [name], MD  Recommendations for primary care physician for things to follow:  Follow with PCP in one week Out pt HD on Monday,wednesday and Friday   Admission Diagnosis  Gastroenteritis [K52.9] Troponin I above reference range [R74.8] AKI (acute kidney injury) ([place]) [N17.9] Hypertension, unspecified type [I10]   Discharge Diagnosis  Gastroenteritis [K52.9] Troponin I above reference range [R74.8] AKI (acute kidney injury) ([place]) [N17.9] Hypertension, unspecified type [I10]   Active Problems:   Accelerated hypertension   AKI (acute kidney injury) ([place])      Past Medical History:  Diagnosis Date  . CAD (coronary artery disease)    status post multiple myocardial infarctions  . Chronic kidney disease   . COPD (chronic obstructive pulmonary disease) ([place])   . Hyperlipidemia   . Hypertension   . Myocardial infarction ([place])    " I had 13 heart attacks, 8 massive"  . Proteinuria   . Renal insufficiency   . Secondary hyperparathyroidism of renal origin ([place])   . Shortness of breath dyspnea    " when I drink a cup of cold water too fast"  . Sleep apnea    pt stated " long story" does not wear CPAP  . Stroke [facility])     Past Surgical History:  Procedure Laterality Date  . AV FISTULA PLACEMENT Left 01-24-14   By Dr. Hortencia Pilar in [place]  . AV FISTULA PLACEMENT Right 04/30/2014   Procedure: RIGHT BRACHIOCEPHALIC ARTERIOVENOUS (AV) FISTULA CREATION;  Surgeon: Elam Dutch, MD;  Location: [facility] OR;  Service: Vascular;  Laterality: Right;  . AV FISTULA PLACEMENT Left 05/31/2014   Procedure: Left INSERTION OF  ARTERIOVENOUS (AV) GORE-TEX GRAFT ARM;  Surgeon: Elam Dutch, MD;  Location: [place];  Service: Vascular;  Laterality: Left;  . CARDIAC CATHETERIZATION    . CORONARY STENT PLACEMENT     13 stents total  . DIALYSIS/PERMA CATHETER INSERTION N/A 01/15/2017   Procedure: DIALYSIS/PERMA CATHETER INSERTION;  Surgeon: Katha Cabal, MD;  Location: [facility];  Service: Cardiovascular;  Laterality: N/A;       History of present illness and  Hospital Course:     Kindly see H&P for history of present illness and admission details, please review complete Labs, Consult reports and Test reports for all details in brief  HPI  from the history and physical done on the day of admission 46 year old male patient admitted to intensive care unit because of shortness of breath, weight gain, found to have elevated blood pressure up to 211 the level of 117 patient is admitted to intensive care unit for hypertensive emergency associated with shortness of breath, worsening kidney disease.   Hospital Course  1. Accelerated hypertension.  Patient initially admitted to intensive care unit, started on Cardene drip, improved with Cardene drip, with the table of the Cardene drip. consent obtained from Dr. Gilberto Better,  Patient off Cardene drip and restarted on oral medications.  Continued Coreg, losartan,  BiDil, 2. Acute congestive heart failure, in the context of worsening kidney disease.   3. Uremia with worsening kidney function.  Acute kidney injury on chronic kidney disease.  Now progressed to end-stage renal disease.  Dialysis done patient initially received dialysis through temporary catheter, later on cath placed by vascular physician.  Patient  received hemodialysis, will arrange outpatient dialysis for him on Monday, Wednesday, Friday at [facility]. .. . 4. History of CAD on aspirin, Plavix and Lipitor and restarted on Coreg 5. Tobacco abuse.  Refused nicotine patch 6. Marijuana  abuse       Discharge Condition: stable   Follow UP  Follow-up Information    [name], MD Follow up in 1 week(s).   Specialty:  Internal Medicine Contact information: [place] [address] [phone]             Discharge Instructions  and  Discharge Medications  Follow at [facility] in [facility] on Monday for out pt HD  Discharge Instructions    Discharge instructions   Complete by:  As directed    Follow up at [facility] in [place]  On Monday,Wednesday,Friday     Allergies as of 01/16/2017      Reactions   Ibuprofen Other (See Comments)   Kidney    Viagra [sildenafil]    Lips swell    Clonidine Derivatives Rash      Medication List    STOP taking these medications   furosemide 80 MG tablet Commonly known as:  LASIX     TAKE these medications   amLODipine 10 MG tablet Commonly known as:  NORVASC Take 10 mg by mouth daily.   aspirin 81 MG EC tablet Take 1 tablet (81 mg total) by mouth daily.   atorvastatin 40 MG tablet Commonly known as:  LIPITOR Take 1 tablet (40 mg total) by mouth daily.   carvedilol 25 MG tablet Commonly known as:  COREG Take 25 mg by mouth 2 (two) times daily.   clopidogrel 75 MG tablet Commonly known as:  PLAVIX Take 1 tablet (75 mg total) by mouth daily.   ferrous gluconate 324 MG tablet Commonly known as:  FERGON Take 1 tablet (324 mg total) daily with breakfast by mouth.   fluticasone 50 MCG/ACT nasal spray Commonly known as:  FLONASE INSTILL 1 SPRAY PER NARES DAILY   isosorbide-hydrALAZINE 20-37.5 MG tablet Commonly known as:  BIDIL Take 1 tablet by mouth 3 (three) times daily.   multivitamin tablet Take 1 tablet daily by mouth.   nitroGLYCERIN 0.4 MG SL tablet Commonly known as:  NITROSTAT Place 0.4 mg under the tongue every 5 (five) minutes as needed for chest pain.         Diet and Activity recommendation: See Discharge Instructions above   Consults obtained -  vascular,Nephrology   Major procedures and Radiology Reports - PLEASE review detailed and final reports for all details, in brief -     Dg Chest 2 View  Result Date: 01/12/2017 CLINICAL DATA:  Shortness of breath and productive cough for 1 week EXAM: CHEST  2 VIEW COMPARISON:  10/31/2016 FINDINGS: Cardiac shadow is enlarged but stable. The lungs are well aerated bilaterally. Increased central vascular congestion is noted as well as some patchy atelectatic changes. Minimal blunting of the costophrenic angles is noted. No acute bony abnormality is seen. IMPRESSION: Increased vascular congestion with bilateral patchy atelectatic changes. Electronically Signed   By: Inez Catalina M.D.   On: 01/12/2017 07:48   Ct Renal Stone Study  Result Date: 01/12/2017 CLINICAL DATA:  46 year old male on dialysis presenting with nausea, vomiting and diarrhea for the past week accompanying bite right flank pain. Shortness breath and cough. Initial encounter. EXAM: CT ABDOMEN AND PELVIS WITHOUT CONTRAST TECHNIQUE: Multidetector CT imaging of the abdomen and pelvis was performed following the  standard protocol without IV contrast. COMPARISON:  12/10/2014 CT.  01/12/2017 chest x-ray. FINDINGS: Lower chest: Bilateral pleural effusions greater right with fluid extending into the fissures suggestive of pulmonary edema. Cardiomegaly. Trace right coronary artery calcifications. Hepatobiliary: Taking into account limitation by non contrast imaging, no hepatic mass. Small gallstones. Limited for evaluating for gallbladder inflammation given third spacing of fluid. Pancreas: Questionable 9 mm hypodensity within the posterior aspect of the the body of the pancreas (series 2, images 25 and 26). Spleen: Taking into account limitation by non contrast imaging, no splenic lesion or enlargement. Adrenals/Urinary Tract: No obstructing stone or hydronephrosis. Scattered low-density structures largest 8 mm on the right consistent with cyst.  Some of the small lesions too small to characterize although statistically likely cysts. No adrenal mass. Noncontrast filled views of the urinary bladder without mass identified. Stomach/Bowel: Evaluation of bowel is limited by lack of contrast, fat planes and third spacing of fluid. The appendix is gas filled without surrounding inflammation. Vascular/Lymphatic: Atherosclerotic changes aorta with ectasia without focal aneurysm. Ectatic aortic branch vessels including celiac and superior mesenteric artery. Dilated common iliac arteries measuring up to 1.7 cm bilaterally. Scattered normal/ top-normal size lymph nodes possibly reactive in origin. Reproductive: No gross abnormality Other: No free intraperitoneal air.  Third spacing of fluid. Musculoskeletal: Bony overgrowth left pubic region may be related to remote trauma. No acute osseous abnormality. IMPRESSION: Cardiomegaly with bilateral pleural effusion and fluid within fissures suggestive of pulmonary edema. No obstructing stone or hydronephrosis. Evaluation of bowel is limited by lack of contrast, fat planes and third spacing of fluid. The appendix is gas filled without surrounding inflammation. Small gallstones. Limited for evaluating for gallbladder inflammation given third spacing of fluid. Questionable 9 mm hypodensity within the posterior aspect of the the body of the pancreas (series 2, images 25 and 26). Aortic Atherosclerosis (ICD10-I70.0). Ectatic aortic branch vessels including celiac and superior mesenteric artery. Dilated common iliac arteries measuring up to 1.7 cm bilaterally. Scattered normal/ top-normal size lymph nodes possibly reactive in origin. Electronically Signed   By: Genia Del M.D.   On: 01/12/2017 08:17    Micro Results    Recent Results (from the past 240 hour(s))  MRSA PCR Screening     Status: None   Collection Time: 01/12/17 11:22 AM  Result Value Ref Range Status   MRSA by PCR NEGATIVE NEGATIVE Final    Comment:         The GeneXpert MRSA Assay (FDA approved for NASAL specimens only), is one component of a comprehensive MRSA colonization surveillance program. It is not intended to diagnose MRSA infection nor to guide or monitor treatment for MRSA infections.        Today   Subjective:   Fernando Salas today has no headache,no chest abdominal pain,no new weakness tingling or numbness, feels much better wants to go home today.   Objective:   Blood pressure (!) 164/99, pulse 68, temperature 97.6 F (36.4 C), temperature source Oral, resp. rate 17, height 6' (1.829 m), weight 87.1 kg (192 lb), SpO2 99 %.  No intake or output data in the 24 hours ending 01/20/17 0841  Exam Awake Alert, Oriented x 3, No new F.N deficits, Normal affect [place].AT,PERRAL Supple Neck,No JVD, No cervical lymphadenopathy appriciated.  Symmetrical Chest wall movement, Good air movement bilaterally, CTAB RRR,No Gallops,Rubs or new Murmurs, No Parasternal Heave +ve B.Sounds, Abd Soft, Non tender, No organomegaly appriciated, No rebound -guarding or rigidity. No Cyanosis, Clubbing or edema, No new Rash  or bruise  Data Review   CBC w Diff:  Lab Results  Component Value Date   WBC 7.9 01/15/2017   HGB 8.4 (L) 01/15/2017   HGB 13.3 01/17/2014   HCT 25.9 (L) 01/15/2017   HCT 40.0 01/17/2014   PLT 153 01/15/2017   PLT 303 01/17/2014   LYMPHOPCT 20 01/12/2017   LYMPHOPCT 33.3 03/25/2012   MONOPCT 10 01/12/2017   MONOPCT 14.5 03/25/2012   EOSPCT 1 01/12/2017   EOSPCT 1.9 03/25/2012   BASOPCT 1 01/12/2017   BASOPCT 0.7 03/25/2012    CMP:  Lab Results  Component Value Date   NA 139 01/15/2017   NA 139 01/17/2014   K 4.0 01/15/2017   K 4.7 01/17/2014   CL 104 01/15/2017   CL 111 (H) 01/17/2014   CO2 26 01/15/2017   CO2 25 01/17/2014   BUN 51 (H) 01/15/2017   BUN 35 (H) 01/17/2014   CREATININE 7.35 (H) 01/15/2017   CREATININE 3.84 (H) 01/17/2014   PROT 7.2 01/12/2017   PROT 8.2 03/24/2012   ALBUMIN 3.5  01/12/2017   ALBUMIN 3.8 03/24/2012   BILITOT 1.0 01/12/2017   BILITOT 0.2 03/24/2012   ALKPHOS 66 01/12/2017   ALKPHOS 82 03/24/2012   AST 23 01/12/2017   AST 21 03/24/2012   ALT 26 01/12/2017   ALT 19 03/24/2012  .   Total Time in preparing paper work, data evaluation and todays exam - 35 minutes  Epifanio Lesches M.D on 01/16/2017 at 8:41 AM    Note: This dictation was prepared with Dragon dictation along with smaller phrase technology. Any transcriptional errors that result from this process are unintentional.

## 2017-02-08 ENCOUNTER — Emergency Department: Payer: Medicare Other

## 2017-02-08 ENCOUNTER — Encounter: Payer: Self-pay | Admitting: Emergency Medicine

## 2017-02-08 ENCOUNTER — Observation Stay
Admission: EM | Admit: 2017-02-08 | Discharge: 2017-02-12 | Disposition: A | Discharge status: Home / Self Care | Payer: Medicare Other | Attending: Internal Medicine | Admitting: Internal Medicine

## 2017-02-08 ENCOUNTER — Other Ambulatory Visit: Payer: Self-pay

## 2017-02-08 DIAGNOSIS — Z9114 Patient's other noncompliance with medication regimen: Secondary | ICD-10-CM | POA: Insufficient documentation

## 2017-02-08 DIAGNOSIS — E785 Hyperlipidemia, unspecified: Secondary | ICD-10-CM | POA: Insufficient documentation

## 2017-02-08 DIAGNOSIS — Z9115 Patient's noncompliance with renal dialysis: Secondary | ICD-10-CM | POA: Insufficient documentation

## 2017-02-08 DIAGNOSIS — I10 Essential (primary) hypertension: Secondary | ICD-10-CM

## 2017-02-08 DIAGNOSIS — N2581 Secondary hyperparathyroidism of renal origin: Secondary | ICD-10-CM | POA: Diagnosis not present

## 2017-02-08 DIAGNOSIS — Z7902 Long term (current) use of antithrombotics/antiplatelets: Secondary | ICD-10-CM | POA: Insufficient documentation

## 2017-02-08 DIAGNOSIS — G4730 Sleep apnea, unspecified: Secondary | ICD-10-CM | POA: Diagnosis not present

## 2017-02-08 DIAGNOSIS — Z888 Allergy status to other drugs, medicaments and biological substances status: Secondary | ICD-10-CM | POA: Insufficient documentation

## 2017-02-08 DIAGNOSIS — Z72811 Adult antisocial behavior: Secondary | ICD-10-CM | POA: Insufficient documentation

## 2017-02-08 DIAGNOSIS — I132 Hypertensive heart and chronic kidney disease with heart failure and with stage 5 chronic kidney disease, or end stage renal disease: Principal | ICD-10-CM | POA: Insufficient documentation

## 2017-02-08 DIAGNOSIS — Z7982 Long term (current) use of aspirin: Secondary | ICD-10-CM | POA: Insufficient documentation

## 2017-02-08 DIAGNOSIS — I5023 Acute on chronic systolic (congestive) heart failure: Secondary | ICD-10-CM | POA: Diagnosis not present

## 2017-02-08 DIAGNOSIS — R0603 Acute respiratory distress: Secondary | ICD-10-CM | POA: Diagnosis not present

## 2017-02-08 DIAGNOSIS — F17210 Nicotine dependence, cigarettes, uncomplicated: Secondary | ICD-10-CM | POA: Diagnosis not present

## 2017-02-08 DIAGNOSIS — J449 Chronic obstructive pulmonary disease, unspecified: Secondary | ICD-10-CM | POA: Diagnosis not present

## 2017-02-08 DIAGNOSIS — I2510 Atherosclerotic heart disease of native coronary artery without angina pectoris: Secondary | ICD-10-CM | POA: Diagnosis not present

## 2017-02-08 DIAGNOSIS — I252 Old myocardial infarction: Secondary | ICD-10-CM | POA: Insufficient documentation

## 2017-02-08 DIAGNOSIS — E8770 Fluid overload, unspecified: Secondary | ICD-10-CM | POA: Diagnosis present

## 2017-02-08 DIAGNOSIS — Z992 Dependence on renal dialysis: Secondary | ICD-10-CM | POA: Insufficient documentation

## 2017-02-08 DIAGNOSIS — N186 End stage renal disease: Secondary | ICD-10-CM | POA: Insufficient documentation

## 2017-02-08 DIAGNOSIS — Z8673 Personal history of transient ischemic attack (TIA), and cerebral infarction without residual deficits: Secondary | ICD-10-CM | POA: Insufficient documentation

## 2017-02-08 LAB — CBC WITH DIFFERENTIAL/PLATELET
BASOS ABS: 0.1 10*3/uL (ref 0–0.1)
BASOS PCT: 1 %
EOS PCT: 0 %
Eosinophils Absolute: 0 10*3/uL (ref 0–0.7)
HEMATOCRIT: 34 % — AB (ref 40.0–52.0)
Hemoglobin: 10.8 g/dL — ABNORMAL LOW (ref 13.0–18.0)
Lymphocytes Relative: 13 %
Lymphs Abs: 1.1 10*3/uL (ref 1.0–3.6)
MCH: 31.1 pg (ref 26.0–34.0)
MCHC: 31.9 g/dL — AB (ref 32.0–36.0)
MCV: 97.5 fL (ref 80.0–100.0)
MONO ABS: 0.8 10*3/uL (ref 0.2–1.0)
Monocytes Relative: 9 %
Neutro Abs: 6.2 10*3/uL (ref 1.4–6.5)
Neutrophils Relative %: 77 %
Platelets: 235 10*3/uL (ref 150–440)
RBC: 3.49 MIL/uL — ABNORMAL LOW (ref 4.40–5.90)
RDW: 17.2 % — AB (ref 11.5–14.5)
WBC: 8.1 10*3/uL (ref 3.8–10.6)

## 2017-02-08 LAB — BASIC METABOLIC PANEL
ANION GAP: 17 — AB (ref 5–15)
BUN: 75 mg/dL — ABNORMAL HIGH (ref 6–20)
CALCIUM: 8.4 mg/dL — AB (ref 8.9–10.3)
CO2: 15 mmol/L — AB (ref 22–32)
Chloride: 108 mmol/L (ref 101–111)
Creatinine, Ser: 11.02 mg/dL — ABNORMAL HIGH (ref 0.61–1.24)
GFR, EST AFRICAN AMERICAN: 6 mL/min — AB (ref >60)
GFR, EST NON AFRICAN AMERICAN: 5 mL/min — AB (ref >60)
GLUCOSE: 106 mg/dL — AB (ref 65–99)
Potassium: 5.1 mmol/L (ref 3.5–5.1)
Sodium: 140 mmol/L (ref 135–145)

## 2017-02-08 LAB — BRAIN NATRIURETIC PEPTIDE: B Natriuretic Peptide: >4500 pg/mL — ABNORMAL HIGH (ref 0.0–100.0)

## 2017-02-08 LAB — TROPONIN I: Troponin I: 0.09 ng/mL (ref <0.03)

## 2017-02-08 MED ORDER — SODIUM CHLORIDE 0.9% FLUSH
3.0000 mL | Freq: Two times a day (BID) | INTRAVENOUS | Status: DC
  Administered 2017-02-09 – 2017-02-11 (×2): 3 mL via INTRAVENOUS

## 2017-02-08 MED ORDER — HYDROCODONE-ACETAMINOPHEN 5-325 MG PO TABS
1.0000 | ORAL_TABLET | ORAL | Status: DC | PRN
  Filled 2017-02-08: qty 2

## 2017-02-08 MED ORDER — HEPARIN SODIUM (PORCINE) 5000 UNIT/ML IJ SOLN
5000.0000 [IU] | Freq: Three times a day (TID) | INTRAMUSCULAR | Status: DC
  Administered 2017-02-09 (×2): 5000 [IU] via SUBCUTANEOUS
  Filled 2017-02-08 (×5): qty 1

## 2017-02-08 MED ORDER — POLYETHYLENE GLYCOL 3350 17 G PO PACK: 17.0000 g | PACK | Freq: Every day | ORAL | Status: DC | PRN

## 2017-02-08 MED ORDER — AMLODIPINE BESYLATE 10 MG PO TABS
10.0000 mg | ORAL_TABLET | Freq: Every day | ORAL | Status: DC
  Administered 2017-02-08 – 2017-02-12 (×5): 10 mg via ORAL
  Filled 2017-02-08 (×5): qty 1

## 2017-02-08 MED ORDER — IPRATROPIUM-ALBUTEROL 0.5-2.5 (3) MG/3ML IN SOLN: 3.0000 mL | RESPIRATORY_TRACT | Status: DC | PRN

## 2017-02-08 MED ORDER — ACETAMINOPHEN 325 MG PO TABS: 650.0000 mg | ORAL_TABLET | Freq: Four times a day (QID) | ORAL | Status: DC | PRN

## 2017-02-08 MED ORDER — HYDRALAZINE HCL 20 MG/ML IJ SOLN
20.0000 mg | INTRAMUSCULAR | Status: DC | PRN
  Administered 2017-02-09 – 2017-02-10 (×3): 20 mg via INTRAVENOUS
  Filled 2017-02-08 (×4): qty 1

## 2017-02-08 MED ORDER — HYDROMORPHONE HCL 1 MG/ML IJ SOLN
1.0000 mg | Freq: Once | INTRAMUSCULAR | Status: AC
  Administered 2017-02-08: 1 mg via INTRAVENOUS
  Filled 2017-02-08: qty 1

## 2017-02-08 MED ORDER — SODIUM CHLORIDE 0.9 % IV SOLN: 250.0000 mL | INTRAVENOUS | Status: DC | PRN

## 2017-02-08 MED ORDER — ONDANSETRON HCL 4 MG PO TABS: 4.0000 mg | ORAL_TABLET | Freq: Four times a day (QID) | ORAL | Status: DC | PRN

## 2017-02-08 MED ORDER — ONDANSETRON HCL 4 MG/2ML IJ SOLN
4.0000 mg | Freq: Four times a day (QID) | INTRAMUSCULAR | Status: DC | PRN
  Administered 2017-02-09: 4 mg via INTRAVENOUS
  Filled 2017-02-08: qty 2

## 2017-02-08 MED ORDER — SODIUM CHLORIDE 0.9% FLUSH: 3.0000 mL | INTRAVENOUS | Status: DC | PRN

## 2017-02-08 MED ORDER — SODIUM CHLORIDE 0.9% FLUSH
3.0000 mL | Freq: Two times a day (BID) | INTRAVENOUS | Status: DC
  Administered 2017-02-08 – 2017-02-11 (×5): 3 mL via INTRAVENOUS

## 2017-02-08 MED ORDER — ACETAMINOPHEN 650 MG RE SUPP: 650.0000 mg | Freq: Four times a day (QID) | RECTAL | Status: DC | PRN

## 2017-02-08 MED ORDER — LABETALOL HCL 5 MG/ML IV SOLN
20.0000 mg | Freq: Once | INTRAVENOUS | Status: AC
  Administered 2017-02-08: 20 mg via INTRAVENOUS
  Filled 2017-02-08: qty 4

## 2017-02-08 NOTE — H&P (Signed)
[facility] NAME: Fernando Salas    MR#:  [id number]  DATE OF BIRTH:  September 12, 1970  DATE OF ADMISSION:  02/08/2017  PRIMARY CARE PHYSICIAN: [name], MD   REQUESTING/REFERRING PHYSICIAN:   CHIEF COMPLAINT:   Chief Complaint  Patient presents with  . Shortness of Breath  . Flank Pain    HISTORY OF PRESENT ILLNESS: Fernando Salas  is a 46 y.o. male with a known history per below, recently started on hemodialysis within the last 3 weeks, unable to have hemodialysis done today due to facility closure, presents emergency room with shortness of breath, dyspnea on exertion, uncontrolled blood pressure in the emergency room, creatinine 11, the chest x-ray noted for edema, patient no apparent distress on evaluation, patient is now being admitted with acute fluid overload secondary to end-stage renal disease and uncontrolled hypertension.  PAST MEDICAL HISTORY:   Past Medical History:  Diagnosis Date  . CAD (coronary artery disease)    status post multiple myocardial infarctions  . Chronic kidney disease   . COPD (chronic obstructive pulmonary disease) ([place])   . Hyperlipidemia   . Hypertension   . Myocardial infarction ([place])    " I had 13 heart attacks, 8 massive"  . Proteinuria   . Renal insufficiency   . Secondary hyperparathyroidism of renal origin ([place])   . Shortness of breath dyspnea    " when I drink a cup of cold water too fast"  . Sleep apnea    pt stated " long story" does not wear CPAP  . Stroke [facility])     PAST SURGICAL HISTORY:  Past Surgical History:  Procedure Laterality Date  . AV FISTULA PLACEMENT Left 01-24-14   By Dr. Hortencia Pilar in [place]  . AV FISTULA PLACEMENT Right 04/30/2014   Procedure: RIGHT BRACHIOCEPHALIC ARTERIOVENOUS (AV) FISTULA CREATION;  Surgeon: Elam Dutch, MD;  Location: [facility] OR;  Service: Vascular;  Laterality: Right;  . AV FISTULA PLACEMENT Left 05/31/2014   Procedure: Left  INSERTION OF ARTERIOVENOUS (AV) GORE-TEX GRAFT ARM;  Surgeon: Elam Dutch, MD;  Location: [facility];  Service: Vascular;  Laterality: Left;  . CARDIAC CATHETERIZATION    . CORONARY STENT PLACEMENT     13 stents total  . DIALYSIS/PERMA CATHETER INSERTION N/A 01/15/2017   Procedure: DIALYSIS/PERMA CATHETER INSERTION;  Surgeon: Katha Cabal, MD;  Location: [place] [facility];  Service: Cardiovascular;  Laterality: N/A;    SOCIAL HISTORY:  Social History   Tobacco Use  . Smoking status: Current Every Day Smoker    Packs/day: 0.25    Years: 25.00    Pack years: 6.25    Types: Cigarettes  . Smokeless tobacco: Never Used  Substance Use Topics  . Alcohol use: No    Alcohol/week: 0.0 oz    Frequency: Never    Comment: occasional    FAMILY HISTORY:  Family History  Problem Relation Age of Onset  . Heart disease Mother   . Hyperlipidemia Mother   . Hypertension Mother   . Kidney disease Father   . Kidney disease Brother   . Hyperlipidemia Sister   . Hyperlipidemia Daughter   . Hypertension Sister   . Hypertension Daughter     DRUG ALLERGIES:  Allergies  Allergen Reactions  . Ibuprofen Other (See Comments)    Kidney   . Viagra [Sildenafil]     Lips swell   . Clonidine Derivatives Rash    REVIEW OF SYSTEMS:  CONSTITUTIONAL: No fever, +fatigue/weakness.  EYES: No blurred or double vision.  EARS, NOSE, AND THROAT: No tinnitus or ear pain.  RESPIRATORY: No cough, shortness of breath, wheezing or hemoptysis.  CARDIOVASCULAR: No chest pain, orthopnea, edema.  GASTROINTESTINAL: No nausea, vomiting, diarrhea or abdominal pain.  GENITOURINARY: No dysuria, hematuria.  ENDOCRINE: No polyuria, nocturia,  HEMATOLOGY: No anemia, easy bruising or bleeding SKIN: No rash or lesion. MUSCULOSKELETAL: No joint pain or arthritis.   NEUROLOGIC: No tingling, numbness, weakness.  PSYCHIATRY: No anxiety or depression.   MEDICATIONS AT HOME:  Prior to Admission medications    Medication Sig Start Date End Date Taking? Authorizing Provider  amLODipine (NORVASC) 10 MG tablet Take 10 mg by mouth daily.    [provider]  aspirin EC 81 MG EC tablet Take 1 tablet (81 mg total) by mouth daily. 11/03/16   Demetrios Loll, MD  atorvastatin (LIPITOR) 40 MG tablet Take 1 tablet (40 mg total) by mouth daily. 11/03/16   Demetrios Loll, MD  carvedilol (COREG) 25 MG tablet Take 25 mg by mouth 2 (two) times daily. 02/19/14   [provider]  clopidogrel (PLAVIX) 75 MG tablet Take 1 tablet (75 mg total) by mouth daily. 11/03/16   Demetrios Loll, MD  ferrous gluconate (FERGON) 324 MG tablet Take 1 tablet (324 mg total) daily with breakfast by mouth. 01/17/17   Epifanio Lesches, MD  fluticasone (FLONASE) 50 MCG/ACT nasal spray INSTILL 1 SPRAY PER NARES DAILY 01/14/16   [provider]  isosorbide-hydrALAZINE (BIDIL) 20-37.5 MG tablet Take 1 tablet by mouth 3 (three) times daily. 11/02/16   Demetrios Loll, MD  Multiple Vitamin (MULTIVITAMIN) tablet Take 1 tablet daily by mouth.    [provider]  nitroGLYCERIN (NITROSTAT) 0.4 MG SL tablet Place 0.4 mg under the tongue every 5 (five) minutes as needed for chest pain.    [provider]      PHYSICAL EXAMINATION:   VITAL SIGNS: Blood pressure (!) 184/126, pulse 71, temperature 98.6 F (37 C), temperature source Oral, resp. rate (!) 23, height 6' (1.829 m), weight 90.7 kg (200 lb), SpO2 95 %.  GENERAL:  46 y.o.-year-old patient lying in the bed with no acute distress.  EYES: Pupils equal, round, reactive to light and accommodation. No scleral icterus. Extraocular muscles intact.  HEENT: Head atraumatic, normocephalic. Oropharynx and nasopharynx clear.  NECK:  Supple, no jugular venous distention. No thyroid enlargement, no tenderness.  LUNGS: Diminished breath sounds at bases bilaterally with mild Rales. No use of accessory muscles of respiration.  CARDIOVASCULAR: S1, S2 normal. No murmurs, rubs, or gallops.   ABDOMEN: Soft, nontender, nondistended. Bowel sounds present. No organomegaly or mass.  EXTREMITIES: No pedal edema, cyanosis, or clubbing.  NEUROLOGIC: Cranial nerves II through XII are intact. Muscle strength 5/5 in all extremities. Sensation intact. Gait not checked.  PSYCHIATRIC: The patient is alert and oriented x 3.  SKIN: No obvious rash, lesion, or ulcer.   LABORATORY PANEL:   CBC Recent Labs  Lab 02/08/17 2029  WBC 8.1  HGB 10.8*  HCT 34.0*  PLT 235  MCV 97.5  MCH 31.1  MCHC 31.9*  RDW 17.2*  LYMPHSABS 1.1  MONOABS 0.8  EOSABS 0.0  BASOSABS 0.1   ------------------------------------------------------------------------------------------------------------------  Chemistries  Recent Labs  Lab 02/08/17 2029  NA 140  K 5.1  CL 108  CO2 15*  GLUCOSE 106*  BUN 75*  CREATININE 11.02*  CALCIUM 8.4*   ------------------------------------------------------------------------------------------------------------------ estimated creatinine clearance is 9.2 mL/min (A) (by C-G  formula based on SCr of 11.02 mg/dL (H)). ------------------------------------------------------------------------------------------------------------------ No results for input(s): TSH, T4TOTAL, T3FREE, THYROIDAB in the last 72 hours.  Invalid input(s): FREET3   Coagulation profile No results for input(s): INR, PROTIME in the last 168 hours. ------------------------------------------------------------------------------------------------------------------- No results for input(s): DDIMER in the last 72 hours. -------------------------------------------------------------------------------------------------------------------  Cardiac Enzymes Recent Labs  Lab 02/08/17 2029  TROPONINI 0.09*   ------------------------------------------------------------------------------------------------------------------ Invalid input(s):  POCBNP  ---------------------------------------------------------------------------------------------------------------  Urinalysis    Component Value Date/Time   COLORURINE YELLOW (A) 01/12/2017 0848   APPEARANCEUR CLEAR (A) 01/12/2017 0848   APPEARANCEUR Clear 01/17/2014 1139   LABSPEC 1.010 01/12/2017 0848   LABSPEC 1.015 01/17/2014 1139   PHURINE 5.0 01/12/2017 0848   GLUCOSEU 50 (A) 01/12/2017 0848   GLUCOSEU Negative 01/17/2014 1139   HGBUR SMALL (A) 01/12/2017 0848   BILIRUBINUR NEGATIVE 01/12/2017 0848   BILIRUBINUR Negative 01/17/2014 1139   KETONESUR NEGATIVE 01/12/2017 0848   PROTEINUR >=300 (A) 01/12/2017 0848   NITRITE NEGATIVE 01/12/2017 0848   LEUKOCYTESUR NEGATIVE 01/12/2017 0848   LEUKOCYTESUR Negative 01/17/2014 1139     RADIOLOGY: Dg Chest Port 1 View  Result Date: 02/08/2017 CLINICAL DATA:  Shortness of Breath EXAM: PORTABLE CHEST 1 VIEW COMPARISON:  01/12/2017 FINDINGS: Cardiac shadow is enlarged. Dialysis catheter is now seen in satisfactory position. Mild central vascular congestion is noted likely related to a degree of volume overload. No focal infiltrate is seen. No sizable effusion is noted. IMPRESSION: Changes of mild congestive failure. Electronically Signed   By: Inez Catalina M.D.   On: 02/08/2017 20:49    EKG: Orders placed or performed during the hospital encounter of 02/08/17  . EKG 12-Lead  . EKG 12-Lead  . EKG 12-Lead  . EKG 12-Lead  . ED EKG  . ED EKG  . EKG 12-Lead  . EKG 12-Lead    IMPRESSION AND PLAN: 1 acute fluid overload state Secondary to end-stage renal disease Referred to the observation unit, nephrology consult for hemodialysis needs, supplemental oxygen.,  Strict I&O monitoring  2 end-stage renal disease Recently started on hemodialysis within the last 3 weeks Plan of care as stated above  3 chronic benign essential hypertension Uncontrolled secondary to above Complete MAR when available, start Norvasc at bedtime,  as needed hydralazine for systolic blood pressure greater than 160, vitals per routine, make changes as per necessary  4 incomplete MAR Completely unavailable  5 COPD without exacerbation Breathing treatments as needed  Full code Condition stable Prognosis fair DVT prophylaxis with heparin subcu Disposition to home after hemodialysis on tomorrow morning the complications   All the records are reviewed and case discussed with ED provider. Management plans discussed with the patient, family and they are in agreement.  CODE STATUS: Code Status History    Date Active Date Inactive Code Status Order ID Comments User Context   01/12/2017 09:54 01/16/2017 16:03 Full Code [id number]  Loletha Grayer, MD ED   11/01/2016 03:29 11/02/2016 15:25 Full Code [id number]  Hugelmeyer, Ubaldo Glassing, DO Inpatient       TOTAL TIME TAKING CARE OF THIS PATIENT: 80minutes.    Avel Peace Salary M.D on 02/08/2017   Between 7am to 6pm - Pager - [phone]  After 6pm go to [email or web address] - password [email or web address] [facility]  Office  [phone]  CC: Primary care physician; [name], MD   Note: This dictation was prepared with Dragon dictation along with smaller phrase technology. Any transcriptional errors that result from this process are unintentional.

## 2017-02-08 NOTE — ED Provider Notes (Signed)
[facility] Emergency Department Provider Note       Time seen: ----------------------------------------- 8:28 PM on 02/08/2017 -----------------------------------------   I have reviewed the triage vital signs and the nursing notes.  HISTORY   Chief Complaint Shortness of Breath and Flank Pain    HPI Fernando Salas is a 46 y.o. male with a history of coronary artery disease, chronic kidney disease, hypertension, MI on dialysis who presents to the ED for shortness of breath and bilateral flank pain that started last night.  Patient reports he had dialysis this past Friday was supposed to go today but they were closed.  He states orthopnea and dyspnea with exertion.  He was placed on 3 L of nasal cannula oxygen by EMS and he normally does not wear oxygen.  He feels like he is putting on significant amount of fluid.  Past Medical History:  Diagnosis Date  . CAD (coronary artery disease)    status post multiple myocardial infarctions  . Chronic kidney disease   . COPD (chronic obstructive pulmonary disease) ([place])   . Hyperlipidemia   . Hypertension   . Myocardial infarction ([place])    " I had 13 heart attacks, 8 massive"  . Proteinuria   . Renal insufficiency   . Secondary hyperparathyroidism of renal origin ([place])   . Shortness of breath dyspnea    " when I drink a cup of cold water too fast"  . Sleep apnea    pt stated " long story" does not wear CPAP  . Stroke [facility])     Patient Active Problem List   Diagnosis Date Noted  . Accelerated hypertension 01/12/2017  . AKI (acute kidney injury) ([place])   . Unstable angina ([place]) 11/01/2016    Past Surgical History:  Procedure Laterality Date  . AV FISTULA PLACEMENT Left 01-24-14   By Dr. Hortencia Pilar in [place]  . AV FISTULA PLACEMENT Right 04/30/2014   Procedure: RIGHT BRACHIOCEPHALIC ARTERIOVENOUS (AV) FISTULA CREATION;  Surgeon: Elam Dutch, MD;  Location: [facility] OR;  Service: Vascular;   Laterality: Right;  . AV FISTULA PLACEMENT Left 05/31/2014   Procedure: Left INSERTION OF ARTERIOVENOUS (AV) GORE-TEX GRAFT ARM;  Surgeon: Elam Dutch, MD;  Location: [place];  Service: Vascular;  Laterality: Left;  . CARDIAC CATHETERIZATION    . CORONARY STENT PLACEMENT     13 stents total  . DIALYSIS/PERMA CATHETER INSERTION N/A 01/15/2017   Procedure: DIALYSIS/PERMA CATHETER INSERTION;  Surgeon: Katha Cabal, MD;  Location: [place] CV LAB;  Service: Cardiovascular;  Laterality: N/A;    Allergies Ibuprofen; Viagra [sildenafil]; and Clonidine derivatives  Social History Social History   Tobacco Use  . Smoking status: Current Every Day Smoker    Packs/day: 0.50    Years: 25.00    Pack years: 12.50    Types: Cigarettes  . Smokeless tobacco: Never Used  Substance Use Topics  . Alcohol use: No    Alcohol/week: 0.0 oz    Frequency: Never    Comment: occasional  . Drug use: Yes    Types: Marijuana    Comment: daily    Review of Systems Constitutional: Negative for fever. Cardiovascular: Negative for chest pain. Respiratory: Positive for shortness of breath Gastrointestinal: Negative for abdominal pain, vomiting and diarrhea. Musculoskeletal: Negative for back pain.  Positive for edema Skin: Negative for rash. Neurological: Negative for headaches, focal weakness or numbness.  All systems negative/normal/unremarkable except as stated in the HPI  ____________________________________________   PHYSICAL EXAM:  VITAL SIGNS: ED Triage Vitals  Enc Vitals Group     BP      Pulse      Resp      Temp      Temp src      SpO2      Weight      Height      Head Circumference      Peak Flow      Pain Score      Pain Loc      Pain Edu?      Excl. in [place]?     Constitutional: Alert and oriented.  Mild distress Eyes: Conjunctivae are normal. Normal extraocular movements. ENT   Head: Normocephalic and atraumatic.   Nose: No congestion/rhinnorhea.    Mouth/Throat: Mucous membranes are moist.   Neck: No stridor. Cardiovascular: Normal rate, regular rhythm.  Systolic murmur Respiratory: Tachypnea with basilar rales Gastrointestinal: Soft and nontender. Normal bowel sounds Musculoskeletal: Nontender with normal range of motion in extremities.  Edema is noted Neurologic:  Normal speech and language. No gross focal neurologic deficits are appreciated.  Skin:  Skin is warm, dry and intact. No rash noted. Psychiatric: Mood and affect are normal. Speech and behavior are normal.  ____________________________________________  EKG: Interpreted by me.  Sinus rhythm rate of 95 bpm, normal PR interval, PVC, T wave changes are noted, borderline long QT  ____________________________________________  ED COURSE:  Pertinent labs & imaging results that were available during my care of the patient were reviewed by me and considered in my medical decision making (see chart for details). Patient presents for dyspnea and likely pulmonary edema, we will assess with labs and imaging as indicated.   Procedures ____________________________________________   LABS (pertinent positives/negatives)  Labs Reviewed  CBC WITH DIFFERENTIAL/PLATELET  BASIC METABOLIC PANEL  BRAIN NATRIURETIC PEPTIDE  TROPONIN I   CRITICAL CARE Performed by: Earleen Newport   Total critical care time: 30 minutes  Critical care time was exclusive of separately billable procedures and treating other patients.  Critical care was necessary to treat or prevent imminent or life-threatening deterioration.  Critical care was time spent personally by me on the following activities: development of treatment plan with patient and/or surrogate as well as nursing, discussions with consultants, evaluation of patient's response to treatment, examination of patient, obtaining history from patient or surrogate, ordering and performing treatments and interventions, ordering and review of  laboratory studies, ordering and review of radiographic studies, pulse oximetry and re-evaluation of patient's condition.  RADIOLOGY Images were viewed by me  Chest x-ray IMPRESSION: Changes of mild congestive failure. ____________________________________________  DIFFERENTIAL DIAGNOSIS   Pulmonary edema, end-stage renal disease, CHF, pneumonia, PE, pneumothorax, MI  FINAL ASSESSMENT AND PLAN  CHF exacerbation, hypertension, end-stage renal disease on dialysis   Plan: Patient had presented for shortness of breath. Patient's labs are stable reflecting end-stage renal disease requiring dialysis. Patient's imaging did reveal changes of congestive heart failure.  He is very hypertensive here and we will give a dose of IV labetalol.  He also unfortunately he has not had any of his medicines that he was supposed to be on for the past 3 weeks.   Earleen Newport, MD   Note: This note was generated in part or whole with voice recognition software. Voice recognition is usually quite accurate but there are transcription errors that can and very often do occur. I apologize for any typographical errors that were not detected and corrected.     Jimmye Norman,  Algis Liming, MD 02/08/17 2131

## 2017-02-08 NOTE — ED Triage Notes (Signed)
Pt to ED via EMS from home c/o SOB and bilateral flank pain that started last night.  Patient had dialysis this past Friday and was supposed to go today but they were closed.  Patient states orthopnea and dyspnea with exertion.  Patient placed on 3L [place] by EMS, normally doesn't wear oxygen.

## 2017-02-08 NOTE — ED Notes (Signed)
Admitting MD at bedside.

## 2017-02-09 DIAGNOSIS — Z72811 Adult antisocial behavior: Secondary | ICD-10-CM

## 2017-02-09 DIAGNOSIS — I132 Hypertensive heart and chronic kidney disease with heart failure and with stage 5 chronic kidney disease, or end stage renal disease: Secondary | ICD-10-CM | POA: Diagnosis not present

## 2017-02-09 LAB — BASIC METABOLIC PANEL
Anion gap: 14 (ref 5–15)
BUN: 81 mg/dL — ABNORMAL HIGH (ref 6–20)
CALCIUM: 8.4 mg/dL — AB (ref 8.9–10.3)
CHLORIDE: 108 mmol/L (ref 101–111)
CO2: 18 mmol/L — AB (ref 22–32)
CREATININE: 11.57 mg/dL — AB (ref 0.61–1.24)
GFR calc Af Amer: 5 mL/min — ABNORMAL LOW (ref >60)
GFR calc non Af Amer: 5 mL/min — ABNORMAL LOW (ref >60)
GLUCOSE: 111 mg/dL — AB (ref 65–99)
Potassium: 5.7 mmol/L — ABNORMAL HIGH (ref 3.5–5.1)
Sodium: 140 mmol/L (ref 135–145)

## 2017-02-09 LAB — PHOSPHORUS: Phosphorus: 7.8 mg/dL — ABNORMAL HIGH (ref 2.5–4.6)

## 2017-02-09 MED ORDER — ATORVASTATIN CALCIUM 20 MG PO TABS
40.0000 mg | ORAL_TABLET | Freq: Every day | ORAL | Status: DC
  Administered 2017-02-10 – 2017-02-11 (×2): 40 mg via ORAL
  Filled 2017-02-09 (×3): qty 2

## 2017-02-09 MED ORDER — CLOPIDOGREL BISULFATE 75 MG PO TABS
75.0000 mg | ORAL_TABLET | Freq: Every day | ORAL | Status: DC
  Administered 2017-02-09 – 2017-02-12 (×4): 75 mg via ORAL
  Filled 2017-02-09 (×4): qty 1

## 2017-02-09 MED ORDER — CARVEDILOL 25 MG PO TABS
25.0000 mg | ORAL_TABLET | Freq: Two times a day (BID) | ORAL | Status: DC
  Administered 2017-02-09 – 2017-02-12 (×6): 25 mg via ORAL
  Filled 2017-02-09 (×6): qty 1

## 2017-02-09 MED ORDER — SODIUM CHLORIDE 0.9 % IV SOLN: 100.0000 mL | INTRAVENOUS | Status: DC | PRN

## 2017-02-09 MED ORDER — PENTAFLUOROPROP-TETRAFLUOROETH EX AERO
1.0000 "application " | INHALATION_SPRAY | CUTANEOUS | Status: DC | PRN
  Filled 2017-02-09: qty 30

## 2017-02-09 MED ORDER — LIDOCAINE HCL (PF) 1 % IJ SOLN
5.0000 mL | INTRAMUSCULAR | Status: DC | PRN
  Filled 2017-02-09: qty 5

## 2017-02-09 MED ORDER — HEPARIN SODIUM (PORCINE) 1000 UNIT/ML DIALYSIS
1000.0000 [IU] | INTRAMUSCULAR | Status: DC | PRN
  Filled 2017-02-09: qty 1

## 2017-02-09 MED ORDER — LIDOCAINE-PRILOCAINE 2.5-2.5 % EX CREA
1.0000 "application " | TOPICAL_CREAM | CUTANEOUS | Status: DC | PRN
  Filled 2017-02-09: qty 5

## 2017-02-09 MED ORDER — ALTEPLASE 2 MG IJ SOLR: 2.0000 mg | Freq: Once | INTRAMUSCULAR | Status: DC | PRN

## 2017-02-09 MED ORDER — LOSARTAN POTASSIUM 50 MG PO TABS
50.0000 mg | ORAL_TABLET | Freq: Every day | ORAL | Status: DC
  Administered 2017-02-09 – 2017-02-12 (×4): 50 mg via ORAL
  Filled 2017-02-09 (×4): qty 1

## 2017-02-09 NOTE — Progress Notes (Signed)
Pre hd 

## 2017-02-09 NOTE — Progress Notes (Signed)
[name] NAME: Fernando Salas    MR#:  [id number]  DATE OF BIRTH:  Dec 22, 1970  SUBJECTIVE:  CHIEF COMPLAINT:   Chief Complaint  Patient presents with  . Shortness of Breath  . Flank Pain   -Very angry personality. Unhappy with a lot of things. Recently started on dialysis. Came in with shortness of breath due to call[facility] center being closed yesterday with bad weather. -Receiving dialysis today. Blood pressure is still elevated.  REVIEW OF SYSTEMS:  Review of Systems  Constitutional: Negative for chills, fever and malaise/fatigue.  HENT: Negative for congestion, ear discharge, hearing loss and nosebleeds.   Eyes: Negative for blurred vision and double vision.  Respiratory: Positive for shortness of breath. Negative for cough and wheezing.   Cardiovascular: Negative for chest pain, palpitations and leg swelling.  Gastrointestinal: Negative for abdominal pain, constipation, diarrhea, nausea and vomiting.  Genitourinary: Negative for dysuria.  Musculoskeletal: Positive for back pain. Negative for myalgias.  Neurological: Negative for dizziness, speech change, focal weakness, seizures and headaches.  Psychiatric/Behavioral: Negative for depression.    DRUG ALLERGIES:   Allergies  Allergen Reactions  . Ibuprofen Other (See Comments)    Kidney   . Viagra [Sildenafil]     Lips swell   . Clonidine Derivatives Rash    VITALS:  Blood pressure (!) 178/98, pulse 85, temperature 98.2 F (36.8 C), temperature source Oral, resp. rate 19, height 6' (1.829 m), weight 89.8 kg (198 lb), SpO2 96 %.  PHYSICAL EXAMINATION:  Physical Exam  GENERAL:  46 y.o.-year-old patient lying in the bed with no acute distress.  EYES: Pupils equal, round, reactive to light and accommodation. No scleral icterus. Extraocular muscles intact.  HEENT: Head atraumatic, normocephalic. Oropharynx and nasopharynx clear.  NECK:  Supple, no jugular venous  distention. No thyroid enlargement, no tenderness.  LUNGS: Normal breath sounds bilaterally, no wheezing, rales,rhonchi or crepitation. No use of accessory muscles of respiration.  CARDIOVASCULAR: S1, S2 normal. No murmurs, rubs, or gallops. Right chest permacath in place ABDOMEN: Soft, nontender, nondistended. Bowel sounds present. No organomegaly or mass.  EXTREMITIES: No pedal edema, cyanosis, or clubbing.  NEUROLOGIC: Cranial nerves II through XII are intact. Muscle strength 5/5 in all extremities. Sensation intact. Gait not checked.  PSYCHIATRIC: The patient is alert and oriented x 3.  SKIN: No obvious rash, lesion, or ulcer.    LABORATORY PANEL:   CBC Recent Labs  Lab 02/08/17 2029  WBC 8.1  HGB 10.8*  HCT 34.0*  PLT 235   ------------------------------------------------------------------------------------------------------------------  Chemistries  Recent Labs  Lab 02/09/17 0414  NA 140  K 5.7*  CL 108  CO2 18*  GLUCOSE 111*  BUN 81*  CREATININE 11.57*  CALCIUM 8.4*   ------------------------------------------------------------------------------------------------------------------  Cardiac Enzymes Recent Labs  Lab 02/08/17 2029  TROPONINI 0.09*   ------------------------------------------------------------------------------------------------------------------  RADIOLOGY:  Dg Chest Port 1 View  Result Date: 02/08/2017 CLINICAL DATA:  Shortness of Breath EXAM: PORTABLE CHEST 1 VIEW COMPARISON:  01/12/2017 FINDINGS: Cardiac shadow is enlarged. Dialysis catheter is now seen in satisfactory position. Mild central vascular congestion is noted likely related to a degree of volume overload. No focal infiltrate is seen. No sizable effusion is noted. IMPRESSION: Changes of mild congestive failure. Electronically Signed   By: Inez Catalina M.D.   On: 02/08/2017 20:49    EKG:   Orders placed or performed during the hospital encounter of 02/08/17  . EKG 12-Lead  .  EKG 12-Lead  .  EKG 12-Lead  . EKG 12-Lead  . ED EKG  . ED EKG  . EKG 12-Lead  . EKG 12-Lead    ASSESSMENT AND PLAN:   46 year old male with past medical history significant for hypertension not on any home medications, end-stage renal disease recently started on hemodialysis 3 weeks ago comes to emergency room secondary to difficulty breathing and uncontrolled blood pressure.  #1 acute respiratory distress-secondary to pulmonary edema from missing hemodialysis. -Dialysis Center closed for bad weather yesterday. -Appreciate nephrology consult. Being dialyzed today, breathing is improved. On room air.  2. End-stage renal disease on hemodialysis-management per nephrology. Dialysis today and assess tomorrow per schedule. -Wants to change his outpatient dialysis center. We will leave that to nephrology. -Currently has a permacath. On Monday Wednesday Friday schedule.  3. Uncontrolled hypertension-did not fill his medications from recent discharge. Currently on Norvasc, Coreg and losartan. Adjust medications as needed. If needed will add hydralazine.  4. CAD-status post multiple stents according to patient. Currently stable. Continue his Plavix and statin  5. DVT prophylaxis-on subcutaneous heparin  Anticipate discharge tomorrow blood pressure is controlled   All the records are reviewed and case discussed with Care Management/Social Workerr. Management plans discussed with the patient, family and they are in agreement.  CODE STATUS: Full code  TOTAL TIME TAKING CARE OF THIS PATIENT: 39 minutes.   POSSIBLE D/C tomorrow, DEPENDING ON CLINICAL CONDITION.   Gladstone Lighter M.D on 02/09/2017 at 2:03 PM  Between 7am to 6pm - Pager - [phone]  After 6pm go to [email or web address] - password [email or web address]  [facility]  Office  [phone]  CC: Primary care physician; [name], MD

## 2017-02-09 NOTE — Care Management (Signed)
Per [facility] HD liaison  "Patient is asking to be transferred to [place]. DC called to start transfer to [facility]. Spoke to Whitman Hero, was informed that transfer will have to be completed as clinic to clinic, verses hospital to clinic. Due to needing to have a meeting with patient and obtain all paper work. DC informed patient that he will probably need to go back to his clinic, [facility], for a few treatments until the transfer was completed. Patient stated that he if he had to go back to [facility] that he would bring his gun and that there would not be a [facility] anymore. Also stated that if he was going to die that he would not die alone and that he would make sure to take others from [facility] with him. Also, if his fiance had to deal with the pain of his death that he would make sure that others would suffer the lost of their love ones. DC informed Dr. Holley Raring and clinic."

## 2017-02-09 NOTE — Progress Notes (Signed)
Pre hd assessment Patient on room air. States he hasn't been wearing his oxygen this morning. Sats currently stable.

## 2017-02-09 NOTE — Progress Notes (Signed)
Patient had one episode of unmeasured yellow emesis. Patient given Zofran IV and now denies nausea and there has been no new episodes of emesis. Will continue to monitor patient.

## 2017-02-09 NOTE — Progress Notes (Signed)
Patient came back from dialysis upset today about someone trying to make him go back to the same dialysis center per his report. Social worker went in to speak with him about signing form for insurance and became upset and told her not to explain the form, the social worker left the room per patient and did not let him sign the form, per patient report. Every encounter following dialysis with patient he was upset with the staff, he ordered his dinner tray and became upset that he did not receive mashed potatoes and instead got rice, he then sent the tray back to the cafeteria and called to order another and became upset that dietary did not answer the phone for him in a timely manner. Nurse offered to try and call and upon calling was placed on hold, patient felt that dietary was not answering simply because it was him. When nurse attempted to give the patient the phone to place order, the patient stated " no I don't want to order off your phone, I want them to answer the phone for me". Nurse relayed message to dietary staff ended the call and exited the room. Patient then called and reported that he was having severe cramping at site where hernia was in groin area and in his legs he asked for mustard and stated " that's the only thing that will help" patient educated on the salt content of mustard and continued to ask for the mustard and began getting upset mustard was given to patient". Patient then asked if the doctor could get him something for the cramping, Dr. Tressia Miners called and gave verbal order for norco upon notifying patient of this patient stated " no I don't want it they keep trying to put me on all these medicines and I don't know if it's going to cause me another problem, so no I don't want it". Nurse told patient ok and exited the room order for 5-325 mg Norco not entered.

## 2017-02-09 NOTE — Progress Notes (Signed)
HD initiated via R Chest HD catheter. No heparin treatment. Patient irritable but cooperative. Continue to monitor.

## 2017-02-09 NOTE — Care Management Obs Status (Signed)
[place] NOTIFICATION   Patient Details  Name: Fernando Salas MRN: [id number] Date of Birth: Nov 13, 1970   Medicare Observation Status Notification Given:  Yes(patient signed form)    Beverly Sessions, RN 02/09/2017, 4:29 PM

## 2017-02-09 NOTE — Care Management Obs Status (Signed)
[place] NOTIFICATION   Patient Details  Name: OUSMANE SEEMAN MRN: [id number] Date of Birth: 1971-02-19   Medicare Observation Status Notification Given:  Yes(Letter reviewed with patient.  Declined to sign)    Beverly Sessions, RN 02/09/2017, 3:33 PM

## 2017-02-09 NOTE — Progress Notes (Addendum)
Post HD assessment unchanged. Patient remains irritable. Currently discussing situation with dialysis coordinator. Patient is not willing to go back to clinic tomorrow. Stating "I will not go back to [facility], If I have to go back over there, if I'm going to die I'm going to take people with me." He also stated "Ive walked in there without a gun, I can walk in there with one too, write this down."   Dialysis coordinator currently notifying Dr. Holley Raring.

## 2017-02-09 NOTE — Care Management (Signed)
RNCM entered room to review Medicare Observation letter. Upon entering the patient was frustrated that the "other sandwich" he had ordered hadn't arrived yet. Patient states "don't try bog me down with information I don't need."  RNCM informed patient that I would only review the information pertinent to him.  RNCM continue to review form.  Patient then signaled time out with his hands and loudly stated "Time out, Time out.  Do you know how long I have been dealing with this?  I don't need to hear about any of this".  RNCM notified patient that I could document he declined to receive the information. RNCM informed patient that he could call about his sandwich, and provided him number to dietary.  At that time patient states "you can get out of her with your smartass mouth"  Update: bedside RN informed RNCM that patient was now willing to signed the form.  RNCM did not enter the patient's room again.  Bedside RN took hard copy of the form in and patient signed.

## 2017-02-09 NOTE — Progress Notes (Signed)
[facility]  ROUNDING NOTE   Subjective:  Patient well-known to [place]. He was unable to go to hemodialysis yesterday as the outpatient dialysis center was closed. Patient subsequently developed as of breath and came here for evaluation. Chest x-ray did show pulmonary edema. Blood pressure also quite elevated. He has not yet picked up his antihypertensives.   Objective:  Vital signs in last 24 hours:  Temp:  [98.2 F (36.8 C)-98.6 F (37 C)] 98.2 F (36.8 C) (12/11 0325) Pulse Rate:  [70-96] 82 (12/11 0646) Resp:  [19-24] 20 (12/11 0325) BP: (164-210)/(89-132) 176/100 (12/11 0646) SpO2:  [93 %-100 %] 100 % (12/11 0325) Weight:  [90.7 kg (200 lb)-90.9 kg (200 lb 8 oz)] 90.9 kg (200 lb 8 oz) (12/11 0525)  Weight change:  Filed Weights   02/08/17 2033 02/09/17 0525  Weight: 90.7 kg (200 lb) 90.9 kg (200 lb 8 oz)    Intake/Output: I/O last 3 completed shifts: In: 120 [P.O.:120] Out: 200 [Urine:200]   Intake/Output this shift:  No intake/output data recorded.  Physical Exam: General: No acute distress  Head: Normocephalic, atraumatic. Moist oral mucosal membranes  Eyes: Mild icterus  Neck: Supple, trachea midline  Lungs:  Basilar rales, normal effort  Heart: S1S2 no rubs  Abdomen:  Soft, nontender, bowel sounds present  Extremities: 1+ peripheral edema.  Neurologic: Awake, alert, following commands  Skin: No lesions  Access: R IJ PC    Basic Metabolic Panel: Recent Labs  Lab 02/08/17 2029 02/09/17 0414  NA 140 140  K 5.1 5.7*  CL 108 108  CO2 15* 18*  GLUCOSE 106* 111*  BUN 75* 81*  CREATININE 11.02* 11.57*  CALCIUM 8.4* 8.4*    Liver Function Tests: No results for input(s): AST, ALT, ALKPHOS, BILITOT, PROT, ALBUMIN in the last 168 hours. No results for input(s): LIPASE, AMYLASE in the last 168 hours. No results for input(s): AMMONIA in the last 168 hours.  CBC: Recent Labs  Lab 02/08/17 2029  WBC 8.1  NEUTROABS 6.2  HGB 10.8*  HCT  34.0*  MCV 97.5  PLT 235    Cardiac Enzymes: Recent Labs  Lab 02/08/17 2029  TROPONINI 0.09*    BNP: Invalid input(s): POCBNP  CBG: No results for input(s): GLUCAP in the last 168 hours.  Microbiology: Results for orders placed or performed during the hospital encounter of 01/12/17  MRSA PCR Screening     Status: None   Collection Time: 01/12/17 11:22 AM  Result Value Ref Range Status   MRSA by PCR NEGATIVE NEGATIVE Final    Comment:        The GeneXpert MRSA Assay (FDA approved for NASAL specimens only), is one component of a comprehensive MRSA colonization surveillance program. It is not intended to diagnose MRSA infection nor to guide or monitor treatment for MRSA infections.     Coagulation Studies: No results for input(s): LABPROT, INR in the last 72 hours.  Urinalysis: No results for input(s): COLORURINE, LABSPEC, PHURINE, GLUCOSEU, HGBUR, BILIRUBINUR, KETONESUR, PROTEINUR, UROBILINOGEN, NITRITE, LEUKOCYTESUR in the last 72 hours.  Invalid input(s): APPERANCEUR    Imaging: Dg Chest Port 1 View  Result Date: 02/08/2017 CLINICAL DATA:  Shortness of Breath EXAM: PORTABLE CHEST 1 VIEW COMPARISON:  01/12/2017 FINDINGS: Cardiac shadow is enlarged. Dialysis catheter is now seen in satisfactory position. Mild central vascular congestion is noted likely related to a degree of volume overload. No focal infiltrate is seen. No sizable effusion is noted. IMPRESSION: Changes of mild congestive failure. Electronically Signed  By: Inez Catalina M.D.   On: 02/08/2017 20:49     Medications:   . sodium chloride    . sodium chloride    . sodium chloride     . amLODipine  10 mg Oral Daily  . heparin  5,000 Units Subcutaneous Q8H  . sodium chloride flush  3 mL Intravenous Q12H  . sodium chloride flush  3 mL Intravenous Q12H   sodium chloride, sodium chloride, sodium chloride, acetaminophen **OR** acetaminophen, alteplase, heparin, hydrALAZINE,  HYDROcodone-acetaminophen, ipratropium-albuterol, lidocaine (PF), lidocaine-prilocaine, ondansetron **OR** ondansetron (ZOFRAN) IV, pentafluoroprop-tetrafluoroeth, polyethylene glycol, sodium chloride flush  Assessment/ Plan:  46 y.o.  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   [facility]/MWF:    1.  ESRD on HD MWF.  Patient was unable to get outpatient hemodialysis yesterday as the facility was closed.  Therefore patient will undergo hemodialysis today.  Patient has been unhappy with the care he has received at the new dialysis center.  He has requested that he be transferred to another dialysis center in [facility].    2.  Anemia of chronic kidney disease.  Hemoglobin currently 10.8.  Hold off on Epogen for now.  3.  Hyperkalemia.  Serum potassium currently 5.7.  This will be treated with hemodialysis.  4.  Secondary hyperparathyroidism.  We will check PTH and phosphorus with dialysis today.  Calcium noted to be low at 8.4.  5.  Hypertension.  Continue the patient on amlodipine 10 mg p.o. daily.  We will also start the patient back on carvedilol.  Add losartan 50mg  po daily to his medication regimen as well.      LOS: 0 Taline Nass 12/11/201810:06 AM

## 2017-02-09 NOTE — Consult Note (Signed)
[facility] Consult   Reason for Consult: Consult for 46 year old man with renal failure.  Concerned about threatening statements and hostile behavior Referring Physician:  Tressia Miners Patient Identification: Fernando Salas MRN:  [id number] Principal Diagnosis: Adult antisocial behavior Diagnosis:   Patient Active Problem List   Diagnosis Date Noted  . Adult antisocial behavior [Z72.811] 02/09/2017  . Fluid overload [E87.70] 02/08/2017  . Accelerated hypertension [I10] 01/12/2017  . AKI (acute kidney injury) ([place]) [N17.9]   . Unstable angina ([place]) [I20.0] 11/01/2016    Total Time spent with patient: 45 minutes  Subjective:   Fernando Salas is a 46 y.o. male patient admitted with "sir, I prefer not to speak to you and I ask you to please leave".  HPI: Consult received.  Spoke with case management and hospitalist and nursing.  Chart reviewed.  Attempted to interview patient but he refused to speak to me.  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  Patient is reported to have made several statements to several different members of the treatment team recently conveying threats.  I am told that he made threats to use a firearm to assault people at his dialysis center and has been hostile inappropriately angry here in the hospital.  Patient's statements were concerning enough that staff was concerned about safety.  Patient does not appear to have actually assaulted anyone here in the hospital at this time.  There is no report about any suicidal statement and no report of any psychosis or psychotic-like behavior.  I attempted to interview the patient.  He came into his room after knocking and introduced myself.  Patient was awake alert and sitting up in a chair.  2 women were also present in the room with him.  After I introduced myself the patient told me that he did not wish to speak  with me at all and asked me to please leave.  He told me that he had been assessed by psychiatrist in the past and had wound up "up on the [facility]".  He told me that I should simply write what ever I wanted to about him without speaking to him.  I have spoken with other members of the treatment team.  I am told that it has been confirmed that he does have a history of criminal charges for violent behavior in the past.  Social history: Details are not known to me at this time.  Unclear to me how stable his living situation is.  Medical history: Long-standing problems with multiple medical issues.  History of high blood pressure probably causing myocardial infarctions and at least one documented stroke.  End-stage renal disease now on dialysis.  Continued angina.  Substance abuse history: Obviously I did not have the opportunity to ask the patient about this.  There are mentions in some old records of the patient is saying he smoked marijuana a great deal.  Unknown whether that is still going on.  We do not have any recent drug screen.    Past Psychiatric History: I have little information to go on.  The patient tells me that he has been evaluated by psychiatrist in the past and indicates that it had some kind of bad outcome for him but because he refused to cooperate in the interview I could not get any more detail about that.  The only psychiatric note I could find in the current record  is of a consult performed several years ago during a hospitalization when he was also being uncooperative and possibly hostile with staff.  There was no psychiatric diagnosis offered at that time.  Risk to Self: Is patient at risk for suicide?: No Risk to Others:   Prior Inpatient Therapy:   Prior Outpatient Therapy:    Past Medical History:  Past Medical History:  Diagnosis Date  . CAD (coronary artery disease)    status post multiple myocardial infarctions  . Chronic kidney disease   . COPD (chronic  obstructive pulmonary disease) ([place])   . Hyperlipidemia   . Hypertension   . Myocardial infarction ([place])    " I had 13 heart attacks, 8 massive"  . Proteinuria   . Renal insufficiency   . Secondary hyperparathyroidism of renal origin ([place])   . Shortness of breath dyspnea    " when I drink a cup of cold water too fast"  . Sleep apnea    pt stated " long story" does not wear CPAP  . Stroke [facility])     Past Surgical History:  Procedure Laterality Date  . AV FISTULA PLACEMENT Left 01-24-14   By Dr. Hortencia Pilar in [place]  . AV FISTULA PLACEMENT Right 04/30/2014   Procedure: RIGHT BRACHIOCEPHALIC ARTERIOVENOUS (AV) FISTULA CREATION;  Surgeon: Elam Dutch, MD;  Location: [facility] OR;  Service: Vascular;  Laterality: Right;  . AV FISTULA PLACEMENT Left 05/31/2014   Procedure: Left INSERTION OF ARTERIOVENOUS (AV) GORE-TEX GRAFT ARM;  Surgeon: Elam Dutch, MD;  Location: [facility];  Service: Vascular;  Laterality: Left;  . CARDIAC CATHETERIZATION    . CORONARY STENT PLACEMENT     13 stents total  . DIALYSIS/PERMA CATHETER INSERTION N/A 01/15/2017   Procedure: DIALYSIS/PERMA CATHETER INSERTION;  Surgeon: Katha Cabal, MD;  Location: [facility] CV LAB;  Service: Cardiovascular;  Laterality: N/A;   Family History:  Family History  Problem Relation Age of Onset  . Heart disease Mother   . Hyperlipidemia Mother   . Hypertension Mother   . Kidney disease Father   . Kidney disease Brother   . Hyperlipidemia Sister   . Hyperlipidemia Daughter   . Hypertension Sister   . Hypertension Daughter    Family Psychiatric  History: Unknown Social History:  Social History   Substance and Sexual Activity  Alcohol Use No  . Alcohol/week: 0.0 oz  . Frequency: Never   Comment: occasional     Social History   Substance and Sexual Activity  Drug Use Yes  . Types: Marijuana   Comment: daily    Social History   Socioeconomic History  . Marital status: Single    Spouse name:  None  . Number of children: None  . Years of education: None  . Highest education level: None  Social Needs  . Financial resource strain: None  . Food insecurity - worry: None  . Food insecurity - inability: None  . Transportation needs - medical: None  . Transportation needs - non-medical: None  Occupational History  . None  Tobacco Use  . Smoking status: Current Every Day Smoker    Packs/day: 0.25    Years: 25.00    Pack years: 6.25    Types: Cigarettes  . Smokeless tobacco: Never Used  Substance and Sexual Activity  . Alcohol use: No    Alcohol/week: 0.0 oz    Frequency: Never    Comment: occasional  . Drug use: Yes    Types: Marijuana  Comment: daily  . Sexual activity: None    Comment: " Smokes weed"  Other Topics Concern  . None  Social History Narrative  . None   Additional Social History:    Allergies:   Allergies  Allergen Reactions  . Ibuprofen Other (See Comments)    Kidney   . Viagra [Sildenafil]     Lips swell   . Clonidine Derivatives Rash    Labs:  Results for orders placed or performed during the hospital encounter of 02/08/17 (from the past 48 hour(s))  CBC with Differential     Status: Abnormal   Collection Time: 02/08/17  8:29 PM  Result Value Ref Range   WBC 8.1 3.8 - 10.6 K/uL   RBC 3.49 (L) 4.40 - 5.90 MIL/uL   Hemoglobin 10.8 (L) 13.0 - 18.0 g/dL   HCT 34.0 (L) 40.0 - 52.0 %   MCV 97.5 80.0 - 100.0 fL   MCH 31.1 26.0 - 34.0 pg   MCHC 31.9 (L) 32.0 - 36.0 g/dL   RDW 17.2 (H) 11.5 - 14.5 %   Platelets 235 150 - 440 K/uL   Neutrophils Relative % 77 %   Neutro Abs 6.2 1.4 - 6.5 K/uL   Lymphocytes Relative 13 %   Lymphs Abs 1.1 1.0 - 3.6 K/uL   Monocytes Relative 9 %   Monocytes Absolute 0.8 0.2 - 1.0 K/uL   Eosinophils Relative 0 %   Eosinophils Absolute 0.0 0 - 0.7 K/uL   Basophils Relative 1 %   Basophils Absolute 0.1 0 - 0.1 K/uL  Basic metabolic panel     Status: Abnormal   Collection Time: 02/08/17  8:29 PM  Result  Value Ref Range   Sodium 140 135 - 145 mmol/L   Potassium 5.1 3.5 - 5.1 mmol/L   Chloride 108 101 - 111 mmol/L   CO2 15 (L) 22 - 32 mmol/L   Glucose, Bld 106 (H) 65 - 99 mg/dL   BUN 75 (H) 6 - 20 mg/dL   Creatinine, Ser 11.02 (H) 0.61 - 1.24 mg/dL   Calcium 8.4 (L) 8.9 - 10.3 mg/dL   GFR calc non Af Amer 5 (L) >60 mL/min   GFR calc Af Amer 6 (L) >60 mL/min    Comment: (NOTE) The eGFR has been calculated using the CKD EPI equation. This calculation has not been validated in all clinical situations. eGFR's persistently <60 mL/min signify possible Chronic Kidney Disease.    Anion gap 17 (H) 5 - 15  Brain natriuretic peptide     Status: Abnormal   Collection Time: 02/08/17  8:29 PM  Result Value Ref Range   B Natriuretic Peptide >4,500.0 (H) 0.0 - 100.0 pg/mL    Comment: RESULT CONFIRMED BY MANUAL DILUTION ALV  Troponin I     Status: Abnormal   Collection Time: 02/08/17  8:29 PM  Result Value Ref Range   Troponin I 0.09 (HH) <0.03 ng/mL    Comment: CRITICAL RESULT CALLED TO, READ BACK BY AND VERIFIED WITH Debby Freiberg RN AT 2100 02/08/17. MSS   Basic metabolic panel     Status: Abnormal   Collection Time: 02/09/17  4:14 AM  Result Value Ref Range   Sodium 140 135 - 145 mmol/L   Potassium 5.7 (H) 3.5 - 5.1 mmol/L   Chloride 108 101 - 111 mmol/L   CO2 18 (L) 22 - 32 mmol/L   Glucose, Bld 111 (H) 65 - 99 mg/dL   BUN 81 (H) 6 - 20 mg/dL  Creatinine, Ser 11.57 (H) 0.61 - 1.24 mg/dL   Calcium 8.4 (L) 8.9 - 10.3 mg/dL   GFR calc non Af Amer 5 (L) >60 mL/min   GFR calc Af Amer 5 (L) >60 mL/min    Comment: (NOTE) The eGFR has been calculated using the CKD EPI equation. This calculation has not been validated in all clinical situations. eGFR's persistently <60 mL/min signify possible Chronic Kidney Disease.    Anion gap 14 5 - 15  Phosphorus     Status: Abnormal   Collection Time: 02/09/17 10:05 AM  Result Value Ref Range   Phosphorus 7.8 (H) 2.5 - 4.6 mg/dL    Current  Facility-Administered Medications  Medication Dose Route Frequency Provider Last Rate Last Dose  . 0.9 %  sodium chloride infusion  250 mL Intravenous PRN Salary, Montell D, MD      . acetaminophen (TYLENOL) tablet 650 mg  650 mg Oral Q6H PRN Salary, Montell D, MD       Or  . acetaminophen (TYLENOL) suppository 650 mg  650 mg Rectal Q6H PRN Salary, Montell D, MD      . amLODipine (NORVASC) tablet 10 mg  10 mg Oral Daily Salary, Montell D, MD   10 mg at 02/09/17 1421  . atorvastatin (LIPITOR) tablet 40 mg  40 mg Oral q1800 Gladstone Lighter, MD      . carvedilol (COREG) tablet 25 mg  25 mg Oral BID WC Lateef, Munsoor, MD      . clopidogrel (PLAVIX) tablet 75 mg  75 mg Oral Daily Gladstone Lighter, MD   75 mg at 02/09/17 1457  . heparin injection 5,000 Units  5,000 Units Subcutaneous Q8H Salary, Montell D, MD   5,000 Units at 02/09/17 1418  . hydrALAZINE (APRESOLINE) injection 20 mg  20 mg Intravenous Q4H PRN Salary, Montell D, MD   20 mg at 02/09/17 0539  . HYDROcodone-acetaminophen (NORCO/VICODIN) 5-325 MG per tablet 1-2 tablet  1-2 tablet Oral Q4H PRN Salary, Montell D, MD      . ipratropium-albuterol (DUONEB) 0.5-2.5 (3) MG/3ML nebulizer solution 3 mL  3 mL Nebulization Q4H PRN Salary, Montell D, MD      . losartan (COZAAR) tablet 50 mg  50 mg Oral Daily Lateef, Munsoor, MD   50 mg at 02/09/17 1417  . ondansetron (ZOFRAN) tablet 4 mg  4 mg Oral Q6H PRN Salary, Montell D, MD       Or  . ondansetron (ZOFRAN) injection 4 mg  4 mg Intravenous Q6H PRN Salary, Montell D, MD   4 mg at 02/09/17 0001  . polyethylene glycol (MIRALAX / GLYCOLAX) packet 17 g  17 g Oral Daily PRN Salary, Montell D, MD      . sodium chloride flush (NS) 0.9 % injection 3 mL  3 mL Intravenous Q12H Salary, Avel Peace, MD   Stopped at 02/09/17 1139  . sodium chloride flush (NS) 0.9 % injection 3 mL  3 mL Intravenous Q12H Salary, Montell D, MD   3 mL at 02/09/17 1426    Musculoskeletal: Strength & Muscle Tone: within normal  limits Gait & Station: Did not get to see him walk.  Presumed within normal limits Patient leans: N/A  Psychiatric Specialty Exam: Physical Exam  Vitals reviewed. Musculoskeletal: Normal range of motion.  Neurological: He is alert.  Psychiatric: His speech is normal. His affect is angry. He is agitated. Thought content is paranoid. He expresses impulsivity.    Review of Systems  Unable to perform ROS: Other  Blood pressure (!) 194/103, pulse 88, temperature 98.5 F (36.9 C), temperature source Oral, resp. rate 19, height 6' (1.829 m), weight 89.8 kg (198 lb), SpO2 91 %.Body mass index is 26.85 kg/m.  General Appearance: Casual  Eye Contact:  Good  Speech:  Normal Rate  Volume:  Decreased  Mood:  Euthymic  Affect:  Blunt  Thought Process:  Coherent  Orientation:  Other:  Could not judge directly  Thought Content:  Unknown  Suicidal Thoughts:  Unknown.  From what I am told secondhand the patient may have implied that he would shoot a lot of people and possibly also shoot himself but it does not sound like there was any concern that he was acutely threatening to harm himself.  Homicidal Thoughts:  Patient is reported to have made threats to shoot and kill people.  He did not make any of those statements directly to me.  Please refer to other reports.  Memory:  NA  Judgement:  Other:  Could not assess  Insight:  Could not assess  Psychomotor Activity:  Normal  Concentration:  Concentration: Could not assess  Recall:  Could not assess  Fund of Knowledge:  Fair  Language:  Good  Akathisia:  No  Handed:  Right  AIMS (if indicated):     Assets:  Financial Resources/Insurance Resilience  ADL's:  Impaired  Cognition:  WNL  Sleep:        Treatment Plan Summary: Plan This is a 45 year old man without any known past psychiatric history other than a reported history of cannabis abuse.  It was reported to me by several members of the treatment team that he has made threats that he  would harm or kill other people within the hearing of members of the treatment team.  Patient refused to cooperate with the interview with me.  Under the circumstances I must assume that he retains the capacity to make that decision.  There is nothing in the old chart however to suggest that the patient has a mental illness.  Under the circumstances I think the appropriate thing for the treatment team to do is to assume that this is not a matter of mental illness but a matter of threats of violence and to treat it appropriately so by notifying legal authorities and consulting hospital authorities.  There is no evidence right now that placing a sitter in the patient's room would improve the situation and may simply make him more angry.  There is no evidence to support an involuntary commitment because I do not have a suspicion that he is mentally ill.  At this point I do not have any treatment to offer.  Please contact me again if I can be of any further assistance.  I would say finally that given what I am told about his having a past history of being charged with violent crimes that there is good reason to take threatening statements seriously, but I think this is already obvious to the treatment team.  Disposition: See note above  Alethia Berthold, MD 02/09/2017 4:40 PM

## 2017-02-10 DIAGNOSIS — I132 Hypertensive heart and chronic kidney disease with heart failure and with stage 5 chronic kidney disease, or end stage renal disease: Secondary | ICD-10-CM | POA: Diagnosis not present

## 2017-02-10 LAB — HEPATITIS B SURFACE ANTIGEN: HEP B S AG: NEGATIVE

## 2017-02-10 LAB — PHOSPHORUS: PHOSPHORUS: 4.5 mg/dL (ref 2.5–4.6)

## 2017-02-10 LAB — PARATHYROID HORMONE, INTACT (NO CA): PTH: 545 pg/mL — ABNORMAL HIGH (ref 15–65)

## 2017-02-10 MED ORDER — HYDRALAZINE HCL 50 MG PO TABS
50.0000 mg | ORAL_TABLET | Freq: Three times a day (TID) | ORAL | Status: DC
  Administered 2017-02-10 – 2017-02-12 (×6): 50 mg via ORAL
  Filled 2017-02-10 (×6): qty 1

## 2017-02-10 NOTE — Progress Notes (Signed)
HD tx start 

## 2017-02-10 NOTE — Progress Notes (Signed)
[facility] [place] NAME: Fernando Salas    MR#:  [id number]  DATE OF BIRTH:  1971/02/22  SUBJECTIVE:  CHIEF COMPLAINT:   Chief Complaint  Patient presents with  . Shortness of Breath  . Flank Pain   -Blood pressure is elevated today. Appears very frustrated but not threatening today. -Made homicidal threats against staff at dialysis Center. Placed under involuntary commitment by law enforcement. -Animal nutritionist present in the room during exam and also RN.  REVIEW OF SYSTEMS:  Review of Systems  Constitutional: Negative for chills, fever and malaise/fatigue.  HENT: Negative for congestion, ear discharge, hearing loss and nosebleeds.   Eyes: Negative for blurred vision and double vision.  Respiratory: Positive for shortness of breath. Negative for cough and wheezing.   Cardiovascular: Negative for chest pain, palpitations and leg swelling.  Gastrointestinal: Negative for abdominal pain, constipation, diarrhea, nausea and vomiting.  Genitourinary: Negative for dysuria.  Musculoskeletal: Positive for myalgias. Negative for back pain.  Neurological: Negative for dizziness, speech change, focal weakness, seizures and headaches.  Psychiatric/Behavioral: Negative for depression.    DRUG ALLERGIES:   Allergies  Allergen Reactions  . Ibuprofen Other (See Comments)    Kidney   . Viagra [Sildenafil]     Lips swell   . Clonidine Derivatives Rash    VITALS:  Blood pressure (!) 180/107, pulse 73, temperature 98.3 F (36.8 C), temperature source Oral, resp. rate 18, height 6' (1.829 m), weight 89.9 kg (198 lb 3.2 oz), SpO2 96 %.  PHYSICAL EXAMINATION:  Physical Exam  GENERAL:  46 y.o.-year-old patient lying in the bed with no acute distress.  EYES: Pupils equal, round, reactive to light and accommodation. No scleral icterus. Extraocular muscles intact.  HEENT: Head atraumatic, normocephalic. Oropharynx and nasopharynx clear.  NECK:   Supple, no jugular venous distention. No thyroid enlargement, no tenderness.  LUNGS: Normal breath sounds bilaterally, no wheezing, rales,rhonchi or crepitation. No use of accessory muscles of respiration.  CARDIOVASCULAR: S1, S2 normal. No murmurs, rubs, or gallops. Right chest permacath in place ABDOMEN: Soft, nontender, nondistended. Bowel sounds present. No organomegaly or mass.  EXTREMITIES: No pedal edema, cyanosis, or clubbing.  NEUROLOGIC: Cranial nerves II through XII are intact. Muscle strength 5/5 in all extremities. Sensation intact. Gait not checked.  PSYCHIATRIC: The patient is alert and oriented x 3. Very angry personality SKIN: No obvious rash, lesion, or ulcer.    LABORATORY PANEL:   CBC Recent Labs  Lab 02/08/17 2029  WBC 8.1  HGB 10.8*  HCT 34.0*  PLT 235   ------------------------------------------------------------------------------------------------------------------  Chemistries  Recent Labs  Lab 02/09/17 0414  NA 140  K 5.7*  CL 108  CO2 18*  GLUCOSE 111*  BUN 81*  CREATININE 11.57*  CALCIUM 8.4*   ------------------------------------------------------------------------------------------------------------------  Cardiac Enzymes Recent Labs  Lab 02/08/17 2029  TROPONINI 0.09*   ------------------------------------------------------------------------------------------------------------------  RADIOLOGY:  Dg Chest Port 1 View  Result Date: 02/08/2017 CLINICAL DATA:  Shortness of Breath EXAM: PORTABLE CHEST 1 VIEW COMPARISON:  01/12/2017 FINDINGS: Cardiac shadow is enlarged. Dialysis catheter is now seen in satisfactory position. Mild central vascular congestion is noted likely related to a degree of volume overload. No focal infiltrate is seen. No sizable effusion is noted. IMPRESSION: Changes of mild congestive failure. Electronically Signed   By: Inez Catalina M.D.   On: 02/08/2017 20:49    EKG:   Orders placed or performed during the  hospital encounter of 02/08/17  . EKG 12-Lead  .  EKG 12-Lead  . EKG 12-Lead  . EKG 12-Lead  . ED EKG  . ED EKG  . EKG 12-Lead  . EKG 12-Lead  . EKG    ASSESSMENT AND PLAN:   46 year old male with past medical history significant for hypertension not on any home medications, end-stage renal disease recently started on hemodialysis 3 weeks ago comes to emergency room secondary to difficulty breathing and uncontrolled blood pressure.  #1 acute respiratory distress-secondary to pulmonary edema from missing hemodialysis on 02/08/17 due to weather. -Appreciate nephrology consult.  -Received dialysis yesterday. Went for dialysis today per schedule, however did not finish treatment as permacath clotted and patient refused to be restarted on dialysis. -Currently on room air and breathing better.  2. End-stage renal disease on hemodialysis-management per nephrology.  -Patient has made some homicidal threats against the staff at [facility] so no local [facility] wants to take him as an outpatient. Awaiting to hear from [facility] if they would accept the patient. If not he will be here in the hospital until outpatient dialysis disposition can be arranged. Appreciate nephrology input -Currently has a permacath. On Monday Wednesday Friday schedule.  3. Uncontrolled hypertension-did not fill his medications from recent discharge. Currently on Norvasc, Coreg and losartan. Adjust medications as needed.  -Added oral hydralazine as well  4. CAD-status post multiple stents according to patient. Currently stable. Continue his Plavix and statin  5. DVT prophylaxis-on subcutaneous heparin  6. Antisocial personality-made some depth records to her outpatient dialysis staff. Placed under commitment by law enforcement. Discussed with Education officer, museum, RN, nephrologist.  -Psychiatry has been notified. Law enforcement need to make sure that patient does not have both an at home. Patient is under  involuntary commitment, psychiatric team might likely rescend the commitment. Will benefit from having a Animal nutritionist around the room though.   All the records are reviewed and case discussed with Care Management/Social Workerr. Management plans discussed with the patient, family and they are in agreement.  CODE STATUS: Full code  TOTAL TIME TAKING CARE OF THIS PATIENT: 45 minutes.   POSSIBLE D/C ? , DEPENDING ON CLINICAL CONDITION.   Gladstone Lighter M.D on 02/10/2017 at 2:58 PM  Between 7am to 6pm - Pager - [phone]  After 6pm go to [email or web address] - password [email or web address]  [facility]  Office  [phone]  CC: Primary care physician; [name], MD

## 2017-02-10 NOTE — Progress Notes (Signed)
Pre HD assessment  

## 2017-02-10 NOTE — Progress Notes (Signed)
System clotted, pt refused to restart tx, pt signed AMA, MD notified.

## 2017-02-10 NOTE — Progress Notes (Signed)
Post HD assessment  

## 2017-02-10 NOTE — Progress Notes (Signed)
HD tx end  

## 2017-02-10 NOTE — Progress Notes (Signed)
[place] [employer] arrived and served IVC papers on patient r/t report made to [place], [employer]  Security stationed outside of patient room.

## 2017-02-10 NOTE — Progress Notes (Signed)
Patient would not speak to interviewer today.  He will not allow physical exam as well.  He is willing to undergo hemodialysis therefore we have prepared orders.  He will no longer be able to come to any local [facility] as an outpatient.  He was informed by the police regarding this.  At the moment he does not have the ability to obtain outpatient hemodialysis.  Dialysis liaison is working with [facility] to see if they will accept the patient.  If they decline the patient then he will likely have to come to the hospital periodically for dialysis.  Currently he is on involuntary commitment.  Psychiatry to see the patient again soon.  Please see prior notes from dialysis liaison regarding patient communicating threats against outpatient dialysis staff at [facility].

## 2017-02-11 DIAGNOSIS — I132 Hypertensive heart and chronic kidney disease with heart failure and with stage 5 chronic kidney disease, or end stage renal disease: Secondary | ICD-10-CM | POA: Diagnosis not present

## 2017-02-11 DIAGNOSIS — Z72811 Adult antisocial behavior: Secondary | ICD-10-CM | POA: Diagnosis not present

## 2017-02-11 LAB — BASIC METABOLIC PANEL
ANION GAP: 8 (ref 5–15)
BUN: 51 mg/dL — AB (ref 6–20)
CHLORIDE: 104 mmol/L (ref 101–111)
CO2: 26 mmol/L (ref 22–32)
Calcium: 7.5 mg/dL — ABNORMAL LOW (ref 8.9–10.3)
Creatinine, Ser: 7.82 mg/dL — ABNORMAL HIGH (ref 0.61–1.24)
GFR calc Af Amer: 9 mL/min — ABNORMAL LOW (ref >60)
GFR, EST NON AFRICAN AMERICAN: 7 mL/min — AB (ref >60)
GLUCOSE: 106 mg/dL — AB (ref 65–99)
POTASSIUM: 4.3 mmol/L (ref 3.5–5.1)
Sodium: 138 mmol/L (ref 135–145)

## 2017-02-11 NOTE — Progress Notes (Signed)
MD notified of 5 beat run of v-tach. No new orders.

## 2017-02-11 NOTE — Consult Note (Signed)
[facility] Consult   Reason for Consult: Follow-up consult for 46 year old man in the hospital with problems related to dialysis.  Concern about appropriateness of his involuntary commitment Referring Physician: Posey Pronto Patient Identification: Fernando Salas MRN:  [id number] Principal Diagnosis: Adult antisocial behavior Diagnosis:   Patient Active Problem List   Diagnosis Date Noted  . Adult antisocial behavior [Z72.811] 02/09/2017  . Fluid overload [E87.70] 02/08/2017  . Accelerated hypertension [I10] 01/12/2017  . AKI (acute kidney injury) ([place]) [N17.9]   . Unstable angina ([place]) [I20.0] 11/01/2016    Total Time spent with patient: 30 minutes  Subjective:   Fernando Salas is a 46 y.o. male patient admitted with "let me tell you how they treated me".  HPI: This is a follow-up consult for this 46 year old man.  See my previous note.  The last time I attempted to see him the patient refused to speak with me at all.  This time I once again introduced myself and told him that I would like to speak to him and that if I could I might be able to facilitate discharge which seems to be what he is interested in.  Patient at first was still difficult to engage.  He has a paranoid type mindset and approach to his treatment in general.  Ultimately he did speak to me but he insisted on telling me the whole story of what has made him so angry about his dialysis.  He has a good memory and recounts all the details but his memory of things is very much colored by his belief that other people tend to treat him wrong and are not doing their job correctly.  He did not however make any statements that appeared to be psychotic or delusional and was not delirious or confused.  His affect was appropriate to what he was talking about.  Ultimately after providing a supportive here for his story I was able to ask him if he had any thought or intention of harming anyone outside the hospital.  I showed him the  specific concerns that were written down in the involuntary commitment paperwork.  Patient absolutely denies any intention to hurt or kill anyone.  He shows understanding of the consequences to him of doing anything like that.  Denies suicidal thoughts.  Patient evidently lives with his girlfriend but it sounds like he has a good bit of extended family who are still actively involved with him.  His father was on the telephone during the entire interview that I had with the patient.  Interestingly, the father also seemed to have a somewhat paranoid although not in the psychotic since approach to the patient's care.  Medical history: Patient now is on hemodialysis.  Fairly new at it.  History of coronary artery disease heart attack strokes angina end-stage renal disease  Substance abuse history: Patient would not engage in conversation about this.  It did not appear to be immediately relevant to the concern at hand.  Past Psychiatric History: Patient tells me that when he was a child there was a time that he was interviewed by a psychiatrist and wound up being sent away to some kind of facility for a couple of years.  He refuses to recount any details of the situation.  He tells me it is for that reason that he has no trust of talking to psychiatrist.  He denies any other history of mental health or psychiatric treatment and there is nothing in the chart about any  past psychiatric concern other than one consult a few years ago mentioned previously  Risk to Self: Is patient at risk for suicide?: No Risk to Others:   Prior Inpatient Therapy:   Prior Outpatient Therapy:    Past Medical History:  Past Medical History:  Diagnosis Date  . CAD (coronary artery disease)    status post multiple myocardial infarctions  . Chronic kidney disease   . COPD (chronic obstructive pulmonary disease) ([place])   . Hyperlipidemia   . Hypertension   . Myocardial infarction ([place])    " I had 13 heart attacks, 8 massive"   . Proteinuria   . Renal insufficiency   . Secondary hyperparathyroidism of renal origin ([place])   . Shortness of breath dyspnea    " when I drink a cup of cold water too fast"  . Sleep apnea    pt stated " long story" does not wear CPAP  . Stroke [facility])     Past Surgical History:  Procedure Laterality Date  . AV FISTULA PLACEMENT Left 01-24-14   By Dr. Hortencia Pilar in [place]  . AV FISTULA PLACEMENT Right 04/30/2014   Procedure: RIGHT BRACHIOCEPHALIC ARTERIOVENOUS (AV) FISTULA CREATION;  Surgeon: Elam Dutch, MD;  Location: [facility] OR;  Service: Vascular;  Laterality: Right;  . AV FISTULA PLACEMENT Left 05/31/2014   Procedure: Left INSERTION OF ARTERIOVENOUS (AV) GORE-TEX GRAFT ARM;  Surgeon: Elam Dutch, MD;  Location: [facility];  Service: Vascular;  Laterality: Left;  . CARDIAC CATHETERIZATION    . CORONARY STENT PLACEMENT     13 stents total  . DIALYSIS/PERMA CATHETER INSERTION N/A 01/15/2017   Procedure: DIALYSIS/PERMA CATHETER INSERTION;  Surgeon: Katha Cabal, MD;  Location: [facility];  Service: Cardiovascular;  Laterality: N/A;   Family History:  Family History  Problem Relation Age of Onset  . Heart disease Mother   . Hyperlipidemia Mother   . Hypertension Mother   . Kidney disease Father   . Kidney disease Brother   . Hyperlipidemia Sister   . Hyperlipidemia Daughter   . Hypertension Sister   . Hypertension Daughter    Family Psychiatric  History: None known or identified Social History:  Social History   Substance and Sexual Activity  Alcohol Use No  . Alcohol/week: 0.0 oz  . Frequency: Never   Comment: occasional     Social History   Substance and Sexual Activity  Drug Use Yes  . Types: Marijuana   Comment: daily    Social History   Socioeconomic History  . Marital status: Single    Spouse name: None  . Number of children: None  . Years of education: None  . Highest education level: None  Social Needs  . Financial resource  strain: None  . Food insecurity - worry: None  . Food insecurity - inability: None  . Transportation needs - medical: None  . Transportation needs - non-medical: None  Occupational History  . None  Tobacco Use  . Smoking status: Current Every Day Smoker    Packs/day: 0.25    Years: 25.00    Pack years: 6.25    Types: Cigarettes  . Smokeless tobacco: Never Used  Substance and Sexual Activity  . Alcohol use: No    Alcohol/week: 0.0 oz    Frequency: Never    Comment: occasional  . Drug use: Yes    Types: Marijuana    Comment: daily  . Sexual activity: None    Comment: " Smokes weed"  Other Topics Concern  . None  Social History Narrative  . None   Additional Social History:    Allergies:   Allergies  Allergen Reactions  . Ibuprofen Other (See Comments)    Kidney   . Viagra [Sildenafil]     Lips swell   . Clonidine Derivatives Rash    Labs:  Results for orders placed or performed during the hospital encounter of 02/08/17 (from the past 48 hour(s))  Phosphorus     Status: None   Collection Time: 02/10/17  6:10 PM  Result Value Ref Range   Phosphorus 4.5 2.5 - 4.6 mg/dL  Basic metabolic panel     Status: Abnormal   Collection Time: 02/11/17  4:59 AM  Result Value Ref Range   Sodium 138 135 - 145 mmol/L   Potassium 4.3 3.5 - 5.1 mmol/L   Chloride 104 101 - 111 mmol/L   CO2 26 22 - 32 mmol/L   Glucose, Bld 106 (H) 65 - 99 mg/dL   BUN 51 (H) 6 - 20 mg/dL   Creatinine, Ser 7.82 (H) 0.61 - 1.24 mg/dL   Calcium 7.5 (L) 8.9 - 10.3 mg/dL   GFR calc non Af Amer 7 (L) >60 mL/min   GFR calc Af Amer 9 (L) >60 mL/min    Comment: (NOTE) The eGFR has been calculated using the CKD EPI equation. This calculation has not been validated in all clinical situations. eGFR's persistently <60 mL/min signify possible Chronic Kidney Disease.    Anion gap 8 5 - 15    Current Facility-Administered Medications  Medication Dose Route Frequency Provider Last Rate Last Dose  . 0.9 %   sodium chloride infusion  250 mL Intravenous PRN Salary, Montell D, MD      . acetaminophen (TYLENOL) tablet 650 mg  650 mg Oral Q6H PRN Salary, Montell D, MD       Or  . acetaminophen (TYLENOL) suppository 650 mg  650 mg Rectal Q6H PRN Salary, Montell D, MD      . amLODipine (NORVASC) tablet 10 mg  10 mg Oral Daily Salary, Montell D, MD   10 mg at 02/11/17 1031  . atorvastatin (LIPITOR) tablet 40 mg  40 mg Oral q1800 Gladstone Lighter, MD   40 mg at 02/10/17 1742  . carvedilol (COREG) tablet 25 mg  25 mg Oral BID WC Lateef, Munsoor, MD   25 mg at 02/11/17 1031  . clopidogrel (PLAVIX) tablet 75 mg  75 mg Oral Daily Gladstone Lighter, MD   75 mg at 02/11/17 1031  . heparin injection 5,000 Units  5,000 Units Subcutaneous Q8H Salary, Montell D, MD   5,000 Units at 02/09/17 1418  . hydrALAZINE (APRESOLINE) injection 20 mg  20 mg Intravenous Q4H PRN Salary, Montell D, MD   20 mg at 02/10/17 1533  . hydrALAZINE (APRESOLINE) tablet 50 mg  50 mg Oral Q8H Gladstone Lighter, MD   50 mg at 02/11/17 1353  . HYDROcodone-acetaminophen (NORCO/VICODIN) 5-325 MG per tablet 1-2 tablet  1-2 tablet Oral Q4H PRN Salary, Montell D, MD      . ipratropium-albuterol (DUONEB) 0.5-2.5 (3) MG/3ML nebulizer solution 3 mL  3 mL Nebulization Q4H PRN Salary, Montell D, MD      . losartan (COZAAR) tablet 50 mg  50 mg Oral Daily Lateef, Munsoor, MD   50 mg at 02/11/17 1031  . ondansetron (ZOFRAN) tablet 4 mg  4 mg Oral Q6H PRN Salary, Avel Peace, MD       Or  .  ondansetron (ZOFRAN) injection 4 mg  4 mg Intravenous Q6H PRN Salary, Montell D, MD   4 mg at 02/09/17 0001  . polyethylene glycol (MIRALAX / GLYCOLAX) packet 17 g  17 g Oral Daily PRN Salary, Montell D, MD      . sodium chloride flush (NS) 0.9 % injection 3 mL  3 mL Intravenous Q12H Salary, Montell D, MD   3 mL at 02/09/17 2213  . sodium chloride flush (NS) 0.9 % injection 3 mL  3 mL Intravenous Q12H Salary, Montell D, MD   3 mL at 02/11/17 1032     Musculoskeletal: Strength & Muscle Tone: within normal limits Gait & Station: normal Patient leans: N/A  Psychiatric Specialty Exam: Physical Exam  Nursing note and vitals reviewed. Constitutional: He appears well-developed and well-nourished.  HENT:  Head: Normocephalic and atraumatic.  Eyes: Conjunctivae are normal. Pupils are equal, round, and reactive to light.  Neck: Normal range of motion.  Cardiovascular: Regular rhythm and normal heart sounds.  Respiratory: Effort normal and breath sounds normal. No respiratory distress.  GI: Soft. He exhibits no distension.  Musculoskeletal: Normal range of motion.  Neurological: He is alert.  Skin: Skin is warm and dry.  Psychiatric: His affect is angry. His speech is rapid and/or pressured. He is agitated. He is not aggressive and not combative. Cognition and memory are normal. He expresses inappropriate judgment. He expresses no homicidal and no suicidal ideation.    Review of Systems  Constitutional: Negative.   HENT: Negative.   Eyes: Negative.   Respiratory: Negative.   Cardiovascular: Positive for leg swelling.  Gastrointestinal: Negative.   Musculoskeletal: Negative.   Skin: Negative.   Neurological: Negative.   Psychiatric/Behavioral: Negative for depression, hallucinations, memory loss, substance abuse and suicidal ideas. The patient is not nervous/anxious and does not have insomnia.     Blood pressure (!) 153/77, pulse 65, temperature (!) 97.5 F (36.4 C), temperature source Oral, resp. rate 20, height 6' (1.829 m), weight 93 kg (205 lb), SpO2 96 %.Body mass index is 27.8 kg/m.  General Appearance: Casual  Eye Contact:  Good  Speech:  Clear and Coherent  Volume:  Increased  Mood:  Angry and Irritable  Affect:  Full Range  Thought Process:  Coherent and Goal Directed  Orientation:  Full (Time, Place, and Person)  Thought Content:  Logical and Paranoid Ideation  Suicidal Thoughts:  No  Homicidal Thoughts:  No   Memory:  Immediate;   Fair Recent;   Fair Remote;   Fair  Judgement:  Fair  Insight:  Fair  Psychomotor Activity:  Normal  Concentration:  Concentration: Fair  Recall:  [employer] of Knowledge:  Fair  Language:  Fair  Akathisia:  No  Handed:  Right  AIMS (if indicated):     Assets:  Desire for Improvement Housing Social Support  ADL's:  Intact  Cognition:  WNL  Sleep:        Treatment Plan Summary: Plan 46 year old man without a known definite past psychiatric history.  Patient presents as irritable easily angered and having a paranoid stance in the sense that it is seen in people with paranoid personality disorder.  He does not appear to me to be psychotic.  We did get to talk about the threatening statements and the patient absolutely denied multiple times having any intention to hurt anyone.  He states his only plan is to take care of his health and that he feels he is capable of doing that.  There are several things about his behavior and presentation which could be consistent with personality disorder but I do not have enough of an evaluation to make a definitive diagnosis so I will leave that aside and continue the previous estimation of his threats being adult antisocial behavior.  The patient does not have a evidence of having any mental illness.  No evidence of acute dangerousness.  Discontinue the involuntary commitment.  Patient not interested in and with no clear indication for outpatient psychiatric treatment.  Case reviewed with staff on the unit paperwork filed to reverse the IVC.  Disposition: Patient does not meet criteria for psychiatric inpatient admission. Supportive therapy provided about ongoing stressors.  Alethia Berthold, MD 02/11/2017 4:18 PM

## 2017-02-11 NOTE — Progress Notes (Signed)
[facility] [place] NAME: Fernando Salas    MR#:  [id number]  DATE OF BIRTH:  13-Aug-1970  SUBJECTIVE:   -appears calm at present. He has a long list of complaints with the way his dialysis is handled--talking with Asst director -denies any complaints to me -Made homicidal threats against staff at dialysis Center. Placed under involuntary commitment by law enforcement. -Animal nutritionist present in the room during exam and also RN.  REVIEW OF SYSTEMS:  Review of Systems  Constitutional: Negative for chills, fever and malaise/fatigue.  HENT: Negative for congestion, ear discharge, hearing loss and nosebleeds.   Eyes: Negative for blurred vision and double vision.  Respiratory: Positive for shortness of breath. Negative for cough and wheezing.   Cardiovascular: Negative for chest pain, palpitations and leg swelling.  Gastrointestinal: Negative for abdominal pain, constipation, diarrhea, nausea and vomiting.  Genitourinary: Negative for dysuria.  Musculoskeletal: Positive for myalgias. Negative for back pain.  Neurological: Negative for dizziness, speech change, focal weakness, seizures and headaches.  Psychiatric/Behavioral: Negative for depression.    DRUG ALLERGIES:   Allergies  Allergen Reactions  . Ibuprofen Other (See Comments)    Kidney   . Viagra [Sildenafil]     Lips swell   . Clonidine Derivatives Rash    VITALS:  Blood pressure (!) 143/74, pulse 65, temperature 98.2 F (36.8 C), temperature source Oral, resp. rate 20, height 6' (1.829 m), weight 93 kg (205 lb), SpO2 98 %.  PHYSICAL EXAMINATION:  Physical Exam  GENERAL:  46 y.o.-year-old patient lying in the bed with no acute distress.  EYES: Pupils equal, round, reactive to light and accommodation. No scleral icterus. Extraocular muscles intact.  HEENT: Head atraumatic, normocephalic. Oropharynx and nasopharynx clear.  NECK:  Supple, no jugular venous distention. No  thyroid enlargement, no tenderness.  LUNGS: Normal breath sounds bilaterally, no wheezing, rales,rhonchi or crepitation. No use of accessory muscles of respiration.  CARDIOVASCULAR: S1, S2 normal. No murmurs, rubs, or gallops. Right chest permacath in place ABDOMEN: Soft, nontender, nondistended. Bowel sounds present. No organomegaly or mass.  EXTREMITIES: No pedal edema, cyanosis, or clubbing.  NEUROLOGIC: Cranial nerves II through XII are intact. Muscle strength 5/5 in all extremities. Sensation intact. Gait not checked.  PSYCHIATRIC: The patient is alert and oriented x 3. Appears calm this morning SKIN: No obvious rash, lesion, or ulcer.    LABORATORY PANEL:   CBC Recent Labs  Lab 02/08/17 2029  WBC 8.1  HGB 10.8*  HCT 34.0*  PLT 235   ------------------------------------------------------------------------------------------------------------------  Chemistries  Recent Labs  Lab 02/11/17 0459  NA 138  K 4.3  CL 104  CO2 26  GLUCOSE 106*  BUN 51*  CREATININE 7.82*  CALCIUM 7.5*   ------------------------------------------------------------------------------------------------------------------  Cardiac Enzymes Recent Labs  Lab 02/08/17 2029  TROPONINI 0.09*   ------------------------------------------------------------------------------------------------------------------  RADIOLOGY:  No results found.  EKG:   Orders placed or performed during the hospital encounter of 02/08/17  . EKG 12-Lead  . EKG 12-Lead  . EKG 12-Lead  . EKG 12-Lead  . ED EKG  . ED EKG  . EKG 12-Lead  . EKG 12-Lead  . EKG    ASSESSMENT AND PLAN:   46 year old male with past medical history significant for hypertension not on any home medications, end-stage renal disease recently started on hemodialysis 3 weeks ago comes to emergency room secondary to difficulty breathing and uncontrolled blood pressure.  #1 acute respiratory distress-secondary to pulmonary edema from missing  hemodialysis  on 02/08/17 due to weather. -Appreciate nephrology consult.  [facility] for dialysis today per schedule, however did not finish treatment as permacath clotted and patient refused to be restarted on dialysis. -Currently on room air and breathing better.  2. End-stage renal disease on hemodialysis-management per nephrology.  -Patient has made some homicidal threats against the staff [facility] so no local [facility] wants to take him as an outpatient. Awaiting to hear from [facility] if they would accept the patient. If not he will be here in the hospital until outpatient dialysis disposition can be arranged. Appreciate nephrology input -Currently has a permacath. On Monday Wednesday Friday schedule.  3. Uncontrolled hypertension-did not fill his medications from recent discharge. Currently on Norvasc, Coreg and losartan. Adjust medications as needed.  -Added oral hydralazine as well  4. CAD-status post multiple stents according to patient. Currently stable. Continue his Plavix and statin  5. DVT prophylaxis-on subcutaneous heparin  6. Antisocial personality-made some depth records to her outpatient dialysis staff. Placed under commitment by law enforcement. Discussed with Education officer, museum, RN, nephrologist.  -Psychiatry has been notified. Law enforcement need to make sure that patient does not have both an at home. Patient is under involuntary commitment, psychiatric team might likely rescend the commitment. Will benefit from having a Animal nutritionist around the room though. Spoke with Dr clapacs about IVC.  Discharge plan in progress   All the records are reviewed and case discussed with Care Management/Social Workerr. Management plans discussed with the patient, family and they are in agreement.  CODE STATUS: Full code  TOTAL TIME TAKING CARE OF THIS PATIENT: 20 minutes.   POSSIBLE D/C ? , DEPENDING ON CLINICAL CONDITION.   Fritzi Mandes M.D on 02/11/2017 at 11:11  AM  Between 7am to 6pm - Pager - [phone]  After 6pm go to [email or web address] - password [email or web address]  [facility]  Office  [phone]  CC: Primary care physician; [name], MD

## 2017-02-12 DIAGNOSIS — I132 Hypertensive heart and chronic kidney disease with heart failure and with stage 5 chronic kidney disease, or end stage renal disease: Secondary | ICD-10-CM | POA: Diagnosis not present

## 2017-02-12 MED ORDER — LOSARTAN POTASSIUM 50 MG PO TABS: 50.0000 mg | ORAL_TABLET | Freq: Every day | ORAL | 0 refills | Status: DC

## 2017-02-12 MED ORDER — HYDRALAZINE HCL 50 MG PO TABS: 50.0000 mg | ORAL_TABLET | Freq: Three times a day (TID) | ORAL | 0 refills | Status: DC

## 2017-02-12 NOTE — Care Management (Signed)
[facility] liaison notified of discharge

## 2017-02-12 NOTE — Progress Notes (Signed)
Patient ID: Fernando Salas, male   DOB: 06-02-1970, 46 y.o.   MRN: [id number]  Marylen Ponto and Officer Ronnald Collum spoke with patient try to explain along with the assistant director Lucretia.  He should not agreeable at this time for any further treatment here at [facility].  Attempted to reach [facility] and [facility] for possible transfer however patient wants to leave. Hold off any further transfer and discharge patient to home.

## 2017-02-12 NOTE — Progress Notes (Signed)
Pt was set to be discharged. Officer Hill was in the room with primary RN. Pt refused to sign discharge papers and left when his mother arrived. IV and tele was removed.

## 2017-02-12 NOTE — Progress Notes (Signed)
Attempted to see patient again today.  He did not want interviewer to talk to him or to examine him.  Subsequently security officer went to see the patient and advised him that we could not provide dialysis service without assessing him.  Patient continues to decline evaluation.  Therefore at this point in time we recommend transfer to another facility where the patient will allow nephrology assessment and subsequent dialysis treatment.  Case also discussed with nursing leadership and hospitalist.

## 2017-02-12 NOTE — Progress Notes (Signed)
[place] [facility] [place] NAME: Fernando Salas    MR#:  [id number]  DATE OF BIRTH:  [birth year]  SUBJECTIVE:   -appears calm at present. He has a long list of complaints with the way his dialysis is handled- -denies any complaints to me -Made homicidal threats against staff at dialysis Center. Placed under involuntary commitment by law enforcement. -RN present in the room during exam  REVIEW OF SYSTEMS:  Review of Systems  Constitutional: Negative for chills, fever and malaise/fatigue.  HENT: Negative for congestion, ear discharge, hearing loss and nosebleeds.   Eyes: Negative for blurred vision and double vision.  Respiratory: Positive for shortness of breath. Negative for cough and wheezing.   Cardiovascular: Negative for chest pain, palpitations and leg swelling.  Gastrointestinal: Negative for abdominal pain, constipation, diarrhea, nausea and vomiting.  Genitourinary: Negative for dysuria.  Musculoskeletal: Positive for myalgias. Negative for back pain.  Neurological: Negative for dizziness, speech change, focal weakness, seizures and headaches.  Psychiatric/Behavioral: Negative for depression.    DRUG ALLERGIES:   Allergies  Allergen Reactions  . Ibuprofen Other (See Comments)    Kidney   . Viagra [Sildenafil]     Lips swell   . Clonidine Derivatives Rash    VITALS:  Blood pressure (!) 147/92, pulse 67, temperature 97.6 F (36.4 C), temperature source Axillary, resp. rate 18, height 6' (1.829 m), weight 94.8 kg (209 lb 1.6 oz), SpO2 99 %.  PHYSICAL EXAMINATION:  Physical Exam  GENERAL:  46 y.o.-year-old patient lying in the bed with no acute distress.  EYES: Pupils equal, round, reactive to light and accommodation. No scleral icterus. Extraocular muscles intact.  HEENT: Head atraumatic, normocephalic. Oropharynx and nasopharynx clear.  NECK:  Supple, no jugular venous distention. No thyroid enlargement, no tenderness.  LUNGS:  Normal breath sounds bilaterally, no wheezing, rales,rhonchi or crepitation. No use of accessory muscles of respiration.  CARDIOVASCULAR: S1, S2 normal. No murmurs, rubs, or gallops. Right chest permacath in place ABDOMEN: Soft, nontender, nondistended. Bowel sounds present. No organomegaly or mass.  EXTREMITIES: No pedal edema, cyanosis, or clubbing.  NEUROLOGIC: Cranial nerves II through XII are intact. Muscle strength 5/5 in all extremities. Sensation intact. Gait not checked.  PSYCHIATRIC: The patient is alert and oriented x 3. Appears calm this morning SKIN: No obvious rash, lesion, or ulcer.    LABORATORY PANEL:   CBC Recent Labs  Lab 02/08/17 2029  WBC 8.1  HGB 10.8*  HCT 34.0*  PLT 235   ------------------------------------------------------------------------------------------------------------------  Chemistries  Recent Labs  Lab 02/11/17 0459  NA 138  K 4.3  CL 104  CO2 26  GLUCOSE 106*  BUN 51*  CREATININE 7.82*  CALCIUM 7.5*   ------------------------------------------------------------------------------------------------------------------  Cardiac Enzymes Recent Labs  Lab 02/08/17 2029  TROPONINI 0.09*   ------------------------------------------------------------------------------------------------------------------  RADIOLOGY:  No results found.  EKG:   Orders placed or performed during the hospital encounter of 02/08/17  . EKG 12-Lead  . EKG 12-Lead  . EKG 12-Lead  . EKG 12-Lead  . ED EKG  . ED EKG  . EKG 12-Lead  . EKG 12-Lead  . EKG    ASSESSMENT AND PLAN:   46 year old male with past medical history significant for hypertension not on any home medications, end-stage renal disease recently started on hemodialysis 3 weeks ago comes to emergency room secondary to difficulty breathing and uncontrolled blood pressure.  #1 acute respiratory distress-secondary to pulmonary edema from missing hemodialysis on 02/08/17 due to  weather. -  Appreciate nephrology consult.  [facility] for dialysis today per schedule, however did not finish treatment as permacath clotted and patient refused to be restarted on dialysis. -Currently on room air and breathing better.  2. End-stage renal disease on hemodialysis-management per nephrology.  -Patient has made some homicidal threats against the staff [facility] so no local [facility] wants to take him as an outpatient. Awaiting to hear from [place] if they would accept the patient. If not he will be here in the hospital until outpatient dialysis disposition can be arranged. Appreciate nephrology input -Currently has a permacath. On Monday Wednesday Friday schedule. -Patient declined to see Dr. Holley Raring and evaluate him this morning.  This puts [place] in a difficult situation and dialysis orders cannot be placed this morning.  Spoke with administration and they are working with patient and has security involved to see if patient is agreeable to have Dr. Holley Raring examined him. -pt wants to go to [facility].  Left detailed message for [facility] to call me back to see if they can take him as transfer for hemodialysis -Outpatient dialysis has not been established and has been very difficult so far given patient's behavior and personality.  3. Uncontrolled hypertension-did not fill his medications from recent discharge. Currently on Norvasc, Coreg and losartan. Adjust medications as needed.  -Added oral hydralazine as well  4. CAD-status post multiple stents according to patient. Currently stable. Continue his Plavix and statin  5. DVT prophylaxis-on subcutaneous heparin  6. Antisocial personality-made some depth records to her outpatient dialysis staff. Placed under commitment by law enforcement. Discussed with Education officer, museum, RN, nephrologist.  -Psychiatry has been notified. Law enforcement need to make sure that patient does not have both an at home. Patient is under involuntary commitment,  psychiatric team might likely rescend the commitment. Will benefit from having a Animal nutritionist around the room though. Spoke with Dr clapacs about IVC.  Discharge plan in progress   All the records are reviewed and case discussed with Care Management/Social Workerr. Management plans discussed with the patient, family and they are in agreement.  CODE STATUS: Full code  TOTAL TIME TAKING CARE OF THIS PATIENT: 20 minutes.   POSSIBLE D/C ? , DEPENDING ON CLINICAL CONDITION.   Fritzi Mandes M.D on 02/12/2017 at 10:31 AM  Between 7am to 6pm - Pager - [phone]  After 6pm go to [email or web address] - password [email or web address]  [facility]  Office  [phone]  CC: Primary care physician; [name], MD

## 2017-02-12 NOTE — Discharge Summary (Signed)
[place] at [place] NAME: Fernando Salas    MR#:  [id number]  DATE OF BIRTH:  05-25-1970  DATE OF ADMISSION:  02/08/2017 ADMITTING PHYSICIAN: Avel Peace Salary, MD  DATE OF DISCHARGE: 02/12/17  PRIMARY CARE PHYSICIAN: [name], MD    ADMISSION DIAGNOSIS:  End stage renal disease on dialysis ([place]) [N18.6, Z99.2] Acute on chronic systolic congestive heart failure (HCC) [I50.23] Hypertension, unspecified type [I10]  DISCHARGE DIAGNOSIS:  Acute respiratory distress secondary to pulmonary edema--- patient now on room air End-stage renal disease on hemodialysis Adult antisocial behavior HTN SECONDARY DIAGNOSIS:   Past Medical History:  Diagnosis Date  . CAD (coronary artery disease)    status post multiple myocardial infarctions  . Chronic kidney disease   . COPD (chronic obstructive pulmonary disease) ([place])   . Hyperlipidemia   . Hypertension   . Myocardial infarction ([place])    " I had 13 heart attacks, 8 massive"  . Proteinuria   . Renal insufficiency   . Secondary hyperparathyroidism of renal origin ([place])   . Shortness of breath dyspnea    " when I drink a cup of cold water too fast"  . Sleep apnea    pt stated " long story" does not wear CPAP  . Stroke [facility])     HOSPITAL COURSE:   46 year old male with past medical history significant for hypertension not on any home medications, end-stage renal disease recently started on hemodialysis 3 weeks ago comes to emergency room secondary to difficulty breathing and uncontrolled blood pressure.  #1 acute respiratory distress-secondary to pulmonary edema from missing hemodialysis on 02/08/17 due to weather. -Appreciate nephrology consult.  [facility] for dialysis today per schedule, however did not finish treatment as permacath clotted and patient refused to be restarted on dialysis. -Currently on room air and breathing better.  2. End-stage renal disease on  hemodialysis-management per nephrology.  -Patient has made some homicidal threats against the staff at [facility] so no local [facility] wants to take him as an outpatient. Awaiting to hear from [place] if they would accept the patient -Currently has a permacath. On Monday Wednesday Friday schedule. -Patient declined to see Dr. Holley Raring and evaluate him this morning.  This puts [place] in a difficult situation and dialysis orders cannot be placed this morning.  Spoke with administration and they are working with patient and has security involved to see if patient is agreeable to have Dr. Holley Raring examined him. -Administration and officer Fernando Salas spoke with patient along with assistant director Lucretia at this morning patient declines evaluation by Dr. Holley Raring this morning further after lengthy discussion.  At this point patient will be discharged from the hospital.  He does not have outpatient dialysis set up made given his noncompliance here in the hospital. -Since we are discharging the patient I have canceled  attempted transverse to [facility] and was gone for now   3. Uncontrolled hypertension-did not fill his medications from recent discharge. Currently on Norvasc, Coreg and losartan. Adjust medications as needed.  -Added oral hydralazine as well  4. CAD-status post multiple stents according to patient. Currently stable. Continue his Plavix and statin  5. DVT prophylaxis-on subcutaneous heparin  6. Antisocial personality-made some depth records to her outpatient dialysis staff. Placed under commitment by law enforcement. Discussed with Education officer, museum, RN, nephrologist.  -Psychiatry has been notified.  Patient's IVC has been discontinued by psychiatry    CONSULTS OBTAINED:  Treatment Team:  Anthonette Legato,  MD Clapacs, Madie Reno, MD  DRUG ALLERGIES:   Allergies  Allergen Reactions  . Ibuprofen Other (See Comments)    Kidney   . Viagra [Sildenafil]     Lips swell   . Clonidine  Derivatives Rash    DISCHARGE MEDICATIONS:   Allergies as of 02/12/2017      Reactions   Ibuprofen Other (See Comments)   Kidney    Viagra [sildenafil]    Lips swell    Clonidine Derivatives Rash      Medication List    STOP taking these medications   isosorbide-hydrALAZINE 20-37.5 MG tablet Commonly known as:  BIDIL     TAKE these medications   amLODipine 10 MG tablet Commonly known as:  NORVASC Take 10 mg by mouth daily.   aspirin 81 MG EC tablet Take 1 tablet (81 mg total) by mouth daily.   atorvastatin 40 MG tablet Commonly known as:  LIPITOR Take 1 tablet (40 mg total) by mouth daily.   carvedilol 25 MG tablet Commonly known as:  COREG Take 25 mg by mouth 2 (two) times daily.   clopidogrel 75 MG tablet Commonly known as:  PLAVIX Take 1 tablet (75 mg total) by mouth daily.   ferrous gluconate 324 MG tablet Commonly known as:  FERGON Take 1 tablet (324 mg total) daily with breakfast by mouth.   fluticasone 50 MCG/ACT nasal spray Commonly known as:  FLONASE INSTILL 1 SPRAY PER NARES DAILY   hydrALAZINE 50 MG tablet Commonly known as:  APRESOLINE Take 1 tablet (50 mg total) by mouth every 8 (eight) hours.   losartan 50 MG tablet Commonly known as:  COZAAR Take 1 tablet (50 mg total) by mouth daily.   multivitamin tablet Take 1 tablet daily by mouth.   nitroGLYCERIN 0.4 MG SL tablet Commonly known as:  NITROSTAT Place 0.4 mg under the tongue every 5 (five) minutes as needed for chest pain.       If you experience worsening of your admission symptoms, develop shortness of breath, life threatening emergency, suicidal or homicidal thoughts you must seek medical attention immediately by calling 911 or calling your MD immediately  if symptoms less severe.  You Must read complete instructions/literature along with all the possible adverse reactions/side effects for all the Medicines you take and that have been prescribed to you. Take any new Medicines  after you have completely understood and accept all the possible adverse reactions/side effects.   Please note  You were cared for by a hospitalist during your hospital stay. If you have any questions about your discharge medications or the care you received while you were in the hospital after you are discharged, you can call the unit and asked to speak with the hospitalist on call if the hospitalist that took care of you is not available. Once you are discharged, your primary care physician will handle any further medical issues. Please note that NO REFILLS for any discharge medications will be authorized once you are discharged, as it is imperative that you return to your primary care physician (or establish a relationship with a primary care physician if you do not have one) for your aftercare needs so that they can reassess your need for medications and monitor your lab values. Today   SUBJECTIVE   irritable  VITAL SIGNS:  Blood pressure (!) 147/92, pulse 67, temperature 97.6 F (36.4 C), temperature source Axillary, resp. rate 18, height 6' (1.829 m), weight 94.8 kg (209 lb 1.6 oz), SpO2 99 %.  I/O:    Intake/Output Summary (Last 24 hours) at 02/12/2017 1125 Last data filed at 02/12/2017 0000 Gross per 24 hour  Intake 1200 ml  Output 0 ml  Net 1200 ml    PHYSICAL EXAMINATION:  GENERAL:  46 y.o.-year-old patient lying in the bed with no acute distress.  EYES: Pupils equal, round, reactive to light and accommodation. No scleral icterus. Extraocular muscles intact.  HEENT: Head atraumatic, normocephalic. Oropharynx and nasopharynx clear.  NECK:  Supple, no jugular venous distention. No thyroid enlargement, no tenderness.  LUNGS: Normal breath sounds bilaterally, no wheezing, rales,rhonchi or crepitation. No use of accessory muscles of respiration.  CARDIOVASCULAR: S1, S2 normal. No murmurs, rubs, or gallops.  ABDOMEN: Soft, non-tender, non-distended. Bowel sounds present. No  organomegaly or mass.  EXTREMITIES: No pedal edema, cyanosis, or clubbing.  NEUROLOGIC: Cranial nerves II through XII are intact. Muscle strength 5/5 in all extremities. Sensation intact. Gait not checked.  PSYCHIATRIC: The patient is alert and oriented x 3.  SKIN: No obvious rash, lesion, or ulcer.   DATA REVIEW:   CBC  Recent Labs  Lab 02/08/17 2029  WBC 8.1  HGB 10.8*  HCT 34.0*  PLT 235    Chemistries  Recent Labs  Lab 02/11/17 0459  NA 138  K 4.3  CL 104  CO2 26  GLUCOSE 106*  BUN 51*  CREATININE 7.82*  CALCIUM 7.5*    Microbiology Results   No results found for this or any previous visit (from the past 240 hour(s)).  RADIOLOGY:  No results found.   Management plans discussed with the patient, family and they are in agreement.  CODE STATUS:     Code Status Orders  (From admission, onward)        Start     Ordered   02/08/17 2338  Full code  Continuous     02/08/17 2337    Code Status History    Date Active Date Inactive Code Status Order ID Comments User Context   01/12/2017 09:54 01/16/2017 16:03 Full Code [id number]  Loletha Grayer, MD ED   11/01/2016 03:29 11/02/2016 15:25 Full Code [id number]  Hugelmeyer, Ubaldo Glassing, DO Inpatient      TOTAL TIME TAKING CARE OF THIS PATIENT: 40 minutes.    Fritzi Mandes M.D on 02/12/2017 at 11:25 AM  Between 7am to 6pm - Pager - [phone] After 6pm go to [email or web address] - password [email or web address]  [facility]  Office  [phone]  CC: Primary care physician; [name], MD

## 2017-03-08 ENCOUNTER — Other Ambulatory Visit: Payer: Self-pay

## 2017-03-08 ENCOUNTER — Emergency Department: Payer: Medicare Other

## 2017-03-08 ENCOUNTER — Inpatient Hospital Stay
Admission: EM | Admit: 2017-03-08 | Discharge: 2017-03-11 | DRG: 189 | Disposition: A | Discharge status: Home / Self Care | Payer: Medicare Other | Attending: Internal Medicine | Admitting: Internal Medicine

## 2017-03-08 DIAGNOSIS — J811 Chronic pulmonary edema: Secondary | ICD-10-CM | POA: Diagnosis present

## 2017-03-08 DIAGNOSIS — I252 Old myocardial infarction: Secondary | ICD-10-CM

## 2017-03-08 DIAGNOSIS — Z72811 Adult antisocial behavior: Secondary | ICD-10-CM | POA: Diagnosis not present

## 2017-03-08 DIAGNOSIS — Z888 Allergy status to other drugs, medicaments and biological substances status: Secondary | ICD-10-CM

## 2017-03-08 DIAGNOSIS — N2581 Secondary hyperparathyroidism of renal origin: Secondary | ICD-10-CM | POA: Diagnosis present

## 2017-03-08 DIAGNOSIS — I160 Hypertensive urgency: Secondary | ICD-10-CM | POA: Diagnosis not present

## 2017-03-08 DIAGNOSIS — J449 Chronic obstructive pulmonary disease, unspecified: Secondary | ICD-10-CM | POA: Diagnosis not present

## 2017-03-08 DIAGNOSIS — Z886 Allergy status to analgesic agent status: Secondary | ICD-10-CM

## 2017-03-08 DIAGNOSIS — Z8673 Personal history of transient ischemic attack (TIA), and cerebral infarction without residual deficits: Secondary | ICD-10-CM | POA: Diagnosis not present

## 2017-03-08 DIAGNOSIS — G4730 Sleep apnea, unspecified: Secondary | ICD-10-CM | POA: Diagnosis present

## 2017-03-08 DIAGNOSIS — Z7902 Long term (current) use of antithrombotics/antiplatelets: Secondary | ICD-10-CM | POA: Diagnosis not present

## 2017-03-08 DIAGNOSIS — Z955 Presence of coronary angioplasty implant and graft: Secondary | ICD-10-CM | POA: Diagnosis not present

## 2017-03-08 DIAGNOSIS — I248 Other forms of acute ischemic heart disease: Secondary | ICD-10-CM | POA: Diagnosis present

## 2017-03-08 DIAGNOSIS — Z992 Dependence on renal dialysis: Secondary | ICD-10-CM

## 2017-03-08 DIAGNOSIS — Z79899 Other long term (current) drug therapy: Secondary | ICD-10-CM | POA: Diagnosis not present

## 2017-03-08 DIAGNOSIS — R778 Other specified abnormalities of plasma proteins: Secondary | ICD-10-CM

## 2017-03-08 DIAGNOSIS — F17210 Nicotine dependence, cigarettes, uncomplicated: Secondary | ICD-10-CM | POA: Diagnosis not present

## 2017-03-08 DIAGNOSIS — I2510 Atherosclerotic heart disease of native coronary artery without angina pectoris: Secondary | ICD-10-CM | POA: Diagnosis not present

## 2017-03-08 DIAGNOSIS — I132 Hypertensive heart and chronic kidney disease with heart failure and with stage 5 chronic kidney disease, or end stage renal disease: Secondary | ICD-10-CM | POA: Diagnosis present

## 2017-03-08 DIAGNOSIS — J810 Acute pulmonary edema: Secondary | ICD-10-CM | POA: Diagnosis present

## 2017-03-08 DIAGNOSIS — R7989 Other specified abnormal findings of blood chemistry: Secondary | ICD-10-CM

## 2017-03-08 DIAGNOSIS — J9600 Acute respiratory failure, unspecified whether with hypoxia or hypercapnia: Secondary | ICD-10-CM | POA: Diagnosis not present

## 2017-03-08 DIAGNOSIS — Z9115 Patient's noncompliance with renal dialysis: Secondary | ICD-10-CM | POA: Diagnosis not present

## 2017-03-08 DIAGNOSIS — E785 Hyperlipidemia, unspecified: Secondary | ICD-10-CM | POA: Diagnosis not present

## 2017-03-08 DIAGNOSIS — D631 Anemia in chronic kidney disease: Secondary | ICD-10-CM | POA: Diagnosis present

## 2017-03-08 DIAGNOSIS — I509 Heart failure, unspecified: Secondary | ICD-10-CM | POA: Diagnosis not present

## 2017-03-08 DIAGNOSIS — N186 End stage renal disease: Secondary | ICD-10-CM | POA: Diagnosis not present

## 2017-03-08 LAB — CBC
HEMATOCRIT: 34.4 % — AB (ref 40.0–52.0)
Hemoglobin: 11.1 g/dL — ABNORMAL LOW (ref 13.0–18.0)
MCH: 29.6 pg (ref 26.0–34.0)
MCHC: 32.1 g/dL (ref 32.0–36.0)
MCV: 92.3 fL (ref 80.0–100.0)
Platelets: 206 10*3/uL (ref 150–440)
RBC: 3.73 MIL/uL — AB (ref 4.40–5.90)
RDW: 15.8 % — ABNORMAL HIGH (ref 11.5–14.5)
WBC: 5.2 10*3/uL (ref 3.8–10.6)

## 2017-03-08 MED ORDER — ALBUTEROL SULFATE (2.5 MG/3ML) 0.083% IN NEBU: 5.0000 mg | INHALATION_SOLUTION | Freq: Once | RESPIRATORY_TRACT | Status: DC

## 2017-03-08 NOTE — ED Triage Notes (Signed)
Pt arrives to ED via [facility] from home with c/o [facility]. Pt is a dialysis pt, states he has missed the last 2 appointments and has not had dialysis since last Wednesday (03/03/2017). Pt denies any c/o pain, just reports SHOB. EMS reports pt with deminished breath sounds in bilateral lower lung fields. Pt reports taking (1) SL Nitro PTA. EMS reported VS: BP 206/127, 94% O2 on RA. Pt normally gets dialysis on M/W/F schedule.

## 2017-03-09 ENCOUNTER — Other Ambulatory Visit: Payer: Self-pay

## 2017-03-09 ENCOUNTER — Encounter: Payer: Self-pay | Admitting: Internal Medicine

## 2017-03-09 DIAGNOSIS — I2510 Atherosclerotic heart disease of native coronary artery without angina pectoris: Secondary | ICD-10-CM | POA: Diagnosis not present

## 2017-03-09 DIAGNOSIS — Z72811 Adult antisocial behavior: Secondary | ICD-10-CM | POA: Diagnosis not present

## 2017-03-09 DIAGNOSIS — F17210 Nicotine dependence, cigarettes, uncomplicated: Secondary | ICD-10-CM | POA: Diagnosis not present

## 2017-03-09 DIAGNOSIS — Z79899 Other long term (current) drug therapy: Secondary | ICD-10-CM | POA: Diagnosis not present

## 2017-03-09 DIAGNOSIS — I248 Other forms of acute ischemic heart disease: Secondary | ICD-10-CM | POA: Diagnosis not present

## 2017-03-09 DIAGNOSIS — I252 Old myocardial infarction: Secondary | ICD-10-CM | POA: Diagnosis not present

## 2017-03-09 DIAGNOSIS — E785 Hyperlipidemia, unspecified: Secondary | ICD-10-CM | POA: Diagnosis not present

## 2017-03-09 DIAGNOSIS — D631 Anemia in chronic kidney disease: Secondary | ICD-10-CM | POA: Diagnosis not present

## 2017-03-09 DIAGNOSIS — G4730 Sleep apnea, unspecified: Secondary | ICD-10-CM | POA: Diagnosis not present

## 2017-03-09 DIAGNOSIS — N186 End stage renal disease: Secondary | ICD-10-CM | POA: Diagnosis not present

## 2017-03-09 DIAGNOSIS — J810 Acute pulmonary edema: Secondary | ICD-10-CM | POA: Diagnosis present

## 2017-03-09 DIAGNOSIS — J9600 Acute respiratory failure, unspecified whether with hypoxia or hypercapnia: Secondary | ICD-10-CM | POA: Diagnosis not present

## 2017-03-09 DIAGNOSIS — J449 Chronic obstructive pulmonary disease, unspecified: Secondary | ICD-10-CM | POA: Diagnosis not present

## 2017-03-09 DIAGNOSIS — Z955 Presence of coronary angioplasty implant and graft: Secondary | ICD-10-CM | POA: Diagnosis not present

## 2017-03-09 DIAGNOSIS — I160 Hypertensive urgency: Secondary | ICD-10-CM | POA: Diagnosis not present

## 2017-03-09 DIAGNOSIS — I509 Heart failure, unspecified: Secondary | ICD-10-CM | POA: Diagnosis not present

## 2017-03-09 DIAGNOSIS — Z8673 Personal history of transient ischemic attack (TIA), and cerebral infarction without residual deficits: Secondary | ICD-10-CM | POA: Diagnosis not present

## 2017-03-09 DIAGNOSIS — I132 Hypertensive heart and chronic kidney disease with heart failure and with stage 5 chronic kidney disease, or end stage renal disease: Secondary | ICD-10-CM | POA: Diagnosis not present

## 2017-03-09 DIAGNOSIS — Z886 Allergy status to analgesic agent status: Secondary | ICD-10-CM | POA: Diagnosis not present

## 2017-03-09 DIAGNOSIS — Z888 Allergy status to other drugs, medicaments and biological substances status: Secondary | ICD-10-CM | POA: Diagnosis not present

## 2017-03-09 DIAGNOSIS — N2581 Secondary hyperparathyroidism of renal origin: Secondary | ICD-10-CM | POA: Diagnosis not present

## 2017-03-09 DIAGNOSIS — J811 Chronic pulmonary edema: Secondary | ICD-10-CM | POA: Diagnosis present

## 2017-03-09 DIAGNOSIS — Z7902 Long term (current) use of antithrombotics/antiplatelets: Secondary | ICD-10-CM | POA: Diagnosis not present

## 2017-03-09 DIAGNOSIS — Z9115 Patient's noncompliance with renal dialysis: Secondary | ICD-10-CM | POA: Diagnosis not present

## 2017-03-09 LAB — BASIC METABOLIC PANEL
Anion gap: 11 (ref 5–15)
Anion gap: 12 (ref 5–15)
BUN: 81 mg/dL — ABNORMAL HIGH (ref 6–20)
BUN: 81 mg/dL — AB (ref 6–20)
CALCIUM: 7.9 mg/dL — AB (ref 8.9–10.3)
CHLORIDE: 106 mmol/L (ref 101–111)
CO2: 18 mmol/L — ABNORMAL LOW (ref 22–32)
CO2: 20 mmol/L — ABNORMAL LOW (ref 22–32)
CREATININE: 12.4 mg/dL — AB (ref 0.61–1.24)
CREATININE: 12.59 mg/dL — AB (ref 0.61–1.24)
Calcium: 8.3 mg/dL — ABNORMAL LOW (ref 8.9–10.3)
Chloride: 109 mmol/L (ref 101–111)
GFR calc non Af Amer: 4 mL/min — ABNORMAL LOW (ref >60)
GFR, EST AFRICAN AMERICAN: 5 mL/min — AB (ref >60)
GFR, EST AFRICAN AMERICAN: 5 mL/min — AB (ref >60)
GFR, EST NON AFRICAN AMERICAN: 4 mL/min — AB (ref >60)
Glucose, Bld: 118 mg/dL — ABNORMAL HIGH (ref 65–99)
Glucose, Bld: 79 mg/dL (ref 65–99)
Potassium: 4.2 mmol/L (ref 3.5–5.1)
Potassium: 4.6 mmol/L (ref 3.5–5.1)
SODIUM: 138 mmol/L (ref 135–145)
SODIUM: 138 mmol/L (ref 135–145)

## 2017-03-09 LAB — CBC
HCT: 31 % — ABNORMAL LOW (ref 40.0–52.0)
Hemoglobin: 9.9 g/dL — ABNORMAL LOW (ref 13.0–18.0)
MCH: 29.4 pg (ref 26.0–34.0)
MCHC: 31.9 g/dL — ABNORMAL LOW (ref 32.0–36.0)
MCV: 92.2 fL (ref 80.0–100.0)
PLATELETS: 188 10*3/uL (ref 150–440)
RBC: 3.36 MIL/uL — AB (ref 4.40–5.90)
RDW: 15.3 % — ABNORMAL HIGH (ref 11.5–14.5)
WBC: 5.8 10*3/uL (ref 3.8–10.6)

## 2017-03-09 LAB — TROPONIN I
TROPONIN I: 0.06 ng/mL — AB (ref <0.03)
TROPONIN I: 0.06 ng/mL — AB (ref <0.03)
Troponin I: 0.06 ng/mL (ref <0.03)
Troponin I: 0.05 ng/mL (ref <0.03)

## 2017-03-09 MED ORDER — ONDANSETRON HCL 4 MG PO TABS: 4.0000 mg | ORAL_TABLET | Freq: Four times a day (QID) | ORAL | Status: DC | PRN

## 2017-03-09 MED ORDER — ADULT MULTIVITAMIN W/MINERALS CH
1.0000 | ORAL_TABLET | Freq: Every day | ORAL | Status: DC
Start: 2017-03-09 — End: 2017-03-11
  Administered 2017-03-10 – 2017-03-11 (×2): 1 via ORAL
  Filled 2017-03-09 (×2): qty 1

## 2017-03-09 MED ORDER — LOSARTAN POTASSIUM 50 MG PO TABS
50.0000 mg | ORAL_TABLET | Freq: Every day | ORAL | Status: DC
  Administered 2017-03-10 – 2017-03-11 (×2): 50 mg via ORAL
  Filled 2017-03-09 (×2): qty 1

## 2017-03-09 MED ORDER — SODIUM CHLORIDE 0.9 % IV SOLN: 250.0000 mL | INTRAVENOUS | Status: DC | PRN

## 2017-03-09 MED ORDER — ATORVASTATIN CALCIUM 20 MG PO TABS
40.0000 mg | ORAL_TABLET | Freq: Every day | ORAL | Status: DC
  Administered 2017-03-10 – 2017-03-11 (×2): 40 mg via ORAL
  Filled 2017-03-09 (×2): qty 2

## 2017-03-09 MED ORDER — ACETAMINOPHEN 325 MG PO TABS: 650.0000 mg | ORAL_TABLET | Freq: Four times a day (QID) | ORAL | Status: DC | PRN

## 2017-03-09 MED ORDER — FERROUS GLUCONATE 324 (38 FE) MG PO TABS
324.0000 mg | ORAL_TABLET | Freq: Every day | ORAL | Status: DC
  Administered 2017-03-11: 324 mg via ORAL
  Filled 2017-03-09 (×3): qty 1

## 2017-03-09 MED ORDER — ACETAMINOPHEN 650 MG RE SUPP: 650.0000 mg | Freq: Four times a day (QID) | RECTAL | Status: DC | PRN

## 2017-03-09 MED ORDER — SODIUM CHLORIDE 0.9% FLUSH: 3.0000 mL | INTRAVENOUS | Status: DC | PRN

## 2017-03-09 MED ORDER — SENNOSIDES-DOCUSATE SODIUM 8.6-50 MG PO TABS: 1.0000 | ORAL_TABLET | Freq: Every evening | ORAL | Status: DC | PRN

## 2017-03-09 MED ORDER — LABETALOL HCL 5 MG/ML IV SOLN
10.0000 mg | Freq: Once | INTRAVENOUS | Status: AC
  Administered 2017-03-09: 10 mg via INTRAVENOUS
  Filled 2017-03-09: qty 4

## 2017-03-09 MED ORDER — CARVEDILOL 25 MG PO TABS
25.0000 mg | ORAL_TABLET | Freq: Two times a day (BID) | ORAL | Status: DC
  Administered 2017-03-09 – 2017-03-11 (×4): 25 mg via ORAL
  Filled 2017-03-09 (×4): qty 1

## 2017-03-09 MED ORDER — HEPARIN SODIUM (PORCINE) 5000 UNIT/ML IJ SOLN
5000.0000 [IU] | Freq: Three times a day (TID) | INTRAMUSCULAR | Status: DC
  Filled 2017-03-09 (×2): qty 1

## 2017-03-09 MED ORDER — CLOPIDOGREL BISULFATE 75 MG PO TABS
75.0000 mg | ORAL_TABLET | Freq: Every day | ORAL | Status: DC
  Administered 2017-03-10 – 2017-03-11 (×2): 75 mg via ORAL
  Filled 2017-03-09 (×2): qty 1

## 2017-03-09 MED ORDER — ASPIRIN EC 81 MG PO TBEC
81.0000 mg | DELAYED_RELEASE_TABLET | Freq: Every day | ORAL | Status: DC
  Administered 2017-03-10 – 2017-03-11 (×2): 81 mg via ORAL
  Filled 2017-03-09 (×2): qty 1

## 2017-03-09 MED ORDER — SODIUM CHLORIDE 0.9% FLUSH
3.0000 mL | Freq: Two times a day (BID) | INTRAVENOUS | Status: DC
  Administered 2017-03-09 – 2017-03-11 (×4): 3 mL via INTRAVENOUS

## 2017-03-09 MED ORDER — NITROGLYCERIN 0.4 MG SL SUBL: 0.4000 mg | SUBLINGUAL_TABLET | SUBLINGUAL | Status: DC | PRN

## 2017-03-09 MED ORDER — FUROSEMIDE 10 MG/ML IJ SOLN
80.0000 mg | Freq: Once | INTRAMUSCULAR | Status: AC
  Administered 2017-03-09: 80 mg via INTRAVENOUS
  Filled 2017-03-09: qty 8

## 2017-03-09 MED ORDER — ONDANSETRON HCL 4 MG/2ML IJ SOLN: 4.0000 mg | Freq: Four times a day (QID) | INTRAMUSCULAR | Status: DC | PRN

## 2017-03-09 MED ORDER — HYDRALAZINE HCL 20 MG/ML IJ SOLN
10.0000 mg | INTRAMUSCULAR | Status: DC | PRN
  Administered 2017-03-09 – 2017-03-10 (×3): 10 mg via INTRAVENOUS
  Filled 2017-03-09 (×3): qty 1

## 2017-03-09 MED ORDER — FLUTICASONE PROPIONATE 50 MCG/ACT NA SUSP
1.0000 | Freq: Every day | NASAL | Status: DC
  Filled 2017-03-09: qty 16

## 2017-03-09 MED ORDER — AMLODIPINE BESYLATE 10 MG PO TABS
10.0000 mg | ORAL_TABLET | Freq: Every day | ORAL | Status: DC
  Administered 2017-03-10 – 2017-03-11 (×2): 10 mg via ORAL
  Filled 2017-03-09 (×2): qty 1

## 2017-03-09 MED ORDER — HYDRALAZINE HCL 50 MG PO TABS
50.0000 mg | ORAL_TABLET | Freq: Three times a day (TID) | ORAL | Status: DC
  Administered 2017-03-09 – 2017-03-11 (×6): 50 mg via ORAL
  Filled 2017-03-09 (×6): qty 1

## 2017-03-09 NOTE — Progress Notes (Signed)
Pre hd 

## 2017-03-09 NOTE — Progress Notes (Signed)
Pre HD  

## 2017-03-09 NOTE — Progress Notes (Signed)
Admitted for shortness of breath, missed dialysis on Friday and Monday.  Chest x-ray showed pulmonary edema.  Noted to have severely elevated blood pressure up to 200/130 Labs reviewed reviewed and medications reviewed.  Patient is getting hemodialysis.  1.  Acute respiratory failure and pulmonary edema in the context of ESRD and missed hemodialysis sessions: Patient is ESRD patient on hemodialysis, patient is getting hemodialysis now.  Not on  outpatient clinic appointment secondary to his behavioral problems.  2.  Hypertensive urgency: On amlodipine, Coreg, hydralazine, losartan,, blood pressure better.  Received IV furosemide in the emergency room.  3.. ESRD on hemodialysis Monday, Wednesday, Friday COPD: No wheezing. 4.  Angioedema due to occluded 5.  History of stroke: Continue aspirin, Plavix. Time spent 30 minutes.

## 2017-03-09 NOTE — Care Management (Signed)
Patient discharged from [facility] in December 2018 and was not accepted by any outpatient dialysis clinic in [facility] due to making a threat to take a gun to dialysis clinics and shoot everyone.He was evaluated by psych during that admission for homicidal ideation.  Fernando Salas with [facility] informed CM that when patient left [facility], [facility] and got admitted.  He is admitted this day for pulmonary edema due to noncompliance with dialysis.

## 2017-03-09 NOTE — H&P (Signed)
[facility] NAME: Fernando Salas    MR#:  [id number]  DATE OF BIRTH:  04-Jun-1970  DATE OF ADMISSION:  03/08/2017  PRIMARY CARE PHYSICIAN: [name], MD   REQUESTING/REFERRING PHYSICIAN:   CHIEF COMPLAINT:   Chief Complaint  Patient presents with  . Shortness of Breath    HISTORY OF PRESENT ILLNESS: Fernando Salas  is a 47 y.o. male with a known history of coronary artery disease, COPD, end-stage renal disease on dialysis, hypertension, hyperlipidemia, sleep apnea presented to the emergency room with increased shortness of breath for the last 1 day.  Patient missed dialysis on Friday and Monday.  No complaints of any chest pain.  He gets dialyzed on Monday Wednesday Friday.  Patient was evaluated in the emergency room chest x-ray showed pulmonary edema he was given Lasix for diuresis.  Blood pressure has been elevated systolic more than 546 millimeters of mercury and diastolic more than 270 mmHg.  Hospitalist service was consulted for further care.  PAST MEDICAL HISTORY:   Past Medical History:  Diagnosis Date  . CAD (coronary artery disease)    status post multiple myocardial infarctions  . Chronic kidney disease   . COPD (chronic obstructive pulmonary disease) ([facility])   . Hyperlipidemia   . Hypertension   . Myocardial infarction ([place])    " I had 13 heart attacks, 8 massive"  . Proteinuria   . Renal insufficiency   . Secondary hyperparathyroidism of renal origin ([place])   . Shortness of breath dyspnea    " when I drink a cup of cold water too fast"  . Sleep apnea    pt stated " long story" does not wear CPAP  . Stroke [facility])     PAST SURGICAL HISTORY:  Past Surgical History:  Procedure Laterality Date  . AV FISTULA PLACEMENT Left 01-24-14   By Dr. Hortencia Pilar in [place]  . AV FISTULA PLACEMENT Right 04/30/2014   Procedure: RIGHT BRACHIOCEPHALIC ARTERIOVENOUS (AV) FISTULA CREATION;  Surgeon: Elam Dutch,  MD;  Location: [facility] OR;  Service: Vascular;  Laterality: Right;  . AV FISTULA PLACEMENT Left 05/31/2014   Procedure: Left INSERTION OF ARTERIOVENOUS (AV) GORE-TEX GRAFT ARM;  Surgeon: Elam Dutch, MD;  Location: [facility];  Service: Vascular;  Laterality: Left;  . CARDIAC CATHETERIZATION    . CORONARY STENT PLACEMENT     13 stents total  . DIALYSIS/PERMA CATHETER INSERTION N/A 01/15/2017   Procedure: DIALYSIS/PERMA CATHETER INSERTION;  Surgeon: Katha Cabal, MD;  Location: [facility];  Service: Cardiovascular;  Laterality: N/A;    SOCIAL HISTORY:  Social History   Tobacco Use  . Smoking status: Current Every Day Smoker    Packs/day: 0.25    Years: 25.00    Pack years: 6.25    Types: Cigarettes  . Smokeless tobacco: Never Used  Substance Use Topics  . Alcohol use: No    Alcohol/week: 0.0 oz    Frequency: Never    Comment: occasional    FAMILY HISTORY:  Family History  Problem Relation Age of Onset  . Heart disease Mother   . Hyperlipidemia Mother   . Hypertension Mother   . Kidney disease Father   . Kidney disease Brother   . Hyperlipidemia Sister   . Hyperlipidemia Daughter   . Hypertension Sister   . Hypertension Daughter     DRUG ALLERGIES:  Allergies  Allergen Reactions  . Ibuprofen Other (See Comments)  Kidney   . Viagra [Sildenafil]     Lips swell   . Clonidine Derivatives Rash    REVIEW OF SYSTEMS:   CONSTITUTIONAL: No fever, fatigue or weakness.  EYES: No blurred or double vision.  EARS, NOSE, AND THROAT: No tinnitus or ear pain.  RESPIRATORY: No cough, has shortness of breath,  No wheezing or hemoptysis.  CARDIOVASCULAR: No chest pain, orthopnea, edema.  GASTROINTESTINAL: No nausea, vomiting, diarrhea or abdominal pain.  GENITOURINARY: No dysuria, hematuria.  ENDOCRINE: No polyuria, nocturia,  HEMATOLOGY: No anemia, easy bruising or bleeding SKIN: No rash or lesion. MUSCULOSKELETAL: No joint pain or arthritis.   NEUROLOGIC: No  tingling, numbness, weakness.  PSYCHIATRY: No anxiety or depression.   MEDICATIONS AT HOME:  Prior to Admission medications   Medication Sig Start Date End Date Taking? Authorizing Provider  amLODipine (NORVASC) 10 MG tablet Take 10 mg by mouth daily.    [provider]  aspirin EC 81 MG EC tablet Take 1 tablet (81 mg total) by mouth daily. 11/03/16   Demetrios Loll, MD  atorvastatin (LIPITOR) 40 MG tablet Take 1 tablet (40 mg total) by mouth daily. 11/03/16   Demetrios Loll, MD  carvedilol (COREG) 25 MG tablet Take 25 mg by mouth 2 (two) times daily. 02/19/14   [provider]  clopidogrel (PLAVIX) 75 MG tablet Take 1 tablet (75 mg total) by mouth daily. 11/03/16   Demetrios Loll, MD  ferrous gluconate (FERGON) 324 MG tablet Take 1 tablet (324 mg total) daily with breakfast by mouth. 01/17/17   Epifanio Lesches, MD  fluticasone (FLONASE) 50 MCG/ACT nasal spray INSTILL 1 SPRAY PER NARES DAILY 01/14/16   [provider]  hydrALAZINE (APRESOLINE) 50 MG tablet Take 1 tablet (50 mg total) by mouth every 8 (eight) hours. 02/12/17   Fritzi Mandes, MD  losartan (COZAAR) 50 MG tablet Take 1 tablet (50 mg total) by mouth daily. 02/12/17   Fritzi Mandes, MD  Multiple Vitamin (MULTIVITAMIN) tablet Take 1 tablet daily by mouth.    [provider]  nitroGLYCERIN (NITROSTAT) 0.4 MG SL tablet Place 0.4 mg under the tongue every 5 (five) minutes as needed for chest pain.    [provider]      PHYSICAL EXAMINATION:   VITAL SIGNS: Blood pressure (!) 184/124, pulse 68, temperature 98.2 F (36.8 C), temperature source Oral, resp. rate 18, height 6' (1.829 m), weight 93.4 kg (206 lb), SpO2 98 %.  GENERAL:  47 y.o.-year-old patient lying in the bed with no acute distress.  EYES: Pupils equal, round, reactive to light and accommodation. No scleral icterus. Extraocular muscles intact.  HEENT: Head atraumatic, normocephalic. Oropharynx and nasopharynx clear.  NECK:  Supple, no jugular  venous distention. No thyroid enlargement, no tenderness.  LUNGS: Decreased breath sounds bilaterally, bilateral crepitations heard. No use of accessory muscles of respiration.  CARDIOVASCULAR: S1, S2 normal. No murmurs, rubs, or gallops.  ABDOMEN: Soft, nontender, nondistended. Bowel sounds present. No organomegaly or mass.  EXTREMITIES: No pedal edema, cyanosis, or clubbing.  NEUROLOGIC: Cranial nerves II through XII are intact. Muscle strength 5/5 in all extremities. Sensation intact. Gait not checked.  PSYCHIATRIC: The patient is alert and oriented x 3.  SKIN: No obvious rash, lesion, or ulcer.   LABORATORY PANEL:   CBC Recent Labs  Lab 03/08/17 2330  WBC 5.2  HGB 11.1*  HCT 34.4*  PLT 206  MCV 92.3  MCH 29.6  MCHC 32.1  RDW 15.8*   ------------------------------------------------------------------------------------------------------------------  Chemistries  Recent Labs  Lab 03/08/17 2330  NA 138  K 4.2  CL 106  CO2 20*  GLUCOSE 79  BUN 81*  CREATININE 12.59*  CALCIUM 8.3*   ------------------------------------------------------------------------------------------------------------------ estimated creatinine clearance is 8.7 mL/min (A) (by C-G formula based on SCr of 12.59 mg/dL (H)). ------------------------------------------------------------------------------------------------------------------ No results for input(s): TSH, T4TOTAL, T3FREE, THYROIDAB in the last 72 hours.  Invalid input(s): FREET3   Coagulation profile No results for input(s): INR, PROTIME in the last 168 hours. ------------------------------------------------------------------------------------------------------------------- No results for input(s): DDIMER in the last 72 hours. -------------------------------------------------------------------------------------------------------------------  Cardiac Enzymes Recent Labs  Lab 03/08/17 2330  TROPONINI 0.06*    ------------------------------------------------------------------------------------------------------------------ Invalid input(s): POCBNP  ---------------------------------------------------------------------------------------------------------------  Urinalysis    Component Value Date/Time   COLORURINE YELLOW (A) 01/12/2017 0848   APPEARANCEUR CLEAR (A) 01/12/2017 0848   APPEARANCEUR Clear 01/17/2014 1139   LABSPEC 1.010 01/12/2017 0848   LABSPEC 1.015 01/17/2014 1139   PHURINE 5.0 01/12/2017 0848   GLUCOSEU 50 (A) 01/12/2017 0848   GLUCOSEU Negative 01/17/2014 1139   HGBUR SMALL (A) 01/12/2017 0848   BILIRUBINUR NEGATIVE 01/12/2017 0848   BILIRUBINUR Negative 01/17/2014 1139   KETONESUR NEGATIVE 01/12/2017 0848   PROTEINUR >=300 (A) 01/12/2017 0848   NITRITE NEGATIVE 01/12/2017 0848   LEUKOCYTESUR NEGATIVE 01/12/2017 0848   LEUKOCYTESUR Negative 01/17/2014 1139     RADIOLOGY: Dg Chest 2 View  Result Date: 03/08/2017 CLINICAL DATA:  Shortness of breath.  Missed dialysis. EXAM: CHEST  2 VIEW COMPARISON:  02/08/2017 FINDINGS: Right-sided dialysis catheter remains in place tip in the SVC. Unchanged cardiomegaly. Mild vascular congestion. Persistent but improved pulmonary edema. Fluid in the fissures without large subpulmonic effusion. No confluent consolidation. No pneumothorax. No acute osseous abnormalities. IMPRESSION: Cardiomegaly and vascular congestion. Persistent but improved pulmonary edema from prior exam. Findings suggest mild CHF. Electronically Signed   By: Jeb Levering M.D.   On: 03/08/2017 23:41    EKG: Orders placed or performed during the hospital encounter of 03/08/17  . EKG 12-Lead  . EKG 12-Lead  . ED EKG  . ED EKG    IMPRESSION AND PLAN: 47 year old male patient with history of end-stage renal disease on dialysis, hypertension, COPD, hyperlipidemia, sleep apnea, coronary artery disease presented to the emergency room with increased shortness of  breath.  Patient missed dialysis twice in the last 1 week.  Admitting diagnosis 1.  Acute pulmonary edema 2.  Dyspnea 3.  Hypertensive urgency 4.  End-stage renal disease on dialysis 5.  Emphysema 6.  Sleep apnea  Treatment plan : Admit patient to telemetry Start patient on beta-blocker, losartan and oral hydralazine for control of blood pressure PRN IV hydralazine for better control of blood pressure If blood pressure not controlled with above, Will start patient on IV nicardipine drip. Nephrology consultation for dialysis DVT prophylaxis subcu heparin  All the records are reviewed and case discussed with ED provider. Management plans discussed with the patient, family and they are in agreement.  CODE STATUS:FULL CODE Code Status History    Date Active Date Inactive Code Status Order ID Comments User Context   02/08/2017 23:37 02/12/2017 15:57 Full Code [id number]  Gorden Harms, MD Inpatient   01/12/2017 09:54 01/16/2017 16:03 Full Code [id number]  Loletha Grayer, MD ED   11/01/2016 03:29 11/02/2016 15:25 Full Code [id number]  Hugelmeyer, Ubaldo Glassing, DO Inpatient       TOTAL TIME TAKING CARE OF THIS PATIENT: 52 minutes.    Saundra Shelling M.D on 03/09/2017 at 1:33 AM  Between 7am to 6pm - Pager -  [phone]  After 6pm go to [email or web address] - password [email or web address] [facility]  Office  [phone]  CC: Primary care physician; [name], MD

## 2017-03-09 NOTE — Progress Notes (Signed)
Post HD assessment unchanged  

## 2017-03-09 NOTE — Progress Notes (Signed)
HD initiated via R Chest HD cath. Patient would not allow dressing to be changed. No current complaints. Given warm blankets. Currently watching tv.

## 2017-03-09 NOTE — Progress Notes (Signed)
System clotted with 40 minutes remaining on dialysis. Unable to return all of venous line. Less than 100cc blood loss. Patient would not allow reset up and continuance of treatment. MD notified. All vitals stable. Patient currently without complaints. Report called to primary RN.

## 2017-03-09 NOTE — ED Provider Notes (Signed)
[facility] Emergency Department Provider Note  ____________________________________________   First MD Initiated Contact with Patient 03/09/17 0000     (approximate)  I have reviewed the triage vital signs and the nursing notes.   HISTORY  Chief Complaint Shortness of Breath    HPI Fernando Salas is a 47 y.o. male whose medical history includes end-stage renal disease on hemodialysis Mondays, Fridays as well as a history of antisocial behavior (this is clinically relevant to his history as documented below).  He presents by EMS for evaluation of worsening shortness of breath.  The onset has been gradual over the last few days and he states it is because he has missed his last 2 dialysis appointments.  He states that he has someone who takes him to [facility] for dialysis but they were not available Friday nor today (Monday).  He describes his breathing occult these as severe and exertion makes it worse.  Rest makes it a little bit better.  He denies chest pain, fever/chills, nausea, vomiting, and abdominal pain.  He is also had swelling in his legs.  He is only been on dialysis for a couple of months and still makes urine.  He has had some swelling in his legs as well.  Of note, he came to this emergency department about a month ago with similar symptoms that were actually more severe.  He was admitted but due to a disagreement with Dr. Holley Raring, it ended up becoming a bit of an issue and the patient refused to be evaluated by Dr. Holley Raring and ended up being discharged without treatment.  That was how he ended up in [place].  He also received a psychiatric evaluation at that time and was cleared by Dr. Weber Cooks; apparently he was under involuntary commitment due to some vague threats of violence he made towards the local dialysis center.  He is calm and cooperative today and recognizes that he needs treatment.  He states he is still not happy with "that doctor I saw  before" wants to be treated here.   Past Medical History:  Diagnosis Date  . CAD (coronary artery disease)    status post multiple myocardial infarctions  . Chronic kidney disease   . COPD (chronic obstructive pulmonary disease) ([place])   . Hyperlipidemia   . Hypertension   . Myocardial infarction ([place])    " I had 13 heart attacks, 8 massive"  . Proteinuria   . Renal insufficiency   . Secondary hyperparathyroidism of renal origin ([place])   . Shortness of breath dyspnea    " when I drink a cup of cold water too fast"  . Sleep apnea    pt stated " long story" does not wear CPAP  . Stroke [facility])     Patient Active Problem List   Diagnosis Date Noted  . Pulmonary edema 03/09/2017  . 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 Fluid overload 02/08/2017  . Accelerated hypertension 01/12/2017  . AKI (acute kidney injury) ([place])   . Unstable angina ([place]) 11/01/2016    Past Surgical History:  Procedure Laterality Date  . AV FISTULA PLACEMENT Left 01-24-14   By Dr. Hortencia Pilar in [place]  . AV FISTULA PLACEMENT Right 04/30/2014   Procedure: RIGHT BRACHIOCEPHALIC ARTERIOVENOUS (AV) FISTULA CREATION;  Surgeon: Elam Dutch, MD;  Location: [place];  Service: Vascular;  Laterality: Right;  . AV FISTULA PLACEMENT Left 05/31/2014   Procedure: Left INSERTION OF ARTERIOVENOUS (AV) GORE-TEX GRAFT ARM;  Surgeon:  Elam Dutch, MD;  Location: [place];  Service: Vascular;  Laterality: Left;  . CARDIAC CATHETERIZATION    . CORONARY STENT PLACEMENT     13 stents total  . DIALYSIS/PERMA CATHETER INSERTION N/A 01/15/2017   Procedure: DIALYSIS/PERMA CATHETER INSERTION;  Surgeon: Katha Cabal, MD;  Location: [place] [facility];  Service: Cardiovascular;  Laterality: N/A;    Prior to Admission medications   Medication Sig Start Date End Date Taking? Authorizing Provider  amLODipine (NORVASC) 10 MG tablet Take 10 mg by mouth daily.    [provider]  aspirin EC 81 MG EC tablet  Take 1 tablet (81 mg total) by mouth daily. 11/03/16   Demetrios Loll, MD  atorvastatin (LIPITOR) 40 MG tablet Take 1 tablet (40 mg total) by mouth daily. 11/03/16   Demetrios Loll, MD  carvedilol (COREG) 25 MG tablet Take 25 mg by mouth 2 (two) times daily. 02/19/14   [provider]  clopidogrel (PLAVIX) 75 MG tablet Take 1 tablet (75 mg total) by mouth daily. 11/03/16   Demetrios Loll, MD  ferrous gluconate (FERGON) 324 MG tablet Take 1 tablet (324 mg total) daily with breakfast by mouth. 01/17/17   Epifanio Lesches, MD  fluticasone (FLONASE) 50 MCG/ACT nasal spray INSTILL 1 SPRAY PER NARES DAILY 01/14/16   [provider]  hydrALAZINE (APRESOLINE) 50 MG tablet Take 1 tablet (50 mg total) by mouth every 8 (eight) hours. 02/12/17   Fritzi Mandes, MD  losartan (COZAAR) 50 MG tablet Take 1 tablet (50 mg total) by mouth daily. 02/12/17   Fritzi Mandes, MD  Multiple Vitamin (MULTIVITAMIN) tablet Take 1 tablet daily by mouth.    [provider]  nitroGLYCERIN (NITROSTAT) 0.4 MG SL tablet Place 0.4 mg under the tongue every 5 (five) minutes as needed for chest pain.    [provider]    Allergies Ibuprofen; Viagra [sildenafil]; and Clonidine derivatives  Family History  Problem Relation Age of Onset  . Heart disease Mother   . Hyperlipidemia Mother   . Hypertension Mother   . Kidney disease Father   . Kidney disease Brother   . Hyperlipidemia Sister   . Hyperlipidemia Daughter   . Hypertension Sister   . Hypertension Daughter     Social History Social History   Tobacco Use  . Smoking status: Current Every Day Smoker    Packs/day: 0.25    Years: 25.00    Pack years: 6.25    Types: Cigarettes  . Smokeless tobacco: Never Used  Substance Use Topics  . Alcohol use: No    Alcohol/week: 0.0 oz    Frequency: Never    Comment: occasional  . Drug use: Yes    Types: Marijuana    Comment: daily    Review of Systems Constitutional: No fever/chills Cardiovascular:  Denies chest pain. Respiratory: +shortness of breath. Gastrointestinal: No abdominal pain.  No nausea, no vomiting.  No diarrhea.   Genitourinary: Still produces urine Musculoskeletal: Negative for neck pain.  Negative for back pain.  Swelling in lower legs Integumentary: Negative for rash. Neurological: Negative for headaches, focal weakness or numbness.   ____________________________________________   PHYSICAL EXAM:  VITAL SIGNS: ED Triage Vitals  Enc Vitals Group     BP 03/08/17 2310 (!) 216/135     Pulse Rate 03/08/17 2310 73     Resp 03/08/17 2310 18     Temp 03/08/17 2310 98.2 F (36.8 C)     Temp Source 03/08/17 2310 Oral  SpO2 03/08/17 2310 99 %     Weight 03/08/17 2314 93.4 kg (206 lb)     Height 03/08/17 2314 1.829 m (6')     Head Circumference --      Peak Flow --      Pain Score 03/08/17 2310 0     Pain Loc --      Pain Edu? --      Excl. in [place]? --     Constitutional: Alert and oriented.  Generally well-appearing, no acute respiratory distress Eyes: Conjunctivae are normal.  Head: Atraumatic. Nose: No congestion/rhinnorhea. Cardiovascular: Normal rate, regular rhythm. Good peripheral circulation. Grossly normal heart sounds. Respiratory: Normal respiratory effort.  No retractions. Lungs CTAB. Gastrointestinal: Soft and nontender. No distention.  Musculoskeletal: Mild lower extremity edema. No gross deformities of extremities. Neurologic:  Normal speech and language. No gross focal neurologic deficits are appreciated.  Skin:  Skin is warm, dry and intact. No rash noted. Psychiatric: Mood and affect are normal. Speech and behavior are normal.  ____________________________________________   LABS (all labs ordered are listed, but only abnormal results are displayed)  Labs Reviewed  BASIC METABOLIC PANEL - Abnormal; Notable for the following components:      Result Value   CO2 20 (*)    BUN 81 (*)    Creatinine, Ser 12.59 (*)    Calcium 8.3 (*)     GFR calc non Af Amer 4 (*)    GFR calc Af Amer 5 (*)    All other components within normal limits  CBC - Abnormal; Notable for the following components:   RBC 3.73 (*)    Hemoglobin 11.1 (*)    HCT 34.4 (*)    RDW 15.8 (*)    All other components within normal limits  TROPONIN I - Abnormal; Notable for the following components:   Troponin I 0.06 (*)    All other components within normal limits   ____________________________________________  EKG  ED ECG REPORT I, Hinda Kehr, the attending physician, personally viewed and interpreted this ECG.  Date: 03/08/2017 EKG Time: 23:12 Rate: 74 Rhythm: sinus rhythm with 1st degree AV block QRS Axis: normal Intervals: normal ST/T Wave abnormalities: Non-specific ST segment / T-wave changes, but no evidence of acute ischemia. Narrative Interpretation: no evidence of acute ischemia   ____________________________________________  RADIOLOGY I, Hinda Kehr, personally viewed and evaluated these images (plain radiographs) as part of my medical decision making, as well as reviewing the written report by the radiologist.  Dg Chest 2 View  Result Date: 03/08/2017 CLINICAL DATA:  Shortness of breath.  Missed dialysis. EXAM: CHEST  2 VIEW COMPARISON:  02/08/2017 FINDINGS: Right-sided dialysis catheter remains in place tip in the SVC. Unchanged cardiomegaly. Mild vascular congestion. Persistent but improved pulmonary edema. Fluid in the fissures without large subpulmonic effusion. No confluent consolidation. No pneumothorax. No acute osseous abnormalities. IMPRESSION: Cardiomegaly and vascular congestion. Persistent but improved pulmonary edema from prior exam. Findings suggest mild CHF. Electronically Signed   By: Jeb Levering M.D.   On: 03/08/2017 23:41    ____________________________________________   PROCEDURES  Critical Care performed: Yes, see critical care procedure note(s)   Procedure(s) performed:   .Critical Care Performed  by: Hinda Kehr, MD Authorized by: Hinda Kehr, MD   Critical care provider statement:    Critical care time (minutes):  30   Critical care time was exclusive of:  Separately billable procedures and treating other patients   Critical care was necessary to treat or prevent imminent  or life-threatening deterioration of the following conditions:  Renal failure   Critical care was time spent personally by me on the following activities:  Development of treatment plan with patient or surrogate, discussions with consultants, evaluation of patient's response to treatment, examination of patient, obtaining history from patient or surrogate, ordering and performing treatments and interventions, ordering and review of laboratory studies, ordering and review of radiographic studies, pulse oximetry, re-evaluation of patient's condition and review of old charts     ____________________________________________   INITIAL IMPRESSION / ASSESSMENT AND PLAN / ED COURSE  As part of my medical decision making, I reviewed the following data within the [place] notes reviewed and incorporated, Labs reviewed , EKG interpreted , Radiograph reviewed , Discussed with admitting physician (Dr. Estanislado Pandy) and A phone consult was requested and obtained from this/these consultant(s) (nephrology, Dr. Juleen China).    Differential includes, but is not limited to, viral syndrome, bronchitis including COPD exacerbation, pneumonia, reactive airway disease including asthma, CHF including exacerbation with or without pulmonary/interstitial edema, pneumothorax, ACS, thoracic trauma, and pulmonary embolism.  However for this patient it seems pretty clear he is having a CHF exacerbation secondary to not getting dialysis.  He has pulmonary edema and hypertensive urgency with a systolic blood pressure consistently greater than 200.  I will give him labetalol as was effective for him before.  I discussed the  issue he had before with being treated here and he states that he does want treatment at this time and agrees to be cared for by the providers that are present even though he still says he does not like the nephrologist he saw before which according to records was Dr. Holley Raring.  Dr. Juleen China is on tonight and I will call and discuss the case with him.  Believe that the troponin elevation is demand ischemia and secondary to his end-stage renal disease and is not representative of ACS.  Clinical Course as of Mar 09 134  Tue Mar 09, 2017  9702 I spoke by phone with Dr. Juleen China and discussed the case.  I explained that this time the patient is very calm and cooperative and agrees to treatment here at [facility].  At this point I do not have a compelling reason to call [facility] for transfer as we have services that we can provide here and the patient is not specifically requesting transfer.  I explained this to Dr. Juleen China and he agrees to the plan of Lasix, labetalol for hypertension, and dialysis in the morning.  Updated patient, reiterated the importance of working with the staff and providers in the hospital to permit them to care for him.  He claims that he understands and agrees with the plan of care.   [CF]  [phone] Paged hospitalist for admission.  Patient is getting labetalol and Lasix.  [CF]  0102 Spoke with Dr. Estanislado Pandy who will admit  [CF]    Clinical Course User Index [CF] Hinda Kehr, MD    ____________________________________________  FINAL CLINICAL IMPRESSION(S) / ED DIAGNOSES  Final diagnoses:  ESRD on hemodialysis ([place])  Acute on chronic congestive heart failure, unspecified heart failure type ([place])  Acute pulmonary edema ([facility])  Hypertensive urgency  Elevated troponin I level     MEDICATIONS GIVEN DURING THIS VISIT:  Medications  amLODipine (NORVASC) tablet 10 mg (not administered)  carvedilol (COREG) tablet 25 mg (25 mg Oral Given 03/09/17 0136)  hydrALAZINE (APRESOLINE) tablet 50 mg (50  mg Oral Given 03/09/17 0136)  hydrALAZINE (APRESOLINE) injection  10 mg (10 mg Intravenous Given 03/09/17 0127)  labetalol (NORMODYNE,TRANDATE) injection 10 mg (10 mg Intravenous Given 03/09/17 0053)  furosemide (LASIX) injection 80 mg (80 mg Intravenous Given 03/09/17 0052)     ED Discharge Orders    None       Note:  This document was prepared using Dragon voice recognition software and may include unintentional dictation errors.    Hinda Kehr, MD 03/09/17 [phone]

## 2017-03-09 NOTE — Progress Notes (Signed)
Pt BP 171/92. Doctor Vianne Bulls was notified and ordered togive the PRN hydralazine injection 10 mg. Will continue to monitor.

## 2017-03-09 NOTE — Progress Notes (Signed)
Pt BP after administration of PRN hydralazine 10 mg was at 163/85 was taken after 30 minutes of administration. Initial BP was at 171/92 at 1724. Doctor Gouru was notified and no new order was place. Will continue to monitor.

## 2017-03-09 NOTE — Progress Notes (Signed)
[facility]  ROUNDING NOTE   Subjective:   [name] admitted to [facility] on 03/08/2017 for Acute pulmonary edema ([place]) [J81.0] Hypertensive urgency [I16.0] Elevated troponin I level [R74.8] ESRD on hemodialysis ([place]) [N18.6, Z99.2] Acute on chronic congestive heart failure, unspecified heart failure type ([place]) [I50.9]   Given IV furosemide in ED.   Patient complains of shortness of breath. He is asking for dialysis. He seems apologetic about previous behavior.   Objective:  Vital signs in last 24 hours:  Temp:  [98 F (36.7 C)-98.3 F (36.8 C)] 98.3 F (36.8 C) (01/08 0731) Pulse Rate:  [65-77] 68 (01/08 0731) Resp:  [17-18] 17 (01/08 0731) BP: (154-216)/(83-141) 158/83 (01/08 0731) SpO2:  [90 %-100 %] 90 % (01/08 0731) Weight:  [93.4 kg (206 lb)-94.4 kg (208 lb 3.2 oz)] 94.4 kg (208 lb 3.2 oz) (01/08 0226)  Weight change:  Filed Weights   03/08/17 2314 03/09/17 0226  Weight: 93.4 kg (206 lb) 94.4 kg (208 lb 3.2 oz)    Intake/Output: I/O last 3 completed shifts: In: 9 [P.O.:120; I.V.:3] Out: 950 [Urine:950]   Intake/Output this shift:  Total I/O In: 240 [P.O.:240] Out: -   Physical Exam: General: NAD, laying in bed  Head: Normocephalic, atraumatic. Moist oral mucosal membranes  Eyes: Anicteric, PERRL  Neck: Supple, trachea midline  Lungs:  Bilateral crackles  Heart: Regular rate and rhythm  Abdomen:  Soft, nontender  Extremities:  no peripheral edema.  Neurologic: Nonfocal, moving all four extremities  Skin: No lesions  Access: Clotted left AVG, RIJ permcath    Basic Metabolic Panel: Recent Labs  Lab 03/08/17 2330 03/09/17 0547  NA 138 138  K 4.2 4.6  CL 106 109  CO2 20* 18*  GLUCOSE 79 118*  BUN 81* 81*  CREATININE 12.59* 12.40*  CALCIUM 8.3* 7.9*    Liver Function Tests: No results for input(s): AST, ALT, ALKPHOS, BILITOT, PROT, ALBUMIN in the last 168 hours. No results for input(s): LIPASE, AMYLASE in the last 168  hours. No results for input(s): AMMONIA in the last 168 hours.  CBC: Recent Labs  Lab 03/08/17 2330 03/09/17 0547  WBC 5.2 5.8  HGB 11.1* 9.9*  HCT 34.4* 31.0*  MCV 92.3 92.2  PLT 206 188    Cardiac Enzymes: Recent Labs  Lab 03/08/17 2330 03/09/17 0547  TROPONINI 0.06* 0.06*    BNP: Invalid input(s): POCBNP  CBG: No results for input(s): GLUCAP in the last 168 hours.  Microbiology: Results for orders placed or performed during the hospital encounter of 01/12/17  MRSA PCR Screening     Status: None   Collection Time: 01/12/17 11:22 AM  Result Value Ref Range Status   MRSA by PCR NEGATIVE NEGATIVE Final    Comment:        The GeneXpert MRSA Assay (FDA approved for NASAL specimens only), is one component of a comprehensive MRSA colonization surveillance program. It is not intended to diagnose MRSA infection nor to guide or monitor treatment for MRSA infections.     Coagulation Studies: No results for input(s): LABPROT, INR in the last 72 hours.  Urinalysis: No results for input(s): COLORURINE, LABSPEC, PHURINE, GLUCOSEU, HGBUR, BILIRUBINUR, KETONESUR, PROTEINUR, UROBILINOGEN, NITRITE, LEUKOCYTESUR in the last 72 hours.  Invalid input(s): APPERANCEUR    Imaging: Dg Chest 2 View  Result Date: 03/08/2017 CLINICAL DATA:  Shortness of breath.  Missed dialysis. EXAM: CHEST  2 VIEW COMPARISON:  02/08/2017 FINDINGS: Right-sided dialysis catheter remains in place tip in the SVC.  Unchanged cardiomegaly. Mild vascular congestion. Persistent but improved pulmonary edema. Fluid in the fissures without large subpulmonic effusion. No confluent consolidation. No pneumothorax. No acute osseous abnormalities. IMPRESSION: Cardiomegaly and vascular congestion. Persistent but improved pulmonary edema from prior exam. Findings suggest mild CHF. Electronically Signed   By: Jeb Levering M.D.   On: 03/08/2017 23:41     Medications:   . sodium chloride     . amLODipine  10  mg Oral Daily  . aspirin EC  81 mg Oral Daily  . atorvastatin  40 mg Oral Daily  . carvedilol  25 mg Oral BID  . clopidogrel  75 mg Oral Daily  . ferrous gluconate  324 mg Oral Q breakfast  . fluticasone  1 spray Each Nare Daily  . heparin  5,000 Units Subcutaneous Q8H  . hydrALAZINE  50 mg Oral Q8H  . losartan  50 mg Oral Daily  . multivitamin with minerals  1 tablet Oral Daily  . sodium chloride flush  3 mL Intravenous Q12H   sodium chloride, acetaminophen **OR** acetaminophen, hydrALAZINE, nitroGLYCERIN, ondansetron **OR** ondansetron (ZOFRAN) IV, senna-docusate, sodium chloride flush  Assessment/ Plan:  [name] is a 47 y.o. 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  ESRD on HD: no outpatient clinic currently due to behavior problems. Has been getting treatments at [facility] for today. With pulmonary edema. Orders prepared.   2.  Anemia of chronic kidney disease.  Hemoglobin 9.9. Hold epo for now.   3. Hypertension: elevated.  - amlodipine, carvedilol, hydralazine, losartan  4.  Secondary hyperparathyroidism.  PTH 545 on 02/09/17 - not currently on a binder.    LOS: 0 Yvonne Stopher 1/8/201912:00 PM

## 2017-03-09 NOTE — ED Notes (Signed)
Dr Karma Greaser made aware of pt's elevated Troponin level as reported by lab at this time: 0.06ng/mL

## 2017-03-10 DIAGNOSIS — J810 Acute pulmonary edema: Secondary | ICD-10-CM | POA: Diagnosis not present

## 2017-03-10 LAB — HEPATITIS B SURFACE ANTIBODY,QUALITATIVE: Hep B S Ab: NONREACTIVE

## 2017-03-10 LAB — HEPATITIS B CORE ANTIBODY, TOTAL: Hep B Core Total Ab: NEGATIVE

## 2017-03-10 LAB — HEPATITIS B SURFACE ANTIGEN: Hepatitis B Surface Ag: NEGATIVE

## 2017-03-10 MED ORDER — EPOETIN ALFA 10000 UNIT/ML IJ SOLN: 4000.0000 [IU] | Freq: Once | INTRAMUSCULAR | Status: DC

## 2017-03-10 MED ORDER — HEPARIN SODIUM (PORCINE) 1000 UNIT/ML DIALYSIS
100.0000 [IU]/kg | INTRAMUSCULAR | Status: DC | PRN
  Filled 2017-03-10: qty 9

## 2017-03-10 MED ORDER — LABETALOL HCL 5 MG/ML IV SOLN: 10.0000 mg | INTRAVENOUS | Status: DC | PRN

## 2017-03-10 MED ORDER — HYDRALAZINE HCL 20 MG/ML IJ SOLN: 10.0000 mg | INTRAMUSCULAR | Status: DC | PRN

## 2017-03-10 NOTE — Progress Notes (Signed)
[facility]  ROUNDING NOTE   Subjective:   Hemodialysis treatment yesterday. Treatment cut short due to clotted blood lines. Patient refused to remain on treatment.  UF of 2.9 liters.   Patient feels he needs another treatment today.   Objective:  Vital signs in last 24 hours:  Temp:  [97.6 F (36.4 C)-98.4 F (36.9 C)] 98 F (36.7 C) (01/09 2878) Pulse Rate:  [62-69] 64 (01/09 0826) Resp:  [11-21] 14 (01/09 0826) BP: (153-173)/(82-101) 153/88 (01/09 0826) SpO2:  [96 %-100 %] 98 % (01/09 0826) Weight:  [89.2 kg (196 lb 11.2 oz)-91.9 kg (202 lb 9.6 oz)] 89.2 kg (196 lb 11.2 oz) (01/09 6767)  Weight change: 0.859 kg (1 lb 14.3 oz) Filed Weights   03/09/17 1215 03/09/17 1511 03/10/17 0613  Weight: 94.3 kg (207 lb 14.3 oz) 91.9 kg (202 lb 9.6 oz) 89.2 kg (196 lb 11.2 oz)    Intake/Output: I/O last 3 completed shifts: In: 47 [P.O.:600; I.V.:6] Out: 2094 [Urine:1500; Other:2942]   Intake/Output this shift:  Total I/O In: 120 [P.O.:120] Out: -   Physical Exam: General: NAD, laying in bed  Head: Normocephalic, atraumatic. Moist oral mucosal membranes  Eyes: Anicteric, PERRL  Neck: Supple, trachea midline  Lungs:  Bilateral crackles  Heart: Regular rate and rhythm  Abdomen:  Soft, nontender  Extremities:  no peripheral edema.  Neurologic: Nonfocal, moving all four extremities  Skin: No lesions  Access: Clotted left AVG, RIJ permcath    Basic Metabolic Panel: Recent Labs  Lab 03/08/17 2330 03/09/17 0547  NA 138 138  K 4.2 4.6  CL 106 109  CO2 20* 18*  GLUCOSE 79 118*  BUN 81* 81*  CREATININE 12.59* 12.40*  CALCIUM 8.3* 7.9*    Liver Function Tests: No results for input(s): AST, ALT, ALKPHOS, BILITOT, PROT, ALBUMIN in the last 168 hours. No results for input(s): LIPASE, AMYLASE in the last 168 hours. No results for input(s): AMMONIA in the last 168 hours.  CBC: Recent Labs  Lab 03/08/17 2330 03/09/17 0547  WBC 5.2 5.8  HGB 11.1* 9.9*   HCT 34.4* 31.0*  MCV 92.3 92.2  PLT 206 188    Cardiac Enzymes: Recent Labs  Lab 03/08/17 2330 03/09/17 0547 03/09/17 1137 03/09/17 1758  TROPONINI 0.06* 0.06* 0.06* 0.05*    BNP: Invalid input(s): POCBNP  CBG: No results for input(s): GLUCAP in the last 168 hours.  Microbiology: Results for orders placed or performed during the hospital encounter of 01/12/17  MRSA PCR Screening     Status: None   Collection Time: 01/12/17 11:22 AM  Result Value Ref Range Status   MRSA by PCR NEGATIVE NEGATIVE Final    Comment:        The GeneXpert MRSA Assay (FDA approved for NASAL specimens only), is one component of a comprehensive MRSA colonization surveillance program. It is not intended to diagnose MRSA infection nor to guide or monitor treatment for MRSA infections.     Coagulation Studies: No results for input(s): LABPROT, INR in the last 72 hours.  Urinalysis: No results for input(s): COLORURINE, LABSPEC, PHURINE, GLUCOSEU, HGBUR, BILIRUBINUR, KETONESUR, PROTEINUR, UROBILINOGEN, NITRITE, LEUKOCYTESUR in the last 72 hours.  Invalid input(s): APPERANCEUR    Imaging: Dg Chest 2 View  Result Date: 03/08/2017 CLINICAL DATA:  Shortness of breath.  Missed dialysis. EXAM: CHEST  2 VIEW COMPARISON:  02/08/2017 FINDINGS: Right-sided dialysis catheter remains in place tip in the SVC. Unchanged cardiomegaly. Mild vascular congestion. Persistent but improved pulmonary edema. Fluid in  the fissures without large subpulmonic effusion. No confluent consolidation. No pneumothorax. No acute osseous abnormalities. IMPRESSION: Cardiomegaly and vascular congestion. Persistent but improved pulmonary edema from prior exam. Findings suggest mild CHF. Electronically Signed   By: Jeb Levering M.D.   On: 03/08/2017 23:41     Medications:   . sodium chloride     . amLODipine  10 mg Oral Daily  . aspirin EC  81 mg Oral Daily  . atorvastatin  40 mg Oral Daily  . carvedilol  25 mg Oral  BID  . clopidogrel  75 mg Oral Daily  . ferrous gluconate  324 mg Oral Q breakfast  . fluticasone  1 spray Each Nare Daily  . heparin  5,000 Units Subcutaneous Q8H  . hydrALAZINE  50 mg Oral Q8H  . losartan  50 mg Oral Daily  . multivitamin with minerals  1 tablet Oral Daily  . sodium chloride flush  3 mL Intravenous Q12H   sodium chloride, acetaminophen **OR** acetaminophen, hydrALAZINE, nitroGLYCERIN, ondansetron **OR** ondansetron (ZOFRAN) IV, senna-docusate, sodium chloride flush  Assessment/ Plan:  [name] is a 47 y.o. 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  ESRD on HD: no outpatient clinic currently due to behavior problems. Has been getting treatments at [facility] ED Hemodialysis yesterday. Cut treatment short Hemodialysis for today. With heparin to prevent intradialytic clotting. Orders prepared.   2.  Anemia of chronic kidney disease.  Hemoglobin 9.9.  - EPO with HD treatment today.   3. Hypertension: elevated- could be due to emotional lability - amlodipine, carvedilol, hydralazine, losartan  4.  Secondary hyperparathyroidism.  PTH 545 on 02/09/17 - not currently on a binder.    LOS: [place], Fernando Salas 1/9/201912:41 PM

## 2017-03-10 NOTE — Progress Notes (Signed)
Pre HD  

## 2017-03-10 NOTE — Progress Notes (Signed)
HD Tx started  

## 2017-03-10 NOTE — Progress Notes (Signed)
Post HD Assessment 

## 2017-03-10 NOTE — Plan of Care (Signed)
  No Outcome Fluid Volume: Compliance with measures to maintain balanced fluid volume will improve 03/10/2017 2023 - Reactivated by Jeri Cos, RN Health Behavior/Discharge Planning: Ability to manage health-related needs will improve 03/10/2017 2024 - Reactivated by Jeri Cos, RN

## 2017-03-10 NOTE — Progress Notes (Signed)
Patients BP 183/119, prime doc notified as patient was supposed to d/c after HD. Orders to give pt BP meds and wait 1 hour to see if BP comes down then possibly d/c. Will give and monitor.

## 2017-03-10 NOTE — Progress Notes (Signed)
Pt requested to stop Tx, MD notified

## 2017-03-10 NOTE — Discharge Summary (Signed)
Fernando Salas, is a 47 y.o. male  DOB [birth year]  MRN [id number].  Admission date:  03/08/2017  Admitting Physician  Saundra Shelling, MD  Discharge Date:  03/10/2017   Primary MD  [name], MD  Recommendations for primary care physician for things to follow:   Follow with PCP in 1 week   Admission Diagnosis  Acute pulmonary edema (HCC) [J81.0] Hypertensive urgency [I16.0] Elevated troponin I level [R74.8] ESRD on hemodialysis ([place]) [N18.6, Z99.2] Acute on chronic congestive heart failure, unspecified heart failure type ([place]) [I50.9]   Discharge Diagnosis  Acute pulmonary edema ([place]) [J81.0] Hypertensive urgency [I16.0] Elevated troponin I level [R74.8] ESRD on hemodialysis ([place]) [N18.6, Z99.2] Acute on chronic congestive heart failure, unspecified heart failure type ([place]) [I50.9]    Active Problems:   Pulmonary edema      Past Medical History:  Diagnosis Date  . CAD (coronary artery disease)    status post multiple myocardial infarctions  . Chronic kidney disease   . COPD (chronic obstructive pulmonary disease) ([place])   . Hyperlipidemia   . Hypertension   . Myocardial infarction ([place])    " I had 13 heart attacks, 8 massive"  . Proteinuria   . Renal insufficiency   . Secondary hyperparathyroidism of renal origin ([place])   . Shortness of breath dyspnea    " when I drink a cup of cold water too fast"  . Sleep apnea    pt stated " long story" does not wear CPAP  . Stroke [facility])     Past Surgical History:  Procedure Laterality Date  . AV FISTULA PLACEMENT Left 01-24-14   By Dr. Hortencia Pilar in [place]  . AV FISTULA PLACEMENT Right 04/30/2014   Procedure: RIGHT BRACHIOCEPHALIC ARTERIOVENOUS (AV) FISTULA CREATION;  Surgeon: Elam Dutch, MD;  Location: [facility] OR;  Service: Vascular;  Laterality:  Right;  . AV FISTULA PLACEMENT Left 05/31/2014   Procedure: Left INSERTION OF ARTERIOVENOUS (AV) GORE-TEX GRAFT ARM;  Surgeon: Elam Dutch, MD;  Location: [facility];  Service: Vascular;  Laterality: Left;  . CARDIAC CATHETERIZATION    . CORONARY STENT PLACEMENT     13 stents total  . DIALYSIS/PERMA CATHETER INSERTION N/A 01/15/2017   Procedure: DIALYSIS/PERMA CATHETER INSERTION;  Surgeon: Katha Cabal, MD;  Location: [facility];  Service: Cardiovascular;  Laterality: N/A;       History of present illness and  Hospital Course:     Kindly see H&P for history of present illness and admission details, please review complete Labs, Consult reports and Test reports for all details in brief  HPI  from the history and physical done on the day of admission 47 year old male patient with history of ESRD on hemodialysis Monday, Wednesday, Friday came in because of shortness of breath.  Patient got gradual worsening of shortness of breath, missed 2 hemodialysis sessions and admitted for acute respiratory failure in the context of fluid overload due to missed hemodialysis sessions.   Hospital Course  Acute pulmonary edema secondary to fluid overload from missed hemodialysis sessions: Patient received emergency hemodialysis yesterday and also requested extra dialysis session today.  Patient feels much better no shortness of breath.  After hemodialysis today he can go home and follow-up with hemodialysis needs with [facility] hemodialysis.  Case manager's help is appreciated to arrange transportation for him to go for dialysis session.  2. hypertensive urgency: Blood.  More than 200/130.  Patient received as needed IV hydralazine, started back on beta-blockers,  losartan, hydralazine, never required nicardipine drip.  Recent blood pressure 155/82, heart rate 66, temperature 98 Fahrenheit.  3. ESRD on hemodialysis, patient not accepted by any outpatient dialysis centers in [facility] secondary  to making g to take a gun and kill everyone, patient has antisocial behavior, seen by Dr. Weber Cooks from psychiatry also. #4 elevated troponin to 0.05 due to demand ischemia from #1.  No fever.   discharge Condition: Stable   Follow UP  Follow-up Information    [name], MD. Schedule an appointment as soon as possible for a visit in 2 week(s).   Specialty:  Internal Medicine Contact information: [place] [address] [phone]             Discharge Instructions  and  Discharge Medications      Allergies as of 03/10/2017      Reactions   Ibuprofen Other (See Comments)   Kidney    Viagra [sildenafil]    Lips swell    Clonidine Derivatives Rash      Medication List    TAKE these medications   amLODipine 10 MG tablet Commonly known as:  NORVASC Take 10 mg by mouth daily.   aspirin 81 MG EC tablet Take 1 tablet (81 mg total) by mouth daily.   atorvastatin 40 MG tablet Commonly known as:  LIPITOR Take 1 tablet (40 mg total) by mouth daily.   carvedilol 25 MG tablet Commonly known as:  COREG Take 25 mg by mouth 2 (two) times daily.   clopidogrel 75 MG tablet Commonly known as:  PLAVIX Take 1 tablet (75 mg total) by mouth daily.   ferrous gluconate 324 MG tablet Commonly known as:  FERGON Take 1 tablet (324 mg total) daily with breakfast by mouth.   fluticasone 50 MCG/ACT nasal spray Commonly known as:  FLONASE INSTILL 1 SPRAY PER NARES DAILY   hydrALAZINE 50 MG tablet Commonly known as:  APRESOLINE Take 1 tablet (50 mg total) by mouth every 8 (eight) hours.   isosorbide-hydrALAZINE 20-37.5 MG tablet Commonly known as:  BIDIL Take 1 tablet by mouth 3 (three) times daily.   losartan 50 MG tablet Commonly known as:  COZAAR Take 1 tablet (50 mg total) by mouth daily.   multivitamin tablet Take 1 tablet daily by mouth.   nitroGLYCERIN 0.4 MG SL tablet Commonly known as:  NITROSTAT Place 0.4 mg under the tongue every 5  (five) minutes as needed for chest pain.         Diet and Activity recommendation: See Discharge Instructions above   Consults obtained -nephrology   Major procedures and Radiology Reports - PLEASE review detailed and final reports for all details, in brief -      Dg Chest 2 View  Result Date: 03/08/2017 CLINICAL DATA:  Shortness of breath.  Missed dialysis. EXAM: CHEST  2 VIEW COMPARISON:  02/08/2017 FINDINGS: Right-sided dialysis catheter remains in place tip in the SVC. Unchanged cardiomegaly. Mild vascular congestion. Persistent but improved pulmonary edema. Fluid in the fissures without large subpulmonic effusion. No confluent consolidation. No pneumothorax. No acute osseous abnormalities. IMPRESSION: Cardiomegaly and vascular congestion. Persistent but improved pulmonary edema from prior exam. Findings suggest mild CHF. Electronically Signed   By: Jeb Levering M.D.   On: 03/08/2017 23:41   Dg Chest Port 1 View  Result Date: 02/08/2017 CLINICAL DATA:  Shortness of Breath EXAM: PORTABLE CHEST 1 VIEW COMPARISON:  01/12/2017 FINDINGS: Cardiac shadow is enlarged. Dialysis catheter is now seen in  satisfactory position. Mild central vascular congestion is noted likely related to a degree of volume overload. No focal infiltrate is seen. No sizable effusion is noted. IMPRESSION: Changes of mild congestive failure. Electronically Signed   By: Inez Catalina M.D.   On: 02/08/2017 20:49    Micro Results     No results found for this or any previous visit (from the past 240 hour(s)).     Today   Subjective:   Fernando Salas today has no headache,no chest abdominal pain,no new weakness tingling or numbness, feels much better wants to go home today.   Objective:   Blood pressure (!) 153/88, pulse 64, temperature 98 F (36.7 C), temperature source Oral, resp. rate 14, height 6' (1.829 m), weight 89.2 kg (196 lb 11.2 oz), SpO2 98 %.   Intake/Output Summary (Last 24 hours) at  03/10/2017 1334 Last data filed at 03/10/2017 1016 Gross per 24 hour  Intake 363 ml  Output 3492 ml  Net -3129 ml    Exam Awake Alert, Oriented x 3, No new F.N deficits, Normal affect [place].AT,PERRAL Supple Neck,No JVD, No cervical lymphadenopathy appriciated.  Symmetrical Chest wall movement, Good air movement bilaterally, CTAB RRR,No Gallops,Rubs or new Murmurs, No Parasternal Heave +ve B.Sounds, Abd Soft, Non tender, No organomegaly appriciated, No rebound -guarding or rigidity. No Cyanosis, Clubbing or edema, No new Rash or bruise  Data Review   CBC w Diff:  Lab Results  Component Value Date   WBC 5.8 03/09/2017   HGB 9.9 (L) 03/09/2017   HGB 13.3 01/17/2014   HCT 31.0 (L) 03/09/2017   HCT 40.0 01/17/2014   PLT 188 03/09/2017   PLT 303 01/17/2014   LYMPHOPCT 13 02/08/2017   LYMPHOPCT 33.3 03/25/2012   MONOPCT 9 02/08/2017   MONOPCT 14.5 03/25/2012   EOSPCT 0 02/08/2017   EOSPCT 1.9 03/25/2012   BASOPCT 1 02/08/2017   BASOPCT 0.7 03/25/2012    CMP:  Lab Results  Component Value Date   NA 138 03/09/2017   NA 139 01/17/2014   K 4.6 03/09/2017   K 4.7 01/17/2014   CL 109 03/09/2017   CL 111 (H) 01/17/2014   CO2 18 (L) 03/09/2017   CO2 25 01/17/2014   BUN 81 (H) 03/09/2017   BUN 35 (H) 01/17/2014   CREATININE 12.40 (H) 03/09/2017   CREATININE 3.84 (H) 01/17/2014   PROT 7.2 01/12/2017   PROT 8.2 03/24/2012   ALBUMIN 3.5 01/12/2017   ALBUMIN 3.8 03/24/2012   BILITOT 1.0 01/12/2017   BILITOT 0.2 03/24/2012   ALKPHOS 66 01/12/2017   ALKPHOS 82 03/24/2012   AST 23 01/12/2017   AST 21 03/24/2012   ALT 26 01/12/2017   ALT 19 03/24/2012  .   Total Time in preparing paper work, data evaluation and todays exam - 35 minutes  Epifanio Lesches M.D on 03/10/2017 at 1:34 PM    Note: This dictation was prepared with Dragon dictation along with smaller phrase technology. Any transcriptional errors that result from this process are unintentional.

## 2017-03-10 NOTE — Progress Notes (Addendum)
Corrected HD tx started at 1524

## 2017-03-10 NOTE — Progress Notes (Signed)
Pre HD Tx  

## 2017-03-10 NOTE — Progress Notes (Signed)
Post HD Tx  

## 2017-03-10 NOTE — Progress Notes (Signed)
Discharge after hemodialysis.

## 2017-03-11 NOTE — Progress Notes (Signed)
This RN was asked by pt to bring him ice water.  This RN went to retrieve ice water for pt and upon entering pt's room performed standard hand hygiene with alcohol rub from wall dispenser.  This RN then handed pt his cup of water to which he screamed, "Is that how y'all touch people's cups, with your hands -EXPLETIVE- all over them? I don't know where your hand has been!".  This RN assured the pt that hand hygiene was performed upon entering his room, apologized and offered to get the pt a new cup of water.  The pt then yelled, "No get your -EXPLETIVE- the -EXPLETIVE- out of my room!"

## 2017-03-11 NOTE — Care Management (Signed)
patient requiring transportation to [place].  Provided with [employer] bus voucher that will take him to [employer].  Says he can walk home from there.  Provided with brochure for [facility] as he will be receiving dialysis at [facility].  He says that [facility] is workiing on transportation for him.

## 2017-03-11 NOTE — Progress Notes (Signed)
Discharge instructions explained to pt/ verbalized an understanding/ iv and tele removed/ home meds returned to pt/ pt refused wheelchair/ ambulated off unit

## 2017-03-11 NOTE — Care Management Important Message (Signed)
Important Message  Patient Details  Name: MYSON LEVI MRN: [id number] Date of Birth: [birth year]   Medicare Important Message Given:  N/A - LOS <3 / Initial given by admissions    Katrina Stack, RN 03/11/2017, 10:14 AM

## 2017-03-21 ENCOUNTER — Emergency Department: Payer: Medicare Other

## 2017-03-21 ENCOUNTER — Encounter: Payer: Self-pay | Admitting: Emergency Medicine

## 2017-03-21 ENCOUNTER — Emergency Department
Admission: EM | Admit: 2017-03-21 | Discharge: 2017-03-21 | Disposition: A | Discharge status: Home / Self Care | Payer: Medicare Other | Attending: Emergency Medicine | Admitting: Emergency Medicine

## 2017-03-21 ENCOUNTER — Other Ambulatory Visit: Payer: Self-pay

## 2017-03-21 DIAGNOSIS — N186 End stage renal disease: Secondary | ICD-10-CM | POA: Diagnosis present

## 2017-03-21 DIAGNOSIS — Z7902 Long term (current) use of antithrombotics/antiplatelets: Secondary | ICD-10-CM | POA: Diagnosis not present

## 2017-03-21 DIAGNOSIS — F17210 Nicotine dependence, cigarettes, uncomplicated: Secondary | ICD-10-CM | POA: Insufficient documentation

## 2017-03-21 DIAGNOSIS — I120 Hypertensive chronic kidney disease with stage 5 chronic kidney disease or end stage renal disease: Secondary | ICD-10-CM | POA: Insufficient documentation

## 2017-03-21 DIAGNOSIS — I2510 Atherosclerotic heart disease of native coronary artery without angina pectoris: Secondary | ICD-10-CM | POA: Insufficient documentation

## 2017-03-21 DIAGNOSIS — R7989 Other specified abnormal findings of blood chemistry: Secondary | ICD-10-CM

## 2017-03-21 DIAGNOSIS — J449 Chronic obstructive pulmonary disease, unspecified: Secondary | ICD-10-CM | POA: Insufficient documentation

## 2017-03-21 DIAGNOSIS — Z7982 Long term (current) use of aspirin: Secondary | ICD-10-CM | POA: Insufficient documentation

## 2017-03-21 DIAGNOSIS — E875 Hyperkalemia: Secondary | ICD-10-CM

## 2017-03-21 DIAGNOSIS — Z992 Dependence on renal dialysis: Secondary | ICD-10-CM | POA: Diagnosis not present

## 2017-03-21 DIAGNOSIS — Z955 Presence of coronary angioplasty implant and graft: Secondary | ICD-10-CM | POA: Insufficient documentation

## 2017-03-21 DIAGNOSIS — Z79899 Other long term (current) drug therapy: Secondary | ICD-10-CM | POA: Diagnosis not present

## 2017-03-21 LAB — CBC WITH DIFFERENTIAL/PLATELET
BASOS PCT: 1 %
Basophils Absolute: 0.1 10*3/uL (ref 0–0.1)
Eosinophils Absolute: 0.1 10*3/uL (ref 0–0.7)
Eosinophils Relative: 3 %
HEMATOCRIT: 33.8 % — AB (ref 40.0–52.0)
Hemoglobin: 10.9 g/dL — ABNORMAL LOW (ref 13.0–18.0)
LYMPHS ABS: 1.4 10*3/uL (ref 1.0–3.6)
Lymphocytes Relative: 27 %
MCH: 29.5 pg (ref 26.0–34.0)
MCHC: 32.1 g/dL (ref 32.0–36.0)
MCV: 91.7 fL (ref 80.0–100.0)
MONO ABS: 0.8 10*3/uL (ref 0.2–1.0)
MONOS PCT: 15 %
NEUTROS ABS: 2.9 10*3/uL (ref 1.4–6.5)
Neutrophils Relative %: 54 %
Platelets: 238 10*3/uL (ref 150–440)
RBC: 3.68 MIL/uL — ABNORMAL LOW (ref 4.40–5.90)
RDW: 15.7 % — ABNORMAL HIGH (ref 11.5–14.5)
WBC: 5.4 10*3/uL (ref 3.8–10.6)

## 2017-03-21 LAB — COMPREHENSIVE METABOLIC PANEL
ALBUMIN: 3.7 g/dL (ref 3.5–5.0)
ALK PHOS: 72 U/L (ref 38–126)
ALT: 14 U/L — AB (ref 17–63)
AST: 13 U/L — ABNORMAL LOW (ref 15–41)
Anion gap: 12 (ref 5–15)
BUN: 99 mg/dL — ABNORMAL HIGH (ref 6–20)
CALCIUM: 8.8 mg/dL — AB (ref 8.9–10.3)
CHLORIDE: 112 mmol/L — AB (ref 101–111)
CO2: 18 mmol/L — AB (ref 22–32)
Creatinine, Ser: 14.2 mg/dL — ABNORMAL HIGH (ref 0.61–1.24)
GFR calc Af Amer: 4 mL/min — ABNORMAL LOW (ref >60)
GFR calc non Af Amer: 4 mL/min — ABNORMAL LOW (ref >60)
GLUCOSE: 85 mg/dL (ref 65–99)
Potassium: 5.8 mmol/L — ABNORMAL HIGH (ref 3.5–5.1)
SODIUM: 142 mmol/L (ref 135–145)
Total Bilirubin: 0.8 mg/dL (ref 0.3–1.2)
Total Protein: 7.5 g/dL (ref 6.5–8.1)

## 2017-03-21 MED ORDER — HEPARIN BOLUS VIA INFUSION
3000.0000 [IU] | Freq: Once | INTRAVENOUS | Status: DC
  Filled 2017-03-21: qty 3000

## 2017-03-21 NOTE — Progress Notes (Signed)
Pre HD assessment  

## 2017-03-21 NOTE — ED Triage Notes (Signed)
Pt presents to ED via POV with c/o missing his dialysis x 1 week. Pt states that he has started to experience edema and his "breath is getting a little funny". Pt is alert and oriented and able to speak in complete sentences at this time. Pt states he is normally a M/W/F dialysis. Last dialysis was 03/12/17.

## 2017-03-21 NOTE — ED Notes (Addendum)
Attempted to call downstairs to dialysis with no answer, spoke with Dr. Candiss Norse states he called in the dialysis nurse and it may take them a while to get here.

## 2017-03-21 NOTE — Progress Notes (Signed)
Post HD assessment  

## 2017-03-21 NOTE — ED Notes (Signed)
FIRST NURSE NOTE:  Pt has not had dialysis in over a week, due to transportation issues, pt states he is feeling like he is "swelled up" and short of breath.  Pt placed in wheelchair on arrival.  Last treatment was last Friday.  Normal days are MWF.

## 2017-03-21 NOTE — Progress Notes (Signed)
HD tx start 

## 2017-03-21 NOTE — Discharge Instructions (Addendum)
Please make an appointment to follow-up with your primary care physician within 1 week for recheck and return to the emergency department sooner for any concerns.  It was a pleasure to take care of you today, and thank you for coming to our emergency department.  If you have any questions or concerns before leaving please ask the nurse to grab me and I'm more than happy to go through your aftercare instructions again.  If you were prescribed any opioid pain medication today such as Norco, Vicodin, Percocet, morphine, hydrocodone, or oxycodone please make sure you do not drive when you are taking this medication as it can alter your ability to drive safely.  If you have any concerns once you are home that you are not improving or are in fact getting worse before you can make it to your follow-up appointment, please do not hesitate to call 911 and come back for further evaluation.  Darel Hong, MD  Results for orders placed or performed during the hospital encounter of 03/21/17  Comprehensive metabolic panel  Result Value Ref Range   Sodium 142 135 - 145 mmol/L   Potassium 5.8 (H) 3.5 - 5.1 mmol/L   Chloride 112 (H) 101 - 111 mmol/L   CO2 18 (L) 22 - 32 mmol/L   Glucose, Bld 85 65 - 99 mg/dL   BUN 99 (H) 6 - 20 mg/dL   Creatinine, Ser 14.20 (H) 0.61 - 1.24 mg/dL   Calcium 8.8 (L) 8.9 - 10.3 mg/dL   Total Protein 7.5 6.5 - 8.1 g/dL   Albumin 3.7 3.5 - 5.0 g/dL   AST 13 (L) 15 - 41 U/L   ALT 14 (L) 17 - 63 U/L   Alkaline Phosphatase 72 38 - 126 U/L   Total Bilirubin 0.8 0.3 - 1.2 mg/dL   GFR calc non Af Amer 4 (L) >60 mL/min   GFR calc Af Amer 4 (L) >60 mL/min   Anion gap 12 5 - 15  CBC with Differential  Result Value Ref Range   WBC 5.4 3.8 - 10.6 K/uL   RBC 3.68 (L) 4.40 - 5.90 MIL/uL   Hemoglobin 10.9 (L) 13.0 - 18.0 g/dL   HCT 33.8 (L) 40.0 - 52.0 %   MCV 91.7 80.0 - 100.0 fL   MCH 29.5 26.0 - 34.0 pg   MCHC 32.1 32.0 - 36.0 g/dL   RDW 15.7 (H) 11.5 - 14.5 %   Platelets 238  150 - 440 K/uL   Neutrophils Relative % 54 %   Neutro Abs 2.9 1.4 - 6.5 K/uL   Lymphocytes Relative 27 %   Lymphs Abs 1.4 1.0 - 3.6 K/uL   Monocytes Relative 15 %   Monocytes Absolute 0.8 0.2 - 1.0 K/uL   Eosinophils Relative 3 %   Eosinophils Absolute 0.1 0 - 0.7 K/uL   Basophils Relative 1 %   Basophils Absolute 0.1 0 - 0.1 K/uL   Dg Chest 2 View  Result Date: 03/08/2017 CLINICAL DATA:  Shortness of breath.  Missed dialysis. EXAM: CHEST  2 VIEW COMPARISON:  02/08/2017 FINDINGS: Right-sided dialysis catheter remains in place tip in the SVC. Unchanged cardiomegaly. Mild vascular congestion. Persistent but improved pulmonary edema. Fluid in the fissures without large subpulmonic effusion. No confluent consolidation. No pneumothorax. No acute osseous abnormalities. IMPRESSION: Cardiomegaly and vascular congestion. Persistent but improved pulmonary edema from prior exam. Findings suggest mild CHF. Electronically Signed   By: Jeb Levering M.D.   On: 03/08/2017 23:41   Dg  Chest Port 1 View  Result Date: 03/21/2017 CLINICAL DATA:  Shortness of breath.  Chronic renal failure EXAM: PORTABLE CHEST 1 VIEW COMPARISON:  March 08, 2017 FINDINGS: Central catheter tip is in the superior vena cava. No pneumothorax. There is no edema or consolidation. There is cardiac enlargement with questionable underlying pericardial effusion. Pulmonary vascularity is normal. No adenopathy. No bone lesions. IMPRESSION: Cardiomegaly with questionable underlying pericardial effusion. No edema or consolidation. Central catheter tip in superior vena cava. No pneumothorax. Electronically Signed   By: Lowella Grip III M.D.   On: 03/21/2017 11:59

## 2017-03-21 NOTE — ED Provider Notes (Signed)
-----------------------------------------   6:38 PM on 03/21/2017 -----------------------------------------  Patient was signed out to me by Dr. Mable Paris.  He was upstairs receiving dialysis, and the plan per Dr. Mable Paris was to discharge him once his dialysis was completed if he was stable.  On reassessment, the patient appears comfortable, and requested a sandwich.  He is still somewhat hypertensive but he is having no symptoms.  There is no indication for additional ED observation.  Patient is safe for discharge home.   Arta Silence, MD 03/21/17 1840

## 2017-03-21 NOTE — Progress Notes (Signed)
[facility], [place] 03/21/17  Subjective:   Patient presents to the emergency room a because he has not been dialyzed in 1 week Previously he has been going to [place] for his treatment His blood pressure today is elevated at 156/108 and he has some lower extremity edema His lab results show that his potassium is high at 5.8, BUN is high at 99 He denies any nausea, vomiting or shortness of breath  Objective:  Vital signs in last 24 hours:  Temp:  [98.2 F (36.8 C)] 98.2 F (36.8 C) (01/20 1058) Pulse Rate:  [58] 58 (01/20 1058) Resp:  [14] 14 (01/20 1058) BP: (156)/(108) 156/108 (01/20 1058) SpO2:  [99 %] 99 % (01/20 1058) Weight:  [86.2 kg (190 lb)] 86.2 kg (190 lb) (01/20 1059)  Weight change:  Filed Weights   03/21/17 1059  Weight: 86.2 kg (190 lb)    Intake/Output:   No intake or output data in the 24 hours ending 03/21/17 1245   Physical Exam: General:  No acute distress, sitting up in the bed in the ER, eating lunch  HEENT  anicteric, moist oral mucous membranes  Neck  supple  Pulm/lungs  normal breathing effort, clear to auscultation  CVS/Heart  regular rate and rhythm, no rub or gallop  Abdomen:   Soft, nontender, nondistended  Extremities:  1-2+ edema over lower legs  Neurologic:  Alert and oriented  Skin:  No acute rashes  Access:  Right IJ PermCath       Basic Metabolic Panel:  Recent Labs  Lab 03/21/17 1111  NA 142  K 5.8*  CL 112*  CO2 18*  GLUCOSE 85  BUN 99*  CREATININE 14.20*  CALCIUM 8.8*     CBC: Recent Labs  Lab 03/21/17 1111  WBC 5.4  NEUTROABS 2.9  HGB 10.9*  HCT 33.8*  MCV 91.7  PLT 238      Lab Results  Component Value Date   HEPBSAG Negative 03/09/2017   HEPBSAB Non Reactive 03/09/2017      Microbiology:  No results found for this or any previous visit (from the past 240 hour(s)).  Coagulation Studies: No results for input(s): LABPROT, INR in the last 72  hours.  Urinalysis: No results for input(s): COLORURINE, LABSPEC, PHURINE, GLUCOSEU, HGBUR, BILIRUBINUR, KETONESUR, PROTEINUR, UROBILINOGEN, NITRITE, LEUKOCYTESUR in the last 72 hours.  Invalid input(s): APPERANCEUR    Imaging: Dg Chest Port 1 View  Result Date: 03/21/2017 CLINICAL DATA:  Shortness of breath.  Chronic renal failure EXAM: PORTABLE CHEST 1 VIEW COMPARISON:  March 08, 2017 FINDINGS: Central catheter tip is in the superior vena cava. No pneumothorax. There is no edema or consolidation. There is cardiac enlargement with questionable underlying pericardial effusion. Pulmonary vascularity is normal. No adenopathy. No bone lesions. IMPRESSION: Cardiomegaly with questionable underlying pericardial effusion. No edema or consolidation. Central catheter tip in superior vena cava. No pneumothorax. Electronically Signed   By: Lowella Grip III M.D.   On: 03/21/2017 11:59     Medications:       Assessment/ Plan:  47 y.o. 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 Hyperkalemia in a patient with ESRD on HD: no outpatient clinic currently due to behavior problems. Has been getting treatments at [facility] ED 2.  Anemia of chronic kidney disease 3.  Lower extremity edema. 4.  Secondary hyperparathyroidism  Plan: Urgent hemodialysis today to  correct potassium and uremia Ultrafiltration goal of 1-1.5 kg as tolerated Slow blood flow of 250, 3-hour treatment as patient has not been dialyzed in a week    LOS: 0 Correne Lalani Candiss Norse 1/20/201912:45 PM  [place], [place]

## 2017-03-21 NOTE — ED Provider Notes (Signed)
[facility] Emergency Department Provider Note  ____________________________________________   First MD Initiated Contact with Patient 03/21/17 1109     (approximate)  I have reviewed the triage vital signs and the nursing notes.   HISTORY  Chief Complaint Dialysis   HPI Fernando Salas is a 47 y.o. male who comes to the emergency department requesting dialysis.  He receives hemodialysis normally Monday Wednesday Friday through a catheter in his right upper chest.  He has had difficulty obtaining dialysis recently as he was fired by Dr. Rolly Salter and all local dialysis centers for behavioral issues.  He was last dialyzed at our hospital 7 days ago.  He intermittently goes to [facility] for dialysis from the emergency department.  He came to the emergency department today noting gradual onset slowly progressive shortness of breath.  He is now sleeping nearly sitting up.  He reports mild lower extremity edema.  Shortness of breath is worse with exertion and improved with rest.  He denies pain.  Past Medical History:  Diagnosis Date  . CAD (coronary artery disease)    status post multiple myocardial infarctions  . Chronic kidney disease   . COPD (chronic obstructive pulmonary disease) ([place])   . Hyperlipidemia   . Hypertension   . Myocardial infarction ([place])    " I had 13 heart attacks, 8 massive"  . Proteinuria   . Renal insufficiency   . Secondary hyperparathyroidism of renal origin ([place])   . Shortness of breath dyspnea    " when I drink a cup of cold water too fast"  . Sleep apnea    pt stated " long story" does not wear CPAP  . Stroke [facility])     Patient Active Problem List   Diagnosis Date Noted  . Pulmonary edema 03/09/2017  . 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 Fluid overload 02/08/2017  . Accelerated hypertension 01/12/2017  . AKI (acute kidney injury) ([place])   . Unstable angina ([place]) 11/01/2016    Past Surgical History:  Procedure  Laterality Date  . AV FISTULA PLACEMENT Left 01-24-14   By Dr. Hortencia Pilar in [place]  . AV FISTULA PLACEMENT Right 04/30/2014   Procedure: RIGHT BRACHIOCEPHALIC ARTERIOVENOUS (AV) FISTULA CREATION;  Surgeon: Elam Dutch, MD;  Location: [facility] OR;  Service: Vascular;  Laterality: Right;  . AV FISTULA PLACEMENT Left 05/31/2014   Procedure: Left INSERTION OF ARTERIOVENOUS (AV) GORE-TEX GRAFT ARM;  Surgeon: Elam Dutch, MD;  Location: [place];  Service: Vascular;  Laterality: Left;  . CARDIAC CATHETERIZATION    . CORONARY STENT PLACEMENT     13 stents total  . DIALYSIS/PERMA CATHETER INSERTION N/A 01/15/2017   Procedure: DIALYSIS/PERMA CATHETER INSERTION;  Surgeon: Katha Cabal, MD;  Location: [place] CV LAB;  Service: Cardiovascular;  Laterality: N/A;    Prior to Admission medications   Medication Sig Start Date End Date Taking? Authorizing Provider  amLODipine (NORVASC) 10 MG tablet Take 10 mg by mouth daily.    [provider]  aspirin EC 81 MG EC tablet Take 1 tablet (81 mg total) by mouth daily. 11/03/16   Demetrios Loll, MD  atorvastatin (LIPITOR) 40 MG tablet Take 1 tablet (40 mg total) by mouth daily. 11/03/16   Demetrios Loll, MD  carvedilol (COREG) 25 MG tablet Take 25 mg by mouth 2 (two) times daily. 02/19/14   [provider]  clopidogrel (PLAVIX) 75 MG tablet Take 1 tablet (75 mg total) by mouth daily. 11/03/16  Demetrios Loll, MD  ferrous gluconate (FERGON) 324 MG tablet Take 1 tablet (324 mg total) daily with breakfast by mouth. 01/17/17   Epifanio Lesches, MD  fluticasone (FLONASE) 50 MCG/ACT nasal spray INSTILL 1 SPRAY PER NARES DAILY 01/14/16   [provider]  hydrALAZINE (APRESOLINE) 50 MG tablet Take 1 tablet (50 mg total) by mouth every 8 (eight) hours. Patient not taking: Reported on 03/09/2017 02/12/17   Fritzi Mandes, MD  isosorbide-hydrALAZINE (BIDIL) 20-37.5 MG tablet Take 1 tablet by mouth 3 (three) times daily.    [provider]  losartan (COZAAR) 50 MG tablet Take 1 tablet (50 mg total) by mouth daily. 02/12/17   Fritzi Mandes, MD  Multiple Vitamin (MULTIVITAMIN) tablet Take 1 tablet daily by mouth.    [provider]  nitroGLYCERIN (NITROSTAT) 0.4 MG SL tablet Place 0.4 mg under the tongue every 5 (five) minutes as needed for chest pain.    [provider]    Allergies Ibuprofen; Viagra [sildenafil]; and Clonidine derivatives  Family History  Problem Relation Age of Onset  . Heart disease Mother   . Hyperlipidemia Mother   . Hypertension Mother   . Kidney disease Father   . Kidney disease Brother   . Hyperlipidemia Sister   . Hyperlipidemia Daughter   . Hypertension Sister   . Hypertension Daughter     Social History Social History   Tobacco Use  . Smoking status: Current Every Day Smoker    Packs/day: 0.25    Years: 25.00    Pack years: 6.25    Types: Cigarettes  . Smokeless tobacco: Never Used  Substance Use Topics  . Alcohol use: No    Alcohol/week: 0.0 oz    Frequency: Never    Comment: occasional  . Drug use: Yes    Types: Marijuana    Comment: daily    Review of Systems Constitutional: No fever/chills Eyes: No visual changes. ENT: No sore throat. Cardiovascular: Denies chest pain. Respiratory: Positive for shortness of breath. Gastrointestinal: No abdominal pain.  No nausea, no vomiting.  No diarrhea.  No constipation. Genitourinary: Negative for dysuria. Musculoskeletal: Negative for back pain. Skin: Negative for rash. Neurological: Negative for headaches, focal weakness or numbness.   ____________________________________________   PHYSICAL EXAM:  VITAL SIGNS: ED Triage Vitals  Enc Vitals Group     BP 03/21/17 1058 (!) 156/108     Pulse Rate 03/21/17 1058 (!) 58     Resp 03/21/17 1058 14     Temp 03/21/17 1058 98.2 F (36.8 C)     Temp Source 03/21/17 1058 Oral     SpO2 03/21/17 1058 99 %     Weight 03/21/17 1059 190 lb (86.2 kg)     Height  03/21/17 1059 6' (1.829 m)     Head Circumference --      Peak Flow --      Pain Score 03/21/17 1102 0     Pain Loc --      Pain Edu? --      Excl. in [place]? --     Constitutional: Alert and oriented x4 pleasant cooperative speaks in full clear sentences no diaphoresis Eyes: PERRL EOMI. Head: Atraumatic. Nose: No congestion/rhinnorhea. Mouth/Throat: No trismus Neck: No stridor.  Able to lie completely flat with some JVD Cardiovascular: Normal rate, regular rhythm. Grossly normal heart sounds.  Good peripheral circulation. Respiratory: Normal respiratory effort.  No retractions.  Mild bibasilar crackles Gastrointestinal: Soft nontender Musculoskeletal: X equal in size trace edema bilaterally  Neurologic:  Normal speech and language. No gross focal neurologic deficits are appreciated. Skin:  Skin is warm, dry and intact. No rash noted. Psychiatric: Mood and affect are normal. Speech and behavior are normal.    ____________________________________________   DIFFERENTIAL includes but not limited to  Hyperkalemia, azotemia, fluid overload, pulmonary edema ____________________________________________   LABS (all labs ordered are listed, but only abnormal results are displayed)  Labs Reviewed  COMPREHENSIVE METABOLIC PANEL - Abnormal; Notable for the following components:      Result Value   Potassium 5.8 (*)    Chloride 112 (*)    CO2 18 (*)    BUN 99 (*)    Creatinine, Ser 14.20 (*)    Calcium 8.8 (*)    AST 13 (*)    ALT 14 (*)    GFR calc non Af Amer 4 (*)    GFR calc Af Amer 4 (*)    All other components within normal limits  CBC WITH DIFFERENTIAL/PLATELET - Abnormal; Notable for the following components:   RBC 3.68 (*)    Hemoglobin 10.9 (*)    HCT 33.8 (*)    RDW 15.7 (*)    All other components within normal limits    Lab work reviewed by me shows elevated BUN and potassium requiring hemodialysis __________________________________________  EKG  ED ECG  REPORT I, Darel Hong, the attending physician, personally viewed and interpreted this ECG.  Date: 03/21/2017 EKG Time:  Rate: 56 Rhythm: Sinus bradycardia QRS Axis: Right axis deviation Intervals: First-degree AV block ST/T Wave abnormalities: T wave inversion V6 consistent with old Narrative Interpretation: no evidence of acute ischemia  ____________________________________________  RADIOLOGY  6 reviewed by me with mild cardiomegaly otherwise negative ____________________________________________   PROCEDURES  Procedure(s) performed: no  Procedures  Critical Care performed: no  Observation: no ____________________________________________   INITIAL IMPRESSION / ASSESSMENT AND PLAN / ED COURSE  Pertinent labs & imaging results that were available during my care of the patient were reviewed by me and considered in my medical decision making (see chart for details).  The patient arrives hemodynamically stable and relatively well-appearing although he has missed dialysis for 7 days with concerns.  Blood work is pending.  EKG with no concerning features for hyperkalemia.     ----------------------------------------- 12:16 PM on 03/21/2017 ----------------------------------------- I spoke with Dr. Candiss Norse who recommends dialysis from the emergency department today and then discharge.  The patient verbalizes understanding and agree with the plan.  ____________________________________________   FINAL CLINICAL IMPRESSION(S) / ED DIAGNOSES  Final diagnoses:  Azotemia  Hyperkalemia  ESRD (end stage renal disease) ([place])      NEW MEDICATIONS STARTED DURING THIS VISIT:  New Prescriptions   No medications on file     Note:  This document was prepared using Dragon voice recognition software and may include unintentional dictation errors.     Darel Hong, MD 03/21/17 1251

## 2017-03-21 NOTE — ED Notes (Signed)
Attempted to call downstairs for the dialysis nurse with no answer.

## 2017-03-21 NOTE — ED Notes (Signed)
Pt transported to dialysis with Mickel Baas ED tech via wheelchair.

## 2017-03-21 NOTE — ED Notes (Signed)
Hard copy signed.

## 2017-03-21 NOTE — ED Notes (Signed)
Consent form signed and sent with pt to dialysis

## 2017-03-21 NOTE — Progress Notes (Signed)
HD tx end  

## 2017-03-25 ENCOUNTER — Encounter: Payer: Self-pay | Admitting: Emergency Medicine

## 2017-03-25 ENCOUNTER — Emergency Department
Admission: EM | Admit: 2017-03-25 | Discharge: 2017-03-25 | Disposition: A | Discharge status: Home / Self Care | Payer: Medicare Other | Attending: Emergency Medicine | Admitting: Emergency Medicine

## 2017-03-25 DIAGNOSIS — I120 Hypertensive chronic kidney disease with stage 5 chronic kidney disease or end stage renal disease: Secondary | ICD-10-CM | POA: Insufficient documentation

## 2017-03-25 DIAGNOSIS — Z8673 Personal history of transient ischemic attack (TIA), and cerebral infarction without residual deficits: Secondary | ICD-10-CM | POA: Diagnosis not present

## 2017-03-25 DIAGNOSIS — I2510 Atherosclerotic heart disease of native coronary artery without angina pectoris: Secondary | ICD-10-CM | POA: Diagnosis not present

## 2017-03-25 DIAGNOSIS — F17210 Nicotine dependence, cigarettes, uncomplicated: Secondary | ICD-10-CM | POA: Insufficient documentation

## 2017-03-25 DIAGNOSIS — N186 End stage renal disease: Secondary | ICD-10-CM | POA: Insufficient documentation

## 2017-03-25 DIAGNOSIS — J449 Chronic obstructive pulmonary disease, unspecified: Secondary | ICD-10-CM | POA: Diagnosis not present

## 2017-03-25 DIAGNOSIS — Z955 Presence of coronary angioplasty implant and graft: Secondary | ICD-10-CM | POA: Insufficient documentation

## 2017-03-25 DIAGNOSIS — Z7982 Long term (current) use of aspirin: Secondary | ICD-10-CM | POA: Insufficient documentation

## 2017-03-25 DIAGNOSIS — Z7902 Long term (current) use of antithrombotics/antiplatelets: Secondary | ICD-10-CM | POA: Insufficient documentation

## 2017-03-25 DIAGNOSIS — Z992 Dependence on renal dialysis: Secondary | ICD-10-CM | POA: Insufficient documentation

## 2017-03-25 DIAGNOSIS — I252 Old myocardial infarction: Secondary | ICD-10-CM | POA: Insufficient documentation

## 2017-03-25 LAB — BASIC METABOLIC PANEL
ANION GAP: 10 (ref 5–15)
BUN: 82 mg/dL — AB (ref 6–20)
CHLORIDE: 107 mmol/L (ref 101–111)
CO2: 21 mmol/L — ABNORMAL LOW (ref 22–32)
Calcium: 8.9 mg/dL (ref 8.9–10.3)
Creatinine, Ser: 12.62 mg/dL — ABNORMAL HIGH (ref 0.61–1.24)
GFR, EST AFRICAN AMERICAN: 5 mL/min — AB (ref >60)
GFR, EST NON AFRICAN AMERICAN: 4 mL/min — AB (ref >60)
Glucose, Bld: 81 mg/dL (ref 65–99)
POTASSIUM: 5.3 mmol/L — AB (ref 3.5–5.1)
SODIUM: 138 mmol/L (ref 135–145)

## 2017-03-25 LAB — CBC
HEMATOCRIT: 33.7 % — AB (ref 40.0–52.0)
HEMOGLOBIN: 11.2 g/dL — AB (ref 13.0–18.0)
MCH: 30.2 pg (ref 26.0–34.0)
MCHC: 33.3 g/dL (ref 32.0–36.0)
MCV: 90.5 fL (ref 80.0–100.0)
Platelets: 221 10*3/uL (ref 150–440)
RBC: 3.72 MIL/uL — AB (ref 4.40–5.90)
RDW: 15.9 % — ABNORMAL HIGH (ref 11.5–14.5)
WBC: 5.5 10*3/uL (ref 3.8–10.6)

## 2017-03-25 NOTE — Progress Notes (Signed)
Pre dialysis assessment 

## 2017-03-25 NOTE — Progress Notes (Signed)
Orders have been prepared for dialysis.

## 2017-03-25 NOTE — ED Notes (Signed)
To Dialysis via stretcher

## 2017-03-25 NOTE — ED Notes (Signed)
Returned from Dialysis. Pt is ready for discharge

## 2017-03-25 NOTE — Discharge Instructions (Signed)
Please return on this Saturday for reevaluation of your need for another session of hemodialysis.

## 2017-03-25 NOTE — Progress Notes (Signed)
Post treatment assessment

## 2017-03-25 NOTE — ED Provider Notes (Signed)
[facility] Emergency Department Provider Note   ____________________________________________   First MD Initiated Contact with Patient 03/25/17 1307     (approximate)  I have reviewed the triage vital signs and the nursing notes.   HISTORY  Chief Complaint Due for dialysis   HPI Fernando Salas is a 47 y.o. male the history of coronary disease, end-stage renal disease on dialysis  Patient presents for evaluation for dialysis.  Patient reports that he has to come to the ER for dialysis due to needing to have it performed today.  He did notice he had some swelling in his legs yesterday but is gone away.  No shortness of breath no headache.  No chest pain.  No fevers or chills.  No chest pain.  Was recently admitted and had emergency dialysis about 4 days ago he reports, but not have any symptoms of shortness of breath or trouble breathing today.  Reports that he follows with Dr. Holley Raring  Past Medical History:  Diagnosis Date  . CAD (coronary artery disease)    status post multiple myocardial infarctions  . Chronic kidney disease   . COPD (chronic obstructive pulmonary disease) ([place])   . Hyperlipidemia   . Hypertension   . Myocardial infarction ([place])    " I had 13 heart attacks, 8 massive"  . Proteinuria   . Renal insufficiency   . Secondary hyperparathyroidism of renal origin ([place])   . Shortness of breath dyspnea    " when I drink a cup of cold water too fast"  . Sleep apnea    pt stated " long story" does not wear CPAP  . Stroke [facility])     Patient Active Problem List   Diagnosis Date Noted  . Pulmonary edema 03/09/2017  . 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 Fluid overload 02/08/2017  . Accelerated hypertension 01/12/2017  . AKI (acute kidney injury) ([place])   . Unstable angina ([place]) 11/01/2016    Past Surgical History:  Procedure Laterality Date  . AV FISTULA PLACEMENT Left 01-24-14   By Dr. Hortencia Pilar in [place]  . AV FISTULA  PLACEMENT Right 04/30/2014   Procedure: RIGHT BRACHIOCEPHALIC ARTERIOVENOUS (AV) FISTULA CREATION;  Surgeon: Elam Dutch, MD;  Location: [facility] OR;  Service: Vascular;  Laterality: Right;  . AV FISTULA PLACEMENT Left 05/31/2014   Procedure: Left INSERTION OF ARTERIOVENOUS (AV) GORE-TEX GRAFT ARM;  Surgeon: Elam Dutch, MD;  Location: [place];  Service: Vascular;  Laterality: Left;  . CARDIAC CATHETERIZATION    . CORONARY STENT PLACEMENT     13 stents total  . DIALYSIS/PERMA CATHETER INSERTION N/A 01/15/2017   Procedure: DIALYSIS/PERMA CATHETER INSERTION;  Surgeon: Katha Cabal, MD;  Location: [place] CV LAB;  Service: Cardiovascular;  Laterality: N/A;    Prior to Admission medications   Medication Sig Start Date End Date Taking? Authorizing Provider  amLODipine (NORVASC) 10 MG tablet Take 10 mg by mouth daily.    [provider]  aspirin EC 81 MG EC tablet Take 1 tablet (81 mg total) by mouth daily. 11/03/16   Demetrios Loll, MD  atorvastatin (LIPITOR) 40 MG tablet Take 1 tablet (40 mg total) by mouth daily. 11/03/16   Demetrios Loll, MD  carvedilol (COREG) 25 MG tablet Take 25 mg by mouth 2 (two) times daily. 02/19/14   [provider]  clopidogrel (PLAVIX) 75 MG tablet Take 1 tablet (75 mg total) by mouth daily. 11/03/16   Demetrios Loll, MD  ferrous gluconate [facility])  324 MG tablet Take 1 tablet (324 mg total) daily with breakfast by mouth. 01/17/17   Epifanio Lesches, MD  fluticasone (FLONASE) 50 MCG/ACT nasal spray INSTILL 1 SPRAY PER NARES DAILY 01/14/16   [provider]  hydrALAZINE (APRESOLINE) 50 MG tablet Take 1 tablet (50 mg total) by mouth every 8 (eight) hours. Patient not taking: Reported on 03/09/2017 02/12/17   Fritzi Mandes, MD  isosorbide-hydrALAZINE (BIDIL) 20-37.5 MG tablet Take 1 tablet by mouth 3 (three) times daily.    [provider]  losartan (COZAAR) 50 MG tablet Take 1 tablet (50 mg total) by mouth daily. 02/12/17   Fritzi Mandes, MD    Multiple Vitamin (MULTIVITAMIN) tablet Take 1 tablet daily by mouth.    [provider]  nitroGLYCERIN (NITROSTAT) 0.4 MG SL tablet Place 0.4 mg under the tongue every 5 (five) minutes as needed for chest pain.    [provider]    Allergies Ibuprofen; Viagra [sildenafil]; and Clonidine derivatives  Family History  Problem Relation Age of Onset  . Heart disease Mother   . Hyperlipidemia Mother   . Hypertension Mother   . Kidney disease Father   . Kidney disease Brother   . Hyperlipidemia Sister   . Hyperlipidemia Daughter   . Hypertension Sister   . Hypertension Daughter     Social History Social History   Tobacco Use  . Smoking status: Current Every Day Smoker    Packs/day: 0.25    Years: 25.00    Pack years: 6.25    Types: Cigarettes  . Smokeless tobacco: Never Used  Substance Use Topics  . Alcohol use: No    Alcohol/week: 0.0 oz    Frequency: Never    Comment: occasional  . Drug use: Yes    Types: Marijuana    Comment: daily    Review of Systems Constitutional: No fever/chills Eyes: No visual changes. ENT: No sore throat. Cardiovascular: Denies chest pain. Respiratory: Denies shortness of breath. Gastrointestinal: No abdominal pain.  No nausea, no vomiting.  No diarrhea.  No constipation. Genitourinary: Negative for dysuria. Musculoskeletal: Negative for back pain.  Had some swelling in his lower legs yesterday but this is improved Skin: Negative for rash. Neurological: Negative for headaches, focal weakness or numbness.    ____________________________________________   PHYSICAL EXAM:  VITAL SIGNS: ED Triage Vitals [03/25/17 1045]  Enc Vitals Group     BP (!) 204/112     Pulse Rate 62     Resp 18     Temp 98.3 F (36.8 C)     Temp Source Oral     SpO2 98 %     Weight 208 lb (94.3 kg)     Height 6' (1.829 m)     Head Circumference      Peak Flow      Pain Score      Pain Loc      Pain Edu?      Excl. in [place]?      Constitutional: Alert and oriented. Well appearing and in no acute distress.  Sitting upright, eating meal tray.  No distress.  Pleasant. Eyes: Conjunctivae are normal. Head: Atraumatic. Nose: No congestion/rhinnorhea. Mouth/Throat: Mucous membranes are moist. Neck: No stridor.   Cardiovascular: Normal rate, regular rhythm. Grossly normal heart sounds.  Good peripheral circulation. Respiratory: Normal respiratory effort.  No retractions. Lungs CTAB. Gastrointestinal: Nontender. Musculoskeletal: No lower extremity tenderness Neurologic:  Normal speech and language. No gross focal neurologic deficits are appreciated.  Skin:  Skin  is warm, dry and intact. No rash noted. Psychiatric: Mood and affect are normal. Speech and behavior are normal.  ____________________________________________   LABS (all labs ordered are listed, but only abnormal results are displayed)  Labs Reviewed  BASIC METABOLIC PANEL - Abnormal; Notable for the following components:      Result Value   Potassium 5.3 (*)    CO2 21 (*)    BUN 82 (*)    Creatinine, Ser 12.62 (*)    GFR calc non Af Amer 4 (*)    GFR calc Af Amer 5 (*)    All other components within normal limits  CBC - Abnormal; Notable for the following components:   RBC 3.72 (*)    Hemoglobin 11.2 (*)    HCT 33.7 (*)    RDW 15.9 (*)    All other components within normal limits   ____________________________________________  EKG   ____________________________________________  RADIOLOGY   ____________________________________________   PROCEDURES  Procedure(s) performed: None  Procedures  Critical Care performed: No  ____________________________________________   INITIAL IMPRESSION / ASSESSMENT AND PLAN / ED COURSE  Pertinent labs & imaging results that were available during my care of the patient were reviewed by me and considered in my medical decision making (see chart for details).  Patient presents for dialysis  treatment.  Evidently is under a care plan with nephrology, discussed with Dr. Zollie Scale, he requested a CBC and metabolic panel and then will dialysis performed today.  Clinical Course as of Mar 25 1525  Thu Mar 25, 2017  1326 Case and care discussed with Dr. Holley Raring.  He requested a CBC and metabolic panel be performed.  Discussed patient's elevated blood pressure, and Dr. Holley Raring advised no treatment at present for his blood pressure he would like to be able to review his labs and set up dialysis before any treatment recommendation is planned.  Patient asymptomatic eating comfortably in the hallway plan for dialysis today as directed by nephrology  [MQ]  1524 Discussed with Dr. Zollie Scale, he is currently scheduling patient for dialysis this afternoon.  Patient resting comfortably, aware of the plan.  Dr. Holley Raring also advised patient should return on Saturday for his next session, discussed with patient is in agreement.  [MQ]    Clinical Course User Index [MQ] Delman Kitten, MD   Patient denies any symptoms of hypertensive emergency, no active ongoing pain or discomfort at this time.  Resting comfortably aware of the plan for dialysis to be scheduled for him today.  ----------------------------------------- 3:27 PM on 03/25/2017 -----------------------------------------  Ongoing care assigned to Dr. Kerman Passey.  Patient pending hemodialysis session, will need reevaluation post hemodialysis.  Plan is for the patient to return Saturday for another evaluation for the need for another dialysis session at that time.  ____________________________________________   FINAL CLINICAL IMPRESSION(S) / ED DIAGNOSES  Final diagnoses:  ESRD (end stage renal disease) on dialysis ([place])      NEW MEDICATIONS STARTED DURING THIS VISIT:  New Prescriptions   No medications on file     Note:  This document was prepared using Dragon voice recognition software and may include unintentional dictation errors.       Delman Kitten, MD 03/25/17 1527

## 2017-03-25 NOTE — Progress Notes (Signed)
Dialysis treatment started. 

## 2017-03-25 NOTE — Progress Notes (Signed)
Dialysis treatment completed.
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
## 2017-03-25 NOTE — ED Provider Notes (Addendum)
-----------------------------------------   10:52 PM on 03/25/2017 -----------------------------------------  Patient has returned from dialysis, standing at the desk asking to be discharged home.  Appears well, has no complaints.  Remains hypertensive, he states this is normal and he wants to go home.  Patient has no complaints has received dialysis we will discharge home at this time.   Harvest Dark, MD 03/25/17 2252    Harvest Dark, MD 03/25/17 2253

## 2017-03-25 NOTE — ED Notes (Signed)
Pt taken to dialysis 

## 2017-04-10 ENCOUNTER — Encounter: Payer: Self-pay | Admitting: *Deleted

## 2017-04-10 ENCOUNTER — Emergency Department
Admission: EM | Admit: 2017-04-10 | Discharge: 2017-04-10 | Disposition: A | Discharge status: Home / Self Care | Payer: Medicare Other | Attending: Emergency Medicine | Admitting: Emergency Medicine

## 2017-04-10 ENCOUNTER — Other Ambulatory Visit: Payer: Self-pay

## 2017-04-10 DIAGNOSIS — F17210 Nicotine dependence, cigarettes, uncomplicated: Secondary | ICD-10-CM | POA: Diagnosis not present

## 2017-04-10 DIAGNOSIS — Z79899 Other long term (current) drug therapy: Secondary | ICD-10-CM | POA: Insufficient documentation

## 2017-04-10 DIAGNOSIS — Z7982 Long term (current) use of aspirin: Secondary | ICD-10-CM | POA: Insufficient documentation

## 2017-04-10 DIAGNOSIS — Z992 Dependence on renal dialysis: Secondary | ICD-10-CM | POA: Diagnosis not present

## 2017-04-10 DIAGNOSIS — Z7902 Long term (current) use of antithrombotics/antiplatelets: Secondary | ICD-10-CM | POA: Insufficient documentation

## 2017-04-10 DIAGNOSIS — N186 End stage renal disease: Secondary | ICD-10-CM | POA: Insufficient documentation

## 2017-04-10 DIAGNOSIS — I2510 Atherosclerotic heart disease of native coronary artery without angina pectoris: Secondary | ICD-10-CM | POA: Diagnosis not present

## 2017-04-10 DIAGNOSIS — I120 Hypertensive chronic kidney disease with stage 5 chronic kidney disease or end stage renal disease: Secondary | ICD-10-CM | POA: Insufficient documentation

## 2017-04-10 DIAGNOSIS — J449 Chronic obstructive pulmonary disease, unspecified: Secondary | ICD-10-CM | POA: Diagnosis not present

## 2017-04-10 DIAGNOSIS — R2241 Localized swelling, mass and lump, right lower limb: Secondary | ICD-10-CM | POA: Diagnosis present

## 2017-04-10 LAB — CBC
HEMATOCRIT: 33.7 % — AB (ref 40.0–52.0)
Hemoglobin: 10.9 g/dL — ABNORMAL LOW (ref 13.0–18.0)
MCH: 29 pg (ref 26.0–34.0)
MCHC: 32.3 g/dL (ref 32.0–36.0)
MCV: 89.8 fL (ref 80.0–100.0)
PLATELETS: 193 10*3/uL (ref 150–440)
RBC: 3.76 MIL/uL — AB (ref 4.40–5.90)
RDW: 16 % — AB (ref 11.5–14.5)
WBC: 5.1 10*3/uL (ref 3.8–10.6)

## 2017-04-10 LAB — COMPREHENSIVE METABOLIC PANEL
ALT: 20 U/L (ref 17–63)
AST: 14 U/L — AB (ref 15–41)
Albumin: 3.7 g/dL (ref 3.5–5.0)
Alkaline Phosphatase: 73 U/L (ref 38–126)
Anion gap: 13 (ref 5–15)
BUN: 119 mg/dL — AB (ref 6–20)
CHLORIDE: 115 mmol/L — AB (ref 101–111)
CO2: 14 mmol/L — AB (ref 22–32)
CREATININE: 16.07 mg/dL — AB (ref 0.61–1.24)
Calcium: 8.7 mg/dL — ABNORMAL LOW (ref 8.9–10.3)
GFR calc Af Amer: 4 mL/min — ABNORMAL LOW (ref >60)
GFR calc non Af Amer: 3 mL/min — ABNORMAL LOW (ref >60)
Glucose, Bld: 87 mg/dL (ref 65–99)
Potassium: 5.9 mmol/L — ABNORMAL HIGH (ref 3.5–5.1)
SODIUM: 142 mmol/L (ref 135–145)
Total Bilirubin: 0.6 mg/dL (ref 0.3–1.2)
Total Protein: 7.6 g/dL (ref 6.5–8.1)

## 2017-04-10 NOTE — Progress Notes (Signed)
[facility], [place] 04/10/17  Subjective:   Patient presents to the emergency room a because he has not been dialyzed in 1 week Previously he has been going to [place] for his treatment His blood pressure today is elevated at 220/115   His lab results show that his potassium is high at 5.9, BUN is high at 119 He denies any nausea, vomiting or shortness of breath He is hungry and asking for food  Objective:  Vital signs in last 24 hours:  Temp:  [97.8 F (36.6 C)] 97.8 F (36.6 C) (02/09 1507) Pulse Rate:  [56-58] 56 (02/09 1630) Resp:  [15-19] 15 (02/09 1630) BP: (220-224)/(115-123) 220/115 (02/09 1630) SpO2:  [97 %-100 %] 100 % (02/09 1630) Weight:  [94.3 kg (208 lb)] 94.3 kg (208 lb) (02/09 1507)  Weight change:  Filed Weights   04/10/17 1507  Weight: 94.3 kg (208 lb)    Intake/Output:   No intake or output data in the 24 hours ending 04/10/17 1719   Physical Exam: General:  No acute distress, sitting up in the bed in the ER,    HEENT  anicteric, moist oral mucous membranes  Neck  supple  Pulm/lungs  normal breathing effort, clear to auscultation  CVS/Heart  regular rate and rhythm, no rub or gallop  Abdomen:   Soft, nontender, nondistended  Extremities:  1-2+ edema over lower legs  Neurologic:  Alert and oriented  Skin:  No acute rashes  Access:  Right IJ PermCath       Basic Metabolic Panel:  Recent Labs  Lab 04/10/17 1515  NA 142  K 5.9*  CL 115*  CO2 14*  GLUCOSE 87  BUN 119*  CREATININE 16.07*  CALCIUM 8.7*     CBC: Recent Labs  Lab 04/10/17 1515  WBC 5.1  HGB 10.9*  HCT 33.7*  MCV 89.8  PLT 193      Lab Results  Component Value Date   HEPBSAG Negative 03/09/2017   HEPBSAB Non Reactive 03/09/2017      Microbiology:  No results found for this or any previous visit (from the past 240 hour(s)).  Coagulation Studies: No results for input(s): LABPROT, INR in the last 72  hours.  Urinalysis: No results for input(s): COLORURINE, LABSPEC, PHURINE, GLUCOSEU, HGBUR, BILIRUBINUR, KETONESUR, PROTEINUR, UROBILINOGEN, NITRITE, LEUKOCYTESUR in the last 72 hours.  Invalid input(s): APPERANCEUR    Imaging: No results found.   Medications:       Assessment/ Plan:  47 y.o. 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 Hyperkalemia- patient has no assigned outpatient dialysis clinic currently due to behavior problems.  2.  Malignant HTN 3.  Uremia 4.  Secondary hyperparathyroidism 5.  ESRD on HD 6.  Anemia of chronic kidney disease  Plan: Urgent hemodialysis today to correct potassium and uremia Ultrafiltration goal of 3-4 kg as tolerated Slow blood flow of 250, 3-hour treatment as patient has not been dialyzed in a week    LOS: 0 Nicandro Perrault 2/9/20195:19 PM  [facility] [place] [facility] [place], [place]

## 2017-04-10 NOTE — Progress Notes (Signed)
Pre dialysis  

## 2017-04-10 NOTE — ED Triage Notes (Signed)
Pt to ED reporting hernia pain in groin that has been intermittent for "a while" pt reports he has asked multiple times for it to be looked at when he is in ED and he has not had it assessed. PT reports he is able to push the hernia back in but when he coughs it comes back and is painful. Pt also here for dialysis. Pt reports he missed Friday dialysis due to an issue with his ride. Pt reports he has dialysis through this hospital or [facility] but does not have an outpatient dialysis location.

## 2017-04-10 NOTE — Discharge Instructions (Signed)
Please seek medical attention for any high fevers, chest pain, shortness of breath, change in behavior, persistent vomiting, bloody stool or any other new or concerning symptoms.  

## 2017-04-10 NOTE — ED Provider Notes (Addendum)
[facility] Emergency Department Provider Note   ____________________________________________   I have reviewed the triage vital signs and the nursing notes.   HISTORY  Chief Complaint Hernia and Needs dialysis   History limited by: Not Limited   HPI Fernando Salas is a 47 y.o. male who presents to the emergency department today for dialysis.  He gets his dialysis through the emergency department.  It appears this is his plan for dialysis.  He states he has noticed some swelling in his right leg and some increased shortness of breath.  In addition he is complaining of a hernia to his right groin.  This apparently has been present for years.  He has seen a Psychologist, sport and exercise about this a number of years ago.  He states that he wants somebody to look at it.  He states that he has been able to push it back in and is currently in.   Per medical record review patient has a history of CKD.  Past Medical History:  Diagnosis Date  . CAD (coronary artery disease)    status post multiple myocardial infarctions  . Chronic kidney disease   . COPD (chronic obstructive pulmonary disease) ([place])   . Hyperlipidemia   . Hypertension   . Myocardial infarction ([place])    " I had 13 heart attacks, 8 massive"  . Proteinuria   . Renal insufficiency   . Secondary hyperparathyroidism of renal origin ([place])   . Shortness of breath dyspnea    " when I drink a cup of cold water too fast"  . Sleep apnea    pt stated " long story" does not wear CPAP  . Stroke [facility])     Patient Active Problem List   Diagnosis Date Noted  . Pulmonary edema 03/09/2017  . 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 Fluid overload 02/08/2017  . Accelerated hypertension 01/12/2017  . AKI (acute kidney injury) ([place])   . Unstable angina ([place]) 11/01/2016    Past Surgical History:  Procedure Laterality Date  . AV FISTULA PLACEMENT Left 01-24-14   By Dr. Hortencia Pilar in [place]  . AV FISTULA PLACEMENT Right  04/30/2014   Procedure: RIGHT BRACHIOCEPHALIC ARTERIOVENOUS (AV) FISTULA CREATION;  Surgeon: Elam Dutch, MD;  Location: [facility] OR;  Service: Vascular;  Laterality: Right;  . AV FISTULA PLACEMENT Left 05/31/2014   Procedure: Left INSERTION OF ARTERIOVENOUS (AV) GORE-TEX GRAFT ARM;  Surgeon: Elam Dutch, MD;  Location: [facility];  Service: Vascular;  Laterality: Left;  . CARDIAC CATHETERIZATION    . CORONARY STENT PLACEMENT     13 stents total  . DIALYSIS/PERMA CATHETER INSERTION N/A 01/15/2017   Procedure: DIALYSIS/PERMA CATHETER INSERTION;  Surgeon: Katha Cabal, MD;  Location: [place] CV LAB;  Service: Cardiovascular;  Laterality: N/A;    Prior to Admission medications   Medication Sig Start Date End Date Taking? Authorizing Provider  amLODipine (NORVASC) 10 MG tablet Take 10 mg by mouth daily.    [provider]  aspirin EC 81 MG EC tablet Take 1 tablet (81 mg total) by mouth daily. 11/03/16   Demetrios Loll, MD  atorvastatin (LIPITOR) 40 MG tablet Take 1 tablet (40 mg total) by mouth daily. 11/03/16   Demetrios Loll, MD  carvedilol (COREG) 25 MG tablet Take 25 mg by mouth 2 (two) times daily. 02/19/14   [provider]  clopidogrel (PLAVIX) 75 MG tablet Take 1 tablet (75 mg total) by mouth daily. 11/03/16   Demetrios Loll, MD  ferrous gluconate (FERGON) 324 MG tablet Take 1 tablet (324 mg total) daily with breakfast by mouth. 01/17/17   Epifanio Lesches, MD  fluticasone (FLONASE) 50 MCG/ACT nasal spray INSTILL 1 SPRAY PER NARES DAILY 01/14/16   [provider]  hydrALAZINE (APRESOLINE) 50 MG tablet Take 1 tablet (50 mg total) by mouth every 8 (eight) hours. Patient not taking: Reported on 03/09/2017 02/12/17   Fritzi Mandes, MD  isosorbide-hydrALAZINE (BIDIL) 20-37.5 MG tablet Take 1 tablet by mouth 3 (three) times daily.    [provider]  losartan (COZAAR) 50 MG tablet Take 1 tablet (50 mg total) by mouth daily. 02/12/17   Fritzi Mandes, MD  Multiple Vitamin  (MULTIVITAMIN) tablet Take 1 tablet daily by mouth.    [provider]  nitroGLYCERIN (NITROSTAT) 0.4 MG SL tablet Place 0.4 mg under the tongue every 5 (five) minutes as needed for chest pain.    [provider]    Allergies Ibuprofen; Viagra [sildenafil]; and Clonidine derivatives  Family History  Problem Relation Age of Onset  . Heart disease Mother   . Hyperlipidemia Mother   . Hypertension Mother   . Kidney disease Father   . Kidney disease Brother   . Hyperlipidemia Sister   . Hyperlipidemia Daughter   . Hypertension Sister   . Hypertension Daughter     Social History Social History   Tobacco Use  . Smoking status: Current Every Day Smoker    Packs/day: 0.25    Years: 25.00    Pack years: 6.25    Types: Cigarettes  . Smokeless tobacco: Never Used  Substance Use Topics  . Alcohol use: No    Alcohol/week: 0.0 oz    Frequency: Never    Comment: occasional  . Drug use: Yes    Types: Marijuana    Comment: daily    Review of Systems Constitutional: No fever/chills Eyes: No visual changes. ENT: No sore throat. Cardiovascular: Denies chest pain. Respiratory: Positive for shortness of breath. Gastrointestinal: No abdominal pain.  No nausea, no vomiting.  No diarrhea.   Genitourinary: Negative for dysuria. Musculoskeletal: Positive for leg swelling. Skin: Negative for rash. Neurological: Negative for headaches, focal weakness or numbness.  ____________________________________________   PHYSICAL EXAM:  VITAL SIGNS: ED Triage Vitals [04/10/17 1507]  Enc Vitals Group     BP (!) 224/123     Pulse Rate (!) 58     Resp 16     Temp 97.8 F (36.6 C)     Temp Source Oral     SpO2 97 %     Weight 208 lb (94.3 kg)     Height 6' (1.829 m)     Head Circumference      Peak Flow      Pain Score 7   Constitutional: Alert and oriented. Well appearing and in no distress. Eyes: Conjunctivae are normal.  ENT   Head: Normocephalic and  atraumatic.   Nose: No congestion/rhinnorhea.   Mouth/Throat: Mucous membranes are moist.   Neck: No stridor. Hematological/Lymphatic/Immunilogical: No cervical lymphadenopathy. Cardiovascular: Normal rate, regular rhythm.  No murmurs, rubs, or gallops.  Respiratory: Normal respiratory effort without tachypnea nor retractions. Breath sounds are clear and equal bilaterally. No wheezes/rales/rhonchi. Gastrointestinal: Soft and non tender. No rebound. No guarding. No inguinal hernia appreciated. Genitourinary: Deferred Musculoskeletal: Normal range of motion in all extremities. No lower extremity edema. Neurologic:  Normal speech and language. No gross focal neurologic deficits are appreciated.  Skin:  Skin is warm, dry and intact. No  rash noted. Psychiatric: Mood and affect are normal. Speech and behavior are normal. Patient exhibits appropriate insight and judgment.  ____________________________________________    LABS (pertinent positives/negatives)  CBC wnc 5.1, hgb 10.9, plt 193 CMP k 5.9, bun 119, cr 16.07  ____________________________________________   EKG  None  ____________________________________________    RADIOLOGY  None  ____________________________________________   PROCEDURES  Procedures  ____________________________________________   INITIAL IMPRESSION / ASSESSMENT AND PLAN / ED COURSE  Pertinent labs & imaging results that were available during my care of the patient were reviewed by me and considered in my medical decision making (see chart for details).  Presented to the emergency department today for dialysis.  Patient will be sent to dialysis.  Additionally he had complaints of right inguinal hernia.  On exam no hernia appreciated and he states that he is able to push it in.  Discussed with patient that he should follow-up with surgery.  Will give patient general surgery follow-up  information.  ----------------------------------------- 9:40 PM on 04/10/2017 -----------------------------------------  Patient returned from dialysis.  Patient's blood pressure is high.  Patient states that he is on blood pressure medication.  I did discuss with patient that it is important he continue to take his blood pressure.  Per chart review he chronically has elevated blood pressure.  Currently not symptomatic for spontaneous head bleed or acute coronary syndrome.  ____________________________________________   FINAL CLINICAL IMPRESSION(S) / ED DIAGNOSES  Final diagnoses:  Stage 5 chronic kidney disease on chronic dialysis [facility])     Note: This dictation was prepared with Dragon dictation. Any transcriptional errors that result from this process are unintentional     Nance Pear, MD 04/10/17 1727    Nance Pear, MD 04/10/17 2141

## 2017-04-10 NOTE — ED Notes (Signed)
Pt refuses to sign discharge instructions. Pt states "they need to stop all this, why i'm signin' this. I come, I get dialysis, I don't sign". Pt then proceeds to state "I don't want that paperwork, I don't need that, i'm not signin'

## 2017-04-10 NOTE — ED Notes (Signed)
Nephrologist at bedside

## 2017-04-10 NOTE — ED Notes (Signed)
md aware of blood pressure 228/112. md states he is going to discharge pt with this knowledge, discharge papers secured.

## 2017-04-10 NOTE — Progress Notes (Signed)
Post dialysis assessment 

## 2017-04-10 NOTE — ED Notes (Addendum)
Picked up and transport patient from Dialysis to ED rm 14.AS

## 2017-04-10 NOTE — Progress Notes (Signed)
Dialysis treatment started. 

## 2017-04-10 NOTE — ED Notes (Signed)
Report from dialysis, dialysis rn reports pt with continued hypertension. Pt to come back to room 14 and be placed on monitor.

## 2017-04-10 NOTE — Progress Notes (Signed)
Post dialysis treatment assessment.

## 2017-04-15 ENCOUNTER — Emergency Department
Admission: EM | Admit: 2017-04-15 | Discharge: 2017-04-15 | Discharge status: Against Medical Advice | Payer: Medicare Other | Attending: Student in an Organized Health Care Education/Training Program | Admitting: Student in an Organized Health Care Education/Training Program

## 2017-04-15 ENCOUNTER — Other Ambulatory Visit: Payer: Self-pay

## 2017-04-15 ENCOUNTER — Encounter: Payer: Self-pay | Admitting: Emergency Medicine

## 2017-04-15 DIAGNOSIS — I2510 Atherosclerotic heart disease of native coronary artery without angina pectoris: Secondary | ICD-10-CM | POA: Insufficient documentation

## 2017-04-15 DIAGNOSIS — Z992 Dependence on renal dialysis: Secondary | ICD-10-CM | POA: Insufficient documentation

## 2017-04-15 DIAGNOSIS — J449 Chronic obstructive pulmonary disease, unspecified: Secondary | ICD-10-CM | POA: Diagnosis not present

## 2017-04-15 DIAGNOSIS — I120 Hypertensive chronic kidney disease with stage 5 chronic kidney disease or end stage renal disease: Secondary | ICD-10-CM | POA: Insufficient documentation

## 2017-04-15 DIAGNOSIS — N186 End stage renal disease: Secondary | ICD-10-CM | POA: Insufficient documentation

## 2017-04-15 DIAGNOSIS — Z5320 Procedure and treatment not carried out because of patient's decision for unspecified reasons: Secondary | ICD-10-CM | POA: Insufficient documentation

## 2017-04-15 DIAGNOSIS — F17210 Nicotine dependence, cigarettes, uncomplicated: Secondary | ICD-10-CM | POA: Insufficient documentation

## 2017-04-15 DIAGNOSIS — Z7982 Long term (current) use of aspirin: Secondary | ICD-10-CM | POA: Insufficient documentation

## 2017-04-15 DIAGNOSIS — Z7902 Long term (current) use of antithrombotics/antiplatelets: Secondary | ICD-10-CM | POA: Insufficient documentation

## 2017-04-15 DIAGNOSIS — Z79899 Other long term (current) drug therapy: Secondary | ICD-10-CM | POA: Insufficient documentation

## 2017-04-15 LAB — BASIC METABOLIC PANEL
Anion gap: 12 (ref 5–15)
BUN: 87 mg/dL — AB (ref 6–20)
CO2: 19 mmol/L — ABNORMAL LOW (ref 22–32)
CREATININE: 13.56 mg/dL — AB (ref 0.61–1.24)
Calcium: 8.4 mg/dL — ABNORMAL LOW (ref 8.9–10.3)
Chloride: 109 mmol/L (ref 101–111)
GFR calc Af Amer: 4 mL/min — ABNORMAL LOW (ref >60)
GFR, EST NON AFRICAN AMERICAN: 4 mL/min — AB (ref >60)
Glucose, Bld: 116 mg/dL — ABNORMAL HIGH (ref 65–99)
Potassium: 5.3 mmol/L — ABNORMAL HIGH (ref 3.5–5.1)
SODIUM: 140 mmol/L (ref 135–145)

## 2017-04-15 NOTE — ED Notes (Signed)
Report received from dialysis and pt on way back.  Pt seen returning to hall 1 and when nurse came back 5 minutes later pt gone and cannot be found. Charge RN and MD aware.

## 2017-04-15 NOTE — ED Notes (Signed)
Pt here for dialysis.  Last received Monday or Friday. No complaints at this time other than requesting food. Will order tray and wait for pt to receive dialysis.

## 2017-04-15 NOTE — ED Notes (Addendum)
Transported to dialysis with tech and security. Pt was wanded in ED

## 2017-04-15 NOTE — ED Notes (Signed)
Spoke with dialysis RN and she reports pt will be towards end of day. Will update pt when wakes.

## 2017-04-15 NOTE — ED Triage Notes (Addendum)
Patient ambulatory to triage with steady gait, without difficulty or distress noted; pt reports here for dialysis, has missed several visits due to transportation issues

## 2017-04-15 NOTE — ED Notes (Signed)
Remains to wait on dialysis. Ate lunch. Ambulated to restroom.

## 2017-04-15 NOTE — Progress Notes (Signed)
Pre HD  

## 2017-04-15 NOTE — Progress Notes (Signed)
HD started. 

## 2017-04-15 NOTE — ED Provider Notes (Signed)
[facility] Emergency Department Provider Note    None    (approximate)  I have reviewed the triage vital signs and the nursing notes.   HISTORY  Chief Complaint dialysis    HPI Fernando Salas is a 47 y.o. male with the below listed chronic medical conditions presents to the ER for dialysis.  Last dialysis was on Monday but patient takes it may be on Friday.  Patient ran into issues with transportation and has been having to receive dialysis in the hospital intermittently.  Denies any chest pain.  No shortness of breath.  No numbness or tingling.  No fevers.  Did not take his morning blood pressure medications.  Past Medical History:  Diagnosis Date  . CAD (coronary artery disease)    status post multiple myocardial infarctions  . Chronic kidney disease   . COPD (chronic obstructive pulmonary disease) ([place])   . Hyperlipidemia   . Hypertension   . Myocardial infarction ([place])    " I had 13 heart attacks, 8 massive"  . Proteinuria   . Renal insufficiency   . Secondary hyperparathyroidism of renal origin ([place])   . Shortness of breath dyspnea    " when I drink a cup of cold water too fast"  . Sleep apnea    pt stated " long story" does not wear CPAP  . Stroke [facility])    Family History  Problem Relation Age of Onset  . Heart disease Mother   . Hyperlipidemia Mother   . Hypertension Mother   . Kidney disease Father   . Kidney disease Brother   . Hyperlipidemia Sister   . Hyperlipidemia Daughter   . Hypertension Sister   . Hypertension Daughter    Past Surgical History:  Procedure Laterality Date  . AV FISTULA PLACEMENT Left 01-24-14   By Dr. Hortencia Pilar in [place]  . AV FISTULA PLACEMENT Right 04/30/2014   Procedure: RIGHT BRACHIOCEPHALIC ARTERIOVENOUS (AV) FISTULA CREATION;  Surgeon: Elam Dutch, MD;  Location[facility];  Service: Vascular;  Laterality: Right;  . AV FISTULA PLACEMENT Left 05/31/2014   Procedure: Left INSERTION OF  ARTERIOVENOUS (AV) GORE-TEX GRAFT ARM;  Surgeon: Elam Dutch, MD;  Location: [place];  Service: Vascular;  Laterality: Left;  . CARDIAC CATHETERIZATION    . CORONARY STENT PLACEMENT     13 stents total  . DIALYSIS/PERMA CATHETER INSERTION N/A 01/15/2017   Procedure: DIALYSIS/PERMA CATHETER INSERTION;  Surgeon: Katha Cabal, MD;  Location: [place] CV LAB;  Service: Cardiovascular;  Laterality: N/A;   Patient Active Problem List   Diagnosis Date Noted  . Pulmonary edema 03/09/2017  . 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 Fluid overload 02/08/2017  . Accelerated hypertension 01/12/2017  . AKI (acute kidney injury) ([place])   . Unstable angina ([place]) 11/01/2016      Prior to Admission medications   Medication Sig Start Date End Date Taking? Authorizing Provider  amLODipine (NORVASC) 10 MG tablet Take 10 mg by mouth daily.    [provider]  aspirin EC 81 MG EC tablet Take 1 tablet (81 mg total) by mouth daily. 11/03/16   Demetrios Loll, MD  atorvastatin (LIPITOR) 40 MG tablet Take 1 tablet (40 mg total) by mouth daily. 11/03/16   Demetrios Loll, MD  carvedilol (COREG) 25 MG tablet Take 25 mg by mouth 2 (two) times daily. 02/19/14   [provider]  clopidogrel (PLAVIX) 75 MG tablet Take 1 tablet (75 mg total) by mouth daily.  11/03/16   Demetrios Loll, MD  ferrous gluconate (FERGON) 324 MG tablet Take 1 tablet (324 mg total) daily with breakfast by mouth. 01/17/17   Epifanio Lesches, MD  fluticasone (FLONASE) 50 MCG/ACT nasal spray INSTILL 1 SPRAY PER NARES DAILY 01/14/16   [provider]  hydrALAZINE (APRESOLINE) 50 MG tablet Take 1 tablet (50 mg total) by mouth every 8 (eight) hours. Patient not taking: Reported on 03/09/2017 02/12/17   Fritzi Mandes, MD  isosorbide-hydrALAZINE (BIDIL) 20-37.5 MG tablet Take 1 tablet by mouth 3 (three) times daily.    [provider]  losartan (COZAAR) 50 MG tablet Take 1 tablet (50 mg total) by mouth daily. 02/12/17    Fritzi Mandes, MD  Multiple Vitamin (MULTIVITAMIN) tablet Take 1 tablet daily by mouth.    [provider]  nitroGLYCERIN (NITROSTAT) 0.4 MG SL tablet Place 0.4 mg under the tongue every 5 (five) minutes as needed for chest pain.    [provider]    Allergies Ibuprofen; Viagra [sildenafil]; and Clonidine derivatives    Social History Social History   Tobacco Use  . Smoking status: Current Every Day Smoker    Packs/day: 0.25    Years: 25.00    Pack years: 6.25    Types: Cigarettes  . Smokeless tobacco: Never Used  Substance Use Topics  . Alcohol use: No    Alcohol/week: 0.0 oz    Frequency: Never    Comment: occasional  . Drug use: Yes    Types: Marijuana    Comment: daily    Review of Systems Patient denies headaches, rhinorrhea, blurry vision, numbness, shortness of breath, chest pain, edema, cough, abdominal pain, nausea, vomiting, diarrhea, dysuria, fevers, rashes or hallucinations unless otherwise stated above in HPI. ____________________________________________   PHYSICAL EXAM:  VITAL SIGNS: Vitals:   04/15/17 1425 04/15/17 1433  BP:    Pulse: 64 64  Resp: 18 20  Temp:    SpO2:  99%    Constitutional: Alert and oriented. Well appearing and in no acute distress. Eyes: Conjunctivae are normal.  Head: Atraumatic. Nose: No congestion/rhinnorhea. Mouth/Throat: Mucous membranes are moist.   Neck: Painless ROM.  Cardiovascular:   Good peripheral circulation. Respiratory: Normal respiratory effort.  No retractions.  Gastrointestinal: Soft and nontender.  Musculoskeletal: No lower extremity tenderness .  No joint effusions. Neurologic:  Normal speech and language. No gross focal neurologic deficits are appreciated.  Skin:  Skin is warm, dry and intact. No rash noted. Psychiatric: Mood and affect are normal. Speech and behavior are normal.  ____________________________________________   LABS (all labs ordered are listed, but only abnormal  results are displayed)  Results for orders placed or performed during the hospital encounter of 04/15/17 (from the past 24 hour(s))  Basic metabolic panel     Status: Abnormal   Collection Time: 04/15/17  7:15 AM  Result Value Ref Range   Sodium 140 135 - 145 mmol/L   Potassium 5.3 (H) 3.5 - 5.1 mmol/L   Chloride 109 101 - 111 mmol/L   CO2 19 (L) 22 - 32 mmol/L   Glucose, Bld 116 (H) 65 - 99 mg/dL   BUN 87 (H) 6 - 20 mg/dL   Creatinine, Ser 13.56 (H) 0.61 - 1.24 mg/dL   Calcium 8.4 (L) 8.9 - 10.3 mg/dL   GFR calc non Af Amer 4 (L) >60 mL/min   GFR calc Af Amer 4 (L) >60 mL/min   Anion gap 12 5 - 15   ____________________________________________  ____________________________________________  RADIOLOGY  ____________________________________________   PROCEDURES  Procedure(s) performed:  Procedures    Critical Care performed: no ____________________________________________   INITIAL IMPRESSION / ASSESSMENT AND PLAN / ED COURSE  Pertinent labs & imaging results that were available during my care of the patient were reviewed by me and considered in my medical decision making (see chart for details).  DDX: hyperkalemia, chf, noncompliance  LERON STOFFERS is a 47 y.o. who presents to the ED with end-stage renal disease on dialysis presented to the ER for dialysis.  He denies any.  Blood pressure is markedly elevated but he has no respiratory distress.  I spoke with Dr. Zollie Scale of nephrology who is aware patient will arrange for dialysis.  Will hold antihypertensive medications unless will be significant delay to dialysis today.      ____________________________________________   FINAL CLINICAL IMPRESSION(S) / ED DIAGNOSES  Final diagnoses:  ESRD on dialysis ([place])      NEW MEDICATIONS STARTED DURING THIS VISIT:  Current Discharge Medication List       Note:  This document was prepared using Dragon voice recognition software and may include unintentional  dictation errors.     Merlyn Lot, MD 04/15/17 1459

## 2017-04-15 NOTE — ED Notes (Signed)
Pt asking about lunch tray. Informed him one will automatically come up.  He also wants to know when going to dialysis, explained that ED has no control over this but they are aware he is here and will call when ready for him.

## 2017-04-15 NOTE — ED Notes (Signed)
Unable to have pt sign AMA form as cannot find

## 2017-04-22 ENCOUNTER — Other Ambulatory Visit: Payer: Self-pay

## 2017-04-22 ENCOUNTER — Encounter: Payer: Self-pay | Admitting: Emergency Medicine

## 2017-04-22 ENCOUNTER — Emergency Department
Admission: EM | Admit: 2017-04-22 | Discharge: 2017-04-22 | Disposition: A | Discharge status: Home / Self Care | Payer: Medicare Other | Attending: Emergency Medicine | Admitting: Emergency Medicine

## 2017-04-22 DIAGNOSIS — Z992 Dependence on renal dialysis: Secondary | ICD-10-CM | POA: Diagnosis not present

## 2017-04-22 DIAGNOSIS — J449 Chronic obstructive pulmonary disease, unspecified: Secondary | ICD-10-CM | POA: Diagnosis not present

## 2017-04-22 DIAGNOSIS — I252 Old myocardial infarction: Secondary | ICD-10-CM | POA: Insufficient documentation

## 2017-04-22 DIAGNOSIS — Z7902 Long term (current) use of antithrombotics/antiplatelets: Secondary | ICD-10-CM | POA: Insufficient documentation

## 2017-04-22 DIAGNOSIS — N189 Chronic kidney disease, unspecified: Secondary | ICD-10-CM | POA: Diagnosis not present

## 2017-04-22 DIAGNOSIS — Z7982 Long term (current) use of aspirin: Secondary | ICD-10-CM | POA: Diagnosis not present

## 2017-04-22 DIAGNOSIS — I2510 Atherosclerotic heart disease of native coronary artery without angina pectoris: Secondary | ICD-10-CM | POA: Diagnosis not present

## 2017-04-22 DIAGNOSIS — Z79899 Other long term (current) drug therapy: Secondary | ICD-10-CM | POA: Insufficient documentation

## 2017-04-22 DIAGNOSIS — Z8673 Personal history of transient ischemic attack (TIA), and cerebral infarction without residual deficits: Secondary | ICD-10-CM | POA: Insufficient documentation

## 2017-04-22 DIAGNOSIS — F17210 Nicotine dependence, cigarettes, uncomplicated: Secondary | ICD-10-CM | POA: Diagnosis not present

## 2017-04-22 DIAGNOSIS — I129 Hypertensive chronic kidney disease with stage 1 through stage 4 chronic kidney disease, or unspecified chronic kidney disease: Secondary | ICD-10-CM | POA: Diagnosis not present

## 2017-04-22 LAB — BASIC METABOLIC PANEL
ANION GAP: 14 (ref 5–15)
BUN: 98 mg/dL — ABNORMAL HIGH (ref 6–20)
CALCIUM: 8 mg/dL — AB (ref 8.9–10.3)
CO2: 16 mmol/L — ABNORMAL LOW (ref 22–32)
CREATININE: 15.49 mg/dL — AB (ref 0.61–1.24)
Chloride: 110 mmol/L (ref 101–111)
GFR calc Af Amer: 4 mL/min — ABNORMAL LOW (ref >60)
GFR, EST NON AFRICAN AMERICAN: 3 mL/min — AB (ref >60)
GLUCOSE: 86 mg/dL (ref 65–99)
Potassium: 5.1 mmol/L (ref 3.5–5.1)
Sodium: 140 mmol/L (ref 135–145)

## 2017-04-22 LAB — CBC
HCT: 30.5 % — ABNORMAL LOW (ref 40.0–52.0)
HEMOGLOBIN: 10.1 g/dL — AB (ref 13.0–18.0)
MCH: 28.9 pg (ref 26.0–34.0)
MCHC: 33.1 g/dL (ref 32.0–36.0)
MCV: 87.1 fL (ref 80.0–100.0)
PLATELETS: 146 10*3/uL — AB (ref 150–440)
RBC: 3.5 MIL/uL — ABNORMAL LOW (ref 4.40–5.90)
RDW: 16.3 % — ABNORMAL HIGH (ref 11.5–14.5)
WBC: 5.9 10*3/uL (ref 3.8–10.6)

## 2017-04-22 LAB — TROPONIN I: TROPONIN I: 0.04 ng/mL — AB (ref <0.03)

## 2017-04-22 NOTE — Progress Notes (Signed)
HD started. 

## 2017-04-22 NOTE — ED Notes (Signed)
Date and time results received: 04/22/17 1130 (use smartphrase ".now" to insert current time)  Test: Troponin Critical Value: 0.04  Name of Provider Notified: Corky Downs

## 2017-04-22 NOTE — ED Provider Notes (Signed)
[facility] Emergency Department Provider Note   ____________________________________________    I have reviewed the triage vital signs and the nursing notes.   HISTORY  Chief Complaint Chronic Renal Failure     HPI Fernando Salas is a 47 y.o. male with a history of end-stage renal disease who presents to the emergency department for dialysis.  Last dialysis was 6 days ago.  Patient reports he has had difficulty with getting to dialysis.  Patient frequently comes to the emergency department for dialysis unfortunately.  He reports his breathing is adequate, no chest pain or palpitations.   Past Medical History:  Diagnosis Date  . CAD (coronary artery disease)    status post multiple myocardial infarctions  . Chronic kidney disease   . COPD (chronic obstructive pulmonary disease) ([place])   . Hyperlipidemia   . Hypertension   . Myocardial infarction ([place])    " I had 13 heart attacks, 8 massive"  . Proteinuria   . Renal insufficiency   . Secondary hyperparathyroidism of renal origin ([place])   . Shortness of breath dyspnea    " when I drink a cup of cold water too fast"  . Sleep apnea    pt stated " long story" does not wear CPAP  . Stroke [facility])     Patient Active Problem List   Diagnosis Date Noted  . Pulmonary edema 03/09/2017  . 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 Fluid overload 02/08/2017  . Accelerated hypertension 01/12/2017  . AKI (acute kidney injury) ([place])   . Unstable angina ([place]) 11/01/2016    Past Surgical History:  Procedure Laterality Date  . AV FISTULA PLACEMENT Left 01-24-14   By Dr. Hortencia Pilar in [place]  . AV FISTULA PLACEMENT Right 04/30/2014   Procedure: RIGHT BRACHIOCEPHALIC ARTERIOVENOUS (AV) FISTULA CREATION;  Surgeon: Elam Dutch, MD;  Location: [facility] OR;  Service: Vascular;  Laterality: Right;  . AV FISTULA PLACEMENT Left 05/31/2014   Procedure: Left INSERTION OF ARTERIOVENOUS (AV) GORE-TEX GRAFT ARM;   Surgeon: Elam Dutch, MD;  Location: [place];  Service: Vascular;  Laterality: Left;  . CARDIAC CATHETERIZATION    . CORONARY STENT PLACEMENT     13 stents total  . DIALYSIS/PERMA CATHETER INSERTION N/A 01/15/2017   Procedure: DIALYSIS/PERMA CATHETER INSERTION;  Surgeon: Katha Cabal, MD;  Location: [place] CV LAB;  Service: Cardiovascular;  Laterality: N/A;    Prior to Admission medications   Medication Sig Start Date End Date Taking? Authorizing Provider  amLODipine (NORVASC) 10 MG tablet Take 10 mg by mouth daily.    [provider]  aspirin EC 81 MG EC tablet Take 1 tablet (81 mg total) by mouth daily. 11/03/16   Demetrios Loll, MD  atorvastatin (LIPITOR) 40 MG tablet Take 1 tablet (40 mg total) by mouth daily. 11/03/16   Demetrios Loll, MD  carvedilol (COREG) 25 MG tablet Take 25 mg by mouth 2 (two) times daily. 02/19/14   [provider]  clopidogrel (PLAVIX) 75 MG tablet Take 1 tablet (75 mg total) by mouth daily. 11/03/16   Demetrios Loll, MD  ferrous gluconate (FERGON) 324 MG tablet Take 1 tablet (324 mg total) daily with breakfast by mouth. 01/17/17   Epifanio Lesches, MD  fluticasone (FLONASE) 50 MCG/ACT nasal spray INSTILL 1 SPRAY PER NARES DAILY 01/14/16   [provider]  hydrALAZINE (APRESOLINE) 50 MG tablet Take 1 tablet (50 mg total) by mouth every 8 (eight) hours. Patient not taking: Reported on  03/09/2017 02/12/17   Fritzi Mandes, MD  isosorbide-hydrALAZINE (BIDIL) 20-37.5 MG tablet Take 1 tablet by mouth 3 (three) times daily.    [provider]  losartan (COZAAR) 50 MG tablet Take 1 tablet (50 mg total) by mouth daily. 02/12/17   Fritzi Mandes, MD  Multiple Vitamin (MULTIVITAMIN) tablet Take 1 tablet daily by mouth.    [provider]  nitroGLYCERIN (NITROSTAT) 0.4 MG SL tablet Place 0.4 mg under the tongue every 5 (five) minutes as needed for chest pain.    [provider]     Allergies Ibuprofen; Viagra [sildenafil];  and Clonidine derivatives  Family History  Problem Relation Age of Onset  . Heart disease Mother   . Hyperlipidemia Mother   . Hypertension Mother   . Kidney disease Father   . Kidney disease Brother   . Hyperlipidemia Sister   . Hyperlipidemia Daughter   . Hypertension Sister   . Hypertension Daughter     Social History Social History   Tobacco Use  . Smoking status: Current Every Day Smoker    Packs/day: 0.25    Years: 25.00    Pack years: 6.25    Types: Cigarettes  . Smokeless tobacco: Never Used  Substance Use Topics  . Alcohol use: No    Alcohol/week: 0.0 oz    Frequency: Never    Comment: occasional  . Drug use: Yes    Types: Marijuana    Comment: daily    Review of Systems  Constitutional: No fever/chills Eyes: No visual changes.  ENT: No sore throat. Cardiovascular: Denies chest pain. Respiratory: "Adequate " Gastrointestinal: No abdominal pain.  No nausea, no vomiting.   Genitourinary: Negative for dysuria. Musculoskeletal: Some lower leg edema, mild Skin: Negative for rash. Neurological: Negative for headaches    ____________________________________________   PHYSICAL EXAM:  VITAL SIGNS: ED Triage Vitals [04/22/17 1046]  Enc Vitals Group     BP (!) 215/124     Pulse Rate 70     Resp 18     Temp 98.7 F (37.1 C)     Temp Source Oral     SpO2 96 %     Weight 94.3 kg (208 lb)     Height 1.829 m (6')     Head Circumference      Peak Flow      Pain Score      Pain Loc      Pain Edu?      Excl. in [place]?     Constitutional: Alert and oriented. No acute distress.    Mouth/Throat: Mucous membranes are moist.    Cardiovascular: Normal rate, regular rhythm. Grossly normal heart sounds.  Good peripheral circulation. Respiratory: Normal respiratory effort.  No retractions.  Bibasilar Rales, mild Gastrointestinal: Soft and nontender. No distention.   Musculoskeletal:  Warm and well perfused Neurologic:  Normal speech and language. No gross  focal neurologic deficits are appreciated.  Skin:  Skin is warm, dry and intact. No rash noted. Psychiatric: Mood and affect are normal. Speech and behavior are normal.  ____________________________________________   LABS (all labs ordered are listed, but only abnormal results are displayed)  Labs Reviewed  BASIC METABOLIC PANEL - Abnormal; Notable for the following components:      Result Value   CO2 16 (*)    BUN 98 (*)    Creatinine, Ser 15.49 (*)    Calcium 8.0 (*)    GFR calc non Af Amer 3 (*)    GFR calc Af  Amer 4 (*)    All other components within normal limits  CBC - Abnormal; Notable for the following components:   RBC 3.50 (*)    Hemoglobin 10.1 (*)    HCT 30.5 (*)    RDW 16.3 (*)    Platelets 146 (*)    All other components within normal limits  TROPONIN I - Abnormal; Notable for the following components:   Troponin I 0.04 (*)    All other components within normal limits   ____________________________________________  EKG   ____________________________________________  RADIOLOGY  None ____________________________________________   PROCEDURES  Procedure(s) performed: No  Procedures   Critical Care performed: No ____________________________________________   INITIAL IMPRESSION / ASSESSMENT AND PLAN / ED COURSE  Pertinent labs & imaging results that were available during my care of the patient were reviewed by me and considered in my medical decision making (see chart for details).  Patient presents primarily for dialysis.  Hypertensive but this is not unusual for him, lab work is overall as expected.  Potassium normal.  Mildly elevated troponin likely caused by renal disease.  No chest pain or shortness of breath.  Discussed with Dr. Candiss Norse of nephrology, dialysis will be arranged    ____________________________________________   FINAL CLINICAL IMPRESSION(S) / ED DIAGNOSES  Final diagnoses:  Encounter for dialysis [facility])        Note:   This document was prepared using Dragon voice recognition software and may include unintentional dictation errors.    Lavonia Drafts, MD 04/22/17 [phone]

## 2017-04-22 NOTE — ED Notes (Signed)
Patrice in dialysis called this RN and stated that Pt needed to be wand by security and security is to bring pt. Pt is to have a consent signed.

## 2017-04-22 NOTE — Progress Notes (Signed)
[facility], [place] 04/22/17  Subjective:   Patient presents to the emergency room for evaluation for HD Previously he has been going to [place] for his treatment His blood pressure today is elevated at 210/128   His lab results show that his potassium is high at 5.1, BUN is high at 98 He denies any nausea, vomiting or shortness of breath  trace edema  Objective:  Vital signs in last 24 hours:  Temp:  [98.7 F (37.1 C)] 98.7 F (37.1 C) (02/21 1046) Pulse Rate:  [62-70] 64 (02/21 1445) Resp:  [14-24] 24 (02/21 1445) BP: (184-222)/(124-199) 210/128 (02/21 1445) SpO2:  [95 %-97 %] 97 % (02/21 1445) Weight:  [94.3 kg (208 lb)] 94.3 kg (208 lb) (02/21 1046)  Weight change:  Filed Weights   04/22/17 1046  Weight: 94.3 kg (208 lb)    Intake/Output:   No intake or output data in the 24 hours ending 04/22/17 1503   Physical Exam: General:  No acute distress, sitting up in the chair in the ER,    HEENT  anicteric, moist oral mucous membranes  Neck  supple  Pulm/lungs  normal breathing effort, clear to auscultation  CVS/Heart  regular rate and rhythm, no rub or gallop  Abdomen:   Soft, nontender, nondistended  Extremities:  trace edema over lower legs  Neurologic:  Alert and oriented  Skin:  No acute rashes  Access:  Right IJ PermCath       Basic Metabolic Panel:  Recent Labs  Lab 04/22/17 1053  NA 140  K 5.1  CL 110  CO2 16*  GLUCOSE 86  BUN 98*  CREATININE 15.49*  CALCIUM 8.0*     CBC: Recent Labs  Lab 04/22/17 1053  WBC 5.9  HGB 10.1*  HCT 30.5*  MCV 87.1  PLT 146*      Lab Results  Component Value Date   HEPBSAG Negative 03/09/2017   HEPBSAB Non Reactive 03/09/2017      Microbiology:  No results found for this or any previous visit (from the past 240 hour(s)).  Coagulation Studies: No results for input(s): LABPROT, INR in the last 72 hours.  Urinalysis: No results for input(s): COLORURINE,  LABSPEC, PHURINE, GLUCOSEU, HGBUR, BILIRUBINUR, KETONESUR, PROTEINUR, UROBILINOGEN, NITRITE, LEUKOCYTESUR in the last 72 hours.  Invalid input(s): APPERANCEUR    Imaging: No results found.   Medications:       Assessment/ Plan:  47 y.o. 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 Malignant HTN  2.  Uremia 3.  ESRD on HD 4.  Secondary hyperparathyroidism 5.  Anemia of chronic kidney disease  Plan: Ultrafiltration goal of 3-4 kg as tolerated Slow blood flow of 250 -300 despite heparin Resume home BP meds; Not sure about compliance Avoid EPO at present due to Severe HTN    LOS: 0 Toussaint Golson [facility] 2/21/20193:03 PM  [facility] [place], [place]

## 2017-04-22 NOTE — Progress Notes (Signed)
Pre HD  

## 2017-04-22 NOTE — ED Notes (Signed)
Upon discharge education pt stated "I dont need this paperwork, I will not be back here nor will I sign anything!" Pt refuses education d/c paperwork as well as signing for D/C. Pt is ambulatory A&O x4 NAD> VSS>

## 2017-04-22 NOTE — ED Notes (Signed)
Per Dr Reita Cliche, no ekg at this time

## 2017-04-22 NOTE — Progress Notes (Signed)
Chaplain encounter Pt in ED as he waited for dialysis. Pt was upset because he had to be escorted by security. Chaplain attempted to deescalate with support and prayer. Pt did quieted down after prayer.   04/22/17 1300  Clinical Encounter Type  Visited With Patient  Visit Type Initial  Referral From Patient  Spiritual Encounters  Spiritual Needs Prayer;Emotional

## 2017-04-22 NOTE — Progress Notes (Signed)
HD complete 

## 2017-04-22 NOTE — ED Notes (Signed)
Pt transferred to dialysis at this time, via Alfredo Martinez and [employer]. Dialysis Carolee Rota is aware.

## 2017-04-22 NOTE — ED Provider Notes (Addendum)
Signout from Dr. Corky Downs in this 47 year old male with end-stage renal disease on dialysis.  Plan is for the patient to be discharged after returned after finishing dialysis.  Physical Exam  BP (!) 218/133 (BP Location: Left Arm)   Pulse 65   Temp 98.7 F (37.1 C) (Oral)   Resp (!) 21   Ht 6' (1.829 m)   Wt 94.3 kg (208 lb)   SpO2 100%   BMI 28.21 kg/m  ----------------------------------------- 3:55 PM on 04/22/2017 -----------------------------------------   Physical Exam Patient resting comfortably without complaints.  Right-sided chest permacath which is clean dry and intact.  No shortness of breath/respiratory complaints. ED Course/Procedures     Procedures  MDM  Patient has returned from dialysis.  He has no complaints at this time.  He will be discharged and knows that he must return in 2 days for his next dialysis session.  The patient is understanding of this plan but says that he usually goes a week or so and does not come back until he breath.  At that point he returns for dialysis.  We discussed that this is extremely dangerous and that using the sort of routine to judge when he needs his dialysis could result in his death or permanent disability.  The patient is awake and alert and has good insight into the effects of his decisions.  He is not clinically intoxicated.  He says that he has had multiple family members that have died from kidney failure while on dialysis and understands the gravity of the situation.  He will be discharged at this time.       Orbie Pyo, MD 04/22/17 [place], Randall An, MD 04/22/17 [phone]

## 2017-04-22 NOTE — ED Triage Notes (Signed)
Pt here for hemodialysis. Last dialysis Friday.

## 2017-04-22 NOTE — ED Notes (Signed)
Procedure consent signed. Pt is awaiting Security for transport. Security has been notified.

## 2017-05-01 ENCOUNTER — Emergency Department
Admission: EM | Admit: 2017-05-01 | Discharge: 2017-05-01 | Disposition: A | Discharge status: Home / Self Care | Payer: Medicare Other | Attending: Emergency Medicine | Admitting: Emergency Medicine

## 2017-05-01 ENCOUNTER — Encounter: Payer: Self-pay | Admitting: Emergency Medicine

## 2017-05-01 ENCOUNTER — Other Ambulatory Visit: Payer: Self-pay

## 2017-05-01 DIAGNOSIS — F17210 Nicotine dependence, cigarettes, uncomplicated: Secondary | ICD-10-CM | POA: Insufficient documentation

## 2017-05-01 DIAGNOSIS — Z992 Dependence on renal dialysis: Secondary | ICD-10-CM | POA: Diagnosis not present

## 2017-05-01 DIAGNOSIS — I252 Old myocardial infarction: Secondary | ICD-10-CM | POA: Diagnosis not present

## 2017-05-01 DIAGNOSIS — R0602 Shortness of breath: Secondary | ICD-10-CM | POA: Diagnosis present

## 2017-05-01 DIAGNOSIS — Z79899 Other long term (current) drug therapy: Secondary | ICD-10-CM | POA: Diagnosis not present

## 2017-05-01 DIAGNOSIS — I120 Hypertensive chronic kidney disease with stage 5 chronic kidney disease or end stage renal disease: Secondary | ICD-10-CM | POA: Diagnosis not present

## 2017-05-01 DIAGNOSIS — Z7982 Long term (current) use of aspirin: Secondary | ICD-10-CM | POA: Insufficient documentation

## 2017-05-01 DIAGNOSIS — I10 Essential (primary) hypertension: Secondary | ICD-10-CM

## 2017-05-01 DIAGNOSIS — N186 End stage renal disease: Secondary | ICD-10-CM | POA: Insufficient documentation

## 2017-05-01 DIAGNOSIS — J449 Chronic obstructive pulmonary disease, unspecified: Secondary | ICD-10-CM | POA: Diagnosis not present

## 2017-05-01 LAB — CBC WITH DIFFERENTIAL/PLATELET
BASOS ABS: 0 10*3/uL (ref 0–0.1)
Basophils Relative: 1 %
Eosinophils Absolute: 0.1 10*3/uL (ref 0–0.7)
Eosinophils Relative: 3 %
HEMATOCRIT: 30.1 % — AB (ref 40.0–52.0)
HEMOGLOBIN: 9.9 g/dL — AB (ref 13.0–18.0)
LYMPHS PCT: 28 %
Lymphs Abs: 1.5 10*3/uL (ref 1.0–3.6)
MCH: 28.5 pg (ref 26.0–34.0)
MCHC: 32.7 g/dL (ref 32.0–36.0)
MCV: 87.2 fL (ref 80.0–100.0)
MONO ABS: 0.8 10*3/uL (ref 0.2–1.0)
Monocytes Relative: 16 %
NEUTROS ABS: 2.8 10*3/uL (ref 1.4–6.5)
NEUTROS PCT: 52 %
Platelets: 187 10*3/uL (ref 150–440)
RBC: 3.46 MIL/uL — AB (ref 4.40–5.90)
RDW: 16.4 % — ABNORMAL HIGH (ref 11.5–14.5)
WBC: 5.3 10*3/uL (ref 3.8–10.6)

## 2017-05-01 LAB — BASIC METABOLIC PANEL
ANION GAP: 15 (ref 5–15)
BUN: 108 mg/dL — ABNORMAL HIGH (ref 6–20)
CO2: 14 mmol/L — ABNORMAL LOW (ref 22–32)
Calcium: 8 mg/dL — ABNORMAL LOW (ref 8.9–10.3)
Chloride: 109 mmol/L (ref 101–111)
Creatinine, Ser: 17.79 mg/dL — ABNORMAL HIGH (ref 0.61–1.24)
GFR calc non Af Amer: 3 mL/min — ABNORMAL LOW (ref >60)
GFR, EST AFRICAN AMERICAN: 3 mL/min — AB (ref >60)
GLUCOSE: 121 mg/dL — AB (ref 65–99)
POTASSIUM: 4.8 mmol/L (ref 3.5–5.1)
Sodium: 138 mmol/L (ref 135–145)

## 2017-05-01 MED ORDER — ENALAPRIL MALEATE 10 MG PO TABS
20.0000 mg | ORAL_TABLET | Freq: Once | ORAL | Status: AC
  Administered 2017-05-01: 20 mg via ORAL
  Filled 2017-05-01: qty 2

## 2017-05-01 MED ORDER — ENALAPRIL MALEATE 10 MG PO TABS: 10.0000 mg | ORAL_TABLET | Freq: Every day | ORAL | 0 refills | Status: DC

## 2017-05-01 MED ORDER — AMLODIPINE BESYLATE 10 MG PO TABS: 10.0000 mg | ORAL_TABLET | Freq: Every day | ORAL | 0 refills | Status: AC

## 2017-05-01 MED ORDER — CARVEDILOL 25 MG PO TABS
25.0000 mg | ORAL_TABLET | Freq: Once | ORAL | Status: AC
  Administered 2017-05-01: 25 mg via ORAL
  Filled 2017-05-01: qty 1

## 2017-05-01 MED ORDER — CARVEDILOL 25 MG PO TABS: 25.0000 mg | ORAL_TABLET | Freq: Two times a day (BID) | ORAL | 0 refills | Status: AC

## 2017-05-01 MED ORDER — HYDRALAZINE HCL 50 MG PO TABS
100.0000 mg | ORAL_TABLET | Freq: Once | ORAL | Status: AC
  Administered 2017-05-01: 100 mg via ORAL
  Filled 2017-05-01: qty 2

## 2017-05-01 MED ORDER — HYDRALAZINE HCL 100 MG PO TABS: 100.0000 mg | ORAL_TABLET | Freq: Four times a day (QID) | ORAL | 0 refills | Status: AC

## 2017-05-01 MED ORDER — FUROSEMIDE 40 MG PO TABS: 40.0000 mg | ORAL_TABLET | Freq: Every day | ORAL | 0 refills | Status: DC

## 2017-05-01 NOTE — ED Notes (Signed)
Pt d/c'd from ER.  Refused prescription medication for BP and AVS.  Pt extremely frustrated that he couldn't get dialysis today.  He states " the doctor that saw me here is not my doctor and she can't tell me what to do and who to see".  Pt aware BP high and that he needs to follow up with someone regarding that.  Pt aware to make appt with [facility] nephrology for a f/u and he refused this as well. Pt signed to be discharged and walked out without getting updated VS.  MD aware.

## 2017-05-01 NOTE — Discharge Instructions (Signed)
You are evaluated for chronic kidney disease and whether or not you needed emergency dialysis, and your blood work does not indicate need for emergency dialysis today.  Recommend that you follow-up with [facility] nephrology as soon as possible next week.  In the interim, you should return to the ER for laboratory studies and 24 hours to recheck your potassium to find out if you do need emergency dialysis, because if you do meet criteria for emergency dialysis, you would of course be dialyzed here in the emergency department.  Return to the emergency room immediately for any chest pain, shortness breath or trouble breathing, confusion or altered mental status, weakness, numbness, or any other symptoms concerning to you.
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
## 2017-05-01 NOTE — ED Notes (Signed)
Pt given warm blanket, sandwich tray, and apple juice

## 2017-05-01 NOTE — ED Triage Notes (Signed)
Here today for dialysis treatment.  Reports received Tuesday but per chart last got 2/21. Has had some SHOB.

## 2017-05-14 DIAGNOSIS — R079 Chest pain, unspecified: Secondary | ICD-10-CM | POA: Insufficient documentation

## 2017-06-19 DIAGNOSIS — I10 Essential (primary) hypertension: Secondary | ICD-10-CM | POA: Insufficient documentation

## 2017-06-19 DIAGNOSIS — F1210 Cannabis abuse, uncomplicated: Secondary | ICD-10-CM | POA: Insufficient documentation

## 2017-06-19 DIAGNOSIS — F17200 Nicotine dependence, unspecified, uncomplicated: Secondary | ICD-10-CM | POA: Insufficient documentation

## 2017-06-20 DIAGNOSIS — F69 Unspecified disorder of adult personality and behavior: Secondary | ICD-10-CM | POA: Insufficient documentation

## 2017-06-20 DIAGNOSIS — I5030 Unspecified diastolic (congestive) heart failure: Secondary | ICD-10-CM | POA: Insufficient documentation

## 2017-11-05 DIAGNOSIS — I2510 Atherosclerotic heart disease of native coronary artery without angina pectoris: Secondary | ICD-10-CM | POA: Insufficient documentation

## 2018-03-25 ENCOUNTER — Emergency Department: Payer: Medicare Other

## 2018-03-25 ENCOUNTER — Encounter: Payer: Self-pay | Admitting: Emergency Medicine

## 2018-03-25 ENCOUNTER — Inpatient Hospital Stay
Admission: EM | Admit: 2018-03-25 | Discharge: 2018-03-26 | DRG: 304 | Disposition: A | Discharge status: Home / Self Care | Payer: Medicare Other | Attending: Internal Medicine | Admitting: Internal Medicine

## 2018-03-25 ENCOUNTER — Other Ambulatory Visit: Payer: Self-pay

## 2018-03-25 DIAGNOSIS — I2510 Atherosclerotic heart disease of native coronary artery without angina pectoris: Secondary | ICD-10-CM | POA: Diagnosis present

## 2018-03-25 DIAGNOSIS — Z9119 Patient's noncompliance with other medical treatment and regimen: Secondary | ICD-10-CM

## 2018-03-25 DIAGNOSIS — J449 Chronic obstructive pulmonary disease, unspecified: Secondary | ICD-10-CM | POA: Diagnosis present

## 2018-03-25 DIAGNOSIS — N186 End stage renal disease: Secondary | ICD-10-CM

## 2018-03-25 DIAGNOSIS — G4730 Sleep apnea, unspecified: Secondary | ICD-10-CM | POA: Diagnosis present

## 2018-03-25 DIAGNOSIS — Z992 Dependence on renal dialysis: Secondary | ICD-10-CM | POA: Diagnosis not present

## 2018-03-25 DIAGNOSIS — I160 Hypertensive urgency: Secondary | ICD-10-CM | POA: Diagnosis present

## 2018-03-25 DIAGNOSIS — R7989 Other specified abnormal findings of blood chemistry: Secondary | ICD-10-CM | POA: Diagnosis present

## 2018-03-25 DIAGNOSIS — D631 Anemia in chronic kidney disease: Secondary | ICD-10-CM | POA: Diagnosis present

## 2018-03-25 DIAGNOSIS — Z79899 Other long term (current) drug therapy: Secondary | ICD-10-CM | POA: Diagnosis not present

## 2018-03-25 DIAGNOSIS — Z7902 Long term (current) use of antithrombotics/antiplatelets: Secondary | ICD-10-CM

## 2018-03-25 DIAGNOSIS — Z888 Allergy status to other drugs, medicaments and biological substances status: Secondary | ICD-10-CM

## 2018-03-25 DIAGNOSIS — Z7982 Long term (current) use of aspirin: Secondary | ICD-10-CM | POA: Diagnosis not present

## 2018-03-25 DIAGNOSIS — N2581 Secondary hyperparathyroidism of renal origin: Secondary | ICD-10-CM | POA: Diagnosis present

## 2018-03-25 DIAGNOSIS — F602 Antisocial personality disorder: Secondary | ICD-10-CM | POA: Diagnosis present

## 2018-03-25 DIAGNOSIS — Z886 Allergy status to analgesic agent status: Secondary | ICD-10-CM

## 2018-03-25 DIAGNOSIS — Z9115 Patient's noncompliance with renal dialysis: Secondary | ICD-10-CM | POA: Diagnosis not present

## 2018-03-25 DIAGNOSIS — I252 Old myocardial infarction: Secondary | ICD-10-CM

## 2018-03-25 DIAGNOSIS — Z8673 Personal history of transient ischemic attack (TIA), and cerebral infarction without residual deficits: Secondary | ICD-10-CM

## 2018-03-25 DIAGNOSIS — I120 Hypertensive chronic kidney disease with stage 5 chronic kidney disease or end stage renal disease: Secondary | ICD-10-CM | POA: Diagnosis present

## 2018-03-25 DIAGNOSIS — R0902 Hypoxemia: Secondary | ICD-10-CM | POA: Diagnosis present

## 2018-03-25 DIAGNOSIS — E785 Hyperlipidemia, unspecified: Secondary | ICD-10-CM | POA: Diagnosis present

## 2018-03-25 DIAGNOSIS — E875 Hyperkalemia: Secondary | ICD-10-CM | POA: Diagnosis present

## 2018-03-25 DIAGNOSIS — R778 Other specified abnormalities of plasma proteins: Secondary | ICD-10-CM

## 2018-03-25 DIAGNOSIS — F17210 Nicotine dependence, cigarettes, uncomplicated: Secondary | ICD-10-CM | POA: Diagnosis present

## 2018-03-25 LAB — COMPREHENSIVE METABOLIC PANEL
ALBUMIN: 4.3 g/dL (ref 3.5–5.0)
ALK PHOS: 70 U/L (ref 38–126)
ALT: 20 U/L (ref 0–44)
AST: 21 U/L (ref 15–41)
Anion gap: 19 — ABNORMAL HIGH (ref 5–15)
BILIRUBIN TOTAL: 0.9 mg/dL (ref 0.3–1.2)
BUN: 156 mg/dL — ABNORMAL HIGH (ref 6–20)
CALCIUM: 8.7 mg/dL — AB (ref 8.9–10.3)
CO2: 13 mmol/L — ABNORMAL LOW (ref 22–32)
CREATININE: 29.88 mg/dL — AB (ref 0.61–1.24)
Chloride: 108 mmol/L (ref 98–111)
GFR calc Af Amer: 2 mL/min — ABNORMAL LOW (ref >60)
GFR calc non Af Amer: 1 mL/min — ABNORMAL LOW (ref >60)
GLUCOSE: 93 mg/dL (ref 70–99)
Potassium: 6.3 mmol/L (ref 3.5–5.1)
Sodium: 140 mmol/L (ref 135–145)
TOTAL PROTEIN: 8 g/dL (ref 6.5–8.1)

## 2018-03-25 LAB — BASIC METABOLIC PANEL
Anion gap: 13 (ref 5–15)
BUN: 74 mg/dL — AB (ref 6–20)
CO2: 22 mmol/L (ref 22–32)
Calcium: 8.4 mg/dL — ABNORMAL LOW (ref 8.9–10.3)
Chloride: 101 mmol/L (ref 98–111)
Creatinine, Ser: 15.55 mg/dL — ABNORMAL HIGH (ref 0.61–1.24)
GFR calc Af Amer: 4 mL/min — ABNORMAL LOW (ref >60)
GFR calc non Af Amer: 3 mL/min — ABNORMAL LOW (ref >60)
Glucose, Bld: 111 mg/dL — ABNORMAL HIGH (ref 70–99)
Potassium: 3.2 mmol/L — ABNORMAL LOW (ref 3.5–5.1)
Sodium: 136 mmol/L (ref 135–145)

## 2018-03-25 LAB — CBC
HEMATOCRIT: 32.5 % — AB (ref 39.0–52.0)
HEMOGLOBIN: 10.7 g/dL — AB (ref 13.0–17.0)
MCH: 30.8 pg (ref 26.0–34.0)
MCHC: 32.9 g/dL (ref 30.0–36.0)
MCV: 93.7 fL (ref 80.0–100.0)
Platelets: 201 10*3/uL (ref 150–400)
RBC: 3.47 MIL/uL — AB (ref 4.22–5.81)
RDW: 15.3 % (ref 11.5–15.5)
WBC: 7.9 10*3/uL (ref 4.0–10.5)
nRBC: 0 % (ref 0.0–0.2)

## 2018-03-25 LAB — TROPONIN I
Troponin I: 0.12 ng/mL (ref <0.03)
Troponin I: 0.12 ng/mL (ref <0.03)

## 2018-03-25 LAB — MRSA PCR SCREENING: MRSA by PCR: NEGATIVE

## 2018-03-25 LAB — MAGNESIUM: Magnesium: 2 mg/dL (ref 1.7–2.4)

## 2018-03-25 LAB — PHOSPHORUS: Phosphorus: 3.9 mg/dL (ref 2.5–4.6)

## 2018-03-25 LAB — GLUCOSE, CAPILLARY: Glucose-Capillary: 94 mg/dL (ref 70–99)

## 2018-03-25 MED ORDER — SODIUM CHLORIDE 0.9 % IV SOLN: 1.0000 g | Freq: Once | INTRAVENOUS | Status: DC

## 2018-03-25 MED ORDER — ACETAMINOPHEN 325 MG PO TABS: 650.0000 mg | ORAL_TABLET | Freq: Four times a day (QID) | ORAL | Status: DC | PRN

## 2018-03-25 MED ORDER — ACETAMINOPHEN 650 MG RE SUPP: 650.0000 mg | Freq: Four times a day (QID) | RECTAL | Status: DC | PRN

## 2018-03-25 MED ORDER — GENERIC EXTERNAL MEDICATION
1.80 | Status: DC
Start: ? — End: 2018-03-25

## 2018-03-25 MED ORDER — CALCIUM GLUCONATE-NACL 1-0.675 GM/50ML-% IV SOLN
1.0000 g | Freq: Once | INTRAVENOUS | Status: AC
  Administered 2018-03-25: 1000 mg via INTRAVENOUS
  Filled 2018-03-25: qty 50

## 2018-03-25 MED ORDER — ADULT MULTIVITAMIN W/MINERALS CH
1.0000 | ORAL_TABLET | Freq: Every day | ORAL | Status: DC
  Administered 2018-03-25 – 2018-03-26 (×2): 1 via ORAL
  Filled 2018-03-25 (×2): qty 1

## 2018-03-25 MED ORDER — ASPIRIN 81 MG PO CHEW
324.0000 mg | CHEWABLE_TABLET | Freq: Once | ORAL | Status: AC
  Administered 2018-03-25: 324 mg via ORAL
  Filled 2018-03-25: qty 4

## 2018-03-25 MED ORDER — GENERIC EXTERNAL MEDICATION
1.90 | Status: DC
Start: ? — End: 2018-03-25

## 2018-03-25 MED ORDER — HEPARIN SODIUM (PORCINE) 1000 UNIT/ML IJ SOLN
3000.00 | INTRAMUSCULAR | Status: DC
Start: ? — End: 2018-03-25

## 2018-03-25 MED ORDER — HEPARIN SODIUM (PORCINE) 5000 UNIT/ML IJ SOLN
5000.0000 [IU] | Freq: Three times a day (TID) | INTRAMUSCULAR | Status: DC
  Filled 2018-03-25 (×2): qty 1

## 2018-03-25 MED ORDER — ONDANSETRON HCL 4 MG PO TABS: 4.0000 mg | ORAL_TABLET | Freq: Four times a day (QID) | ORAL | Status: DC | PRN

## 2018-03-25 MED ORDER — NICARDIPINE HCL IN NACL 20-0.86 MG/200ML-% IV SOLN
3.0000 mg/h | INTRAVENOUS | Status: DC
  Administered 2018-03-25 (×2): 5 mg/h via INTRAVENOUS
  Administered 2018-03-25: 10 mg/h via INTRAVENOUS
  Filled 2018-03-25 (×4): qty 200

## 2018-03-25 MED ORDER — ONDANSETRON HCL 4 MG/2ML IJ SOLN: 4.0000 mg | Freq: Four times a day (QID) | INTRAMUSCULAR | Status: DC | PRN

## 2018-03-25 MED ORDER — NICARDIPINE HCL IN NACL 20-0.86 MG/200ML-% IV SOLN
3.0000 mg/h | INTRAVENOUS | Status: DC
  Administered 2018-03-25: 10 mg/h via INTRAVENOUS
  Administered 2018-03-26 (×2): 7.5 mg/h via INTRAVENOUS
  Filled 2018-03-25 (×5): qty 200

## 2018-03-25 MED ORDER — DEXTROSE 50 % IV SOLN
1.0000 | Freq: Once | INTRAVENOUS | Status: AC
  Administered 2018-03-25: 50 mL via INTRAVENOUS
  Filled 2018-03-25: qty 50

## 2018-03-25 MED ORDER — IPRATROPIUM-ALBUTEROL 0.5-2.5 (3) MG/3ML IN SOLN
3.0000 mL | Freq: Once | RESPIRATORY_TRACT | Status: AC
  Administered 2018-03-25: 3 mL via RESPIRATORY_TRACT
  Filled 2018-03-25: qty 3

## 2018-03-25 MED ORDER — CLOPIDOGREL BISULFATE 75 MG PO TABS
75.0000 mg | ORAL_TABLET | Freq: Every day | ORAL | Status: DC
  Administered 2018-03-25 – 2018-03-26 (×2): 75 mg via ORAL
  Filled 2018-03-25 (×2): qty 1

## 2018-03-25 MED ORDER — NITROGLYCERIN 0.4 MG SL SUBL
0.4000 mg | SUBLINGUAL_TABLET | SUBLINGUAL | Status: DC | PRN
  Administered 2018-03-25: 0.4 mg via SUBLINGUAL
  Filled 2018-03-25: qty 1

## 2018-03-25 MED ORDER — HYDRALAZINE HCL 20 MG/ML IJ SOLN
10.0000 mg | Freq: Once | INTRAMUSCULAR | Status: AC
Start: 2018-03-25 — End: 2018-03-25
  Administered 2018-03-25: 10 mg via INTRAVENOUS
  Filled 2018-03-25: qty 1

## 2018-03-25 MED ORDER — AMLODIPINE BESYLATE 10 MG PO TABS
10.0000 mg | ORAL_TABLET | Freq: Every day | ORAL | Status: DC
  Administered 2018-03-25 – 2018-03-26 (×2): 10 mg via ORAL
  Filled 2018-03-25 (×2): qty 1

## 2018-03-25 MED ORDER — CARVEDILOL 12.5 MG PO TABS
25.0000 mg | ORAL_TABLET | Freq: Two times a day (BID) | ORAL | Status: DC
  Administered 2018-03-25 – 2018-03-26 (×2): 25 mg via ORAL
  Filled 2018-03-25 (×2): qty 2

## 2018-03-25 MED ORDER — ASPIRIN EC 81 MG PO TBEC
81.0000 mg | DELAYED_RELEASE_TABLET | Freq: Every day | ORAL | Status: DC
  Administered 2018-03-26: 81 mg via ORAL
  Filled 2018-03-25: qty 1

## 2018-03-25 MED ORDER — HYDRALAZINE HCL 50 MG PO TABS
100.0000 mg | ORAL_TABLET | Freq: Three times a day (TID) | ORAL | Status: DC
  Administered 2018-03-25 – 2018-03-26 (×2): 100 mg via ORAL
  Filled 2018-03-25 (×4): qty 2

## 2018-03-25 MED ORDER — CHLORHEXIDINE GLUCONATE CLOTH 2 % EX PADS: 6.0000 | MEDICATED_PAD | Freq: Every day | CUTANEOUS | Status: DC

## 2018-03-25 MED ORDER — CALCIUM ACETATE (PHOS BINDER) 667 MG PO CAPS
1334.0000 mg | ORAL_CAPSULE | Freq: Three times a day (TID) | ORAL | Status: DC
  Administered 2018-03-25 – 2018-03-26 (×3): 1334 mg via ORAL
  Filled 2018-03-25 (×5): qty 2

## 2018-03-25 MED ORDER — INSULIN ASPART 100 UNIT/ML ~~LOC~~ SOLN
10.0000 [IU] | Freq: Once | SUBCUTANEOUS | Status: AC
  Administered 2018-03-25: 10 [IU] via INTRAVENOUS
  Filled 2018-03-25: qty 1

## 2018-03-25 MED ORDER — ATORVASTATIN CALCIUM 20 MG PO TABS
40.0000 mg | ORAL_TABLET | Freq: Every day | ORAL | Status: DC
  Administered 2018-03-25 – 2018-03-26 (×2): 40 mg via ORAL
  Filled 2018-03-25 (×2): qty 2

## 2018-03-25 MED ORDER — NITROGLYCERIN 2 % TD OINT
1.0000 [in_us] | TOPICAL_OINTMENT | Freq: Once | TRANSDERMAL | Status: AC
  Administered 2018-03-25: 1 [in_us] via TOPICAL
  Filled 2018-03-25: qty 1

## 2018-03-25 MED ORDER — ISOSORBIDE DINITRATE 30 MG PO TABS
30.0000 mg | ORAL_TABLET | Freq: Three times a day (TID) | ORAL | Status: DC
  Administered 2018-03-25 – 2018-03-26 (×2): 30 mg via ORAL
  Filled 2018-03-25 (×6): qty 1

## 2018-03-25 MED ORDER — LABETALOL HCL 5 MG/ML IV SOLN
10.0000 mg | INTRAVENOUS | Status: DC | PRN
  Administered 2018-03-25: 10 mg via INTRAVENOUS
  Filled 2018-03-25: qty 4

## 2018-03-25 MED ORDER — SODIUM CHLORIDE 0.9 % IV SOLN
200.00 | INTRAVENOUS | Status: DC
Start: ? — End: 2018-03-25

## 2018-03-25 NOTE — Progress Notes (Signed)
Pt denies pain, A&O x4, BP elevated, tx started without problem.   03/25/18 1602  Vital Signs  Temp 97.8 F (36.6 C)  Temp Source Oral  Pulse Rate Source Monitor  BP (!) 161/94  BP Location Right Arm  BP Method Automatic  Patient Position (if appropriate) Lying  During Hemodialysis Assessment  Blood Flow Rate (mL/min) 400 mL/min  Arterial Pressure (mmHg) -110 mmHg  Venous Pressure (mmHg) 100 mmHg  Transmembrane Pressure (mmHg) 90 mmHg  Ultrafiltration Rate (mL/min) 860 mL/min  Dialysate Flow Rate (mL/min) 800 ml/min  Conductivity: Machine  140  HD Safety Checks Performed Yes  Dialysis Fluid Bolus Normal Saline  Bolus Amount (mL) 250 mL  Dialysate Change  (Na 140)  Education / Care Plan  Dialysis Education Provided No (Comment) (Pt not ready for education, drowsy, wants to sleep.)
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
## 2018-03-25 NOTE — Progress Notes (Signed)
Pre dialysis assessment.   03/25/18 1601  Neurological  Level of Consciousness Alert  Orientation Level Oriented X4  Respiratory  Respiratory Pattern Regular;Unlabored;Symmetrical  Chest Assessment Chest expansion symmetrical  Bilateral Breath Sounds Clear;Diminished  R Upper  Breath Sounds Clear  L Upper Breath Sounds Clear  R Lower Breath Sounds Diminished  L Lower Breath Sounds Diminished  Cough None  Cardiac  Pulse Regular  Heart Sounds S1, S2  Jugular Venous Distention (JVD) No  ECG Monitor Yes  Cardiac Rhythm NSR  Vascular  R Radial Pulse +2  L Radial Pulse +2  R Dorsalis Pedis Pulse +1  L Dorsalis Pedis Pulse +1  Musculoskeletal  Musculoskeletal (WDL) X  Generalized Weakness Yes  Gastrointestinal  Bowel Sounds Assessment Active  Last BM Date 03/25/18  GU Assessment  Genitourinary (WDL) WDL  Psychosocial  Psychosocial (WDL) X  Patient Behaviors Appropriate for situation  Needs Expressed Emotional  Emotional support given Given to patient

## 2018-03-25 NOTE — Progress Notes (Signed)
[facility]  ROUNDING NOTE   Subjective:   [name] admitted to [facility] ICU for Hyperkalemia [E87.5] Hypoxia [R09.02] Elevated troponin [R79.89] Hypertensive urgency [I16.0]   Last complete dialysis was 9 days ago. Incomplete treatment on 1/22.   Objective:  Vital signs in last 24 hours:  Temp:  [97.5 F (36.4 C)-97.8 F (36.6 C)] 97.8 F (36.6 C) (01/24 1155) Pulse Rate:  [78-89] 82 (01/24 1200) Resp:  [15-26] 15 (01/24 1200) BP: (195-230)/(106-133) 195/106 (01/24 1200) SpO2:  [96 %-98 %] 97 % (01/24 1200) Weight:  [93.5 kg-98.9 kg] 98.9 kg (01/24 1155)  Weight change:  Filed Weights   03/25/18 0752 03/25/18 1155  Weight: 93.5 kg 98.9 kg    Intake/Output: No intake/output data recorded.   Intake/Output this shift:  Total I/O In: 88.5 [I.V.:38.5; IV Piggyback:50] Out: -   Physical Exam: General: NAD,   Head: Normocephalic, atraumatic. Moist oral mucosal membranes  Eyes: Anicteric, PERRL  Neck: Supple, trachea midline  Lungs:  Clear to auscultation  Heart: Regular rate and rhythm  Abdomen:  Soft, nontender,   Extremities:  no peripheral edema.  Neurologic: Nonfocal, moving all four extremities  Skin: No lesions  Access: RIJ    Basic Metabolic Panel: Recent Labs  Lab 03/25/18 0922  NA 140  K 6.3*  CL 108  CO2 13*  GLUCOSE 93  BUN 156*  CREATININE 29.88*  CALCIUM 8.7*    Liver Function Tests: Recent Labs  Lab 03/25/18 0922  AST 21  ALT 20  ALKPHOS 70  BILITOT 0.9  PROT 8.0  ALBUMIN 4.3   No results for input(s): LIPASE, AMYLASE in the last 168 hours. No results for input(s): AMMONIA in the last 168 hours.  CBC: Recent Labs  Lab 03/25/18 0922  WBC 7.9  HGB 10.7*  HCT 32.5*  MCV 93.7  PLT 201    Cardiac Enzymes: Recent Labs  Lab 03/25/18 0922  TROPONINI 0.12*    BNP: Invalid input(s): POCBNP  CBG: No results for input(s): GLUCAP in the last 168 hours.  Microbiology: Results for orders placed or  performed during the hospital encounter of 01/12/17  MRSA PCR Screening     Status: None   Collection Time: 01/12/17 11:22 AM  Result Value Ref Range Status   MRSA by PCR NEGATIVE NEGATIVE Final    Comment:        The GeneXpert MRSA Assay (FDA approved for NASAL specimens only), is one component of a comprehensive MRSA colonization surveillance program. It is not intended to diagnose MRSA infection nor to guide or monitor treatment for MRSA infections.     Coagulation Studies: No results for input(s): LABPROT, INR in the last 72 hours.  Urinalysis: No results for input(s): COLORURINE, LABSPEC, PHURINE, GLUCOSEU, HGBUR, BILIRUBINUR, KETONESUR, PROTEINUR, UROBILINOGEN, NITRITE, LEUKOCYTESUR in the last 72 hours.  Invalid input(s): APPERANCEUR    Imaging: Dg Chest Port 1 View  Result Date: 03/25/2018 CLINICAL DATA:  Shortness of breath.  Renal failure. EXAM: PORTABLE CHEST 1 VIEW COMPARISON:  March 21, 2017 FINDINGS: There is cardiomegaly with mild pulmonary venous hypertension. No edema or consolidation. Central catheter tip is in the right atrium slightly beyond the cavoatrial junction. No pneumothorax. No bone lesions. IMPRESSION: Cardiomegaly with a degree of pulmonary vascular congestion. No edema or consolidation. Central catheter tip in right atrium. Electronically Signed   By: Lowella Grip III M.D.   On: 03/25/2018 08:09     Medications:   . niCARDipine 7.5 mg/hr (03/25/18 1225)   .  amLODipine  10 mg Oral Daily  . aspirin EC  81 mg Oral Daily  . atorvastatin  40 mg Oral Daily  . calcium acetate  1,334 mg Oral TID WC  . carvedilol  25 mg Oral BID  . clopidogrel  75 mg Oral Daily  . heparin  5,000 Units Subcutaneous Q8H  . hydrALAZINE  100 mg Oral Q8H  . isosorbide dinitrate  30 mg Oral TID  . multivitamin with minerals  1 tablet Oral Daily   acetaminophen **OR** acetaminophen, labetalol, nitroGLYCERIN, ondansetron **OR** ondansetron (ZOFRAN)  IV  Assessment/ Plan:  [name] is a 48 y.o. black male with end stage renal disease on hemodialysis, hypertension, CVA, sleep apnea, coronary artery disease, COPD, anti-social personality disorder.   1. End Stage Renal Disease: currently without an outpatient unit. Last hemodialysis was on 1/22 at [facility] where he did not complete his treatment due to catheter malfunction. He received TPA dwell but became violent and had police escort him out of dialysis.  Urgent dialysis requested now due to hypertensive urgency and hyperkalemia - Orders prepared. 2K bath. 3.5 hour treatment. Using RIJ permcath.   2. Hypertension: noncompliant with home regimen - nicardipine gtt - Home regimen restarted: amlodipine, carvedilol, hydralazine, isosorbide mononitrate.   3. Anemia of chronic kidney disease: hemoglobin 10.7. No indication for ESA.   4. Secondary Hyperparathyroidism: elevated on 02/28/18 at 737 - calcium acetate ordered.    LOS: 0 Fernando Salas 1/24/202012:36 PM

## 2018-03-25 NOTE — ED Notes (Signed)
Dr. Posey Pronto already aware of pt's elevated BP, pt will start on cardene drip.

## 2018-03-25 NOTE — Progress Notes (Signed)
HD RN back in room to draw stat labs.

## 2018-03-25 NOTE — ED Triage Notes (Signed)
Pt arrived via EMS from home with reports of shortness of breath over the past week. Pt is on dialysis, has not had a treatment since Wednesday 1/15.  Pt states he has not had an appetite since then either. Pt states that he stopped going to dialysis because they are taking his blood and not giving it back.    Pt states he also has been having diarrhea as well.    Pt denies any pain at this time.

## 2018-03-25 NOTE — Progress Notes (Signed)
   03/25/18 1500  Clinical Encounter Type  Visited With Patient and family together  Visit Type Initial  Referral From Chaplain  Consult/Referral To Chaplain  Spiritual Encounters  Spiritual Needs Prayer;Emotional  Stress Factors  Patient Stress Factors Exhausted  Chaplain was rounding and stop to visit patient. Chaplain introduced herself and patient seemed glad. Chaplain told patient she wanted to check on him and see how he was doing. Patient began to express his concerns for his health. Patient is not clear on why the treatments he receives in [place] at [employer]. He says they have been taking his blood and not get giving it back. Patient told Chaplain he has had fifteen heart attacks, eight of them were massive. Chaplain gave emotional support and encouragement as the patient expression his faith in God. Patient shared how he believes in God and his faith has carried him through everything he has experienced concerning his health. Chaplain offered prayer and inviting his girlfriend to come in and share in the prayer. Chaplain prayed and patient appreciated prayer.

## 2018-03-25 NOTE — Care Management (Signed)
Fernando Salas is aware of patient admission and room number.

## 2018-03-25 NOTE — Progress Notes (Signed)
Pt sleeping, no signs of distress.   03/25/18 1700  During Hemodialysis Assessment  Blood Flow Rate (mL/min) 400 mL/min  Arterial Pressure (mmHg) -120 mmHg  Venous Pressure (mmHg) 100 mmHg  Transmembrane Pressure (mmHg) 90 mmHg  Ultrafiltration Rate (mL/min) 860 mL/min  Dialysate Flow Rate (mL/min) 800 ml/min  Conductivity: Machine  14  HD Safety Checks Performed Yes  Intra-Hemodialysis Comments Progressing as prescribed

## 2018-03-25 NOTE — Progress Notes (Signed)
Pt resting comfortably.   03/25/18 1630  Vital Signs  Pulse Rate 78  Resp (!) 28  BP (!) 161/94  During Hemodialysis Assessment  Blood Flow Rate (mL/min) 400 mL/min  Arterial Pressure (mmHg) -120 mmHg  Venous Pressure (mmHg) 100 mmHg  Transmembrane Pressure (mmHg) 90 mmHg  Ultrafiltration Rate (mL/min) 860 mL/min  Dialysate Flow Rate (mL/min) 800 ml/min  Conductivity: Machine  14  HD Safety Checks Performed Yes  Intra-Hemodialysis Comments Progressing as prescribed

## 2018-03-25 NOTE — ED Notes (Signed)
Valerie RN in room to try to start IV.

## 2018-03-25 NOTE — Progress Notes (Signed)
Pt sleeping comfortably.   03/25/18 1715  Vital Signs  BP Location Right Arm  BP Method Automatic  Patient Position (if appropriate) Lying  During Hemodialysis Assessment  Blood Flow Rate (mL/min) 400 mL/min  Arterial Pressure (mmHg) -130 mmHg  Venous Pressure (mmHg) 110 mmHg  Transmembrane Pressure (mmHg) 90 mmHg  Ultrafiltration Rate (mL/min) 860 mL/min  Dialysate Flow Rate (mL/min) 800 ml/min  Conductivity: Machine  13.9  HD Safety Checks Performed Yes  Intra-Hemodialysis Comments Progressing as prescribed (1019 mls removed so far)

## 2018-03-25 NOTE — ED Provider Notes (Signed)
[facility] Emergency Department Provider Note  ____________________________________________  Time seen: Approximately 8:22 AM  I have reviewed the triage vital signs and the nursing notes.   HISTORY  Chief Complaint Shortness of Breath    HPI Fernando Salas is a 48 y.o. male with ESRD on HD, CAD status post MI, COPD, CVA, presenting with shortness of breath.  The patient reports he last had dialysis 9 days ago and that he does not go because "they take my blood and do not give it back."  Over the last several days, he has had progressively worsening shortness of breath, worse with exertion.  He denies any associated chest pain, palpitations, lightheadedness or syncope.  He has had several episodes of nonbloody watery diarrhea without any nausea or vomiting, or abdominal pain.  No fevers or chills.  Past Medical History:  Diagnosis Date  . CAD (coronary artery disease)    status post multiple myocardial infarctions  . Chronic kidney disease   . COPD (chronic obstructive pulmonary disease) ([place])   . Hyperlipidemia   . Hypertension   . Myocardial infarction ([place])    " I had 13 heart attacks, 8 massive"  . Proteinuria   . Renal insufficiency   . Secondary hyperparathyroidism of renal origin ([place])   . Shortness of breath dyspnea    " when I drink a cup of cold water too fast"  . Sleep apnea    pt stated " long story" does not wear CPAP  . Stroke [facility])     Patient Active Problem List   Diagnosis Date Noted  . Pulmonary edema 03/09/2017  . 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 Fluid overload 02/08/2017  . Accelerated hypertension 01/12/2017  . AKI (acute kidney injury) ([place])   . Unstable angina ([place]) 11/01/2016    Past Surgical History:  Procedure Laterality Date  . AV FISTULA PLACEMENT Left 01-24-14   By Dr. Hortencia Pilar in [place]  . AV FISTULA PLACEMENT Right 04/30/2014   Procedure: RIGHT BRACHIOCEPHALIC ARTERIOVENOUS (AV) FISTULA  CREATION;  Surgeon: Elam Dutch, MD;  Location: [facility] OR;  Service: Vascular;  Laterality: Right;  . AV FISTULA PLACEMENT Left 05/31/2014   Procedure: Left INSERTION OF ARTERIOVENOUS (AV) GORE-TEX GRAFT ARM;  Surgeon: Elam Dutch, MD;  Location: [place];  Service: Vascular;  Laterality: Left;  . CARDIAC CATHETERIZATION    . CORONARY STENT PLACEMENT     13 stents total  . DIALYSIS/PERMA CATHETER INSERTION N/A 01/15/2017   Procedure: DIALYSIS/PERMA CATHETER INSERTION;  Surgeon: Katha Cabal, MD;  Location: [place] CV LAB;  Service: Cardiovascular;  Laterality: N/A;    Current Outpatient Rx  . Order #: [id number] Class: Print  . Order #: [id number] Class: Print  . Order #: [id number] Class: Historical Med  . Order #: [id number] Class: Print  . Order #: [id number] Class: Print  . Order #: [id number] Class: Print  . Order #: [id number] Class: Historical Med  . Order #: [id number] Class: Historical Med  . Order #: [id number] Class: Print  . Order #: [id number] Class: Print  . Order #: [id number] Class: Normal  . Order #: [id number] Class: Historical Med  . Order #: [id number] Class: Print  . Order #: [id number] Class: Normal  . Order #: [id number] Class: Historical Med    Allergies Ibuprofen; Viagra [sildenafil]; and Clonidine derivatives  Family History  Problem Relation Age of Onset  . Heart disease Mother   . Hyperlipidemia Mother   . Hypertension Mother   . Kidney disease Father   . Kidney disease Brother   .  Hyperlipidemia Sister   . Hyperlipidemia Daughter   . Hypertension Sister   . Hypertension Daughter     Social History Social History   Tobacco Use  . Smoking status: Current Every Day Smoker    Packs/day: 0.25    Years: 25.00    Pack years: 6.25    Types: Cigarettes  . Smokeless tobacco: Never Used  Substance Use Topics  . Alcohol use: No    Alcohol/week: 0.0 standard drinks    Frequency: Never    Comment: occasional  . Drug use: Yes    Types: Marijuana    Comment:  daily    Review of Systems Constitutional: No fever/chills.  No lightheadedness or syncope. Eyes: No visual changes. ENT: No sore throat. No congestion or rhinorrhea. Cardiovascular: Denies chest pain. Denies palpitations. Respiratory: Positive shortness of breath.  No cough. Gastrointestinal: No abdominal pain.  No nausea, no vomiting.  Positive diarrhea.  No constipation. Genitourinary: Anuric Musculoskeletal: Negative for back pain. Skin: Negative for rash. Neurological: Negative for headaches. No focal numbness, tingling or weakness.     ____________________________________________   PHYSICAL EXAM:  VITAL SIGNS: ED Triage Vitals  Enc Vitals Group     BP 03/25/18 0755 (!) 230/133     Pulse Rate 03/25/18 0755 89     Resp 03/25/18 0755 20     Temp 03/25/18 0755 (!) 97.5 F (36.4 C)     Temp Source 03/25/18 0755 Oral     SpO2 03/25/18 0755 96 %     Weight 03/25/18 0752 206 lb 2.1 oz (93.5 kg)     Height 03/25/18 0752 6' (1.829 m)     Head Circumference --      Peak Flow --      Pain Score 03/25/18 0752 0     Pain Loc --      Pain Edu? --      Excl. in [place]? --     Constitutional: Alert and oriented. Answers questions appropriately.  Chronically ill-appearing. Eyes: Conjunctivae are normal.  EOMI. No scleral icterus. Head: Atraumatic. Nose: No congestion/rhinnorhea. Mouth/Throat: Mucous membranes are dry.  Neck: No stridor.  Supple.  Positive JVD.  No meningismus. Cardiovascular: Normal rate, regular rhythm. No murmurs, rubs or gallops.  Markedly hypertensive with an initial blood pressure of 230/133; on my exam the patient's blood pressure is 190/130. Respiratory: Normal respiratory effort.  No accessory muscle use or retractions.  Rales in the bases bilaterally.  O2 saturations are 91% on room air and improved to 97% on 2 L nasal cannula. Gastrointestinal: Soft, nontender and nondistended.  No guarding or rebound.  No peritoneal signs. Musculoskeletal: No LE edema.  No ttp in the calves or palpable cords.  Negative Homan's sign. Neurologic:  A&Ox3.  Speech is clear.  Face and smile are symmetric.  EOMI.  Moves all extremities well. Skin:  Skin is warm, dry and intact. No rash noted. Psychiatric: Patient has a very aggressive affect and is verbally abusive to the staff.  ____________________________________________   LABS (all labs ordered are listed, but only abnormal results are displayed)  Labs Reviewed  CBC - Abnormal; Notable for the following components:      Result Value   RBC 3.47 (*)    Hemoglobin 10.7 (*)    HCT 32.5 (*)    All other components within normal limits  TROPONIN I  COMPREHENSIVE METABOLIC PANEL   ____________________________________________  EKG  ED ECG REPORT I, Anne-Caroline Mariea Clonts, the attending physician, personally viewed and interpreted  this ECG.   Date: 03/25/2018  EKG Time: 754  Rate: 90  Rhythm: normal sinus rhythm  Axis: normal  Intervals:first-degree A-V block   ST&T Change: The patient does have peaked T waves in V3, as well as flipped T waves in V5 and V6.  ____________________________________________  RADIOLOGY  Dg Chest Port 1 View  Result Date: 03/25/2018 CLINICAL DATA:  Shortness of breath.  Renal failure. EXAM: PORTABLE CHEST 1 VIEW COMPARISON:  March 21, 2017 FINDINGS: There is cardiomegaly with mild pulmonary venous hypertension. No edema or consolidation. Central catheter tip is in the right atrium slightly beyond the cavoatrial junction. No pneumothorax. No bone lesions. IMPRESSION: Cardiomegaly with a degree of pulmonary vascular congestion. No edema or consolidation. Central catheter tip in right atrium. Electronically Signed   By: Lowella Grip III M.D.   On: 03/25/2018 08:09    ____________________________________________   PROCEDURES  Procedure(s) performed: None  Procedures  Critical Care performed: Yes ____________________________________________   INITIAL  IMPRESSION / ASSESSMENT AND PLAN / ED COURSE  Pertinent labs & imaging results that were available during my care of the patient were reviewed by me and considered in my medical decision making (see chart for details).  48 y.o. male with a history of ESRD on HD, significant vasculopath, presenting with shortness of breath.  The patient is markedly hypertensive, likely due to fluid overload and his underlying renal disease and hypertension.  I have ordered nitroglycerin paste for him at this time as the nurses were unable to get an IV for IV medication.  We will give him hydralazine once he has access.  I am concerned about some peaked T waves in his EKG and calcium gluconate has also been ordered.  The patient's EKG is concerning for hyperkalemia and I am awaiting those results.  He will likely require dialysis today.  His chest x-ray does not show a significant amount of edema but on my exam he does have rales in the bases and he is mildly hypoxic.  This is likely due to fluid overload, but cardiac causes including CHF, ACS or MI are not excluded.  The patient has been having some diarrhea but he has a benign abdominal examination and has not had any nausea or vomiting, fevers or chills; an acute intra-abdominal pathology is unlikely.  ----------------------------------------- 10:06 AM on 03/25/2018 -----------------------------------------  The patient's laboratory studies are concerning on multiple levels.  He has hyperkalemia and I do see some peaked T waves on his EKG; he has received calcium gluconate and I will give him insulin, glucose and albuterol to continue treating his hyperkalemia.  I have called the nephrologist for emergent dialysis.  In addition, the patient continues to be hypertensive although improving with nitroglycerin and hydralazine.  The definitive treatment for this hypertension is his dialysis.  The patient does have an elevated troponin today without any chest pain.  This is  likely due to hypertensive urgency and I will give him an aspirin.  Heparin is not indicated at this time.  The patient has been admitted to the hospitalist for continued evaluation and treatment.   CRITICAL CARE Performed by: Eula Listen   Total critical care time: 35 minutes  Critical care time was exclusive of separately billable procedures and treating other patients.  Critical care was necessary to treat or prevent imminent or life-threatening deterioration.  Critical care was time spent personally by me on the following activities: development of treatment plan with patient and/or surrogate as well as nursing, discussions  with consultants, evaluation of patient's response to treatment, examination of patient, obtaining history from patient or surrogate, ordering and performing treatments and interventions, ordering and review of laboratory studies, ordering and review of radiographic studies, pulse oximetry and re-evaluation of patient's condition.   ____________________________________________  FINAL CLINICAL IMPRESSION(S) / ED DIAGNOSES  Final diagnoses:  Hypertensive urgency  Hyperkalemia  Elevated troponin  Hypoxia         NEW MEDICATIONS STARTED DURING THIS VISIT:  New Prescriptions   No medications on file      Eula Listen, MD 03/25/18 1007

## 2018-03-25 NOTE — ED Notes (Signed)
Per Dr. Posey Pronto, pt will be admitted to ICU, pt refusing to be stuck for an IV again. Informed Dr. Posey Pronto pt only has one IV at this time.

## 2018-03-25 NOTE — H&P (Addendum)
[place] [facility] [place] NAME: Edith Lord    MR#:  [id number]  DATE OF BIRTH:  Oct 21, 1970  DATE OF ADMISSION:  03/25/2018  PRIMARY CARE PHYSICIAN: Patient, No Pcp Per   REQUESTING/REFERRING PHYSICIAN: Dr. Mariea Clonts  CHIEF COMPLAINT:   I need dialysis.  Patient's girlfriend in the ER HISTORY OF PRESENT ILLNESS:  Zakery Normington  is a 48 y.o. male with a known history of hypertension, end-stage renal disease on hemodialysis, COPD, hyperlipidemia, noncompliance to dialysis . Patient apparently has been discharged from [place] [facility] [place] and [facility] dialysis centers in the area due to recurrent threats to dialysis staff and thereby will need to go to local ED for his treatments. He was seen at [facility] emergency room on 122 2020 and according to the ER notes it seems patient was dialyzed which patient does not acknowledge over here with [place].  According to the [facility] ER notes patient was verbally abusive as well. I did discuss at length with patient and his girlfriend that he will respect the staff get his treatment over here.  Blood pressure in the emergency room on arrival was 230/133. He received IV BP meds along with nitroglycerin patch. Blood pressure still is 200/112. Will start on Nicardipine drip and placed patient in the ICU.  Girlfriend is present in the ER  PAST MEDICAL HISTORY:   Past Medical History:  Diagnosis Date  . CAD (coronary artery disease)    status post multiple myocardial infarctions  . Chronic kidney disease   . COPD (chronic obstructive pulmonary disease) ([place])   . Hyperlipidemia   . Hypertension   . Myocardial infarction ([place])    " I had 13 heart attacks, 8 massive"  . Proteinuria   . Renal insufficiency   . Secondary hyperparathyroidism of renal origin ([place])   . Shortness of breath dyspnea    " when I drink a cup of cold water too fast"  . Sleep apnea    pt stated " long story" does not wear CPAP  . Stroke [facility])     PAST SURGICAL  HISTOIRY:   Past Surgical History:  Procedure Laterality Date  . AV FISTULA PLACEMENT Left 01-24-14   By Dr. Hortencia Pilar in [place]  . AV FISTULA PLACEMENT Right 04/30/2014   Procedure: RIGHT BRACHIOCEPHALIC ARTERIOVENOUS (AV) FISTULA CREATION;  Surgeon: Elam Dutch, MD;  Location: [facility] OR;  Service: Vascular;  Laterality: Right;  . AV FISTULA PLACEMENT Left 05/31/2014   Procedure: Left INSERTION OF ARTERIOVENOUS (AV) GORE-TEX GRAFT ARM;  Surgeon: Elam Dutch, MD;  Location: [facility];  Service: Vascular;  Laterality: Left;  . CARDIAC CATHETERIZATION    . CORONARY STENT PLACEMENT     13 stents total  . DIALYSIS/PERMA CATHETER INSERTION N/A 01/15/2017   Procedure: DIALYSIS/PERMA CATHETER INSERTION;  Surgeon: Katha Cabal, MD;  Location: [facility] CV LAB;  Service: Cardiovascular;  Laterality: N/A;    SOCIAL HISTORY:   Social History   Tobacco Use  . Smoking status: Current Every Day Smoker    Packs/day: 0.25    Years: 25.00    Pack years: 6.25    Types: Cigarettes  . Smokeless tobacco: Never Used  Substance Use Topics  . Alcohol use: No    Alcohol/week: 0.0 standard drinks    Frequency: Never    Comment: occasional    FAMILY HISTORY:   Family History  Problem Relation Age of Onset  . Heart disease Mother   . Hyperlipidemia Mother   .  Hypertension Mother   . Kidney disease Father   . Kidney disease Brother   . Hyperlipidemia Sister   . Hyperlipidemia Daughter   . Hypertension Sister   . Hypertension Daughter     DRUG ALLERGIES:   Allergies  Allergen Reactions  . Ibuprofen Other (See Comments)    Kidney   . Viagra [Sildenafil]     Lips swell   . Clonidine Derivatives Rash    REVIEW OF SYSTEMS:  Review of Systems  Constitutional: Negative for chills, fever and weight loss.  HENT: Negative for ear discharge, ear pain and nosebleeds.   Eyes: Negative for blurred vision, pain and discharge.  Respiratory: Positive for shortness of breath.  Negative for sputum production, wheezing and stridor.   Cardiovascular: Negative for chest pain, palpitations, orthopnea and PND.  Gastrointestinal: Negative for abdominal pain, diarrhea, nausea and vomiting.  Genitourinary: Negative for frequency and urgency.  Musculoskeletal: Negative for back pain and joint pain.  Neurological: Negative for sensory change, speech change, focal weakness and weakness.  Psychiatric/Behavioral: Negative for depression and hallucinations. The patient is not nervous/anxious.      MEDICATIONS AT HOME:   Prior to Admission medications   Medication Sig Start Date End Date Taking? Authorizing Provider  amLODipine (NORVASC) 10 MG tablet Take 1 tablet (10 mg total) by mouth daily. 05/01/17 05/01/18 Yes Lisa Roca, MD  atorvastatin (LIPITOR) 40 MG tablet Take 1 tablet (40 mg total) by mouth daily. 11/03/16  Yes Demetrios Loll, MD  calcium acetate (PHOSLO) 667 MG capsule Take 1,334 mg by mouth 3 (three) times daily with meals. 12/03/17 12/03/18 Yes [provider]  carvedilol (COREG) 25 MG tablet Take 1 tablet (25 mg total) by mouth 2 (two) times daily. 05/01/17 05/01/18 Yes Lisa Roca, MD  clopidogrel (PLAVIX) 75 MG tablet Take 1 tablet (75 mg total) by mouth daily. 11/03/16  Yes Demetrios Loll, MD  hydrALAZINE (APRESOLINE) 100 MG tablet Take 1 tablet (100 mg total) by mouth 4 (four) times daily. Patient taking differently: Take 100 mg by mouth every 8 (eight) hours.  05/01/17 05/01/18 Yes Lisa Roca, MD  isosorbide dinitrate (ISORDIL) 30 MG tablet Take 30 mg by mouth 3 (three) times daily.    Yes [provider]  nitroGLYCERIN (NITROSTAT) 0.4 MG SL tablet Place 0.4 mg under the tongue every 5 (five) minutes as needed for chest pain.   Yes [provider]  aspirin EC 81 MG EC tablet Take 1 tablet (81 mg total) by mouth daily. Patient not taking: Reported on 03/25/2018 11/03/16   Demetrios Loll, MD  fluticasone [facility]) 50 MCG/ACT nasal spray INSTILL 1 SPRAY PER  NARES DAILY 01/14/16   [provider]  Multiple Vitamin (MULTIVITAMIN) tablet Take 1 tablet daily by mouth.    [provider]      VITAL SIGNS:  Blood pressure (!) 200/112, pulse 88, temperature (!) 97.5 F (36.4 C), temperature source Oral, resp. rate (!) 26, height 6' (1.829 m), weight 93.5 kg, SpO2 97 %.  PHYSICAL EXAMINATION:  GENERAL:  48 y.o.-year-old patient lying in the bed with no acute distress. Appears chronically ill EYES: Pupils equal, round, reactive to light and accommodation. No scleral icterus. Extraocular muscles intact.  HEENT: Head atraumatic, normocephalic. Oropharynx and nasopharynx clear.  NECK:  Supple, no jugular venous distention. No thyroid enlargement, no tenderness.  LUNGS: Normal breath sounds bilaterally, no wheezing, rales,rhonchi or crepitation. No use of accessory muscles of respiration.  CARDIOVASCULAR: S1, S2 normal. No murmurs, rubs, or gallops.  ABDOMEN: Soft, nontender, nondistended. Bowel sounds present. No organomegaly or mass.  EXTREMITIES: No pedal edema, cyanosis, or clubbing.  NEUROLOGIC: Cranial nerves II through XII are intact. Muscle strength 5/5 in all extremities. Sensation intact. Gait not checked.  PSYCHIATRIC: The patient is alert and oriented x 3.  SKIN: No obvious rash, lesion, or ulcer.   LABORATORY PANEL:   CBC Recent Labs  Lab 03/25/18 0922  WBC 7.9  HGB 10.7*  HCT 32.5*  PLT 201   ------------------------------------------------------------------------------------------------------------------  Chemistries  No results for input(s): NA, K, CL, CO2, GLUCOSE, BUN, CREATININE, CALCIUM, MG, AST, ALT, ALKPHOS, BILITOT in the last 168 hours.  Invalid input(s): GFRCGP ------------------------------------------------------------------------------------------------------------------  Cardiac Enzymes No results for input(s): TROPONINI in the last 168  hours. ------------------------------------------------------------------------------------------------------------------  RADIOLOGY:  Dg Chest Port 1 View  Result Date: 03/25/2018 CLINICAL DATA:  Shortness of breath.  Renal failure. EXAM: PORTABLE CHEST 1 VIEW COMPARISON:  March 21, 2017 FINDINGS: There is cardiomegaly with mild pulmonary venous hypertension. No edema or consolidation. Central catheter tip is in the right atrium slightly beyond the cavoatrial junction. No pneumothorax. No bone lesions. IMPRESSION: Cardiomegaly with a degree of pulmonary vascular congestion. No edema or consolidation. Central catheter tip in right atrium. Electronically Signed   By: Lowella Grip III M.D.   On: 03/25/2018 08:09    EKG:  normal sinus rhythm, LVH, left atrial enlargement.  IMPRESSION AND PLAN:   Davan Nawabi  is a 48 y.o. male with a known history of hypertension, end-stage renal disease on hemodialysis, COPD, hyperlipidemia, noncompliance to dialysis .  1. Hypertensive urgency suspected medical noncompliance -admit to ICU -IV Nicardipine drip -continue all home meds  2. Hyperkalemia -potassium of 6.3 -patient treated with calcium gluconate, insulin and dextrose -he will get urgent dialysis once staff is available at 11 AM according to Dr. Juleen China -nephrology consultation placed  3. End-stage renal disease on hemodialysis -patient has been fired from all the dialysis centers in the area due to recurrent threats being given to the staff. I have discussed with patient once he is hemodynamically stable he will be discharged and patient will figure out where he needs to go for dialysis since he has burned all his bridges. Patient is girlfriend was present in the room during my conversation  4. DVT prophylaxis subcu heparin  All the records are reviewed and case discussed with ED provider.   CODE STATUS: FULL  TOTAL critical TIME TAKING CARE OF THIS PATIENT: *50* minutes.     Fritzi Mandes M.D on 03/25/2018 at 10:35 AM  Between 7am to 6pm - Pager - [phone]  After 6pm go to [email or web address] - password [email or web address] [place] [facility]  Office  [phone]  CC: Primary care physician; Patient, No Pcp Per

## 2018-03-25 NOTE — Progress Notes (Signed)
Pt cramping , uf removed 2900 mls, tx ended 2 minutes earlier    03/25/18 1930  Vital Signs  BP Location Right Arm  BP Method Automatic  Patient Position (if appropriate) Lying  During Hemodialysis Assessment  Blood Flow Rate (mL/min) 400 mL/min  Arterial Pressure (mmHg) -140 mmHg  Venous Pressure (mmHg) 130 mmHg  Transmembrane Pressure (mmHg) 80 mmHg  Ultrafiltration Rate (mL/min) 850 mL/min  Dialysate Flow Rate (mL/min) 800 ml/min  Conductivity: Machine  13.9  HD Safety Checks Performed Yes  Intra-Hemodialysis Comments See progress note

## 2018-03-25 NOTE — ED Notes (Signed)
Attempted IV access x 1, pt states "i'm gonna get real pissed off if you have to keep sticking me and fishing around"  IV consult to be placed.

## 2018-03-25 NOTE — Progress Notes (Signed)
Pt watching TV    03/25/18 1830  During Hemodialysis Assessment  Blood Flow Rate (mL/min) 400 mL/min  Arterial Pressure (mmHg) -140 mmHg  Venous Pressure (mmHg) 110 mmHg  Transmembrane Pressure (mmHg) 70 mmHg  Ultrafiltration Rate (mL/min) 860 mL/min  Dialysate Flow Rate (mL/min) 800 ml/min  Conductivity: Machine  140  HD Safety Checks Performed Yes  Intra-Hemodialysis Comments Progressing as prescribed (2036 mls)

## 2018-03-25 NOTE — Progress Notes (Signed)
Pt watching TV, denies any c/o.   03/25/18 1855  Vital Signs  BP Location Right Arm  BP Method Automatic  Patient Position (if appropriate) Lying  During Hemodialysis Assessment  Blood Flow Rate (mL/min) 400 mL/min  Arterial Pressure (mmHg) -140 mmHg  Venous Pressure (mmHg) 120 mmHg  Transmembrane Pressure (mmHg) 80 mmHg  Ultrafiltration Rate (mL/min) 860 mL/min  Dialysate Flow Rate (mL/min) 800 ml/min  Conductivity: Machine  14.1  HD Safety Checks Performed Yes  Intra-Hemodialysis Comments Progressing as prescribed

## 2018-03-25 NOTE — Progress Notes (Signed)
Pt watching TV    03/25/18 1745  During Hemodialysis Assessment  Blood Flow Rate (mL/min) 400 mL/min  Arterial Pressure (mmHg) -140 mmHg  Venous Pressure (mmHg) 110 mmHg  Transmembrane Pressure (mmHg) 80 mmHg  Ultrafiltration Rate (mL/min) 860 mL/min  Dialysate Flow Rate (mL/min) 800 ml/min  Conductivity: Machine  140  HD Safety Checks Performed Yes  Intra-Hemodialysis Comments Progressing as prescribed

## 2018-03-25 NOTE — Progress Notes (Signed)
Post dialysis assessment.   03/25/18 1938  Neurological  Level of Consciousness Alert  Orientation Level Oriented X4  Respiratory  Respiratory Pattern Regular;Unlabored  Chest Assessment Chest expansion symmetrical  Bilateral Breath Sounds Clear;Diminished  R Upper  Breath Sounds Clear  L Upper Breath Sounds Clear  R Lower Breath Sounds Diminished  L Lower Breath Sounds Diminished  Cough None  Cardiac  Pulse Regular  Vascular  R Radial Pulse +2  L Radial Pulse +2  R Dorsalis Pedis Pulse +1  L Dorsalis Pedis Pulse +1  Psychosocial  Psychosocial (WDL) X  Patient Behaviors Appropriate for situation  Needs Expressed Emotional;Physical  Emotional support given Given to patient

## 2018-03-26 LAB — BASIC METABOLIC PANEL
Anion gap: 11 (ref 5–15)
BUN: 82 mg/dL — ABNORMAL HIGH (ref 6–20)
CO2: 24 mmol/L (ref 22–32)
Calcium: 7.7 mg/dL — ABNORMAL LOW (ref 8.9–10.3)
Chloride: 106 mmol/L (ref 98–111)
Creatinine, Ser: 19.04 mg/dL — ABNORMAL HIGH (ref 0.61–1.24)
GFR calc Af Amer: 3 mL/min — ABNORMAL LOW (ref >60)
GFR calc non Af Amer: 3 mL/min — ABNORMAL LOW (ref >60)
GLUCOSE: 119 mg/dL — AB (ref 70–99)
Potassium: 4.2 mmol/L (ref 3.5–5.1)
Sodium: 141 mmol/L (ref 135–145)

## 2018-03-26 MED ORDER — POTASSIUM CHLORIDE 20 MEQ PO PACK: 40.0000 meq | PACK | Freq: Once | ORAL | Status: DC

## 2018-03-26 NOTE — Progress Notes (Signed)
Lab Tech notified this RN that patient is refusing lab work this morning. Patient educated by this RN concerning reason for repeated lab work. Patient continues to refuse to have blood drawn.

## 2018-03-26 NOTE — Plan of Care (Signed)
Patient weaned off cardene drip this morning.  BP WNL.  No complaints of pain or respiratory distress.  HD completed 03/25/2018 and lab values improved from admission.  Patient educated on importance of medical regimen to prevent complications.    Problem: Education: Goal: Knowledge of General Education information will improve Description Including pain rating scale, medication(s)/side effects and non-pharmacologic comfort measures Outcome: Completed/Met   Problem: Health Behavior/Discharge Planning: Goal: Ability to manage health-related needs will improve Outcome: Completed/Met   Problem: Clinical Measurements: Goal: Ability to maintain clinical measurements within normal limits will improve Outcome: Completed/Met Goal: Will remain free from infection Outcome: Completed/Met Goal: Diagnostic test results will improve Outcome: Completed/Met Goal: Respiratory complications will improve Outcome: Completed/Met Goal: Cardiovascular complication will be avoided Outcome: Completed/Met   Problem: Activity: Goal: Risk for activity intolerance will decrease Outcome: Completed/Met   Problem: Nutrition: Goal: Adequate nutrition will be maintained Outcome: Completed/Met   Problem: Coping: Goal: Level of anxiety will decrease Outcome: Completed/Met   Problem: Elimination: Goal: Will not experience complications related to bowel motility Outcome: Completed/Met Goal: Will not experience complications related to urinary retention Outcome: Completed/Met   Problem: Pain Managment: Goal: General experience of comfort will improve Outcome: Completed/Met   Problem: Safety: Goal: Ability to remain free from injury will improve Outcome: Completed/Met   Problem: Skin Integrity: Goal: Risk for impaired skin integrity will decrease Outcome: Completed/Met

## 2018-03-26 NOTE — Discharge Summary (Signed)
[place] [facility] [place] NAME: Fernando Salas    MR#:  [id number]  DATE OF BIRTH:  1970/09/08  DATE OF ADMISSION:  03/25/2018   ADMITTING PHYSICIAN: Fritzi Mandes, MD  DATE OF DISCHARGE: 03/26/2018 12:25 PM  PRIMARY CARE PHYSICIAN: Patient, No Pcp Per   ADMISSION DIAGNOSIS:  Hyperkalemia [E87.5] Hypoxia [R09.02] Elevated troponin [R79.89] Hypertensive urgency [I16.0] DISCHARGE DIAGNOSIS:  Active Problems:   Hypertensive urgency  SECONDARY DIAGNOSIS:   Past Medical History:  Diagnosis Date  . CAD (coronary artery disease)    status post multiple myocardial infarctions  . Chronic kidney disease   . COPD (chronic obstructive pulmonary disease) ([place])   . Hyperlipidemia   . Hypertension   . Myocardial infarction ([place])    " I had 13 heart attacks, 8 massive"  . Proteinuria   . Renal insufficiency   . Secondary hyperparathyroidism of renal origin ([place])   . Shortness of breath dyspnea    " when I drink a cup of cold water too fast"  . Sleep apnea    pt stated " long story" does not wear CPAP  . Stroke [facility])    HOSPITAL COURSE:   Fernando Salas is a 48 year old male who presented to the ED for dialysis.  In the ED, he was noted to be hypertensive with blood pressures to 230/133.  He was started on a nicardipine drip and was admitted to the ICU.  He received dialysis while hospitalized, and tolerated this well. His blood pressures improved after dialysis.  He was discharged home. DISCHARGE CONDITIONS:  Hypertension ESRD on HD COPD Hyperlipidemia CONSULTS OBTAINED:  Treatment Team:  Lavonia Dana, MD DRUG ALLERGIES:   Allergies  Allergen Reactions  . Ibuprofen Other (See Comments)    Kidney   . Viagra [Sildenafil]     Lips swell   . Clonidine Derivatives Rash   DISCHARGE MEDICATIONS:   Allergies as of 03/26/2018      Reactions   Ibuprofen Other (See Comments)   Kidney    Viagra [sildenafil]    Lips swell    Clonidine Derivatives  Rash      Medication List    TAKE these medications   amLODipine 10 MG tablet Commonly known as:  NORVASC Take 1 tablet (10 mg total) by mouth daily.   aspirin 81 MG EC tablet Take 1 tablet (81 mg total) by mouth daily.   atorvastatin 40 MG tablet Commonly known as:  LIPITOR Take 1 tablet (40 mg total) by mouth daily.   calcium acetate 667 MG capsule Commonly known as:  PHOSLO Take 1,334 mg by mouth 3 (three) times daily with meals.   carvedilol 25 MG tablet Commonly known as:  COREG Take 1 tablet (25 mg total) by mouth 2 (two) times daily.   clopidogrel 75 MG tablet Commonly known as:  PLAVIX Take 1 tablet (75 mg total) by mouth daily.   fluticasone 50 MCG/ACT nasal spray Commonly known as:  FLONASE INSTILL 1 SPRAY PER NARES DAILY   hydrALAZINE 100 MG tablet Commonly known as:  APRESOLINE Take 1 tablet (100 mg total) by mouth 4 (four) times daily. What changed:  when to take this   isosorbide dinitrate 30 MG tablet Commonly known as:  ISORDIL Take 30 mg by mouth 3 (three) times daily.   multivitamin tablet Take 1 tablet daily by mouth.   nitroGLYCERIN 0.4 MG SL tablet Commonly known as:  NITROSTAT Place 0.4 mg under the tongue every 5 (five)  minutes as needed for chest pain.        DISCHARGE INSTRUCTIONS:  1.  Follow-up with PCP in 5 days DIET:  Renal diet DISCHARGE CONDITION:  Stable ACTIVITY:  Activity as tolerated OXYGEN:  Home Oxygen: No.  Oxygen Delivery: room air DISCHARGE LOCATION:  home   If you experience worsening of your admission symptoms, develop shortness of breath, life threatening emergency, suicidal or homicidal thoughts you must seek medical attention immediately by calling 911 or calling your MD immediately  if symptoms less severe.  You Must read complete instructions/literature along with all the possible adverse reactions/side effects for all the Medicines you take and that have been prescribed to you. Take any new Medicines  after you have completely understood and accpet all the possible adverse reactions/side effects.   Please note  You were cared for by a hospitalist during your hospital stay. If you have any questions about your discharge medications or the care you received while you were in the hospital after you are discharged, you can call the unit and asked to speak with the hospitalist on call if the hospitalist that took care of you is not available. Once you are discharged, your primary care physician will handle any further medical issues. Please note that NO REFILLS for any discharge medications will be authorized once you are discharged, as it is imperative that you return to your primary care physician (or establish a relationship with a primary care physician if you do not have one) for your aftercare needs so that they can reassess your need for medications and monitor your lab values.    On the day of Discharge:  VITAL SIGNS:  Blood pressure 130/71, pulse 66, temperature 98 F (36.7 C), resp. rate (!) 24, height 6' (1.829 m), weight 98.9 kg, SpO2 93 %. PHYSICAL EXAMINATION:  GENERAL:  48 y.o.-year-old patient lying in the bed with no acute distress.  Chronically ill-appearing. EYES: Pupils equal, round, reactive to light and accommodation. No scleral icterus. Extraocular muscles intact.  HEENT: Head atraumatic, normocephalic. Oropharynx and nasopharynx clear.  NECK:  Supple, no jugular venous distention. No thyroid enlargement, no tenderness.  LUNGS: Normal breath sounds bilaterally, no wheezing, rales,rhonchi or crepitation. No use of accessory muscles of respiration.  CARDIOVASCULAR: S1, S2 normal. No murmurs, rubs, or gallops.  ABDOMEN: Soft, non-tender, non-distended. Bowel sounds present. No organomegaly or mass.  EXTREMITIES: No pedal edema, cyanosis, or clubbing.  NEUROLOGIC: Cranial nerves II through XII are intact. Muscle strength 5/5 in all extremities. Sensation intact. Gait not  checked.  PSYCHIATRIC: The patient is alert and oriented x 3.  SKIN: No obvious rash, lesion, or ulcer.  DATA REVIEW:   CBC Recent Labs  Lab 03/25/18 0922  WBC 7.9  HGB 10.7*  HCT 32.5*  PLT 201    Chemistries  Recent Labs  Lab 03/25/18 0922 03/25/18 2049 03/26/18 0659  NA 140 136 141  K 6.3* 3.2* 4.2  CL 108 101 106  CO2 13* 22 24  GLUCOSE 93 111* 119*  BUN 156* 74* 82*  CREATININE 29.88* 15.55* 19.04*  CALCIUM 8.7* 8.4* 7.7*  MG  --  2.0  --   AST 21  --   --   ALT 20  --   --   ALKPHOS 70  --   --   BILITOT 0.9  --   --      Microbiology Results  Results for orders placed or performed during the hospital encounter of 03/25/18  MRSA  PCR Screening     Status: None   Collection Time: 03/25/18 12:05 PM  Result Value Ref Range Status   MRSA by PCR NEGATIVE NEGATIVE Final    Comment:        The GeneXpert MRSA Assay (FDA approved for NASAL specimens only), is one component of a comprehensive MRSA colonization surveillance program. It is not intended to diagnose MRSA infection nor to guide or monitor treatment for MRSA infections. Performed at [facility], [address], [place], [place] [address]     RADIOLOGY:  No results found.   Management plans discussed with the patient, family and they are in agreement.  CODE STATUS: Full Code   TOTAL TIME TAKING CARE OF THIS PATIENT: 35 minutes.    Berna Spare  M.D on 03/26/2018 at 2:21 PM  Between 7am to 6pm - Pager [phone]  After 6pm go to [email or web address] - Proofreader  [facility]  Office  [phone]  CC: Primary care physician; Patient, No Pcp Per   Note: This dictation was prepared with Dragon dictation along with smaller phrase technology. Any transcriptional errors that result from this process are unintentional.

## 2018-03-26 NOTE — Progress Notes (Signed)
Discharge instructions reviewed with patient and his girlfriend.  PIV x1 removed and patient's clothing returned to him.  Patient taken to visitor's entrance via wheelchair for discharge home.

## 2018-03-26 NOTE — Progress Notes (Addendum)
[facility]  ROUNDING NOTE   Subjective:   Hemodialysis treatment yesterday. Tolerated treatment well. UF of 2.9 liters.   Blood pressure better controlled. Nicardipine gtt.   permcath functioned well.   Upset last night.   Objective:  Vital signs in last 24 hours:  Temp:  [97.7 F (36.5 C)-98.3 F (36.8 C)] 98 F (36.7 C) (01/25 0730) Pulse Rate:  [67-91] 72 (01/25 0700) Resp:  [13-35] 16 (01/25 0700) BP: (124-219)/(51-132) 144/79 (01/25 0700) SpO2:  [89 %-98 %] 95 % (01/25 0700) Weight:  [98.9 kg] 98.9 kg (01/24 1155)  Weight change:  Filed Weights   03/25/18 0752 03/25/18 1155  Weight: 93.5 kg 98.9 kg    Intake/Output: I/O last 3 completed shifts: In: 1546.9 [P.O.:240; I.V.:1256.9; IV Piggyback:50] Out: 2900 [Other:2900]   Intake/Output this shift:  No intake/output data recorded.  Physical Exam: General: NAD,   Head: Normocephalic, atraumatic. Moist oral mucosal membranes  Eyes: Anicteric, PERRL  Neck: Supple, trachea midline  Lungs:  Clear to auscultation  Heart: Regular rate and rhythm  Abdomen:  Soft, nontender,   Extremities:  no peripheral edema.  Neurologic: Nonfocal, moving all four extremities  Skin: No lesions  Access: RIJ    Basic Metabolic Panel: Recent Labs  Lab 03/25/18 0922 03/25/18 2049 03/26/18 0659  NA 140 136 141  K 6.3* 3.2* 4.2  CL 108 101 106  CO2 13* 22 24  GLUCOSE 93 111* 119*  BUN 156* 74* 82*  CREATININE 29.88* 15.55* 19.04*  CALCIUM 8.7* 8.4* 7.7*  MG  --  2.0  --   PHOS  --  3.9  --     Liver Function Tests: Recent Labs  Lab 03/25/18 0922  AST 21  ALT 20  ALKPHOS 70  BILITOT 0.9  PROT 8.0  ALBUMIN 4.3   No results for input(s): LIPASE, AMYLASE in the last 168 hours. No results for input(s): AMMONIA in the last 168 hours.  CBC: Recent Labs  Lab 03/25/18 0922  WBC 7.9  HGB 10.7*  HCT 32.5*  MCV 93.7  PLT 201    Cardiac Enzymes: Recent Labs  Lab 03/25/18 0922 03/25/18 2049   TROPONINI 0.12* 0.12*    BNP: Invalid input(s): POCBNP  CBG: Recent Labs  Lab 03/25/18 1152  GLUCAP 94    Microbiology: Results for orders placed or performed during the hospital encounter of 03/25/18  MRSA PCR Screening     Status: None   Collection Time: 03/25/18 12:05 PM  Result Value Ref Range Status   MRSA by PCR NEGATIVE NEGATIVE Final    Comment:        The GeneXpert MRSA Assay (FDA approved for NASAL specimens only), is one component of a comprehensive MRSA colonization surveillance program. It is not intended to diagnose MRSA infection nor to guide or monitor treatment for MRSA infections. Performed at [facility], [address] [place], [place] [address]     Coagulation Studies: No results for input(s): LABPROT, INR in the last 72 hours.  Urinalysis: No results for input(s): COLORURINE, LABSPEC, PHURINE, GLUCOSEU, HGBUR, BILIRUBINUR, KETONESUR, PROTEINUR, UROBILINOGEN, NITRITE, LEUKOCYTESUR in the last 72 hours.  Invalid input(s): APPERANCEUR    Imaging: Dg Chest Port 1 View  Result Date: 03/25/2018 CLINICAL DATA:  Shortness of breath.  Renal failure. EXAM: PORTABLE CHEST 1 VIEW COMPARISON:  March 21, 2017 FINDINGS: There is cardiomegaly with mild pulmonary venous hypertension. No edema or consolidation. Central catheter tip is in the right atrium slightly beyond the cavoatrial  junction. No pneumothorax. No bone lesions. IMPRESSION: Cardiomegaly with a degree of pulmonary vascular congestion. No edema or consolidation. Central catheter tip in right atrium. Electronically Signed   By: Lowella Grip III M.D.   On: 03/25/2018 08:09     Medications:   . niCARDipine 2.5 mg/hr (03/26/18 0700)   . amLODipine  10 mg Oral Daily  . aspirin EC  81 mg Oral Daily  . atorvastatin  40 mg Oral Daily  . calcium acetate  1,334 mg Oral TID WC  . carvedilol  25 mg Oral BID  . Chlorhexidine Gluconate Cloth  6 each Topical Q0600  . clopidogrel  75  mg Oral Daily  . heparin  5,000 Units Subcutaneous Q8H  . hydrALAZINE  100 mg Oral Q8H  . isosorbide dinitrate  30 mg Oral TID  . multivitamin with minerals  1 tablet Oral Daily   acetaminophen **OR** acetaminophen, labetalol, nitroGLYCERIN, ondansetron **OR** ondansetron (ZOFRAN) IV  Assessment/ Plan:  [name] is a 48 y.o. black male with end stage renal disease on hemodialysis, hypertension, CVA, sleep apnea, coronary artery disease, COPD, anti-social personality disorder.   1. End Stage Renal Disease: currently without an outpatient unit. Tolerated hemodialysis treatment yesterday.  No indication for dialysis at this time. Next scheduled treatment for Monday.  RIJ permcath  2. Hypertension: noncompliant with home regimen - nicardipine gtt - Home regimen restarted: amlodipine, carvedilol, hydralazine, isosorbide mononitrate.  - PRN IV labetalol - Consider minoxidil if bp needs more controlled.   3. Anemia of chronic kidney disease: hemoglobin 10.7. No indication for ESA.   4. Secondary Hyperparathyroidism: elevated on 02/28/18 at 737 - calcium acetate .    LOS: 1 [name] 1/25/20208:27 AM

## 2018-03-27 IMAGING — DX DG CHEST 1V PORT
1 series · 2 of 2 positions shown · non-contrast
Comparison: March 08, 2017

CLINICAL DATA: Shortness of breath.  Chronic renal failure

EXAM:
PORTABLE CHEST 1 VIEW

[Series 1: chest ap · 0.14mm/px · 2 of 2 slices shown]
[im 1/2]
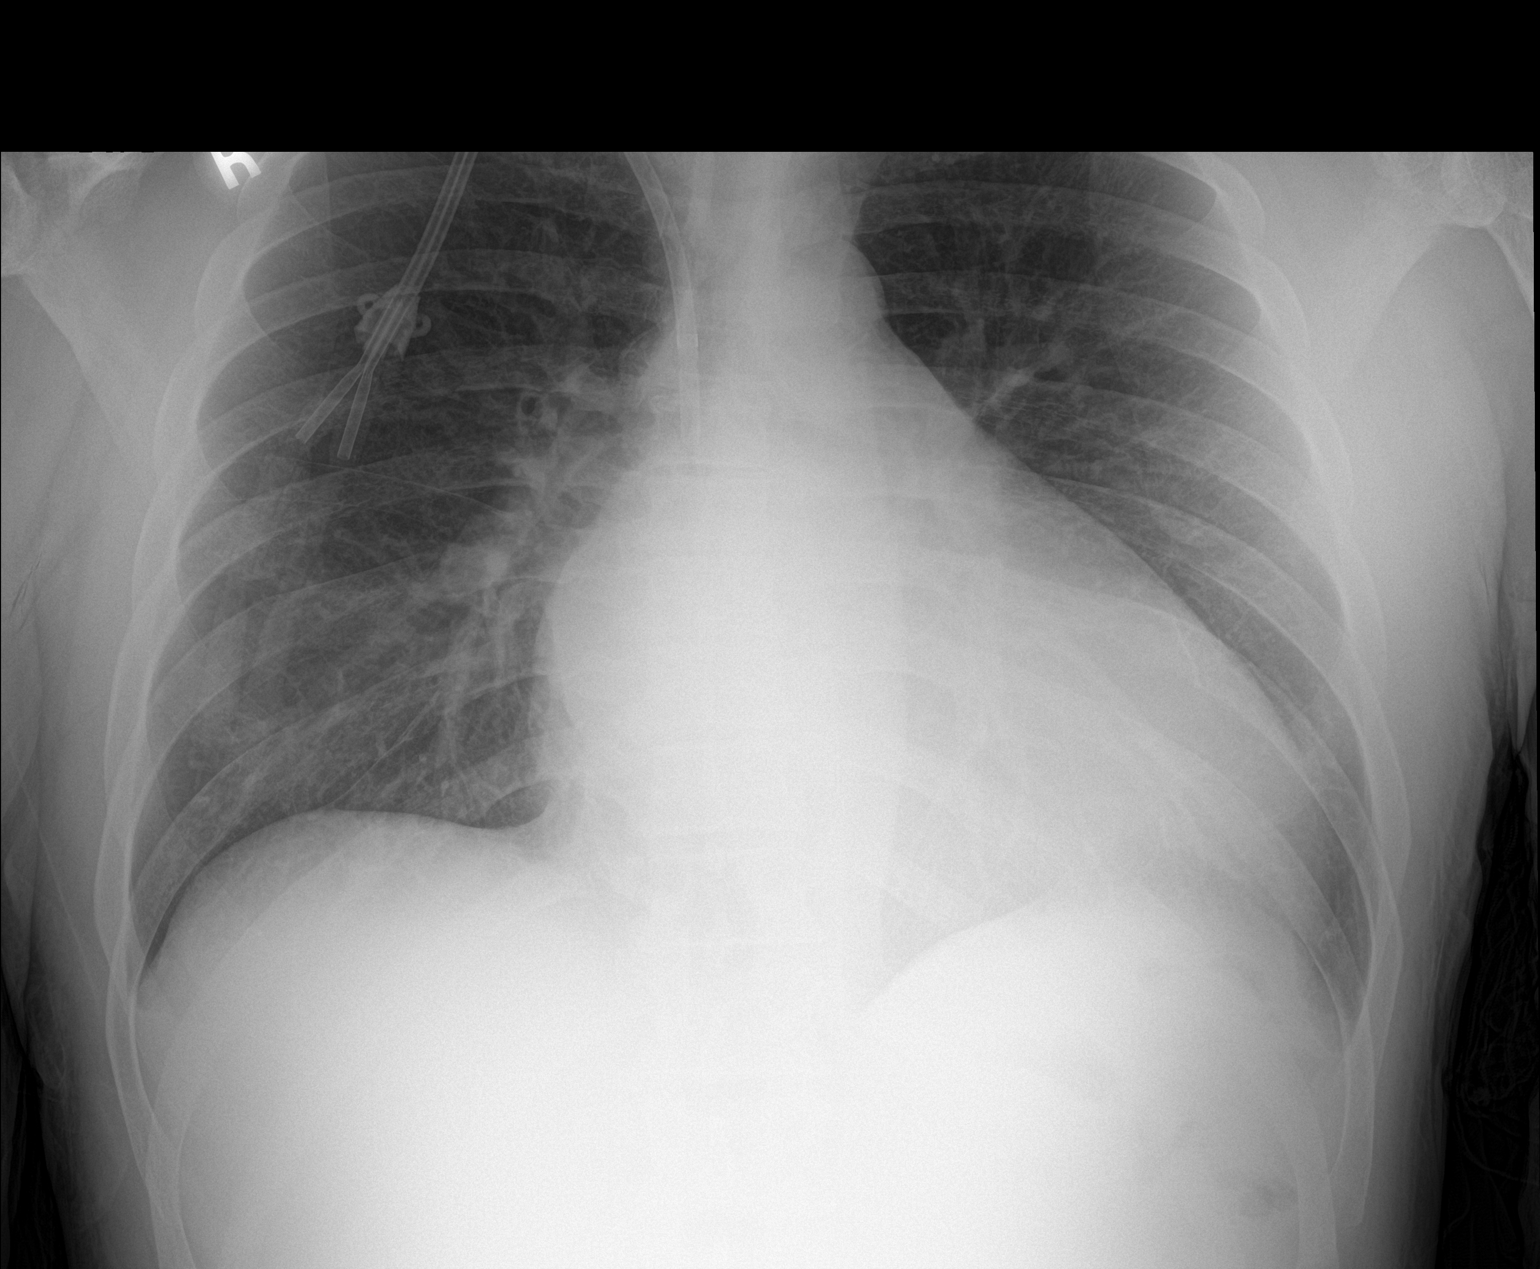
[im 2/2]
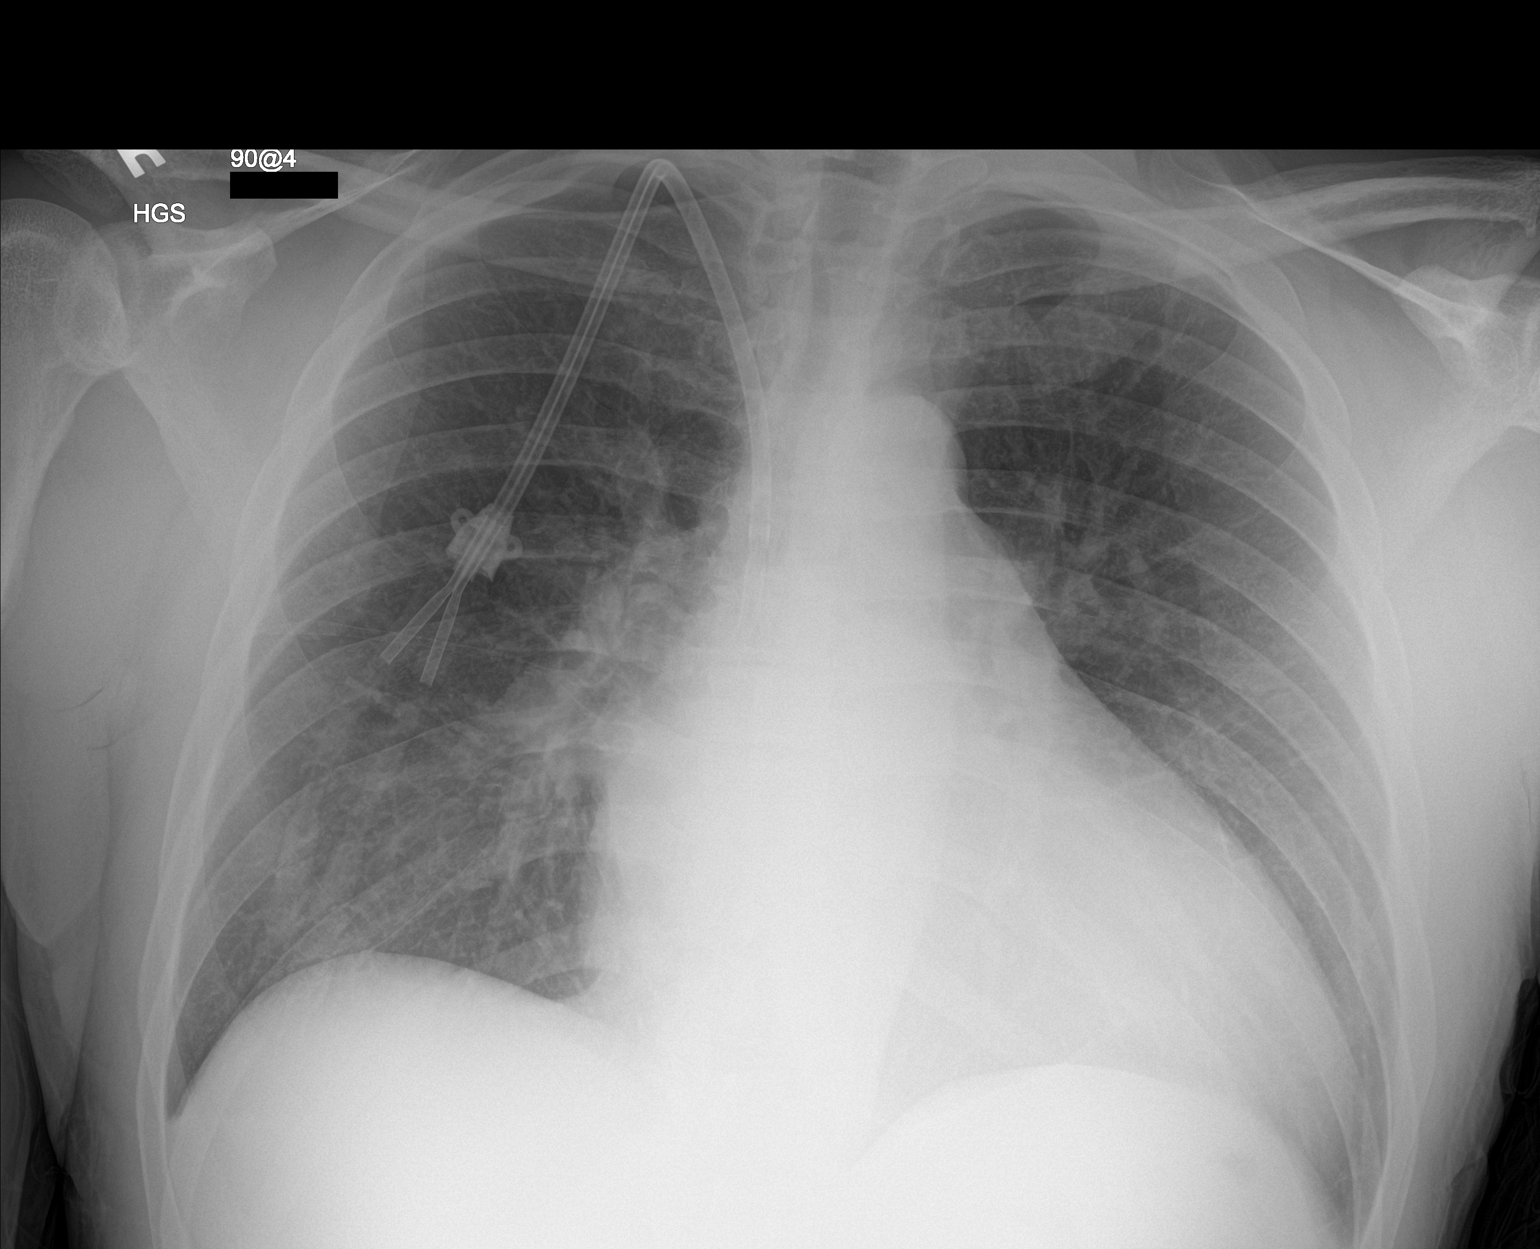

[2 of 2 positions shown; findings below may reference images not displayed]

FINDINGS: Central catheter tip is in the superior vena cava. No pneumothorax.
There is no edema or consolidation. There is cardiac enlargement
with questionable underlying pericardial effusion. Pulmonary
vascularity is normal. No adenopathy. No bone lesions.
IMPRESSION: Cardiomegaly with questionable underlying pericardial effusion. No
edema or consolidation. Central catheter tip in superior vena cava.
No pneumothorax.

## 2018-04-08 ENCOUNTER — Emergency Department: Payer: Medicare Other

## 2018-04-08 ENCOUNTER — Encounter: Payer: Self-pay | Admitting: Emergency Medicine

## 2018-04-08 ENCOUNTER — Other Ambulatory Visit: Payer: Self-pay

## 2018-04-08 ENCOUNTER — Inpatient Hospital Stay
Admission: EM | Admit: 2018-04-08 | Discharge: 2018-04-10 | DRG: 682 | Disposition: A | Discharge status: Home / Self Care | Payer: Medicare Other | Attending: Specialist | Admitting: Specialist

## 2018-04-08 DIAGNOSIS — Z841 Family history of disorders of kidney and ureter: Secondary | ICD-10-CM | POA: Diagnosis not present

## 2018-04-08 DIAGNOSIS — Z888 Allergy status to other drugs, medicaments and biological substances status: Secondary | ICD-10-CM | POA: Diagnosis not present

## 2018-04-08 DIAGNOSIS — Z7982 Long term (current) use of aspirin: Secondary | ICD-10-CM

## 2018-04-08 DIAGNOSIS — Z79899 Other long term (current) drug therapy: Secondary | ICD-10-CM | POA: Diagnosis not present

## 2018-04-08 DIAGNOSIS — E785 Hyperlipidemia, unspecified: Secondary | ICD-10-CM | POA: Diagnosis present

## 2018-04-08 DIAGNOSIS — I120 Hypertensive chronic kidney disease with stage 5 chronic kidney disease or end stage renal disease: Secondary | ICD-10-CM | POA: Diagnosis present

## 2018-04-08 DIAGNOSIS — I252 Old myocardial infarction: Secondary | ICD-10-CM

## 2018-04-08 DIAGNOSIS — G4730 Sleep apnea, unspecified: Secondary | ICD-10-CM | POA: Diagnosis present

## 2018-04-08 DIAGNOSIS — N186 End stage renal disease: Secondary | ICD-10-CM | POA: Diagnosis present

## 2018-04-08 DIAGNOSIS — J9691 Respiratory failure, unspecified with hypoxia: Secondary | ICD-10-CM | POA: Diagnosis present

## 2018-04-08 DIAGNOSIS — R0600 Dyspnea, unspecified: Secondary | ICD-10-CM

## 2018-04-08 DIAGNOSIS — Z8249 Family history of ischemic heart disease and other diseases of the circulatory system: Secondary | ICD-10-CM | POA: Diagnosis not present

## 2018-04-08 DIAGNOSIS — Z7901 Long term (current) use of anticoagulants: Secondary | ICD-10-CM | POA: Diagnosis not present

## 2018-04-08 DIAGNOSIS — Z8673 Personal history of transient ischemic attack (TIA), and cerebral infarction without residual deficits: Secondary | ICD-10-CM | POA: Diagnosis not present

## 2018-04-08 DIAGNOSIS — E8770 Fluid overload, unspecified: Secondary | ICD-10-CM

## 2018-04-08 DIAGNOSIS — Z9115 Patient's noncompliance with renal dialysis: Secondary | ICD-10-CM

## 2018-04-08 DIAGNOSIS — Z955 Presence of coronary angioplasty implant and graft: Secondary | ICD-10-CM | POA: Diagnosis not present

## 2018-04-08 DIAGNOSIS — I2510 Atherosclerotic heart disease of native coronary artery without angina pectoris: Secondary | ICD-10-CM | POA: Diagnosis present

## 2018-04-08 DIAGNOSIS — F17210 Nicotine dependence, cigarettes, uncomplicated: Secondary | ICD-10-CM | POA: Diagnosis present

## 2018-04-08 DIAGNOSIS — Z992 Dependence on renal dialysis: Secondary | ICD-10-CM

## 2018-04-08 DIAGNOSIS — N2581 Secondary hyperparathyroidism of renal origin: Secondary | ICD-10-CM | POA: Diagnosis present

## 2018-04-08 DIAGNOSIS — J449 Chronic obstructive pulmonary disease, unspecified: Secondary | ICD-10-CM | POA: Diagnosis present

## 2018-04-08 DIAGNOSIS — R0602 Shortness of breath: Secondary | ICD-10-CM | POA: Diagnosis present

## 2018-04-08 DIAGNOSIS — I160 Hypertensive urgency: Secondary | ICD-10-CM | POA: Diagnosis present

## 2018-04-08 HISTORY — DX: End stage renal disease: N18.6

## 2018-04-08 LAB — CBC
HCT: 33.8 % — ABNORMAL LOW (ref 39.0–52.0)
Hemoglobin: 10.3 g/dL — ABNORMAL LOW (ref 13.0–17.0)
MCH: 30.7 pg (ref 26.0–34.0)
MCHC: 30.5 g/dL (ref 30.0–36.0)
MCV: 100.9 fL — ABNORMAL HIGH (ref 80.0–100.0)
Platelets: 213 10*3/uL (ref 150–400)
RBC: 3.35 MIL/uL — AB (ref 4.22–5.81)
RDW: 15.4 % (ref 11.5–15.5)
WBC: 7.4 10*3/uL (ref 4.0–10.5)
nRBC: 0 % (ref 0.0–0.2)

## 2018-04-08 LAB — BASIC METABOLIC PANEL
ANION GAP: 24 — AB (ref 5–15)
BUN: 117 mg/dL — ABNORMAL HIGH (ref 6–20)
CO2: 11 mmol/L — ABNORMAL LOW (ref 22–32)
Calcium: 8.9 mg/dL (ref 8.9–10.3)
Chloride: 106 mmol/L (ref 98–111)
Creatinine, Ser: 22.46 mg/dL — ABNORMAL HIGH (ref 0.61–1.24)
GFR calc Af Amer: 2 mL/min — ABNORMAL LOW (ref >60)
GFR calc non Af Amer: 2 mL/min — ABNORMAL LOW (ref >60)
Glucose, Bld: 85 mg/dL (ref 70–99)
Potassium: 6 mmol/L — ABNORMAL HIGH (ref 3.5–5.1)
Sodium: 141 mmol/L (ref 135–145)

## 2018-04-08 LAB — LIPASE, BLOOD: LIPASE: 30 U/L (ref 11–51)

## 2018-04-08 LAB — TROPONIN I: Troponin I: 0.1 ng/mL (ref <0.03)

## 2018-04-08 MED ORDER — LIDOCAINE-PRILOCAINE 2.5-2.5 % EX CREA: 1.0000 "application " | TOPICAL_CREAM | CUTANEOUS | Status: DC | PRN

## 2018-04-08 MED ORDER — EPOETIN ALFA-EPBX 4000 UNIT/ML IJ SOLN
8000.00 | INTRAMUSCULAR | Status: DC
Start: ? — End: 2018-04-08

## 2018-04-08 MED ORDER — HYDRALAZINE HCL 20 MG/ML IJ SOLN
20.0000 mg | Freq: Once | INTRAMUSCULAR | Status: AC
  Administered 2018-04-08: 20 mg via INTRAVENOUS
  Filled 2018-04-08: qty 1

## 2018-04-08 MED ORDER — SODIUM CHLORIDE 0.9 % IV SOLN: 100.0000 mL | INTRAVENOUS | Status: DC | PRN

## 2018-04-08 MED ORDER — HEPARIN SODIUM (PORCINE) 1000 UNIT/ML IJ SOLN
3000.00 | INTRAMUSCULAR | Status: DC
Start: ? — End: 2018-04-08

## 2018-04-08 MED ORDER — HEPARIN SODIUM (PORCINE) 1000 UNIT/ML DIALYSIS: 1000.0000 [IU] | INTRAMUSCULAR | Status: DC | PRN

## 2018-04-08 MED ORDER — GENERIC EXTERNAL MEDICATION
1.80 | Status: DC
Start: ? — End: 2018-04-08

## 2018-04-08 MED ORDER — HEPARIN SODIUM (PORCINE) 1000 UNIT/ML IJ SOLN
2000.00 | INTRAMUSCULAR | Status: DC
Start: ? — End: 2018-04-08

## 2018-04-08 MED ORDER — ALTEPLASE 2 MG IJ SOLR: 2.0000 mg | Freq: Once | INTRAMUSCULAR | Status: DC | PRN

## 2018-04-08 MED ORDER — ONDANSETRON HCL 4 MG/2ML IJ SOLN
4.0000 mg | Freq: Once | INTRAMUSCULAR | Status: AC
  Administered 2018-04-08: 4 mg via INTRAVENOUS
  Filled 2018-04-08: qty 2

## 2018-04-08 MED ORDER — GENERIC EXTERNAL MEDICATION
1.90 | Status: DC
Start: ? — End: 2018-04-08

## 2018-04-08 MED ORDER — CHLORHEXIDINE GLUCONATE CLOTH 2 % EX PADS: 6.0000 | MEDICATED_PAD | Freq: Every day | CUTANEOUS | Status: DC

## 2018-04-08 MED ORDER — LIDOCAINE HCL (PF) 1 % IJ SOLN: 5.0000 mL | INTRAMUSCULAR | Status: DC | PRN

## 2018-04-08 MED ORDER — CALCITRIOL 0.5 MCG PO CAPS
1.50 | ORAL_CAPSULE | ORAL | Status: DC
Start: ? — End: 2018-04-08

## 2018-04-08 MED ORDER — NITROGLYCERIN IN D5W 200-5 MCG/ML-% IV SOLN
0.0000 ug/min | INTRAVENOUS | Status: DC
  Administered 2018-04-08: 5 ug/min via INTRAVENOUS
  Filled 2018-04-08: qty 250

## 2018-04-08 MED ORDER — PENTAFLUOROPROP-TETRAFLUOROETH EX AERO: 1.0000 "application " | INHALATION_SPRAY | CUTANEOUS | Status: DC | PRN

## 2018-04-08 MED ORDER — NITROGLYCERIN 2 % TD OINT
1.0000 [in_us] | TOPICAL_OINTMENT | Freq: Once | TRANSDERMAL | Status: AC
  Administered 2018-04-08: 1 [in_us] via TOPICAL
  Filled 2018-04-08: qty 1

## 2018-04-08 MED ORDER — AMLODIPINE BESYLATE 5 MG PO TABS
10.0000 mg | ORAL_TABLET | Freq: Once | ORAL | Status: AC
  Administered 2018-04-08: 10 mg via ORAL
  Filled 2018-04-08: qty 2

## 2018-04-08 MED ORDER — LABETALOL HCL 5 MG/ML IV SOLN
20.0000 mg | Freq: Once | INTRAVENOUS | Status: AC
  Administered 2018-04-08: 20 mg via INTRAVENOUS
  Filled 2018-04-08: qty 4

## 2018-04-08 MED ORDER — HYDRALAZINE HCL 20 MG/ML IJ SOLN
10.0000 mg | Freq: Four times a day (QID) | INTRAMUSCULAR | Status: DC | PRN
  Administered 2018-04-09: 10 mg via INTRAVENOUS
  Filled 2018-04-08 (×2): qty 1

## 2018-04-08 NOTE — ED Notes (Signed)
Patient's oxygen saturation level decreased to 87% on RA. Patient placed on 2L [place]. MD informed. RN will continue to monitor.

## 2018-04-08 NOTE — ED Notes (Signed)
Lab called to request phlebotomist ° °

## 2018-04-08 NOTE — H&P (Signed)
[facility] [place] NAME: Fernando Salas    MR#:  [id number]  DATE OF BIRTH:  [birth year]  DATE OF ADMISSION:  04/08/2018  PRIMARY CARE PHYSICIAN: System, Pcp Not In   REQUESTING/REFERRING PHYSICIAN: Dr. Harvest Dark  CHIEF COMPLAINT:   Chief Complaint  Patient presents with  . Emesis  . Shortness of Breath    HISTORY OF PRESENT ILLNESS:  Fernando Salas  is a 48 y.o. male with a known history of end-stage renal disease on Monday Wednesday Friday hemodialysis, hypertension, CAD status post prior stents, secondary hyperparathyroidism who has been discharged from [place] [facility] centers due to recurrent threats to dialysis staff has been presenting to local ED for his dialysis treatments.  He presents today complaining of abdominal pain, emesis and shortness of breath.  Past notes indicate that patient has been verbally abusive to the staff here and also at the dialysis centers, multiple threats in the past.  However very pleasant today.  He states his last dialysis was Wednesday, however could not go to dialysis today due to abdominal pain, nausea vomiting and shortness of breath.  Symptoms got worse later in the day and so presented to the emergency room.  He is spitting up frothy phlegm.  Potassium is at 6, appears slightly tachypneic and hypoxic and requiring 2 L oxygen here acutely. Blood pressure systolic greater than 702 consistently and is started on nitro drip.  Is being admitted to ICU.  PAST MEDICAL HISTORY:   Past Medical History:  Diagnosis Date  . CAD (coronary artery disease)    status post multiple myocardial infarctions  . COPD (chronic obstructive pulmonary disease) ([place])   . ESRD (end stage renal disease) ([place])   . Hyperlipidemia   . Hypertension   . Myocardial infarction ([place])    " I had 13 heart attacks, 8 massive"  . Proteinuria   . Renal insufficiency   . Secondary hyperparathyroidism of renal origin ([place])   .  Shortness of breath dyspnea    " when I drink a cup of cold water too fast"  . Sleep apnea    pt stated " long story" does not wear CPAP  . Stroke [facility])     PAST SURGICAL HISTORY:   Past Surgical History:  Procedure Laterality Date  . AV FISTULA PLACEMENT Left 01-24-14   By Dr. Hortencia Pilar in [place]  . AV FISTULA PLACEMENT Right 04/30/2014   Procedure: RIGHT BRACHIOCEPHALIC ARTERIOVENOUS (AV) FISTULA CREATION;  Surgeon: Elam Dutch, MD;  Location: [facility] OR;  Service: Vascular;  Laterality: Right;  . AV FISTULA PLACEMENT Left 05/31/2014   Procedure: Left INSERTION OF ARTERIOVENOUS (AV) GORE-TEX GRAFT ARM;  Surgeon: Elam Dutch, MD;  Location: [place];  Service: Vascular;  Laterality: Left;  . CARDIAC CATHETERIZATION    . CORONARY STENT PLACEMENT     13 stents total  . DIALYSIS/PERMA CATHETER INSERTION N/A 01/15/2017   Procedure: DIALYSIS/PERMA CATHETER INSERTION;  Surgeon: Katha Cabal, MD;  Location: [place] CV LAB;  Service: Cardiovascular;  Laterality: N/A;    SOCIAL HISTORY:   Social History   Tobacco Use  . Smoking status: Current Every Day Smoker    Packs/day: 0.25    Years: 25.00    Pack years: 6.25    Types: Cigarettes  . Smokeless tobacco: Never Used  Substance Use Topics  . Alcohol use: No    Alcohol/week: 0.0 standard drinks    Frequency: Never  Comment: occasional    FAMILY HISTORY:   Family History  Problem Relation Age of Onset  . Heart disease Mother   . Hyperlipidemia Mother   . Hypertension Mother   . Kidney disease Father   . Kidney disease Brother   . Hyperlipidemia Sister   . Hyperlipidemia Daughter   . Hypertension Sister   . Hypertension Daughter     DRUG ALLERGIES:   Allergies  Allergen Reactions  . Ibuprofen Other (See Comments)    Kidney   . Viagra [Sildenafil]     Lips swell   . Clonidine Derivatives Rash    REVIEW OF SYSTEMS:   Review of Systems  Constitutional: Negative for chills, fever,  malaise/fatigue and weight loss.  HENT: Negative for ear discharge, ear pain, hearing loss and nosebleeds.   Eyes: Negative for blurred vision, double vision and photophobia.  Respiratory: Positive for cough and shortness of breath. Negative for hemoptysis and wheezing.   Cardiovascular: Positive for leg swelling. Negative for chest pain, palpitations and orthopnea.  Gastrointestinal: Positive for abdominal pain, diarrhea and nausea. Negative for constipation, heartburn, melena and vomiting.  Genitourinary: Negative for dysuria, frequency and urgency.  Musculoskeletal: Negative for back pain, myalgias and neck pain.  Skin: Negative for rash.  Neurological: Negative for dizziness, tingling, sensory change, speech change, focal weakness and headaches.  Endo/Heme/Allergies: Does not bruise/bleed easily.  Psychiatric/Behavioral: Negative for depression.    MEDICATIONS AT HOME:   Prior to Admission medications   Medication Sig Start Date End Date Taking? Authorizing Provider  amLODipine (NORVASC) 10 MG tablet Take 1 tablet (10 mg total) by mouth daily. 05/01/17 05/01/18  Lisa Roca, MD  aspirin EC 81 MG EC tablet Take 1 tablet (81 mg total) by mouth daily. Patient not taking: Reported on 03/25/2018 11/03/16   Demetrios Loll, MD  atorvastatin (LIPITOR) 40 MG tablet Take 1 tablet (40 mg total) by mouth daily. 11/03/16   Demetrios Loll, MD  calcium acetate (PHOSLO) 667 MG capsule Take 1,334 mg by mouth 3 (three) times daily with meals. 12/03/17 12/03/18  [provider]  carvedilol (COREG) 25 MG tablet Take 1 tablet (25 mg total) by mouth 2 (two) times daily. 05/01/17 05/01/18  Lisa Roca, MD  clopidogrel (PLAVIX) 75 MG tablet Take 1 tablet (75 mg total) by mouth daily. 11/03/16   Demetrios Loll, MD  fluticasone [facility]) 50 MCG/ACT nasal spray INSTILL 1 SPRAY PER NARES DAILY 01/14/16   [provider]  hydrALAZINE (APRESOLINE) 100 MG tablet Take 1 tablet (100 mg total) by mouth 4 (four) times  daily. Patient taking differently: Take 100 mg by mouth every 8 (eight) hours.  05/01/17 05/01/18  Lisa Roca, MD  isosorbide dinitrate (ISORDIL) 30 MG tablet Take 30 mg by mouth 3 (three) times daily.     [provider]  Multiple Vitamin (MULTIVITAMIN) tablet Take 1 tablet daily by mouth.    [provider]  nitroGLYCERIN (NITROSTAT) 0.4 MG SL tablet Place 0.4 mg under the tongue every 5 (five) minutes as needed for chest pain.    [provider]      VITAL SIGNS:  Blood pressure (!) 254/140, pulse 92, temperature 97.9 F (36.6 C), temperature source Oral, resp. rate (!) 24, height 6' (1.829 m), weight 101.2 kg, SpO2 93 %.  PHYSICAL EXAMINATION:   Physical Exam  GENERAL:  47 y.o.-year-old patient lying in the bed with no acute distress.  EYES: Pupils equal, round, reactive to light and accommodation. No scleral icterus. Extraocular muscles intact.  HEENT: Head atraumatic, normocephalic. Oropharynx and nasopharynx clear.  NECK:  Supple, no jugular venous distention. No thyroid enlargement, no tenderness.  LUNGS: Normal breath sounds bilaterally, bibasilar rales. no wheezing, rhonchi or crepitation. No use of accessory muscles of respiration. Slightly tachypneic with minimal exertion CARDIOVASCULAR: S1, S2 normal. No  rubs, or gallops. 2/6 systolic murmur present Right chest permacath noted. ABDOMEN: Soft, nontender, nondistended. Bowel sounds present. No organomegaly or mass.  EXTREMITIES: No pedal edema, cyanosis, or clubbing.  NEUROLOGIC: Cranial nerves II through XII are intact. Muscle strength 5/5 in all extremities. Sensation intact. Gait not checked.  PSYCHIATRIC: The patient is alert and oriented x 3.  SKIN: No obvious rash, lesion, or ulcer.   LABORATORY PANEL:   CBC Recent Labs  Lab 04/08/18 1959  WBC 7.4  HGB 10.3*  HCT 33.8*  PLT 213    ------------------------------------------------------------------------------------------------------------------  Chemistries  Recent Labs  Lab 04/08/18 1959  NA 141  K 6.0*  CL 106  CO2 11*  GLUCOSE 85  BUN 117*  CREATININE 22.46*  CALCIUM 8.9   ------------------------------------------------------------------------------------------------------------------  Cardiac Enzymes Recent Labs  Lab 04/08/18 1959  TROPONINI 0.10*   ------------------------------------------------------------------------------------------------------------------  RADIOLOGY:  Dg Chest 2 View  Result Date: 04/08/2018 CLINICAL DATA:  Shortness of breath, nausea and vomiting today. History of end-stage renal disease on dialysis, missed dialysis today. History of COPD. EXAM: CHEST - 2 VIEW COMPARISON:  Chest radiograph March 25, 2018 FINDINGS: Cardiac silhouette is moderately enlarged, similar to prior examination. Coronary artery calcification and/or stent. Pulmonary vascular congestion interstitial prominence is unchanged. No pleural effusion or focal consolidation. No pneumothorax. Tunneled RIGHT dialysis catheter via internal jugular venous approach with distal tip projecting RIGHT atrium. No pneumothorax. Soft tissue planes and included osseous structures are unchanged. IMPRESSION: Stable cardiomegaly and mild pulmonary edema. Electronically Signed   By: Elon Alas M.D.   On: 04/08/2018 19:22    EKG:   Orders placed or performed during the hospital encounter of 04/08/18  . EKG 12-Lead  . EKG 12-Lead  . ED EKG  . ED EKG    IMPRESSION AND PLAN:   Fernando Salas  is a 48 y.o. male with a known history of end-stage renal disease on Monday Wednesday Friday hemodialysis, hypertension, CAD status post prior stents, secondary hyperparathyroidism who has been discharged from [place] dialysis centers due to recurrent threats to dialysis staff has been presenting to local ED for his dialysis  treatments.  He presents today complaining of abdominal pain, emesis and shortness of breath.  1.  Malignant/hypertensive urgency-restart home medications. -Started on nitro drip which can be continued for now.  2.  Acute pulmonary edema with hypoxic respiratory failure-we will need urgent dialysis.  Nephrology has been notified -Currently on 2 L oxygen via nasal cannula.  If not improving, consider BiPAP.  3.  End-stage renal disease-on Monday Wednesday Friday dialysis schedule.  Missed dialysis today. -Nephrology notified.  Patient will be dialyzed tonight  4.  CAD-stable now.  Continue cardiac medications from home  5.  DVT prophylaxis-subcutaneous heparin    All the records are reviewed and case discussed with ED provider. Management plans discussed with the patient, family and they are in agreement.  CODE STATUS: Full Code  TOTAL CRITICAL CARE TIME SPENT IN  TAKING CARE OF THIS PATIENT: 55 minutes.    Gladstone Lighter M.D on 04/08/2018 at 9:51 PM  Between 7am to 6pm - Pager - [phone]  After 6pm go to [email or web address] - [place]  [facility]  Office  [phone]  CC: Primary care physician; System, Pcp Not In

## 2018-04-08 NOTE — ED Notes (Signed)
Lab phlebotomist at bedside for specimen collection

## 2018-04-08 NOTE — ED Triage Notes (Signed)
Pt in via POV, reports new onset shortness of breath, and N/V today.  Pt is a dialysis patient, states he missed dialysis today because he didn't have transportation.  Pt hypertensive upon arrival, states he has been unable to keep his medications down today.  Pt ambulatory to triage, NAD noted at this time.

## 2018-04-08 NOTE — ED Notes (Signed)
Attempted IV x2 without success - will place IV team consult

## 2018-04-08 NOTE — ED Provider Notes (Addendum)
[facility] Emergency Department Provider Note  Time seen: 7:26 PM  I have reviewed the triage vital signs and the nursing notes.   HISTORY  Chief Complaint Emesis and Shortness of Breath    HPI Fernando Salas is a 48 y.o. male with a past medical history of CAD, COPD, hypertension, hyperlipidemia, MI, ESRD on HD Monday/Wednesday/Friday, presents to the emergency department for nausea, cough and sputum production as well as mild shortness of breath.  According to the patient he was supposed to get dialysis today but did not because he was not feeling well.  He states for the last several days he has been very nauseated spitting up phlegm/sputum.  States he has been nauseated but has not actually vomited.  States he has had loose stool.  Denies any dysuria creates a small amount of urine each day.  Denies any known fever.   Past Medical History:  Diagnosis Date  . CAD (coronary artery disease)    status post multiple myocardial infarctions  . Chronic kidney disease   . COPD (chronic obstructive pulmonary disease) ([place])   . Hyperlipidemia   . Hypertension   . Myocardial infarction ([place])    " I had 13 heart attacks, 8 massive"  . Proteinuria   . Renal insufficiency   . Secondary hyperparathyroidism of renal origin ([place])   . Shortness of breath dyspnea    " when I drink a cup of cold water too fast"  . Sleep apnea    pt stated " long story" does not wear CPAP  . Stroke [facility])     Patient Active Problem List   Diagnosis Date Noted  . Hypertensive urgency 03/25/2018  . Pulmonary edema 03/09/2017  . 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 Fluid overload 02/08/2017  . Accelerated hypertension 01/12/2017  . AKI (acute kidney injury) ([place])   . Unstable angina ([place]) 11/01/2016    Past Surgical History:  Procedure Laterality Date  . AV FISTULA PLACEMENT Left 01-24-14   By Dr. Hortencia Pilar in [place]  . AV FISTULA PLACEMENT Right 04/30/2014   Procedure: RIGHT BRACHIOCEPHALIC ARTERIOVENOUS (AV) FISTULA CREATION;  Surgeon: Elam Dutch, MD;  Location: [facility] OR;  Service: Vascular;  Laterality: Right;  . AV FISTULA PLACEMENT Left 05/31/2014   Procedure: Left INSERTION OF ARTERIOVENOUS (AV) GORE-TEX GRAFT ARM;  Surgeon: Elam Dutch, MD;  Location: [facility];  Service: Vascular;  Laterality: Left;  . CARDIAC CATHETERIZATION    . CORONARY STENT PLACEMENT     13 stents total  . DIALYSIS/PERMA CATHETER INSERTION N/A 01/15/2017   Procedure: DIALYSIS/PERMA CATHETER INSERTION;  Surgeon: Katha Cabal, MD;  Location: [facility] CV LAB;  Service: Cardiovascular;  Laterality: N/A;    Prior to Admission medications   Medication Sig Start Date End Date Taking? Authorizing Provider  amLODipine (NORVASC) 10 MG tablet Take 1 tablet (10 mg total) by mouth daily. 05/01/17 05/01/18  Lisa Roca, MD  aspirin EC 81 MG EC tablet Take 1 tablet (81 mg total) by mouth daily. Patient not taking: Reported on 03/25/2018 11/03/16   Demetrios Loll, MD  atorvastatin (LIPITOR) 40 MG tablet Take 1 tablet (40 mg total) by mouth daily. 11/03/16   Demetrios Loll, MD  calcium acetate (PHOSLO) 667 MG capsule Take 1,334 mg by mouth 3 (three) times daily with meals. 12/03/17 12/03/18  [provider]  carvedilol (COREG) 25 MG tablet Take 1 tablet (25 mg total) by mouth 2 (two) times daily. 05/01/17 05/01/18  Lisa Roca,  MD  clopidogrel (PLAVIX) 75 MG tablet Take 1 tablet (75 mg total) by mouth daily. 11/03/16   Demetrios Loll, MD  fluticasone [facility]) 50 MCG/ACT nasal spray INSTILL 1 SPRAY PER NARES DAILY 01/14/16   [provider]  hydrALAZINE (APRESOLINE) 100 MG tablet Take 1 tablet (100 mg total) by mouth 4 (four) times daily. Patient taking differently: Take 100 mg by mouth every 8 (eight) hours.  05/01/17 05/01/18  Lisa Roca, MD  isosorbide dinitrate (ISORDIL) 30 MG tablet Take 30 mg by mouth 3 (three) times daily.     [provider]  Multiple Vitamin  (MULTIVITAMIN) tablet Take 1 tablet daily by mouth.    [provider]  nitroGLYCERIN (NITROSTAT) 0.4 MG SL tablet Place 0.4 mg under the tongue every 5 (five) minutes as needed for chest pain.    [provider]    Allergies  Allergen Reactions  . Ibuprofen Other (See Comments)    Kidney   . Viagra [Sildenafil]     Lips swell   . Clonidine Derivatives Rash    Family History  Problem Relation Age of Onset  . Heart disease Mother   . Hyperlipidemia Mother   . Hypertension Mother   . Kidney disease Father   . Kidney disease Brother   . Hyperlipidemia Sister   . Hyperlipidemia Daughter   . Hypertension Sister   . Hypertension Daughter     Social History Social History   Tobacco Use  . Smoking status: Current Every Day Smoker    Packs/day: 0.25    Years: 25.00    Pack years: 6.25    Types: Cigarettes  . Smokeless tobacco: Never Used  Substance Use Topics  . Alcohol use: No    Alcohol/week: 0.0 standard drinks    Frequency: Never    Comment: occasional  . Drug use: Not Currently    Types: Marijuana    Comment: daily    Review of Systems Constitutional: Negative for fever. Cardiovascular: Negative for chest pain. Respiratory: Mild shortness of breath.  Occasional sputum production. Gastrointestinal: Negative for abdominal pain.  Positive for nausea.  Positive for loose stool. Genitourinary: Negative for urinary compaints, creates a small amount of urine each day. Musculoskeletal: Negative for musculoskeletal complaints Skin: Negative for skin complaints  Neurological: Negative for headache All other ROS negative  ____________________________________________   PHYSICAL EXAM:  VITAL SIGNS: ED Triage Vitals [04/08/18 1832]  Enc Vitals Group     BP (!) 230/129     Pulse Rate 97     Resp 20     Temp 97.9 F (36.6 C)     Temp Source Oral     SpO2 92 %     Weight 223 lb (101.2 kg)     Height 6' (1.829 m)     Head Circumference      Peak  Flow      Pain Score 0     Pain Loc      Pain Edu?      Excl. in [place]?    Constitutional: Alert and oriented. Well appearing and in no distress. Eyes: Normal exam ENT   Head: Normocephalic and atraumatic   Mouth/Throat: Mucous membranes are moist. Cardiovascular: Normal rate, regular rhythm. No murmur Respiratory: Normal respiratory effort without tachypnea nor retractions. Breath sounds are clear.  Patient will occasionally spit out sputum.  Right subclavian catheter present. Gastrointestinal: Soft and nontender. No distention.  Musculoskeletal: Nontender with normal range of motion in all extremities.  Neurologic:  Normal speech and language. No gross focal neurologic deficits Skin:  Skin is warm, dry and intact.  Psychiatric: Mood and affect are normal.   ____________________________________________    EKG  EKG viewed and interpreted by myself shows a normal sinus rhythm at 95 bpm with a slightly widened QRS, normal axis, normal intervals, nonspecific ST changes.  ____________________________________________    RADIOLOGY  X-ray consistent with mild pulmonary edema.  ____________________________________________   INITIAL IMPRESSION / ASSESSMENT AND PLAN / ED COURSE  Pertinent labs & imaging results that were available during my care of the patient were reviewed by me and considered in my medical decision making (see chart for details).  Patient presents to the emergency department for nausea, shortness of breath.  Patient did not receive dialysis today.  Differential would include fluid overload, uremia, hyperkalemia, infectious etiology, gastroenteritis.  We will check labs, chest x-ray and continue to closely monitor.  We will treat with Zofran while awaiting results.  Patient agreeable to plan of care.  Patient's x-ray is consistent with mild pulmonary edema.  Patient's blood pressure is significantly elevated currently 254/140.  We will dose nitroglycerin ointment  1 inch, 20 mg of IV hydralazine.  The remainder of the patient's lab work is pending.  We will discuss with nephrology to arrange for dialysis.  Patient will require admission to the hospitalist service.  Minimal blood pressure improvement after hydralazine and nitroglycerin paste.  We will start on a nitroglycerin drip, we will dose labetalol.  I discussed the patient with Dr. Holley Raring of nephrology who will arrange for dialysis for the patient.  CRITICAL CARE Performed by: Harvest Dark   Total critical care time: 30 minutes  Critical care time was exclusive of separately billable procedures and treating other patients.  Critical care was necessary to treat or prevent imminent or life-threatening deterioration.  Critical care was time spent personally by me on the following activities: development of treatment plan with patient and/or surrogate as well as nursing, discussions with consultants, evaluation of patient's response to treatment, examination of patient, obtaining history from patient or surrogate, ordering and performing treatments and interventions, ordering and review of laboratory studies, ordering and review of radiographic studies, pulse oximetry and re-evaluation of patient's condition.   ____________________________________________   FINAL CLINICAL IMPRESSION(S) / ED DIAGNOSES  Nausea Fluid overload Pulmonary edema Hypertension   Harvest Dark, MD 04/08/18 2124    Harvest Dark, MD 04/22/18 [phone]

## 2018-04-09 LAB — CBC
HEMATOCRIT: 29.6 % — AB (ref 39.0–52.0)
Hemoglobin: 9.7 g/dL — ABNORMAL LOW (ref 13.0–17.0)
MCH: 31.1 pg (ref 26.0–34.0)
MCHC: 32.8 g/dL (ref 30.0–36.0)
MCV: 94.9 fL (ref 80.0–100.0)
Platelets: 182 10*3/uL (ref 150–400)
RBC: 3.12 MIL/uL — ABNORMAL LOW (ref 4.22–5.81)
RDW: 15.3 % (ref 11.5–15.5)
WBC: 6.6 10*3/uL (ref 4.0–10.5)
nRBC: 0 % (ref 0.0–0.2)

## 2018-04-09 LAB — BASIC METABOLIC PANEL
Anion gap: 11 (ref 5–15)
BUN: 60 mg/dL — ABNORMAL HIGH (ref 6–20)
CHLORIDE: 99 mmol/L (ref 98–111)
CO2: 27 mmol/L (ref 22–32)
Calcium: 7.8 mg/dL — ABNORMAL LOW (ref 8.9–10.3)
Creatinine, Ser: 15.28 mg/dL — ABNORMAL HIGH (ref 0.61–1.24)
GFR calc Af Amer: 4 mL/min — ABNORMAL LOW (ref >60)
GFR calc non Af Amer: 3 mL/min — ABNORMAL LOW (ref >60)
Glucose, Bld: 110 mg/dL — ABNORMAL HIGH (ref 70–99)
Potassium: 4 mmol/L (ref 3.5–5.1)
Sodium: 137 mmol/L (ref 135–145)

## 2018-04-09 LAB — PHOSPHORUS: Phosphorus: 8.5 mg/dL — ABNORMAL HIGH (ref 2.5–4.6)

## 2018-04-09 LAB — MRSA PCR SCREENING: MRSA by PCR: NEGATIVE

## 2018-04-09 LAB — TROPONIN I: Troponin I: 0.14 ng/mL (ref <0.03)

## 2018-04-09 MED ORDER — ATORVASTATIN CALCIUM 20 MG PO TABS
40.0000 mg | ORAL_TABLET | Freq: Every day | ORAL | Status: DC
  Administered 2018-04-09: 40 mg via ORAL
  Filled 2018-04-09: qty 2

## 2018-04-09 MED ORDER — ADULT MULTIVITAMIN W/MINERALS CH
1.0000 | ORAL_TABLET | Freq: Every day | ORAL | Status: DC
  Administered 2018-04-09: 1 via ORAL
  Filled 2018-04-09: qty 1

## 2018-04-09 MED ORDER — ACETAMINOPHEN 325 MG PO TABS: 650.0000 mg | ORAL_TABLET | Freq: Four times a day (QID) | ORAL | Status: DC | PRN

## 2018-04-09 MED ORDER — CLOPIDOGREL BISULFATE 75 MG PO TABS
75.0000 mg | ORAL_TABLET | Freq: Every day | ORAL | Status: DC
  Administered 2018-04-09: 75 mg via ORAL
  Filled 2018-04-09: qty 1

## 2018-04-09 MED ORDER — ISOSORBIDE DINITRATE 30 MG PO TABS
30.0000 mg | ORAL_TABLET | Freq: Three times a day (TID) | ORAL | Status: DC
  Administered 2018-04-09 (×2): 30 mg via ORAL
  Filled 2018-04-09 (×6): qty 1

## 2018-04-09 MED ORDER — ONDANSETRON HCL 4 MG/2ML IJ SOLN: 4.0000 mg | Freq: Four times a day (QID) | INTRAMUSCULAR | Status: DC | PRN

## 2018-04-09 MED ORDER — LABETALOL HCL 5 MG/ML IV SOLN
INTRAVENOUS | Status: AC
  Filled 2018-04-09: qty 4

## 2018-04-09 MED ORDER — NITROGLYCERIN 0.4 MG SL SUBL: 0.4000 mg | SUBLINGUAL_TABLET | SUBLINGUAL | Status: DC | PRN

## 2018-04-09 MED ORDER — ONDANSETRON HCL 4 MG PO TABS: 4.0000 mg | ORAL_TABLET | Freq: Four times a day (QID) | ORAL | Status: DC | PRN

## 2018-04-09 MED ORDER — HYDRALAZINE HCL 50 MG PO TABS
100.0000 mg | ORAL_TABLET | Freq: Four times a day (QID) | ORAL | Status: DC
  Administered 2018-04-09 (×4): 100 mg via ORAL
  Filled 2018-04-09 (×4): qty 2

## 2018-04-09 MED ORDER — ACETAMINOPHEN 650 MG RE SUPP: 650.0000 mg | Freq: Four times a day (QID) | RECTAL | Status: DC | PRN

## 2018-04-09 MED ORDER — LABETALOL HCL 5 MG/ML IV SOLN
20.0000 mg | Freq: Once | INTRAVENOUS | Status: AC
  Administered 2018-04-09: 20 mg via INTRAVENOUS

## 2018-04-09 MED ORDER — AMLODIPINE BESYLATE 10 MG PO TABS
10.0000 mg | ORAL_TABLET | Freq: Every day | ORAL | Status: DC
  Administered 2018-04-09: 10 mg via ORAL
  Filled 2018-04-09: qty 2

## 2018-04-09 MED ORDER — MORPHINE SULFATE (PF) 2 MG/ML IV SOLN
0.5000 mg | Freq: Once | INTRAVENOUS | Status: AC
  Administered 2018-04-09: 0.5 mg via INTRAVENOUS
  Filled 2018-04-09: qty 1

## 2018-04-09 MED ORDER — CARVEDILOL 25 MG PO TABS
25.0000 mg | ORAL_TABLET | Freq: Two times a day (BID) | ORAL | Status: DC
  Administered 2018-04-09 (×2): 25 mg via ORAL
  Filled 2018-04-09 (×2): qty 1

## 2018-04-09 MED ORDER — CALCIUM ACETATE (PHOS BINDER) 667 MG PO CAPS
1334.0000 mg | ORAL_CAPSULE | Freq: Three times a day (TID) | ORAL | Status: DC
  Administered 2018-04-09 – 2018-04-10 (×2): 1334 mg via ORAL
  Filled 2018-04-09 (×2): qty 2

## 2018-04-09 MED ORDER — HEPARIN SODIUM (PORCINE) 5000 UNIT/ML IJ SOLN
5000.0000 [IU] | Freq: Three times a day (TID) | INTRAMUSCULAR | Status: DC
  Administered 2018-04-09 – 2018-04-10 (×3): 5000 [IU] via SUBCUTANEOUS
  Filled 2018-04-09 (×3): qty 1

## 2018-04-09 NOTE — ED Notes (Signed)
Received call from [facility] stating that patient was dialysis and that they were reassigning his bed to [facility] and that bed would be reassigned

## 2018-04-09 NOTE — Progress Notes (Signed)
Pre Tx HD    04/09/18 0141  Vital Signs  Temp 98.5 F (36.9 C)  Temp Source Oral  Pulse Rate 72  Pulse Rate Source Monitor  Resp (!) 22  BP (!) 189/104  BP Location Right Arm  BP Method Automatic  Patient Position (if appropriate) Lying  Oxygen Therapy  SpO2 99 %  O2 Device Nasal Cannula  O2 Flow Rate (L/min) 2 L/min  Pain Assessment  Pain Scale 0-10  Pain Score 0  Dialysis Weight  Weight 101.2 kg  Type of Weight Pre-Dialysis  Time-Out for Hemodialysis  What Procedure?  (HD)  Pt Identifiers(min of two) First/Last Name;MRN/Account#  Correct Site? Yes  Correct Side? Yes  Correct Procedure? Yes  Consents Verified? Yes  Rad Studies Available? N/A  Safety Precautions Reviewed? Yes  Engineer, civil (consulting) Number 5  Station Number 3  UF/Alarm Test Passed  Conductivity: Meter 13.8  Conductivity: Machine  13.8  pH 7.4  Reverse Osmosis main  Normal Saline Lot Number [id number]  Dialyzer Lot Number [id number]  Disposable Set Lot Number [id number]  Machine Temperature 98.6 F (37 C)  Musician and Audible Yes  Blood Lines Intact and Secured Yes  Pre Treatment Patient Checks  Vascular access used during treatment Catheter  Hepatitis B Surface Antigen Results  (unknown )  Hepatitis B Surface Antibody  (unknown )  Hemodialysis Consent Verified Yes  ECG (Telemetry) Monitor On Yes  Prime Ordered Normal Saline  Length of  DialysisTreatment -hour(s) 3.5 Hour(s)  Dialyzer Elisio 17H NR  Dialysate 2K, 2.5 Ca  Dialysate Flow Ordered 800  Blood Flow Rate Ordered 400 mL/min  Ultrafiltration Goal 3 Liters  Dialysis Blood Pressure Support Ordered Normal Saline  Education / Care Plan  Dialysis Education Provided Yes  Documented Education in Care Plan Yes  Hemodialysis Catheter Right Subclavian Double-lumen  Placement Date/Time: (c) 02/10/17 1343   Placed prior to admission: Yes  Orientation: Right  Access Location: Subclavian  Hemodialysis Catheter Type: Double-lumen  Site  Condition No complications  Blue Lumen Status Capped (Central line)  Red Lumen Status Capped (Central line)  Purple Lumen Status N/A  Dressing Type Biopatch  Dressing Status Clean;Dry;Intact  Drainage Description None

## 2018-04-09 NOTE — ED Notes (Signed)
Pt refuses at this time to be stuck again for attempt at IV - also refused IV team consult Charge nurse Brandy aware that pt does not have 2nd access

## 2018-04-09 NOTE — Progress Notes (Signed)
[place] [facility] [place] NAME: Fernando Salas    MR#:  [id number]  DATE OF BIRTH:  31-May-1970  SUBJECTIVE:   Patient admitted to the hospital secondary nausea vomiting and shortness of breath and noted to have accelerated hypertension.  This morning when seen patient was a nitroglycerin drip but now has been weaned off of it.  Patient denies any headache, chest pains, nausea vomiting or palpitations.  Tolerating p.o. well.  REVIEW OF SYSTEMS:    Review of Systems  Constitutional: Negative for chills and fever.  HENT: Negative for congestion and tinnitus.   Eyes: Negative for blurred vision and double vision.  Respiratory: Negative for cough, shortness of breath and wheezing.   Cardiovascular: Negative for chest pain, orthopnea and PND.  Gastrointestinal: Negative for abdominal pain, diarrhea, nausea and vomiting.  Genitourinary: Negative for dysuria and hematuria.  Neurological: Negative for dizziness, sensory change and focal weakness.  All other systems reviewed and are negative.   Nutrition: Renal fluid restriction Tolerating Diet: Yes Tolerating PT:  Ambulatory  DRUG ALLERGIES:   Allergies  Allergen Reactions  . Ibuprofen Other (See Comments)    Kidney   . Viagra [Sildenafil]     Lips swell   . Clonidine Derivatives Rash    VITALS:  Blood pressure (!) 142/77, pulse 65, temperature 98.5 F (36.9 C), temperature source Oral, resp. rate (!) 28, height 6' (1.829 m), weight 98.9 kg, SpO2 (!) 89 %.  PHYSICAL EXAMINATION:   Physical Exam  GENERAL:  48 y.o.-year-old patient lying in bed in no acute distress.  EYES: Pupils equal, round, reactive to light and accommodation. No scleral icterus. Extraocular muscles intact.  HEENT: Head atraumatic, normocephalic. Oropharynx and nasopharynx clear.  NECK:  Supple, no jugular venous distention. No thyroid enlargement, no tenderness.  LUNGS: Normal breath sounds bilaterally, no wheezing,  rales, rhonchi. No use of accessory muscles of respiration.  CARDIOVASCULAR: S1, S2 normal. No murmurs, rubs, or gallops.  ABDOMEN: Soft, nontender, nondistended. Bowel sounds present. No organomegaly or mass.  EXTREMITIES: No cyanosis, clubbing or edema b/l.    NEUROLOGIC: Cranial nerves II through XII are intact. No focal Motor or sensory deficits b/l.   PSYCHIATRIC: The patient is alert and oriented x 3.  SKIN: No obvious rash, lesion, or ulcer.   Right chest wall Perm-cath in place.   LABORATORY PANEL:   CBC Recent Labs  Lab 04/08/18 1959  WBC 7.4  HGB 10.3*  HCT 33.8*  PLT 213   ------------------------------------------------------------------------------------------------------------------  Chemistries  Recent Labs  Lab 04/08/18 1959  NA 141  K 6.0*  CL 106  CO2 11*  GLUCOSE 85  BUN 117*  CREATININE 22.46*  CALCIUM 8.9   ------------------------------------------------------------------------------------------------------------------  Cardiac Enzymes Recent Labs  Lab 04/09/18 0632  TROPONINI 0.14*   ------------------------------------------------------------------------------------------------------------------  RADIOLOGY:  Dg Chest 2 View  Result Date: 04/08/2018 CLINICAL DATA:  Shortness of breath, nausea and vomiting today. History of end-stage renal disease on dialysis, missed dialysis today. History of COPD. EXAM: CHEST - 2 VIEW COMPARISON:  Chest radiograph March 25, 2018 FINDINGS: Cardiac silhouette is moderately enlarged, similar to prior examination. Coronary artery calcification and/or stent. Pulmonary vascular congestion interstitial prominence is unchanged. No pleural effusion or focal consolidation. No pneumothorax. Tunneled RIGHT dialysis catheter via internal jugular venous approach with distal tip projecting RIGHT atrium. No pneumothorax. Soft tissue planes and included osseous structures are unchanged. IMPRESSION: Stable cardiomegaly and mild  pulmonary edema. Electronically Signed   By:  Elon Alas M.D.   On: 04/08/2018 19:22     ASSESSMENT AND PLAN:   Fernando Salas  is a 48 y.o. male with a known history of end-stage renal disease on Monday Wednesday Friday hemodialysis, hypertension, CAD status post prior stents, secondary hyperparathyroidism who has been discharged from [place] dialysis centers due to recurrent threats to dialysis staff has been presenting to local ED for his dialysis treatments.  He presents today complaining of abdominal pain, emesis and shortness of breath.  1.  Malignant/hypertensive urgency-blood pressure significantly improved since yesterday.  Currently on a nitroglycerin drip but will wean off.  Continue patient's oral medication.  Coreg, Norvasc, hydralazine.   -Continue as needed IV hydralazine and labetalol.  2.  Acute pulmonary edema with hypoxic respiratory failure -Seen by nephrology underwent hemodialysis yesterday.  Shortness of breath and hypoxemia has improved.  Continue further care as per nephrology.  3.  End-stage renal disease-on Monday Wednesday Friday dialysis schedule.  Missed dialysis today. -Nephrology notified.  Pt. Was dialyzed yesterday and cont. Further care as per them  4.  CAD-stable now.   Continue carvedilol, Plavix, atorvastatin, Norvasc.  Continue Imdur  5.  Secondary hyperparathyroidism-continue PhosLo.     All the records are reviewed and case discussed with Care Management/Social Worker. Management plans discussed with the patient, family and they are in agreement.  CODE STATUS: Full code  DVT Prophylaxis: Hep. SQ  TOTAL TIME TAKING CARE OF THIS PATIENT: 30 minutes.   POSSIBLE D/C IN 1-2 DAYS, DEPENDING ON CLINICAL CONDITION.   Fernando Salas M.D on 04/09/2018 at 3:34 PM  Between 7am to 6pm - Pager - [phone]  After 6pm go to [email or web address] - Proofreader  [facility]  Office  [phone]  CC: Primary care  physician; System, Pcp Not In

## 2018-04-09 NOTE — Progress Notes (Signed)
HD Tx Start   04/09/18 0201  Vital Signs  Pulse Rate 75  Pulse Rate Source Monitor  Resp (!) 22  BP (!) 200/116  BP Location Right Arm  BP Method Automatic  Patient Position (if appropriate) Lying  Oxygen Therapy  SpO2 95 %  O2 Device Room Air  O2 Flow Rate (L/min)  (pt removed nasal cannula / refusing oxygen, monitoring o2sat)  During Hemodialysis Assessment  Blood Flow Rate (mL/min) 400 mL/min  Arterial Pressure (mmHg) -130 mmHg  Venous Pressure (mmHg) 140 mmHg  Transmembrane Pressure (mmHg) 90 mmHg  Ultrafiltration Rate (mL/min) 990 mL/min  Dialysate Flow Rate (mL/min) 800 ml/min  Conductivity: Machine  13.8  HD Safety Checks Performed Yes  Dialysis Fluid Bolus Normal Saline  Bolus Amount (mL) 250 mL  Intra-Hemodialysis Comments Tx initiated (tx initiated w out difficulty )  Hemodialysis Catheter Right Subclavian Double-lumen  Placement Date/Time: (c) 02/10/17 1343   Placed prior to admission: Yes  Orientation: Right  Access Location: Subclavian  Hemodialysis Catheter Type: Double-lumen  Site Condition No complications  Blue Lumen Status Infusing  Red Lumen Status Infusing

## 2018-04-09 NOTE — ED Notes (Signed)
This RN spoke to MD Marcille Blanco and updated him on patient status. MD ordered repeat EKG, repeat Troponin, and 0.5 mg of Morphine.

## 2018-04-09 NOTE — ED Notes (Signed)
Pt removed O2 and desat to 87% - replaced 2L and improved to 94%

## 2018-04-09 NOTE — ED Notes (Signed)
ED TO INPATIENT HANDOFF REPORT  Name/Age/Gender Fernando Salas 48 y.o. male  Code Status Code Status History    Date Active Date Inactive Code Status Order ID Comments User Context   03/25/2018 1129 03/26/2018 1534 Full Code [id number]  Fritzi Mandes, MD ED   03/09/2017 0224 03/11/2017 1440 Full Code [id number]  Saundra Shelling, MD Inpatient   02/08/2017 2337 02/12/2017 1557 Full Code [id number]  Gorden Harms, MD Inpatient   01/12/2017 0954 01/16/2017 1603 Full Code [id number]  Loletha Grayer, MD ED   11/01/2016 0329 11/02/2016 1525 Full Code [id number]  Hugelmeyer, Ubaldo Glassing, DO Inpatient      Home/SNF/Other Home  Chief Complaint Hypertensive urgency, malignant [I16.0]  Level of Care/Admitting Diagnosis ED Disposition    ED Disposition Condition [place]: [place] [[id number]]  Level of Care: Med-Surg [16]  Diagnosis: Hypertensive urgency [465035]  Admitting Physician: Henreitta Leber [[id number]]  Attending Physician: Henreitta Leber [[id number]]  Estimated length of stay: past midnight tomorrow  Certification:: I certify this patient will need inpatient services for at least 2 midnights  PT Class (Do Not Modify): Inpatient [101]  PT Acc Code (Do Not Modify): Private [1]       Medical History Past Medical History:  Diagnosis Date  . CAD (coronary artery disease)    status post multiple myocardial infarctions  . COPD (chronic obstructive pulmonary disease) ([place])   . ESRD (end stage renal disease) ([place])   . Hyperlipidemia   . Hypertension   . Myocardial infarction ([place])    " I had 13 heart attacks, 8 massive"  . Proteinuria   . Renal insufficiency   . Secondary hyperparathyroidism of renal origin ([place])   . Shortness of breath dyspnea    " when I drink a cup of cold water too fast"  . Sleep apnea    pt stated " long story" does not wear CPAP  . Stroke [facility])     Allergies Allergies  Allergen Reactions  . Ibuprofen Other (See  Comments)    Kidney   . Viagra [Sildenafil]     Lips swell   . Clonidine Derivatives Rash    IV Location/Drains/Wounds Patient Lines/Drains/Airways Status   Active Line/Drains/Airways    Name:   Placement date:   Placement time:   Site:   Days:   Peripheral IV 04/09/18 Left Forearm   04/09/18    0635    Forearm   less than 1   Fistula / Graft Right Upper arm Arteriovenous fistula   04/30/14    0826    Upper arm   1440   Fistula / Graft Left Upper arm Arteriovenous vein graft   05/31/14    1423    Upper arm   1409   Hemodialysis Catheter Right Subclavian Double-lumen   02/10/17    1343    Subclavian   423          Labs/Imaging Results for orders placed or performed during the hospital encounter of 04/08/18 (from the past 48 hour(s))  Basic metabolic panel     Status: Abnormal   Collection Time: 04/08/18  7:59 PM  Result Value Ref Range   Sodium 141 135 - 145 mmol/L    Comment: LYTES VERIFIED BY REPEAT TESTING.  TFK   Potassium 6.0 (H) 3.5 - 5.1 mmol/L   Chloride 106 98 - 111 mmol/L   CO2 11 (L) 22 - 32 mmol/L   Glucose, Bld 85 70 -  99 mg/dL   BUN 117 (H) 6 - 20 mg/dL    Comment: RESULT CONFIRMED BY MANUAL DILUTION.  TFK   Creatinine, Ser 22.46 (H) 0.61 - 1.24 mg/dL   Calcium 8.9 8.9 - 10.3 mg/dL   GFR calc non Af Amer 2 (L) >60 mL/min   GFR calc Af Amer 2 (L) >60 mL/min   Anion gap 24 (H) 5 - 15    Comment: Performed at [facility], [address] [place], [place] [address]  CBC     Status: Abnormal   Collection Time: 04/08/18  7:59 PM  Result Value Ref Range   WBC 7.4 4.0 - 10.5 K/uL   RBC 3.35 (L) 4.22 - 5.81 MIL/uL   Hemoglobin 10.3 (L) 13.0 - 17.0 g/dL   HCT 33.8 (L) 39.0 - 52.0 %   MCV 100.9 (H) 80.0 - 100.0 fL   MCH 30.7 26.0 - 34.0 pg   MCHC 30.5 30.0 - 36.0 g/dL   RDW 15.4 11.5 - 15.5 %   Platelets 213 150 - 400 K/uL   nRBC 0.0 0.0 - 0.2 %    Comment: Performed at [facility], [address] [place], [place] [address]  Troponin I -  ONCE - STAT     Status: Abnormal   Collection Time: 04/08/18  7:59 PM  Result Value Ref Range   Troponin I 0.10 (HH) <0.03 ng/mL    Comment: CRITICAL RESULT CALLED TO, READ BACK BY AND VERIFIED WITH: Marquis Lunch AT 2052 04/08/2018.  TFK Performed at [facility], [address] [place], [place] [address]   Lipase, blood     Status: None   Collection Time: 04/08/18  7:59 PM  Result Value Ref Range   Lipase 30 11 - 51 U/L    Comment: Performed at [facility], [address] [place], [place] [address]  Phosphorus     Status: Abnormal   Collection Time: 04/08/18 11:20 PM  Result Value Ref Range   Phosphorus 8.5 (H) 2.5 - 4.6 mg/dL    Comment: Performed at [facility], [address] [place], [place] [address]  Troponin I - ONCE - STAT     Status: Abnormal   Collection Time: 04/09/18  6:32 AM  Result Value Ref Range   Troponin I 0.14 (HH) <0.03 ng/mL    Comment: CRITICAL VALUE NOTED. VALUE IS CONSISTENT WITH PREVIOUSLY REPORTED/CALLED VALUE...[facility] Performed at [facility], [address] [place], [place] [address]    Dg Chest 2 View  Result Date: 04/08/2018 CLINICAL DATA:  Shortness of breath, nausea and vomiting today. History of end-stage renal disease on dialysis, missed dialysis today. History of COPD. EXAM: CHEST - 2 VIEW COMPARISON:  Chest radiograph March 25, 2018 FINDINGS: Cardiac silhouette is moderately enlarged, similar to prior examination. Coronary artery calcification and/or stent. Pulmonary vascular congestion interstitial prominence is unchanged. No pleural effusion or focal consolidation. No pneumothorax. Tunneled RIGHT dialysis catheter via internal jugular venous approach with distal tip projecting RIGHT atrium. No pneumothorax. Soft tissue planes and included osseous structures are unchanged. IMPRESSION: Stable cardiomegaly and mild pulmonary edema. Electronically Signed   By: Elon Alas M.D.   On: 04/08/2018 19:22     Pending Labs Unresulted Labs (From admission, onward)    Start     Ordered   04/09/18 0246  Hepatitis B surface antibody  Once,   R     04/09/18 0246   04/09/18 0246  Hepatitis B surface antigen  Once,   R     04/09/18 0246   04/08/18 2215  MRSA PCR Screening  Once,   STAT     04/08/18 2214   Signed and Held  HIV antibody (Routine Testing)  Once,   R     Signed and Held   Signed and Held  Basic metabolic panel  Tomorrow morning,   R     Signed and Held   Signed and Held  CBC  Tomorrow morning,   R     Signed and Held          Vitals/Pain Today's Vitals   04/09/18 1443 04/09/18 1444 04/09/18 1445 04/09/18 1500  BP:   (!) 163/106 (!) 142/77  Pulse:   65 65  Resp:   11 (!) 28  Temp:      TempSrc:      SpO2: (!) 87% 94% 96% (!) 89%  Weight:      Height:      PainSc:        Isolation Precautions No active isolations  Medications Medications  nitroGLYCERIN 50 mg in dextrose 5 % 250 mL (0.2 mg/mL) infusion (0 mcg/min Intravenous Stopped 04/09/18 1119)  amLODipine (NORVASC) tablet 10 mg (10 mg Oral Given 04/09/18 0959)  carvedilol (COREG) tablet 25 mg (25 mg Oral Given 04/09/18 0959)  hydrALAZINE (APRESOLINE) tablet 100 mg (100 mg Oral Given 04/09/18 1445)  hydrALAZINE (APRESOLINE) injection 10 mg (has no administration in time range)  ondansetron (ZOFRAN) injection 4 mg (4 mg Intravenous Given 04/08/18 1955)  hydrALAZINE (APRESOLINE) injection 20 mg (20 mg Intravenous Given 04/08/18 2041)  nitroGLYCERIN (NITROGLYN) 2 % ointment 1 inch (1 inch Topical Given 04/08/18 2038)  labetalol (NORMODYNE,TRANDATE) injection 20 mg (20 mg Intravenous Given 04/08/18 2202)  amLODipine (NORVASC) tablet 10 mg (10 mg Oral Given 04/08/18 2243)  labetalol (NORMODYNE,TRANDATE) injection 20 mg (20 mg Intravenous Given 04/09/18 0103)  morphine 2 MG/ML injection 0.5 mg (0.5 mg Intravenous Given 04/09/18 0637)    Mobility walks

## 2018-04-09 NOTE — ED Notes (Signed)
Pt desat - encouraged deep breathing and O2 sat improved to 94% on RA

## 2018-04-09 NOTE — ED Notes (Signed)
Dialysis RN called this RN and reported that transport team has not yet arrived and patient has new onset 10 out of 10 left chest pain radiating back. Rapid response was called. ED charge RN informed; ED charge RN went to dialysis unit to retrieve patient. Patient was given 2 SL nitroglycerin with decrease in pain to 8 of 10. Prior to transport back to ED.

## 2018-04-09 NOTE — ED Notes (Signed)
Called bed placement to make sure they had seen change to admit any floor - they noticed when called and were working on assignment

## 2018-04-09 NOTE — ED Notes (Signed)
This RN took report from dialysis RN prior to transport back to ED. Per Dialysis RN, patient began to c/o abdominal pain/cramping and so dialysis was stopped with 2.3kg taken off - normal goal is 3kg. Last set of vitals prior to transport per dialysis RN:   BP 198/106, HR 98, Oxygen 94% on 2L , Temp 98.5, RR20.

## 2018-04-09 NOTE — ED Notes (Signed)
Pt given lunch tray.

## 2018-04-09 NOTE — ED Notes (Addendum)
Had secretary call to check status of admission - bed placement states they are awaiting a bed on 2A

## 2018-04-09 NOTE — Progress Notes (Signed)
Pre Tx HD Assessment    04/09/18 0146  Neurological  Level of Consciousness Alert  Orientation Level Oriented X4  Respiratory  Respiratory Pattern Regular;Dyspnea with exertion  Chest Assessment Chest expansion symmetrical  Bilateral Breath Sounds Diminished;Coarse crackles  Cardiac  Pulse Regular  Heart Sounds S1, S2  ECG Monitor Yes  Vascular  R Radial Pulse +3  L Radial Pulse +3  Integumentary  Integumentary (WDL) X  Skin Color Appropriate for ethnicity  Additional Integumentary Comments  (HD CVC Access)  Musculoskeletal  Musculoskeletal (WDL) WDL  GU Assessment  Genitourinary (WDL) X  Genitourinary Symptoms  (HD)  Psychosocial  Psychosocial (WDL) WDL  Patient Behaviors Calm;Cooperative  Emotional support given Given to patient

## 2018-04-09 NOTE — Progress Notes (Signed)
Post HD Tx  Tolerated tx well, aside from cramping episode with approximately one hour remaining of tx. For this reason, UF of 3064mL not reached; Net UF removal 2398mL. Pt cramping resolved and UF back on prior to tx end. Early tx termination d/t clotting of venous chamber, see flowsheet note, MD aware. 13 minutes left remaining. Pt refused CVC dressing change -- reports having been treated most recently at [facility]; missing biopatch. Educated on infection risk of lacking proper dressing, still refused. Stable and ok to return to ED.     04/09/18 0526  Vital Signs  Temp 98.5 F (36.9 C)  Temp Source Oral  Pulse Rate 77  Pulse Rate Source Monitor  Resp 20  BP (!) 198/106  BP Location Right Arm  BP Method Automatic  Patient Position (if appropriate) Lying  Oxygen Therapy  SpO2 94 %  O2 Device Nasal Cannula  O2 Flow Rate (L/min) 2 L/min  Dialysis Weight  Weight 98.9 kg  Type of Weight Post-Dialysis

## 2018-04-09 NOTE — ED Notes (Signed)
Per admitting physician to titrate pt off of nitro drip and then message him for placement on any floor - will decrease nitro by 5 every 15 minutes Also per admitting physician to release BP meds and admin at this time

## 2018-04-09 NOTE — ED Notes (Signed)
Patient transported to dialysis

## 2018-04-09 NOTE — Progress Notes (Signed)
Post Tx Note (add-on to previous post-tx note, d/t episode of chest pain):  After giving report to ED RN and while waiting for transport back to ED, pt voiced experiencing "chest pain/pressure", 10/10. Vitals obtained (see below), pt given nitrostat at 0610 @onset  of pain with little to no relief (pain 9/10) at 0615, and thereafter given a second nitrostat. Rapid response team called and ED arrives to transport patient. Pain relieved to voiced rating 6/10 (and pt reports improvement of s/s at 0620).  BP remains elevated prior to transport, RN notified of event, MD notified.     04/09/18 9622  Vital Signs  Pulse Rate 72  Pulse Rate Source Monitor  Resp 16  BP (!) 211/109  BP Location Right Arm  BP Method Automatic  Patient Position (if appropriate) Lying  Oxygen Therapy  SpO2 98 %  O2 Device Nasal Cannula  O2 Flow Rate (L/min) 2 L/min  Pain Assessment  Pain Scale 0-10  Pain Score 6  Pain Type Acute pain  Pain Location Chest  Pain Orientation Mid  Pain Descriptors / Indicators Pressure  Patients Stated Pain Goal  (pt states "feeling alot better now",reports pain@6 /10)  Pain Intervention(s) MD notified (Comment);Other (Comment) (Rapid response team and ED RN at [facility], transport to ED. )

## 2018-04-09 NOTE — ED Notes (Signed)
Attempted to call report and was advised that nurse would return the call

## 2018-04-09 NOTE — ED Notes (Signed)
Pt continues with decreaed O2 sat between 85-89% - placed on 2L O2 via n/c

## 2018-04-09 NOTE — ED Notes (Signed)
Report given to Terry RN

## 2018-04-09 NOTE — ED Notes (Signed)
Dr Tor Netters notified that pt was off nitro drip and c/o no chest pain or shortness of breath - pt resting comfortable in bed asleep/snoring - provider will place orders for pt to be admitted to any floor at this time

## 2018-04-09 NOTE — Progress Notes (Signed)
Ch received RRT from dialysis unit. Ch went to dialysis area and did not see the care team nor pt. Ch f/u w/ dialysis by calling the unit. Staff member informed ch that pt has been transferred to [facility]. Ch saw that pt was responsive in ED yet pt still had an elevated BP. Pt shared that he has had hear issues for years which is what led to his kidney problems. Ch was informed by the nurse that the pt was still waiting to be placed in a room. Ch provided an update for the pt on his status and that the nurse placed an order for him to receive a meal.    04/09/18 0700  Clinical Encounter Type  Visited With Patient  Visit Type Code  Referral From Nurse  Consult/Referral To Chaplain  Spiritual Encounters  Spiritual Needs Emotional;Grief support  Stress Factors  Patient Stress Factors Exhausted;Health changes;Loss of control;Major life changes  Family Stress Factors None identified

## 2018-04-09 NOTE — ED Notes (Signed)
Pt given breakfast tray

## 2018-04-09 NOTE — Progress Notes (Signed)
HD Tx end   04/09/18 0524  Vital Signs  Pulse Rate 71  Pulse Rate Source Monitor  Resp (!) 26  BP (!) 198/106  BP Location Right Arm  BP Method Automatic  Patient Position (if appropriate) Lying  Oxygen Therapy  SpO2 95 %  O2 Device Nasal Cannula  O2 Flow Rate (L/min) 2 L/min  During Hemodialysis Assessment  Blood Flow Rate (mL/min) 200 mL/min  Arterial Pressure (mmHg) -140 mmHg  Venous Pressure (mmHg) 290 mmHg  Transmembrane Pressure (mmHg) 80 mmHg  Ultrafiltration Rate (mL/min) 990 mL/min  Dialysate Flow Rate (mL/min) 800 ml/min  Conductivity: Machine  13.8  HD Safety Checks Performed Yes  Dialysis Fluid Bolus Normal Saline  Bolus Amount (mL) 250 mL  Intra-Hemodialysis Comments Tx completed (tx ended 13 mins early d/t high risk for chamber occlusion)  Post-Hemodialysis Assessment  Rinseback Volume (mL) 250 mL  KECN 74 V  Dialyzer Clearance Heavily streaked  Duration of HD Treatment -hour(s) 3.5 hour(s)  Hemodialysis Intake (mL) 550 mL  UF Total -Machine (mL) 2872 mL  Net UF (mL) 2322 mL  Tolerated HD Treatment Yes  Post-Hemodialysis Comments successful rinseback w NS d.t 13 min early tx term/no blood loss  Hemodialysis Catheter Right Subclavian Double-lumen  Placement Date/Time: (c) 02/10/17 1343   Placed prior to admission: Yes  Orientation: Right  Access Location: Subclavian  Hemodialysis Catheter Type: Double-lumen  Site Condition No complications  Blue Lumen Status Heparin locked;Capped (Central line)  Red Lumen Status Capped (Central line);Heparin locked  Purple Lumen Status N/A  Catheter fill solution Heparin 1000 units/ml  Catheter fill volume (Arterial) 1.8 cc  Catheter fill volume (Venous) 1.8  Dressing Type Occlusive;Other (Comment) (pt refuses dressing chnge after education on biopatch need)  Dressing Status Clean;Dry;Intact;Dressing changed  Interventions New dressing  Drainage Description None  Post treatment catheter status Capped and Clamped

## 2018-04-10 LAB — HEPATITIS B SURFACE ANTIBODY, QUANTITATIVE: Hep B S AB Quant (Post): <3.1 m[IU]/mL — ABNORMAL LOW (ref >9.9)

## 2018-04-10 LAB — HEPATITIS B SURFACE ANTIGEN: HEP B S AG: NEGATIVE

## 2018-04-10 MED ORDER — ONE-DAILY MULTI VITAMINS PO TABS: 1.0000 | ORAL_TABLET | Freq: Every day | ORAL | Status: DC

## 2018-04-10 MED ORDER — SODIUM ZIRCONIUM CYCLOSILICATE 10 G PO PACK
10.0000 g | PACK | Freq: Three times a day (TID) | ORAL | Status: DC
  Filled 2018-04-10 (×4): qty 1

## 2018-04-10 NOTE — Progress Notes (Signed)
04/10/2018 11:42 AM  Fernando Salas to be D/C'd Home per MD order.  Discussed prescriptions and follow up appointments with the patient. Prescriptions given to patient, medication list explained in detail. Pt verbalized understanding.  Allergies as of 04/10/2018      Reactions   Ibuprofen Other (See Comments)   Kidney    Viagra [sildenafil]    Lips swell    Clonidine Derivatives Rash      Medication List    TAKE these medications   amLODipine 10 MG tablet Commonly known as:  NORVASC Take 1 tablet (10 mg total) by mouth daily.   atorvastatin 40 MG tablet Commonly known as:  LIPITOR Take 1 tablet (40 mg total) by mouth daily.   calcium acetate 667 MG capsule Commonly known as:  PHOSLO Take 1,334 mg by mouth 3 (three) times daily with meals.   carvedilol 25 MG tablet Commonly known as:  COREG Take 1 tablet (25 mg total) by mouth 2 (two) times daily.   clopidogrel 75 MG tablet Commonly known as:  PLAVIX Take 1 tablet (75 mg total) by mouth daily.   hydrALAZINE 100 MG tablet Commonly known as:  APRESOLINE Take 1 tablet (100 mg total) by mouth 4 (four) times daily. What changed:  when to take this   isosorbide dinitrate 30 MG tablet Commonly known as:  ISORDIL Take 30 mg by mouth 3 (three) times daily.   multivitamin tablet Take 1 tablet by mouth daily.       Vitals:   04/09/18 1921 04/10/18 0519  BP: (!) 141/83 (!) 147/84  Pulse: 69 66  Resp: 20 16  Temp: 98.6 F (37 C) 98.4 F (36.9 C)  SpO2: 93% 95%    Skin clean, dry and intact without evidence of skin break down, no evidence of skin tears noted. IV catheter discontinued intact. Site without signs and symptoms of complications. Dressing and pressure applied. Pt denies pain at this time. No complaints noted.  An After Visit Summary was printed and given to the patient. Patient  D/C home via private auto.  Fernando Salas
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
## 2018-04-12 LAB — HIV ANTIBODY (ROUTINE TESTING W REFLEX): HIV Screen 4th Generation wRfx: NONREACTIVE

## 2018-04-12 NOTE — Discharge Summary (Signed)
[place] [facility] [place] NAME: Fernando Salas    MR#:  [id number]  DATE OF BIRTH:  1970/07/19  DATE OF ADMISSION:  04/08/2018 ADMITTING PHYSICIAN: Henreitta Leber, MD  DATE OF DISCHARGE: 04/10/2018 11:30 AM  PRIMARY CARE PHYSICIAN: System, Pcp Not In    ADMISSION DIAGNOSIS:  Hypertensive urgency, malignant [I16.0] Hypertensive urgency [I16.0]  DISCHARGE DIAGNOSIS:  Active Problems:   Hypertensive urgency   Hypertensive urgency, malignant   SECONDARY DIAGNOSIS:   Past Medical History:  Diagnosis Date  . CAD (coronary artery disease)    status post multiple myocardial infarctions  . COPD (chronic obstructive pulmonary disease) ([place])   . ESRD (end stage renal disease) ([place])   . Hyperlipidemia   . Hypertension   . Myocardial infarction ([place])    " I had 13 heart attacks, 8 massive"  . Proteinuria   . Renal insufficiency   . Secondary hyperparathyroidism of renal origin ([place])   . Shortness of breath dyspnea    " when I drink a cup of cold water too fast"  . Sleep apnea    pt stated " long story" does not wear CPAP  . Stroke [facility])     HOSPITAL COURSE:   MarioMooreis a48 y.o.malewith a known history of end-stage renal disease on Monday Wednesday Friday hemodialysis, hypertension, CAD status post prior stents, secondary hyperparathyroidism who has been discharged from [place] dialysis centers due to recurrent threats to dialysis staff has been presenting to local ED for his dialysis treatments. He presents today complaining of abdominal pain, emesis and shortness of breath.  1. Malignant/hypertensive urgency-secondary to noncompliance and patient missing his hemodialysis.  Initially when patient was admitted he was started on a nitroglycerin drip.  Patient was quickly weaned off the drip.  His oral medications including Coreg and Norvasc and hydralazine were resumed along with his Isordil. - Patient's blood pressures have improved after  initiation of his blood pressure meds and being started on dialysis.  He is clinically asymptomatic and therefore not being discharged home.  2. Acute pulmonary edema with hypoxic respiratory failure -Seen by nephrology underwent hemodialysis.  Shortness of breath and hypoxemia has improved.  No longer hypoxic and stable.   3. End-stage renal disease-on Monday Wednesday Friday dialysis schedule.  -Patient was noncompliant with his dialysis.  He was admitted to the hospital received emergent hemodialysis for fluid removal and has clinically improved.  He will resume his dialysis on a Monday Wednesday Friday schedule.  4. CAD-stable and no acute chest pain.  Pt. Will Continue carvedilol, Plavix, atorvastatin, Norvasc, Isordil.  5.  Secondary hyperparathyroidism- pt. Will continue PhosLo.  DISCHARGE CONDITIONS:   Stable.   CONSULTS OBTAINED:  Treatment Team:  Anthonette Legato, MD  DRUG ALLERGIES:   Allergies  Allergen Reactions  . Ibuprofen Other (See Comments)    Kidney   . Viagra [Sildenafil]     Lips swell   . Clonidine Derivatives Rash    DISCHARGE MEDICATIONS:   Allergies as of 04/10/2018      Reactions   Ibuprofen Other (See Comments)   Kidney    Viagra [sildenafil]    Lips swell    Clonidine Derivatives Rash      Medication List    TAKE these medications   amLODipine 10 MG tablet Commonly known as:  NORVASC Take 1 tablet (10 mg total) by mouth daily.   atorvastatin 40 MG tablet Commonly known as:  LIPITOR Take 1 tablet (40 mg total) by  mouth daily.   calcium acetate 667 MG capsule Commonly known as:  PHOSLO Take 1,334 mg by mouth 3 (three) times daily with meals.   carvedilol 25 MG tablet Commonly known as:  COREG Take 1 tablet (25 mg total) by mouth 2 (two) times daily.   clopidogrel 75 MG tablet Commonly known as:  PLAVIX Take 1 tablet (75 mg total) by mouth daily.   hydrALAZINE 100 MG tablet Commonly known as:  APRESOLINE Take 1 tablet  (100 mg total) by mouth 4 (four) times daily. What changed:  when to take this   isosorbide dinitrate 30 MG tablet Commonly known as:  ISORDIL Take 30 mg by mouth 3 (three) times daily.   multivitamin tablet Take 1 tablet by mouth daily.         DISCHARGE INSTRUCTIONS:   DIET:  Cardiac diet and Renal diet  DISCHARGE CONDITION:  Stable  ACTIVITY:  Activity as tolerated  OXYGEN:  Home Oxygen: No.   Oxygen Delivery: room air  DISCHARGE LOCATION:  home   If you experience worsening of your admission symptoms, develop shortness of breath, life threatening emergency, suicidal or homicidal thoughts you must seek medical attention immediately by calling 911 or calling your MD immediately  if symptoms less severe.  You Must read complete instructions/literature along with all the possible adverse reactions/side effects for all the Medicines you take and that have been prescribed to you. Take any new Medicines after you have completely understood and accpet all the possible adverse reactions/side effects.   Please note  You were cared for by a hospitalist during your hospital stay. If you have any questions about your discharge medications or the care you received while you were in the hospital after you are discharged, you can call the unit and asked to speak with the hospitalist on call if the hospitalist that took care of you is not available. Once you are discharged, your primary care physician will handle any further medical issues. Please note that NO REFILLS for any discharge medications will be authorized once you are discharged, as it is imperative that you return to your primary care physician (or establish a relationship with a primary care physician if you do not have one) for your aftercare needs so that they can reassess your need for medications and monitor your lab values.     Today   No acute events overnight.  Shortness of breath is improved.  Denies any chest  pains.  Will discharge home today after hemodialysis.  VITAL SIGNS:  Blood pressure (!) 147/84, pulse 66, temperature 98.4 F (36.9 C), temperature source Oral, resp. rate 16, height 6' (1.829 m), weight 98.9 kg, SpO2 95 %.  I/O:  No intake or output data in the 24 hours ending 04/12/18 1440  PHYSICAL EXAMINATION:   GENERAL:  48 y.o.-year-old patient lying in bed in no acute distress.  EYES: Pupils equal, round, reactive to light and accommodation. No scleral icterus. Extraocular muscles intact.  HEENT: Head atraumatic, normocephalic. Oropharynx and nasopharynx clear.  NECK:  Supple, no jugular venous distention. No thyroid enlargement, no tenderness.  LUNGS: Normal breath sounds bilaterally, no wheezing, rales, rhonchi. No use of accessory muscles of respiration.  CARDIOVASCULAR: S1, S2 normal. No murmurs, rubs, or gallops.  ABDOMEN: Soft, nontender, nondistended. Bowel sounds present. No organomegaly or mass.  EXTREMITIES: No cyanosis, clubbing or edema b/l.    NEUROLOGIC: Cranial nerves II through XII are intact. No focal Motor or sensory deficits b/l.   PSYCHIATRIC:  The patient is alert and oriented x 3.  SKIN: No obvious rash, lesion, or ulcer.   Right chest wall Perm-cath in place.   DATA REVIEW:   CBC Recent Labs  Lab 04/09/18 1603  WBC 6.6  HGB 9.7*  HCT 29.6*  PLT 182    Chemistries  Recent Labs  Lab 04/09/18 1603  NA 137  K 4.0  CL 99  CO2 27  GLUCOSE 110*  BUN 60*  CREATININE 15.28*  CALCIUM 7.8*    Cardiac Enzymes Recent Labs  Lab 04/09/18 2330  TROPONINI 0.14*    Microbiology Results  Results for orders placed or performed during the hospital encounter of 04/08/18  MRSA PCR Screening     Status: None   Collection Time: 04/09/18  4:03 PM  Result Value Ref Range Status   MRSA by PCR NEGATIVE NEGATIVE Final    Comment:        The GeneXpert MRSA Assay (FDA approved for NASAL specimens only), is one component of a comprehensive MRSA  colonization surveillance program. It is not intended to diagnose MRSA infection nor to guide or monitor treatment for MRSA infections. Performed at [facility], [address], [place], [place] [address]     RADIOLOGY:  No results found.    Management plans discussed with the patient, family and they are in agreement.  CODE STATUS:  Code Status History    Date Active Date Inactive Code Status Order ID Comments User Context   04/09/2018 1552 04/10/2018 1449 Full Code [id number]  Gladstone Lighter, MD Inpatient    TOTAL TIME TAKING CARE OF THIS PATIENT: 40 minutes.    Henreitta Leber M.D on 04/12/2018 at 2:40 PM  Between 7am to 6pm - Pager - [phone]  After 6pm go to [email or web address] - Proofreader  [facility]  Office  [phone]  CC: Primary care physician; System, Pcp Not In

## 2018-04-13 ENCOUNTER — Telehealth: Payer: Self-pay

## 2018-04-13 NOTE — Telephone Encounter (Signed)
Flagged on EMMI report for the following:  - Got discharge papers: No - Know who to call about changes in condition: No - Unfilled prescriptions: Yes - Able to fill today/tomorrow: No  First attempt to reach made, however unable to reach patient.  Unable to leave message due to voicemail not set up.  Will attempt at later time.

## 2018-04-26 DIAGNOSIS — J101 Influenza due to other identified influenza virus with other respiratory manifestations: Secondary | ICD-10-CM | POA: Insufficient documentation

## 2018-05-23 ENCOUNTER — Emergency Department: Payer: Medicare Other

## 2018-05-23 ENCOUNTER — Non-Acute Institutional Stay
Admission: EM | Admit: 2018-05-23 | Discharge: 2018-05-23 | Disposition: A | Discharge status: Home / Self Care | Payer: Medicare Other | Attending: Emergency Medicine | Admitting: Emergency Medicine

## 2018-05-23 ENCOUNTER — Other Ambulatory Visit: Payer: Self-pay

## 2018-05-23 ENCOUNTER — Encounter: Payer: Self-pay | Admitting: Emergency Medicine

## 2018-05-23 DIAGNOSIS — I120 Hypertensive chronic kidney disease with stage 5 chronic kidney disease or end stage renal disease: Secondary | ICD-10-CM | POA: Insufficient documentation

## 2018-05-23 DIAGNOSIS — Z992 Dependence on renal dialysis: Secondary | ICD-10-CM | POA: Insufficient documentation

## 2018-05-23 DIAGNOSIS — F17200 Nicotine dependence, unspecified, uncomplicated: Secondary | ICD-10-CM | POA: Diagnosis not present

## 2018-05-23 DIAGNOSIS — N2581 Secondary hyperparathyroidism of renal origin: Secondary | ICD-10-CM | POA: Diagnosis not present

## 2018-05-23 DIAGNOSIS — N186 End stage renal disease: Secondary | ICD-10-CM | POA: Diagnosis not present

## 2018-05-23 DIAGNOSIS — R0602 Shortness of breath: Secondary | ICD-10-CM | POA: Insufficient documentation

## 2018-05-23 DIAGNOSIS — J449 Chronic obstructive pulmonary disease, unspecified: Secondary | ICD-10-CM | POA: Insufficient documentation

## 2018-05-23 DIAGNOSIS — R52 Pain, unspecified: Secondary | ICD-10-CM

## 2018-05-23 DIAGNOSIS — Z9115 Patient's noncompliance with renal dialysis: Secondary | ICD-10-CM | POA: Insufficient documentation

## 2018-05-23 DIAGNOSIS — I252 Old myocardial infarction: Secondary | ICD-10-CM | POA: Insufficient documentation

## 2018-05-23 DIAGNOSIS — I2510 Atherosclerotic heart disease of native coronary artery without angina pectoris: Secondary | ICD-10-CM | POA: Diagnosis not present

## 2018-05-23 DIAGNOSIS — D631 Anemia in chronic kidney disease: Secondary | ICD-10-CM | POA: Diagnosis not present

## 2018-05-23 DIAGNOSIS — N189 Chronic kidney disease, unspecified: Secondary | ICD-10-CM

## 2018-05-23 LAB — CBC
HCT: 28.1 % — ABNORMAL LOW (ref 39.0–52.0)
Hemoglobin: 9 g/dL — ABNORMAL LOW (ref 13.0–17.0)
MCH: 30.9 pg (ref 26.0–34.0)
MCHC: 32 g/dL (ref 30.0–36.0)
MCV: 96.6 fL (ref 80.0–100.0)
Platelets: 210 10*3/uL (ref 150–400)
RBC: 2.91 MIL/uL — ABNORMAL LOW (ref 4.22–5.81)
RDW: 15.1 % (ref 11.5–15.5)
WBC: 6.8 10*3/uL (ref 4.0–10.5)
nRBC: 0 % (ref 0.0–0.2)

## 2018-05-23 LAB — BASIC METABOLIC PANEL
Anion gap: 13 (ref 5–15)
BUN: 62 mg/dL — ABNORMAL HIGH (ref 6–20)
CHLORIDE: 107 mmol/L (ref 98–111)
CO2: 19 mmol/L — ABNORMAL LOW (ref 22–32)
CREATININE: 13.09 mg/dL — AB (ref 0.61–1.24)
Calcium: 8.4 mg/dL — ABNORMAL LOW (ref 8.9–10.3)
GFR calc Af Amer: 5 mL/min — ABNORMAL LOW (ref >60)
GFR calc non Af Amer: 4 mL/min — ABNORMAL LOW (ref >60)
Glucose, Bld: 93 mg/dL (ref 70–99)
Potassium: 4.9 mmol/L (ref 3.5–5.1)
Sodium: 139 mmol/L (ref 135–145)

## 2018-05-23 LAB — BRAIN NATRIURETIC PEPTIDE

## 2018-05-23 LAB — TROPONIN I: Troponin I: 0.07 ng/mL (ref <0.03)

## 2018-05-23 MED ORDER — EPOETIN ALFA 10000 UNIT/ML IJ SOLN
10000.0000 [IU] | Freq: Once | INTRAMUSCULAR | Status: AC
  Administered 2018-05-23: 10000 [IU] via INTRAVENOUS

## 2018-05-23 MED ORDER — CALCIUM ACETATE (PHOS BINDER) 667 MG PO CAPS
2001.0000 mg | ORAL_CAPSULE | Freq: Three times a day (TID) | ORAL | Status: DC
  Filled 2018-05-23 (×2): qty 3

## 2018-05-23 MED ORDER — CHLORHEXIDINE GLUCONATE CLOTH 2 % EX PADS
6.0000 | MEDICATED_PAD | Freq: Every day | CUTANEOUS | Status: DC
  Filled 2018-05-23: qty 6

## 2018-05-23 NOTE — Progress Notes (Signed)
Post HD Tx    05/23/18 1933  Hand-Off documentation  Report given to (Full Name) Alwyn Ren, RN   Report received from (Full Name) Beatris Ship, RN   Vital Signs  Temp 98.7 F (37.1 C)  Temp Source Oral  Pulse Rate 73  Pulse Rate Source Monitor  Resp (!) 23  BP (!) 168/103  BP Location Right Arm  BP Method Automatic  Patient Position (if appropriate) Sitting  Oxygen Therapy  SpO2 97 %  O2 Device Room Air

## 2018-05-23 NOTE — ED Notes (Signed)
Date and time results received: 05/23/18 2:31 PM  Test: Troponin I  Critical Value: 0.07  Name of Provider Notified: Dr. Burlene Arnt  Orders Received? Or Actions Taken?: no new orders at this time

## 2018-05-23 NOTE — ED Provider Notes (Addendum)
[facility] Emergency Department Provider Note  ____________________________________________   I have reviewed the triage vital signs and the nursing notes. Where available I have reviewed prior notes and, if possible and indicated, outside hospital notes.    HISTORY  Chief Complaint Shortness of Breath    HPI Fernando Salas is a 48 y.o. male  Is a gentleman who is on Monday Wednesday Friday dialysis, history of COPD history of ACS, presents today complaining of shortness of breath.  Patient states that he had some dietary indiscretions over the weekend which is caused him to feel short of breath.  He states this is not uncommon for him.  He states that he was on his way to dialysis but he elected to stop here instead because he was feeling short of breath.  He denies any cough, fever, he does not have any chest pain.  He states he has usual shortness of breath that he gets when he has missed dialysis but he did not miss dialysis.  He states sometimes this can be brought about by eating too much salty food. Patient states that he became somewhat short of breath this morning and he knew it was coming on last night.   Past Medical History:  Diagnosis Date  . CAD (coronary artery disease)    status post multiple myocardial infarctions  . COPD (chronic obstructive pulmonary disease) ([place])   . ESRD (end stage renal disease) ([place])   . Hyperlipidemia   . Hypertension   . Myocardial infarction ([place])    " I had 13 heart attacks, 8 massive"  . Proteinuria   . Renal insufficiency   . Secondary hyperparathyroidism of renal origin ([place])   . Shortness of breath dyspnea    " when I drink a cup of cold water too fast"  . Sleep apnea    pt stated " long story" does not wear CPAP  . Stroke [facility])     Patient Active Problem List   Diagnosis Date Noted  . Hypertensive urgency, malignant 04/08/2018  . Hypertensive urgency 03/25/2018  . Pulmonary edema 03/09/2017  . Adult  antisocial behavior 02/09/2017  . Fluid overload 02/08/2017  . Accelerated hypertension 01/12/2017  . AKI (acute kidney injury) ([place])   . Unstable angina ([place]) 11/01/2016    Past Surgical History:  Procedure Laterality Date  . AV FISTULA PLACEMENT Left 01-24-14   By Dr. Hortencia Pilar in [place]  . AV FISTULA PLACEMENT Right 04/30/2014   Procedure: RIGHT BRACHIOCEPHALIC ARTERIOVENOUS (AV) FISTULA CREATION;  Surgeon: Elam Dutch, MD;  Location: [facility] OR;  Service: Vascular;  Laterality: Right;  . AV FISTULA PLACEMENT Left 05/31/2014   Procedure: Left INSERTION OF ARTERIOVENOUS (AV) GORE-TEX GRAFT ARM;  Surgeon: Elam Dutch, MD;  Location: [facility];  Service: Vascular;  Laterality: Left;  . CARDIAC CATHETERIZATION    . CORONARY STENT PLACEMENT     13 stents total  . DIALYSIS/PERMA CATHETER INSERTION N/A 01/15/2017   Procedure: DIALYSIS/PERMA CATHETER INSERTION;  Surgeon: Katha Cabal, MD;  Location: [facility] CV LAB;  Service: Cardiovascular;  Laterality: N/A;    Prior to Admission medications   Medication Sig Start Date End Date Taking? Authorizing Provider  amLODipine (NORVASC) 10 MG tablet Take 1 tablet (10 mg total) by mouth daily. 05/01/17 05/01/18  Lisa Roca, MD  atorvastatin (LIPITOR) 40 MG tablet Take 1 tablet (40 mg total) by mouth daily. 11/03/16   Demetrios Loll, MD  calcium acetate (PHOSLO) 667 MG capsule Take  1,334 mg by mouth 3 (three) times daily with meals. 12/03/17 12/03/18  [provider]  carvedilol (COREG) 25 MG tablet Take 1 tablet (25 mg total) by mouth 2 (two) times daily. 05/01/17 05/01/18  Lisa Roca, MD  clopidogrel (PLAVIX) 75 MG tablet Take 1 tablet (75 mg total) by mouth daily. 11/03/16   Demetrios Loll, MD  hydrALAZINE (APRESOLINE) 100 MG tablet Take 1 tablet (100 mg total) by mouth 4 (four) times daily. Patient taking differently: Take 100 mg by mouth every 8 (eight) hours.  05/01/17 05/01/18  Lisa Roca, MD  isosorbide dinitrate (ISORDIL) 30 MG  tablet Take 30 mg by mouth 3 (three) times daily.     [provider]  Multiple Vitamin (MULTIVITAMIN) tablet Take 1 tablet by mouth daily. 04/10/18   Henreitta Leber, MD    Allergies Ibuprofen; Viagra [sildenafil]; and Clonidine derivatives  Family History  Problem Relation Age of Onset  . Heart disease Mother   . Hyperlipidemia Mother   . Hypertension Mother   . Kidney disease Father   . Kidney disease Brother   . Hyperlipidemia Sister   . Hyperlipidemia Daughter   . Hypertension Sister   . Hypertension Daughter     Social History Social History   Tobacco Use  . Smoking status: Current Every Day Smoker    Packs/day: 0.25    Years: 25.00    Pack years: 6.25    Types: Cigarettes  . Smokeless tobacco: Never Used  Substance Use Topics  . Alcohol use: No    Alcohol/week: 0.0 standard drinks    Frequency: Never    Comment: occasional  . Drug use: Not Currently    Types: Marijuana    Comment: daily    Review of Systems Constitutional: No fever/chills Eyes: No visual changes. ENT: No sore throat. No stiff neck no neck pain Cardiovascular: Denies chest pain. Respiratory: + shortness of breath. Gastrointestinal:   no vomiting.  No diarrhea.  No constipation. Genitourinary: Negative for dysuria. Musculoskeletal: Negative lower extremity swelling Skin: Negative for rash. Neurological: Negative for severe headaches, focal weakness or numbness.   ____________________________________________   PHYSICAL EXAM:  VITAL SIGNS: ED Triage Vitals [05/23/18 1335]  Enc Vitals Group     BP      Pulse Rate 74     Resp (!) 26     Temp 98.1 F (36.7 C)     Temp Source Oral     SpO2 95 %     Weight 225 lb (102.1 kg)     Height 6' (1.829 m)     Head Circumference      Peak Flow      Pain Score 0     Pain Loc      Pain Edu?      Excl. in [place]?     Constitutional: Alert and oriented. Well appearing and in no acute distress. Eyes: Conjunctivae are normal Head:  Atraumatic HEENT: No congestion/rhinnorhea. Mucous membranes are moist.  Oropharynx non-erythematous Neck:   Nontender with no meningismus, no masses, no stridor Cardiovascular: Normal rate, regular rhythm. Grossly normal heart sounds.  Good peripheral circulation. Respiratory: Normal respiratory effort.  No retractions.  Slightly diminished in the bases, respiratory rate slightly elevated Abdominal: Soft and nontender. No distention. No guarding no rebound Back:  There is no focal tenderness or step off.  there is no midline tenderness there are no lesions noted. there is no CVA tenderness Musculoskeletal: No lower extremity tenderness, no upper extremity tenderness. No  joint effusions, no DVT signs strong distal pulses no edema Neurologic:  Normal speech and language. No gross focal neurologic deficits are appreciated.  Skin:  Skin is warm, dry and intact. No rash noted. Psychiatric: Mood and affect are normal. Speech and behavior are normal.  ____________________________________________   LABS (all labs ordered are listed, but only abnormal results are displayed)  Labs Reviewed  CBC - Abnormal; Notable for the following components:      Result Value   RBC 2.91 (*)    Hemoglobin 9.0 (*)    HCT 28.1 (*)    All other components within normal limits  BASIC METABOLIC PANEL  TROPONIN I    Pertinent labs  results that were available during my care of the patient were reviewed by me and considered in my medical decision making (see chart for details). ____________________________________________  EKG  I personally interpreted any EKGs ordered by me or triage Sinus rhythm rate 72 bpm no acute ST elevation or depression nonspecific ST changes, borderline LAD ____________________________________________  RADIOLOGY  Pertinent labs & imaging results that were available during my care of the patient were reviewed by me and considered in my medical decision making (see chart for details).  If possible, patient and/or family made aware of any abnormal findings.  Dg Chest Port 1 View  Result Date: 05/23/2018 CLINICAL DATA:  Shortness of breath, end-stage renal disease on dialysis, COPD, coronary artery disease post MI, accelerated hypertension, smoker EXAM: PORTABLE CHEST 1 VIEW COMPARISON:  Portable exam 1400 hours compared to 04/08/2018 FINDINGS: RIGHT jugular dual-lumen central venous catheter with tip projecting over RIGHT atrium. Enlargement of cardiac silhouette with pulmonary vascular congestion. Minimal pulmonary edema, slightly improved from previous exam. No segmental consolidation, pleural effusion or pneumothorax. IMPRESSION: Enlargement of cardiac silhouette with pulmonary vascular congestion and minimal pulmonary edema. Electronically Signed   By: Lavonia Dana M.D.   On: 05/23/2018 14:15   ____________________________________________    PROCEDURES  Procedure(s) performed: None  Procedures  Critical Care performed: None  ____________________________________________   INITIAL IMPRESSION / ASSESSMENT AND PLAN / ED COURSE  Pertinent labs & imaging results that were available during my care of the patient were reviewed by me and considered in my medical decision making (see chart for details).  Patient who apparently gets his dialysis to the emergency department at [facility] came to the emergency department here because that he thought he needed dialysis faster than the 20-minute delay they would take him to get to [facility] from here.  However, review of notes from [facility] suggest he was there for dialysis already today.  In fact it appears that he may have come straight from there to here.  Notes suggest that he left AGAINST MEDICAL ADVICE.  We will discuss with him why he decided to leave [facility] and come here.  ----------------------------------------- 2:34 PM on 05/23/2018 ----------------------------------------- Patient states that he was asked at [facility] to defer his dialysis till  tomorrow because they were overloaded and so he left because he could not wait another day because of shortness of breath.  This seems to be somewhat divergent from the notes at least.  Although obviously this is the time of global pandemic so it is very difficult to know exactly what the availability is for dialysis there.    ____________________________________________   FINAL CLINICAL IMPRESSION(S) / ED DIAGNOSES  Final diagnoses:  Pain      This chart was dictated using voice recognition software.  Despite best efforts to proofread,  errors  can occur which can change meaning.      Schuyler Amor, MD 05/23/18 1431    Schuyler Amor, MD 05/23/18 1435

## 2018-05-23 NOTE — Progress Notes (Signed)
Pre HD assessment    05/23/18 1548  Neurological  Level of Consciousness Alert  Orientation Level Oriented X4  Respiratory  Respiratory Pattern Regular;Unlabored  Chest Assessment Chest expansion symmetrical  Bilateral Breath Sounds Diminished  Cough None  Cardiac  Pulse Regular  Heart Sounds S1, S2  ECG Monitor Yes  Cardiac Rhythm NSR  Vascular  R Radial Pulse +2  L Radial Pulse +2  Psychosocial  Psychosocial (WDL) WDL

## 2018-05-23 NOTE — ED Notes (Signed)

## 2018-05-23 NOTE — ED Triage Notes (Signed)
Here for [facility]. Pt is dialysis pt and received on Friday. Was on way to [facility] to get dialysis and reports felt too [facility] and could not make it.  Unlabored but mild tachypnea noted in triage.  VSS. No fever or cough. No travel. No pain.

## 2018-05-23 NOTE — Progress Notes (Signed)
HD Tx completed tolerated well   05/23/18 1930  Vital Signs  Pulse Rate 75  Pulse Rate Source Monitor  Resp 18  BP (!) 169/105  BP Location Right Arm  BP Method Automatic  Patient Position (if appropriate) Sitting  Oxygen Therapy  SpO2 97 %  O2 Device Room Air  During Hemodialysis Assessment  HD Safety Checks Performed Yes  KECN 76.2 KECN  Dialysis Fluid Bolus Normal Saline  Bolus Amount (mL) 250 mL  Intra-Hemodialysis Comments Tx completed;Tolerated well

## 2018-05-23 NOTE — Progress Notes (Signed)
[facility]  ROUNDING NOTE   Subjective:   Fernando Salas did not get his scheduled hemodialysis treatment today. Patient presents with shortness of breath asking for dialysis treatment.   Objective:  Vital signs in last 24 hours:  Temp:  [98.1 F (36.7 C)] 98.1 F (36.7 C) (03/23 1335) Pulse Rate:  [74] 74 (03/23 1335) Resp:  [26] 26 (03/23 1335) SpO2:  [95 %] 95 % (03/23 1335) Weight:  [102.1 kg] 102.1 kg (03/23 1335)  Weight change:  Filed Weights   05/23/18 1335  Weight: 102.1 kg    Intake/Output: No intake/output data recorded.   Intake/Output this shift:  No intake/output data recorded.  Physical Exam: General: NAD,   Head: Normocephalic, atraumatic. Moist oral mucosal membranes  Eyes: Anicteric, PERRL  Neck: Supple, trachea midline  Lungs:  Clear to auscultation  Heart: Regular rate and rhythm  Abdomen:  Soft, nontender,   Extremities:  no peripheral edema.  Neurologic: Nonfocal, moving all four extremities  Skin: No lesions  Access: RIJ permcath    Basic Metabolic Panel: Recent Labs  Lab 05/23/18 1351  NA 139  K 4.9  CL 107  CO2 19*  GLUCOSE 93  BUN 62*  CREATININE 13.09*  CALCIUM 8.4*    Liver Function Tests: No results for input(s): AST, ALT, ALKPHOS, BILITOT, PROT, ALBUMIN in the last 168 hours. No results for input(s): LIPASE, AMYLASE in the last 168 hours. No results for input(s): AMMONIA in the last 168 hours.  CBC: Recent Labs  Lab 05/23/18 1351  WBC 6.8  HGB 9.0*  HCT 28.1*  MCV 96.6  PLT 210    Cardiac Enzymes: Recent Labs  Lab 05/23/18 1351  TROPONINI 0.07*    BNP: Invalid input(s): POCBNP  CBG: No results for input(s): GLUCAP in the last 168 hours.  Microbiology: Results for orders placed or performed during the hospital encounter of 04/08/18  MRSA PCR Screening     Status: None   Collection Time: 04/09/18  4:03 PM  Result Value Ref Range Status   MRSA by PCR NEGATIVE NEGATIVE Final   Comment:        The GeneXpert MRSA Assay (FDA approved for NASAL specimens only), is one component of a comprehensive MRSA colonization surveillance program. It is not intended to diagnose MRSA infection nor to guide or monitor treatment for MRSA infections. Performed at [facility], [address] [place], [place] [id number]     Coagulation Studies: No results for input(s): LABPROT, INR in the last 72 hours.  Urinalysis: No results for input(s): COLORURINE, LABSPEC, PHURINE, GLUCOSEU, HGBUR, BILIRUBINUR, KETONESUR, PROTEINUR, UROBILINOGEN, NITRITE, LEUKOCYTESUR in the last 72 hours.  Invalid input(s): APPERANCEUR    Imaging: Dg Chest Port 1 View  Result Date: 05/23/2018 CLINICAL DATA:  Shortness of breath, end-stage renal disease on dialysis, COPD, coronary artery disease post MI, accelerated hypertension, smoker EXAM: PORTABLE CHEST 1 VIEW COMPARISON:  Portable exam 1400 hours compared to 04/08/2018 FINDINGS: RIGHT jugular dual-lumen central venous catheter with tip projecting over RIGHT atrium. Enlargement of cardiac silhouette with pulmonary vascular congestion. Minimal pulmonary edema, slightly improved from previous exam. No segmental consolidation, pleural effusion or pneumothorax. IMPRESSION: Enlargement of cardiac silhouette with pulmonary vascular congestion and minimal pulmonary edema. Electronically Signed   By: Lavonia Dana M.D.   On: 05/23/2018 14:15     Medications:    . [START ON 05/24/2018] Chlorhexidine Gluconate Cloth  6 each Topical Q0600     Assessment/ Plan:  Mr. Brad  D Salas is a 48 y.o. black male with end stage renal disease on hemodialysis, hypertension, CVA, sleep apnea, coronary artery disease, COPD, anti-social personality disorder.   1. End Stage Renal Disease: currently without an outpatient unit. Usually gets treatment at [facility].  - Hemodialysis for today. UF goal of 3.5 liters.   2. Hypertension: noncompliant with home  regimen Hold medications before dialysis.   3. Anemia of chronic kidney disease: hemoglobin 9 - EPO with HD treatment.   4. Secondary Hyperparathyroidism:   - calcium acetate with meals.    LOS: 0 Venisa Frampton 3/23/20202:50 PM

## 2018-05-23 NOTE — Progress Notes (Signed)
Pre HD Tx    05/23/18 1550  Vital Signs  Temp 98.7 F (37.1 C)  Temp Source Oral  Pulse Rate 72  Pulse Rate Source Monitor  Resp 18  BP (!) 177/98  BP Location Right Arm  BP Method Automatic  Patient Position (if appropriate) Sitting  Oxygen Therapy  SpO2 99 %  O2 Device Room Air  Pulse Oximetry Type Continuous  Pain Assessment  Pain Scale 0-10  Pain Score 0  Dialysis Weight  Weight  (102.9 kg)  Type of Weight Pre-Dialysis  Time-Out for Hemodialysis  What Procedure? HD   Pt Identifiers(min of two) First/Last Name  Correct Site? Yes  Correct Side? Yes  Correct Procedure? Yes  Consents Verified? Yes  Rad Studies Available? N/A  Safety Precautions Reviewed? Yes  Engineer, civil (consulting) Number 5  Station Number 3  UF/Alarm Test Passed  Conductivity: Meter 14  Conductivity: Machine  13.9  pH 7.4  Reverse Osmosis Main  Normal Saline Lot Number Y  Dialyzer Lot Number [id number]  Disposable Set Lot Number [id number]  Machine Temperature 98.6 F (37 C)  Musician and Audible Yes  Blood Lines Intact and Secured Yes  Pre Treatment Patient Checks  Vascular access used during treatment Catheter  Patient is receiving dialysis in a chair Yes  Hepatitis B Surface Antigen Results  (unk)  Isolation Initiated Yes  Hemodialysis Consent Verified Yes  Hemodialysis Standing Orders Initiated Yes  ECG (Telemetry) Monitor On Yes  Prime Ordered Normal Saline  Length of  DialysisTreatment -hour(s) 3.5 Hour(s)  Dialysis Treatment Comments NA 140  Dialyzer Elisio 17H NR  Dialysate 3K, 2.5 Ca  Dialysis Anticoagulant None  Dialysate Flow Ordered 800  Blood Flow Rate Ordered 400 mL/min  Ultrafiltration Goal 3.5 Liters (Pt states per EDW only has 2.5 MD made aware)  Dialysis Blood Pressure Support Ordered Normal Saline  Education / Care Plan  Dialysis Education Provided Yes  Documented Education in Care Plan Yes

## 2018-05-23 NOTE — Progress Notes (Signed)
HD Tx started w/o complication    92/42/68 1600  Vital Signs  Pulse Rate 72  Resp 19  BP (!) 179/110  During Hemodialysis Assessment  Blood Flow Rate (mL/min) 400 mL/min  Arterial Pressure (mmHg) -170 mmHg  Venous Pressure (mmHg) 130 mmHg  Transmembrane Pressure (mmHg) 60 mmHg  Ultrafiltration Rate (mL/min) 760 mL/min  Dialysate Flow Rate (mL/min) 800 ml/min  Conductivity: Machine  13.5  HD Safety Checks Performed Yes  Dialysis Fluid Bolus Normal Saline  Bolus Amount (mL) 250 mL  Intra-Hemodialysis Comments Tx initiated

## 2018-05-23 NOTE — Progress Notes (Signed)
Post HD Assessment    05/23/18 1935  Neurological  Level of Consciousness Alert  Orientation Level Oriented X4  Respiratory  Respiratory Pattern Regular;Unlabored  Chest Assessment Chest expansion symmetrical  Bilateral Breath Sounds Clear;Diminished  Cough None  Cardiac  Pulse Regular  Heart Sounds S1, S2  ECG Monitor Yes  Cardiac Rhythm NSR  Vascular  R Radial Pulse +2  L Radial Pulse +2  Edema Generalized  Generalized Edema +1  Psychosocial  Psychosocial (WDL) WDL

## 2018-06-04 ENCOUNTER — Other Ambulatory Visit: Payer: Self-pay

## 2018-06-04 ENCOUNTER — Inpatient Hospital Stay
Admission: EM | Admit: 2018-06-04 | Discharge: 2018-06-04 | DRG: 682 | Disposition: A | Discharge status: Home / Self Care | Payer: Medicare Other | Attending: Internal Medicine | Admitting: Internal Medicine

## 2018-06-04 ENCOUNTER — Emergency Department: Payer: Medicare Other

## 2018-06-04 DIAGNOSIS — N186 End stage renal disease: Secondary | ICD-10-CM | POA: Diagnosis present

## 2018-06-04 DIAGNOSIS — J449 Chronic obstructive pulmonary disease, unspecified: Secondary | ICD-10-CM | POA: Diagnosis present

## 2018-06-04 DIAGNOSIS — I120 Hypertensive chronic kidney disease with stage 5 chronic kidney disease or end stage renal disease: Secondary | ICD-10-CM | POA: Diagnosis present

## 2018-06-04 DIAGNOSIS — E785 Hyperlipidemia, unspecified: Secondary | ICD-10-CM | POA: Diagnosis present

## 2018-06-04 DIAGNOSIS — I160 Hypertensive urgency: Secondary | ICD-10-CM | POA: Diagnosis present

## 2018-06-04 DIAGNOSIS — Z8673 Personal history of transient ischemic attack (TIA), and cerebral infarction without residual deficits: Secondary | ICD-10-CM

## 2018-06-04 DIAGNOSIS — I2510 Atherosclerotic heart disease of native coronary artery without angina pectoris: Secondary | ICD-10-CM | POA: Diagnosis present

## 2018-06-04 DIAGNOSIS — R0602 Shortness of breath: Secondary | ICD-10-CM

## 2018-06-04 DIAGNOSIS — G4730 Sleep apnea, unspecified: Secondary | ICD-10-CM | POA: Diagnosis present

## 2018-06-04 DIAGNOSIS — I252 Old myocardial infarction: Secondary | ICD-10-CM | POA: Diagnosis not present

## 2018-06-04 DIAGNOSIS — D631 Anemia in chronic kidney disease: Secondary | ICD-10-CM | POA: Diagnosis present

## 2018-06-04 DIAGNOSIS — J810 Acute pulmonary edema: Secondary | ICD-10-CM | POA: Diagnosis present

## 2018-06-04 DIAGNOSIS — Z7902 Long term (current) use of antithrombotics/antiplatelets: Secondary | ICD-10-CM | POA: Diagnosis not present

## 2018-06-04 DIAGNOSIS — Z955 Presence of coronary angioplasty implant and graft: Secondary | ICD-10-CM

## 2018-06-04 DIAGNOSIS — F17210 Nicotine dependence, cigarettes, uncomplicated: Secondary | ICD-10-CM | POA: Diagnosis present

## 2018-06-04 DIAGNOSIS — E8770 Fluid overload, unspecified: Secondary | ICD-10-CM | POA: Diagnosis present

## 2018-06-04 DIAGNOSIS — Z841 Family history of disorders of kidney and ureter: Secondary | ICD-10-CM

## 2018-06-04 DIAGNOSIS — Z992 Dependence on renal dialysis: Secondary | ICD-10-CM | POA: Diagnosis not present

## 2018-06-04 DIAGNOSIS — J811 Chronic pulmonary edema: Secondary | ICD-10-CM | POA: Diagnosis present

## 2018-06-04 DIAGNOSIS — F602 Antisocial personality disorder: Secondary | ICD-10-CM | POA: Diagnosis present

## 2018-06-04 DIAGNOSIS — Z888 Allergy status to other drugs, medicaments and biological substances status: Secondary | ICD-10-CM | POA: Diagnosis not present

## 2018-06-04 DIAGNOSIS — N2581 Secondary hyperparathyroidism of renal origin: Secondary | ICD-10-CM | POA: Diagnosis present

## 2018-06-04 DIAGNOSIS — Z8249 Family history of ischemic heart disease and other diseases of the circulatory system: Secondary | ICD-10-CM | POA: Diagnosis not present

## 2018-06-04 DIAGNOSIS — Z79899 Other long term (current) drug therapy: Secondary | ICD-10-CM | POA: Diagnosis not present

## 2018-06-04 DIAGNOSIS — E875 Hyperkalemia: Secondary | ICD-10-CM | POA: Diagnosis present

## 2018-06-04 LAB — BASIC METABOLIC PANEL
Anion gap: 17 — ABNORMAL HIGH (ref 5–15)
BUN: 65 mg/dL — ABNORMAL HIGH (ref 6–20)
CO2: 17 mmol/L — ABNORMAL LOW (ref 22–32)
Calcium: 8.8 mg/dL — ABNORMAL LOW (ref 8.9–10.3)
Chloride: 106 mmol/L (ref 98–111)
Creatinine, Ser: 14.96 mg/dL — ABNORMAL HIGH (ref 0.61–1.24)
GFR calc Af Amer: 4 mL/min — ABNORMAL LOW (ref >60)
GFR calc non Af Amer: 3 mL/min — ABNORMAL LOW (ref >60)
Glucose, Bld: 87 mg/dL (ref 70–99)
Potassium: 6 mmol/L — ABNORMAL HIGH (ref 3.5–5.1)
Sodium: 140 mmol/L (ref 135–145)

## 2018-06-04 LAB — CBC
HCT: 33.9 % — ABNORMAL LOW (ref 39.0–52.0)
Hemoglobin: 10 g/dL — ABNORMAL LOW (ref 13.0–17.0)
MCH: 30.9 pg (ref 26.0–34.0)
MCHC: 29.5 g/dL — ABNORMAL LOW (ref 30.0–36.0)
MCV: 104.6 fL — ABNORMAL HIGH (ref 80.0–100.0)
Platelets: 171 10*3/uL (ref 150–400)
RBC: 3.24 MIL/uL — ABNORMAL LOW (ref 4.22–5.81)
RDW: 15.6 % — ABNORMAL HIGH (ref 11.5–15.5)
WBC: 7.2 10*3/uL (ref 4.0–10.5)
nRBC: 0 % (ref 0.0–0.2)

## 2018-06-04 LAB — TROPONIN I: Troponin I: 0.07 ng/mL (ref <0.03)

## 2018-06-04 MED ORDER — CHLORHEXIDINE GLUCONATE CLOTH 2 % EX PADS: 6.0000 | MEDICATED_PAD | Freq: Every day | CUTANEOUS | Status: DC

## 2018-06-04 MED ORDER — CLONIDINE HCL 0.1 MG PO TABS
0.2000 mg | ORAL_TABLET | Freq: Once | ORAL | Status: AC
  Administered 2018-06-04: 0.2 mg via ORAL

## 2018-06-04 MED ORDER — NICOTINE 21 MG/24HR TD PT24: 21.0000 mg | MEDICATED_PATCH | Freq: Every day | TRANSDERMAL | Status: DC

## 2018-06-04 MED ORDER — LABETALOL HCL 5 MG/ML IV SOLN
10.0000 mg | Freq: Once | INTRAVENOUS | Status: AC
  Administered 2018-06-04: 10 mg via INTRAVENOUS
  Filled 2018-06-04: qty 4

## 2018-06-04 MED ORDER — DEXTROSE 50 % IV SOLN
1.0000 | Freq: Once | INTRAVENOUS | Status: AC
  Administered 2018-06-04: 50 mL via INTRAVENOUS
  Filled 2018-06-04: qty 50

## 2018-06-04 MED ORDER — SODIUM BICARBONATE 8.4 % IV SOLN
50.0000 meq | Freq: Once | INTRAVENOUS | Status: AC
  Administered 2018-06-04: 50 meq via INTRAVENOUS
  Filled 2018-06-04: qty 50

## 2018-06-04 MED ORDER — HEPARIN SODIUM (PORCINE) 1000 UNIT/ML DIALYSIS
20.0000 [IU]/kg | INTRAMUSCULAR | Status: DC | PRN
  Administered 2018-06-04: 2000 [IU] via INTRAVENOUS_CENTRAL
  Filled 2018-06-04: qty 2

## 2018-06-04 MED ORDER — INSULIN ASPART 100 UNIT/ML ~~LOC~~ SOLN
5.0000 [IU] | Freq: Once | SUBCUTANEOUS | Status: AC
  Administered 2018-06-04: 13:00:00 5 [IU] via INTRAVENOUS
  Filled 2018-06-04: qty 1

## 2018-06-04 NOTE — H&P (Addendum)
[facility] [place] NAME: Fernando Salas    MR#:  [id number]  DATE OF BIRTH:  1970/09/28  DATE OF ADMISSION:  06/04/2018  PRIMARY CARE PHYSICIAN: Patient, No Pcp Per   REQUESTING/REFERRING PHYSICIAN:   CHIEF COMPLAINT:   Chief Complaint  Patient presents with  . Shortness of Breath    HISTORY OF PRESENT ILLNESS: Fernando Salas  is a 48 y.o. male with a known history of end-stage renal disease on dialysis, hypertension, hyperlipidemia, coronary disease, myocardial infarction, secondary hyperparathyroidism presented to the emergency room for shortness of breath.  Patient got his scheduled dialysis treatment yesterday.  He was evaluated in the emergency room potassium was 6 and a chest x-ray revealed pulmonary edema and fluid overload.  Patient has been evaluated by nephrology and at dialysis will be started today.  It also presented with elevated blood pressures to the emergency room with systolic blood pressure more than 371 mmHg and diastolic more than 696 mmHg.  No complaints of any chest pain.  No complaints of any dizziness or blurry vision.  PAST MEDICAL HISTORY:   Past Medical History:  Diagnosis Date  . CAD (coronary artery disease)    status post multiple myocardial infarctions  . COPD (chronic obstructive pulmonary disease) ([place])   . ESRD (end stage renal disease) ([place])   . Hyperlipidemia   . Hypertension   . Myocardial infarction ([place])    " I had 13 heart attacks, 8 massive"  . Proteinuria   . Renal insufficiency   . Secondary hyperparathyroidism of renal origin ([place])   . Shortness of breath dyspnea    " when I drink a cup of cold water too fast"  . Sleep apnea    pt stated " long story" does not wear CPAP  . Stroke [facility])     PAST SURGICAL HISTORY:  Past Surgical History:  Procedure Laterality Date  . AV FISTULA PLACEMENT Left 01-24-14   By Dr. Hortencia Pilar in [place]  . AV FISTULA PLACEMENT Right 04/30/2014   Procedure: RIGHT BRACHIOCEPHALIC ARTERIOVENOUS (AV) FISTULA CREATION;  Surgeon: Elam Dutch, MD;  Location: [facility] OR;  Service: Vascular;  Laterality: Right;  . AV FISTULA PLACEMENT Left 05/31/2014   Procedure: Left INSERTION OF ARTERIOVENOUS (AV) GORE-TEX GRAFT ARM;  Surgeon: Elam Dutch, MD;  Location: [facility];  Service: Vascular;  Laterality: Left;  . CARDIAC CATHETERIZATION    . CORONARY STENT PLACEMENT     13 stents total  . DIALYSIS/PERMA CATHETER INSERTION N/A 01/15/2017   Procedure: DIALYSIS/PERMA CATHETER INSERTION;  Surgeon: Katha Cabal, MD;  Location: [facility] CV LAB;  Service: Cardiovascular;  Laterality: N/A;    SOCIAL HISTORY:  Social History   Tobacco Use  . Smoking status: Current Every Day Smoker    Packs/day: 0.25    Years: 25.00    Pack years: 6.25    Types: Cigarettes  . Smokeless tobacco: Never Used  Substance Use Topics  . Alcohol use: No    Alcohol/week: 0.0 standard drinks    Frequency: Never    Comment: occasional    FAMILY HISTORY:  Family History  Problem Relation Age of Onset  . Heart disease Mother   . Hyperlipidemia Mother   . Hypertension Mother   . Kidney disease Father   . Kidney disease Brother   . Hyperlipidemia Sister   . Hyperlipidemia Daughter   . Hypertension Sister   . Hypertension Daughter     DRUG ALLERGIES:  Allergies  Allergen Reactions  . Ibuprofen Other (See Comments)    Kidney   . Viagra [Sildenafil]     Lips swell   . Clonidine Derivatives Rash    REVIEW OF SYSTEMS:   CONSTITUTIONAL: No fever, fatigue or weakness.  EYES: No blurred or double vision.  EARS, NOSE, AND THROAT: No tinnitus or ear pain.  RESPIRATORY: No cough, has shortness of breath,  No wheezing or hemoptysis.  CARDIOVASCULAR: No chest pain, orthopnea, edema.  GASTROINTESTINAL: No nausea, vomiting, diarrhea or abdominal pain.  GENITOURINARY: No dysuria, hematuria.  ENDOCRINE: No polyuria, nocturia,  HEMATOLOGY: No anemia, easy  bruising or bleeding SKIN: No rash or lesion. MUSCULOSKELETAL: No joint pain or arthritis.   NEUROLOGIC: No tingling, numbness, weakness.  PSYCHIATRY: No anxiety or depression.   MEDICATIONS AT HOME:  Prior to Admission medications   Medication Sig Start Date End Date Taking? Authorizing Provider  furosemide (LASIX) 80 MG tablet Take 80 mg by mouth See admin instructions. Take 1 tablet (80mg ) every morning on non-dialysis days 05/16/18  Yes [provider]  amLODipine (NORVASC) 10 MG tablet Take 1 tablet (10 mg total) by mouth daily. 05/01/17 05/01/18  Lisa Roca, MD  atorvastatin (LIPITOR) 40 MG tablet Take 1 tablet (40 mg total) by mouth daily. 11/03/16   Demetrios Loll, MD  calcium acetate (PHOSLO) 667 MG capsule Take 1,334 mg by mouth 3 (three) times daily with meals. 12/03/17 12/03/18  [provider]  carvedilol (COREG) 25 MG tablet Take 1 tablet (25 mg total) by mouth 2 (two) times daily. 05/01/17 05/01/18  Lisa Roca, MD  clopidogrel (PLAVIX) 75 MG tablet Take 1 tablet (75 mg total) by mouth daily. 11/03/16   Demetrios Loll, MD  hydrALAZINE (APRESOLINE) 100 MG tablet Take 1 tablet (100 mg total) by mouth 4 (four) times daily. Patient taking differently: Take 100 mg by mouth every 8 (eight) hours.  05/01/17 05/01/18  Lisa Roca, MD  isosorbide dinitrate (ISORDIL) 30 MG tablet Take 30 mg by mouth 3 (three) times daily.     [provider]  Multiple Vitamin (MULTIVITAMIN) tablet Take 1 tablet by mouth daily. 04/10/18   Henreitta Leber, MD      PHYSICAL EXAMINATION:   VITAL SIGNS: Blood pressure (!) 196/128, pulse (!) 127, temperature 97.6 F (36.4 C), temperature source Oral, resp. rate 18, height 6' (1.829 m), weight 99.8 kg, SpO2 94 %.  GENERAL:  48 y.o.-year-old patient lying in the bed with no acute distress.  EYES: Pupils equal, round, reactive to light and accommodation. No scleral icterus. Extraocular muscles intact.  HEENT: Head atraumatic, normocephalic. Oropharynx  and nasopharynx clear.  NECK:  Supple, no jugular venous distention. No thyroid enlargement, no tenderness.  LUNGS: Decreased breath sounds bilaterally,bibasilar crepitations heard. No use of accessory muscles of respiration.  CARDIOVASCULAR: S1, S2 tachycardia. No murmurs, rubs, or gallops.  ABDOMEN: Soft, nontender, nondistended. Bowel sounds present. No organomegaly or mass.  EXTREMITIES: No pedal edema, cyanosis, or clubbing.  NEUROLOGIC: Cranial nerves II through XII are intact. Muscle strength 5/5 in all extremities. Sensation intact. Gait not checked.  PSYCHIATRIC: The patient is alert and oriented x 3.  SKIN: No obvious rash, lesion, or ulcer.   LABORATORY PANEL:   CBC Recent Labs  Lab 06/04/18 1200  WBC 7.2  HGB 10.0*  HCT 33.9*  PLT 171  MCV 104.6*  MCH 30.9  MCHC 29.5*  RDW 15.6*   ------------------------------------------------------------------------------------------------------------------  Chemistries  Recent Labs  Lab 06/04/18 1200  NA 140  K 6.0*  CL 106  CO2 17*  GLUCOSE 87  BUN 65*  CREATININE 14.96*  CALCIUM 8.8*   ------------------------------------------------------------------------------------------------------------------ estimated creatinine clearance is 7.5 mL/min (A) (by C-G formula based on SCr of 14.96 mg/dL (H)). ------------------------------------------------------------------------------------------------------------------ No results for input(s): TSH, T4TOTAL, T3FREE, THYROIDAB in the last 72 hours.  Invalid input(s): FREET3   Coagulation profile No results for input(s): INR, PROTIME in the last 168 hours. ------------------------------------------------------------------------------------------------------------------- No results for input(s): DDIMER in the last 72 hours. -------------------------------------------------------------------------------------------------------------------  Cardiac Enzymes Recent Labs  Lab  06/04/18 1200  TROPONINI 0.07*   ------------------------------------------------------------------------------------------------------------------ Invalid input(s): POCBNP  ---------------------------------------------------------------------------------------------------------------  Urinalysis    Component Value Date/Time   COLORURINE YELLOW (A) 01/12/2017 0848   APPEARANCEUR CLEAR (A) 01/12/2017 0848   APPEARANCEUR Clear 01/17/2014 1139   LABSPEC 1.010 01/12/2017 0848   LABSPEC 1.015 01/17/2014 1139   PHURINE 5.0 01/12/2017 0848   GLUCOSEU 50 (A) 01/12/2017 0848   GLUCOSEU Negative 01/17/2014 1139   HGBUR SMALL (A) 01/12/2017 0848   BILIRUBINUR NEGATIVE 01/12/2017 0848   BILIRUBINUR Negative 01/17/2014 1139   KETONESUR NEGATIVE 01/12/2017 0848   PROTEINUR >=300 (A) 01/12/2017 0848   NITRITE NEGATIVE 01/12/2017 0848   LEUKOCYTESUR NEGATIVE 01/12/2017 0848   LEUKOCYTESUR Negative 01/17/2014 1139     RADIOLOGY: Dg Chest Port 1 View  Result Date: 06/04/2018 CLINICAL DATA:  Shortness of breath beginning at midnight EXAM: PORTABLE CHEST 1 VIEW COMPARISON:  05/23/2018 FINDINGS: Bilateral interstitial thickening and prominence of the central pulmonary vasculature. No pleural effusion or pneumothorax. Stable cardiomegaly. Dual lumen right-sided central venous catheter in satisfactory position. No acute osseous abnormality. IMPRESSION: Findings most consistent with mild pulmonary edema. Electronically Signed   By: Kathreen Devoid   On: 06/04/2018 12:08    EKG: Orders placed or performed during the hospital encounter of 06/04/18  . ED EKG  . ED EKG  . EKG 12-Lead  . EKG 12-Lead    IMPRESSION AND PLAN: 48 year old male patient with a known history of end-stage renal disease on dialysis, hypertension, hyperlipidemia, coronary disease, myocardial infarction, secondary hyperparathyroidism presented to the emergency room for shortness of breath.   -Acute pulmonary edema Admit patient  to telemetry Dialysis today Remove excess fluid Avoid fluid overload  -Acute hyperkalemia Dialysis today Follow-up electrolytes  -Hypertensive urgency Resume blood pressure meds that is Coreg, hydralazine, Norvasc, Isordil Dialysis session Recheck blood pressures after dialysis  -DVT prophylaxis subcu heparin daily  -Elevated troponin secondary to renal failure  -Tobacco abuse Tobacco cessation counseled to the patient for 6 minutes Nicotine patch offered  All the records are reviewed and case discussed with ED provider. Management plans discussed with the patient, family and they are in agreement.  CODE STATUS:Full code Code Status History    Date Active Date Inactive Code Status Order ID Comments User Context   04/09/2018 1552 04/10/2018 1449 Full Code [id number]  Gladstone Lighter, MD Inpatient   03/25/2018 1129 03/26/2018 1534 Full Code [id number]  Fritzi Mandes, MD ED   03/09/2017 0224 03/11/2017 1440 Full Code [id number]  Saundra Shelling, MD Inpatient   02/08/2017 2337 02/12/2017 1557 Full Code [id number]  Gorden Harms, MD Inpatient   01/12/2017 0954 01/16/2017 1603 Full Code [id number]  Loletha Grayer, MD ED   11/01/2016 0329 11/02/2016 1525 Full Code [id number]  Hugelmeyer, Ubaldo Glassing, DO Inpatient     TOTAL TIME TAKING CARE OF THIS PATIENT: 52 minutes.    Saundra Shelling M.D on 06/04/2018 at 2:10 PM  Between 7am to 6pm - Pager - [phone]  After 6pm go to [email or web address] - password [email or web address] [facility]  Office  [phone]  CC: Primary care physician; Patient, No Pcp Per

## 2018-06-04 NOTE — Progress Notes (Signed)
HD Tx End   06/04/18 1745  Hand-Off documentation  Report given to (Full Name) ED RN / Danae Chen 2C  Report received from (Full Name) Trellis Paganini RN  Vital Signs  Temp 98.8 F (37.1 C)  Temp Source Oral  Pulse Rate 70  Pulse Rate Source Monitor  Resp 20  BP (!) 178/90  BP Location Right Arm  BP Method Automatic  Patient Position (if appropriate) Sitting  Oxygen Therapy  SpO2 98 %  O2 Device Room Air  Pain Assessment  Pain Scale 0-10  Pain Score 0  Dialysis Weight  Weight 95.3 kg  Type of Weight Post-Dialysis  During Hemodialysis Assessment  Blood Flow Rate (mL/min) 200 mL/min  Arterial Pressure (mmHg) -190 mmHg  Venous Pressure (mmHg) 180 mmHg  Transmembrane Pressure (mmHg) 60 mmHg  Ultrafiltration Rate (mL/min) 290 mL/min  Dialysate Flow Rate (mL/min) 600 ml/min  Conductivity: Machine  14  HD Safety Checks Performed Yes  Dialysis Fluid Bolus Normal Saline  Bolus Amount (mL) 250 mL  Intra-Hemodialysis Comments Tx completed

## 2018-06-04 NOTE — Progress Notes (Signed)
Post HD Tx     06/04/18 1800  Vital Signs  Pulse Rate 70  Pulse Rate Source Monitor  Resp 18  BP (!) 172/95  BP Location Right Arm  BP Method Automatic  Patient Position (if appropriate) Sitting  Oxygen Therapy  SpO2 99 %  O2 Device Room Air  Post-Hemodialysis Assessment  Rinseback Volume (mL) 250 mL  KECN 69.4 V  Dialyzer Clearance Lightly streaked  Duration of HD Treatment -hour(s) 3.5 hour(s)  Hemodialysis Intake (mL) 500 mL  UF Total -Machine (mL) 1000 mL  Net UF (mL) 500 mL  Tolerated HD Treatment Yes  Post-Hemodialysis Comments Pt home after dialysis   Hemodialysis Catheter Right Subclavian Double-lumen  Placement Date/Time: (c) 02/10/17 1343   Placed prior to admission: Yes  Orientation: Right  Access Location: Subclavian  Hemodialysis Catheter Type: Double-lumen  Site Condition No complications  Blue Lumen Status Flushed;Saline locked;Capped (Central line);Heparin locked  Red Lumen Status Flushed;Saline locked;Capped (Central line);Heparin locked  Purple Lumen Status N/A  Catheter fill solution Heparin 1000 units/ml  Catheter fill volume (Arterial) 2.4 cc  Catheter fill volume (Venous) 2.4  Dressing Type Occlusive;Other (Comment) (old dressing, pt refused dressing change )  Dressing Status Clean;Dry;Intact  Drainage Description None  Post treatment catheter status Capped and Clamped

## 2018-06-04 NOTE — ED Notes (Signed)
Patient being transported to dialysis at this time.

## 2018-06-04 NOTE — Progress Notes (Signed)
Advanced care plan. Purpose of the Encounter: CODE STATUS Parties in Attendance: Patient Patient's Decision Capacity: Good Subjective/Patient's story:  Fernando Salas  is a 48 y.o. male with a known history of end-stage renal disease on dialysis, hypertension, hyperlipidemia, coronary disease, myocardial infarction, secondary hyperparathyroidism presented to the emergency room for shortness of breath.   Patient got his scheduled dialysis treatment yesterday. Objective/Medical story Work-up in the emergency room showed potassium of 6 elevated blood pressure of systolic more than 092 mmHg.  Patient chest x-ray showed acute pulmonary edema and fluid overload.  Needs dialysis session and nephrology follow-up. Goals of care determination:  Advance care directives goals of care and treatment plan discussed Patient wants everything done which includes CPR, intubation ventilator if the need arises. CODE STATUS: Full code Time spent discussing advanced care planning: 16 minutes

## 2018-06-04 NOTE — ED Provider Notes (Signed)
[facility] Emergency Department Provider Note    First MD Initiated Contact with Patient 06/04/18 1139     (approximate)  I have reviewed the triage vital signs and the nursing notes.   HISTORY  Chief Complaint Shortness of Breath    HPI TONATIUH MALLON is a 48 y.o. male extensive past medical history as listed below with end-stage renal disease on dialysis Monday Wednesday and Friday missed dialysis yesterday due to "paying bills ".  He is presenting to the ER with worsening shortness of breath.  Does endorse orthopnea and exertional dyspnea.  No chest pain.  No fevers.  No vomiting, nausea diarrhea.  No productive cough.  No known exposures.    Past Medical History:  Diagnosis Date  . CAD (coronary artery disease)    status post multiple myocardial infarctions  . COPD (chronic obstructive pulmonary disease) ([place])   . ESRD (end stage renal disease) ([place])   . Hyperlipidemia   . Hypertension   . Myocardial infarction ([place])    " I had 13 heart attacks, 8 massive"  . Proteinuria   . Renal insufficiency   . Secondary hyperparathyroidism of renal origin ([place])   . Shortness of breath dyspnea    " when I drink a cup of cold water too fast"  . Sleep apnea    pt stated " long story" does not wear CPAP  . Stroke [facility])    Family History  Problem Relation Age of Onset  . Heart disease Mother   . Hyperlipidemia Mother   . Hypertension Mother   . Kidney disease Father   . Kidney disease Brother   . Hyperlipidemia Sister   . Hyperlipidemia Daughter   . Hypertension Sister   . Hypertension Daughter    Past Surgical History:  Procedure Laterality Date  . AV FISTULA PLACEMENT Left 01-24-14   By Dr. Hortencia Pilar in [place]  . AV FISTULA PLACEMENT Right 04/30/2014   Procedure: RIGHT BRACHIOCEPHALIC ARTERIOVENOUS (AV) FISTULA CREATION;  Surgeon: Elam Dutch, MD;  Location[facility];  Service: Vascular;  Laterality: Right;  . AV FISTULA PLACEMENT  Left 05/31/2014   Procedure: Left INSERTION OF ARTERIOVENOUS (AV) GORE-TEX GRAFT ARM;  Surgeon: Elam Dutch, MD;  Location: [place];  Service: Vascular;  Laterality: Left;  . CARDIAC CATHETERIZATION    . CORONARY STENT PLACEMENT     13 stents total  . DIALYSIS/PERMA CATHETER INSERTION N/A 01/15/2017   Procedure: DIALYSIS/PERMA CATHETER INSERTION;  Surgeon: Katha Cabal, MD;  Location: [place] CV LAB;  Service: Cardiovascular;  Laterality: N/A;   Patient Active Problem List   Diagnosis Date Noted  . Hypertensive urgency, malignant 04/08/2018  . Hypertensive urgency 03/25/2018  . Pulmonary edema 03/09/2017  . 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 Fluid overload 02/08/2017  . Accelerated hypertension 01/12/2017  . AKI (acute kidney injury) ([place])   . Unstable angina ([place]) 11/01/2016      Prior to Admission medications   Medication Sig Start Date End Date Taking? Authorizing Provider  amLODipine (NORVASC) 10 MG tablet Take 1 tablet (10 mg total) by mouth daily. 05/01/17 05/01/18  Lisa Roca, MD  atorvastatin (LIPITOR) 40 MG tablet Take 1 tablet (40 mg total) by mouth daily. 11/03/16   Demetrios Loll, MD  calcium acetate (PHOSLO) 667 MG capsule Take 1,334 mg by mouth 3 (three) times daily with meals. 12/03/17 12/03/18  [provider]  carvedilol (COREG) 25 MG tablet Take 1 tablet (25 mg total) by  mouth 2 (two) times daily. 05/01/17 05/01/18  Lisa Roca, MD  clopidogrel (PLAVIX) 75 MG tablet Take 1 tablet (75 mg total) by mouth daily. 11/03/16   Demetrios Loll, MD  hydrALAZINE (APRESOLINE) 100 MG tablet Take 1 tablet (100 mg total) by mouth 4 (four) times daily. Patient taking differently: Take 100 mg by mouth every 8 (eight) hours.  05/01/17 05/01/18  Lisa Roca, MD  isosorbide dinitrate (ISORDIL) 30 MG tablet Take 30 mg by mouth 3 (three) times daily.     [provider]  Multiple Vitamin (MULTIVITAMIN) tablet Take 1 tablet by mouth daily. 04/10/18   Henreitta Leber, MD     Allergies Ibuprofen; Viagra [sildenafil]; and Clonidine derivatives    Social History Social History   Tobacco Use  . Smoking status: Current Every Day Smoker    Packs/day: 0.25    Years: 25.00    Pack years: 6.25    Types: Cigarettes  . Smokeless tobacco: Never Used  Substance Use Topics  . Alcohol use: No    Alcohol/week: 0.0 standard drinks    Frequency: Never    Comment: occasional  . Drug use: Not Currently    Types: Marijuana    Comment: daily    Review of Systems Patient denies headaches, rhinorrhea, blurry vision, numbness, shortness of breath, chest pain, edema, cough, abdominal pain, nausea, vomiting, diarrhea, dysuria, fevers, rashes or hallucinations unless otherwise stated above in HPI. ____________________________________________   PHYSICAL EXAM:  VITAL SIGNS: Vitals:   06/04/18 1133 06/04/18 1203  BP: (!) 213/132   Pulse:    Resp:    Temp:  97.6 F (36.4 C)  SpO2:      Constitutional: Alert and oriented.  Eyes: Conjunctivae are normal.  Head: Atraumatic. Nose: No congestion/rhinnorhea. Mouth/Throat: Mucous membranes are moist.   Neck: No stridor. Painless ROM.  Cardiovascular: Normal rate, regular rhythm. Grossly normal heart sounds.  Good peripheral circulation. Respiratory: tachypnea with diminished bs in posterior lung fields.  No retractions. Lungs CTAB. Gastrointestinal: Soft and nontender. No distention. No abdominal bruits. No CVA tenderness. Genitourinary:  Musculoskeletal: No lower extremity tenderness nor edema.  No joint effusions. Neurologic:  Normal speech and language. No gross focal neurologic deficits are appreciated. No facial droop Skin:  Skin is warm, dry and intact. No rash noted. Psychiatric: Mood and affect are normal. Speech and behavior are normal.  ____________________________________________   LABS (all labs ordered are listed, but only abnormal results are displayed)  Results for orders placed or performed  during the hospital encounter of 06/04/18 (from the past 24 hour(s))  Basic metabolic panel     Status: Abnormal   Collection Time: 06/04/18 12:00 PM  Result Value Ref Range   Sodium 140 135 - 145 mmol/L   Potassium 6.0 (H) 3.5 - 5.1 mmol/L   Chloride 106 98 - 111 mmol/L   CO2 17 (L) 22 - 32 mmol/L   Glucose, Bld 87 70 - 99 mg/dL   BUN 65 (H) 6 - 20 mg/dL   Creatinine, Ser 14.96 (H) 0.61 - 1.24 mg/dL   Calcium 8.8 (L) 8.9 - 10.3 mg/dL   GFR calc non Af Amer 3 (L) >60 mL/min   GFR calc Af Amer 4 (L) >60 mL/min   Anion gap 17 (H) 5 - 15  CBC     Status: Abnormal   Collection Time: 06/04/18 12:00 PM  Result Value Ref Range   WBC 7.2 4.0 - 10.5 K/uL   RBC 3.24 (L) 4.22 - 5.81 MIL/uL  Hemoglobin 10.0 (L) 13.0 - 17.0 g/dL   HCT 33.9 (L) 39.0 - 52.0 %   MCV 104.6 (H) 80.0 - 100.0 fL   MCH 30.9 26.0 - 34.0 pg   MCHC 29.5 (L) 30.0 - 36.0 g/dL   RDW 15.6 (H) 11.5 - 15.5 %   Platelets 171 150 - 400 K/uL   nRBC 0.0 0.0 - 0.2 %  Troponin I - ONCE - STAT     Status: Abnormal   Collection Time: 06/04/18 12:00 PM  Result Value Ref Range   Troponin I 0.07 (HH) <0.03 ng/mL   ____________________________________________  EKG My review and personal interpretation at Time: 11:36   Indication: sob  Rate: 80  Rhythm: sinus Axis: left Other: non specific  ____________________________________________  RADIOLOGY  I personally reviewed all radiographic images ordered to evaluate for the above acute complaints and reviewed radiology reports and findings.  These findings were personally discussed with the patient.  Please see medical record for radiology report.  ____________________________________________   PROCEDURES  Procedure(s) performed:  .Critical Care Performed by: Merlyn Lot, MD Authorized by: Merlyn Lot, MD   Critical care provider statement:    Critical care time (minutes):  30   Critical care time was exclusive of:  Separately billable procedures and treating  other patients   Critical care was necessary to treat or prevent imminent or life-threatening deterioration of the following conditions:  Renal failure   Critical care was time spent personally by me on the following activities:  Development of treatment plan with patient or surrogate, discussions with consultants, evaluation of patient's response to treatment, examination of patient, obtaining history from patient or surrogate, ordering and performing treatments and interventions, ordering and review of laboratory studies, ordering and review of radiographic studies, pulse oximetry, re-evaluation of patient's condition and review of old charts      Critical Care performed: yes ____________________________________________   INITIAL IMPRESSION / Ray / ED COURSE  Pertinent labs & imaging results that were available during my care of the patient were reviewed by me and considered in my medical decision making (see chart for details).   DDX: Asthma, copd, CHF, pna, ptx, malignancy, Pe, anemia   KENRICK PORE is a 48 y.o. who presents to the ED with shortness of breath as described above.  Exam concerning for pulmonary edema.  Not currently hypoxic but is tachypneic.  Does not require BiPAP at this time will place on supplemental nasal cannula.  Blood will be sent for the by differential.  Clinical Course as of Jun 04 1302  Sat Jun 04, 2018  1216 Temp: 97.6 F (36.4 C) [PR]  1257 Work-up thus far is consistent with pulmonary edema.  Mild hyperkalemia without significant EKG changes.  Will discuss with hospitalist and nephrologist for admission for dialysis.   [PR]    Clinical Course User Index [PR] Merlyn Lot, MD    The patient was evaluated in Emergency Department today for the symptoms described in the history of present illness. He/she was evaluated in the context of the global COVID-19 pandemic, which necessitated consideration that the patient might be at risk  for infection with the SARS-CoV-2 virus that causes COVID-19. Institutional protocols and algorithms that pertain to the evaluation of patients at risk for COVID-19 are in a state of rapid change based on information released by regulatory bodies including the CDC and federal and state organizations. These policies and algorithms were followed during the patient's care in the ED.  As  part of my medical decision making, I reviewed the following data within the [facility] notes reviewed and incorporated, Labs reviewed, notes from prior ED visits and [place] Controlled Substance Database   ____________________________________________   FINAL CLINICAL IMPRESSION(S) / ED DIAGNOSES  Final diagnoses:  SOB (shortness of breath)  Acute pulmonary edema (HCC)  ESRD on dialysis ([place])  Hyperkalemia      NEW MEDICATIONS STARTED DURING THIS VISIT:  New Prescriptions   No medications on file     Note:  This document was prepared using Dragon voice recognition software and may include unintentional dictation errors.    Merlyn Lot, MD 06/04/18 1304

## 2018-06-04 NOTE — ED Notes (Signed)
ED TO INPATIENT HANDOFF REPORT  ED Nurse Name and Phone #: Reannon Candella [phone]  S Name/Age/Gender Fernando Salas 48 y.o. male Room/Bed: EDOTFA/EDOTF  Code Status   Code Status: Prior  Home/SNF/Other Home Patient oriented to: self, place, time and situation Is this baseline? Yes   Triage Complete: Triage complete  Chief Complaint ?  Triage Note Pt states he is a dialysis pt, missed treatment yesterday d/t "paying bills". Goes to [facility] for dialysis. A&O, ambulatory. Has R chest access. Mask in place. States SOB "all the time" that began around midnight. Denies cough. Denies congestion. Denies fever.    Allergies Allergies  Allergen Reactions  . Ibuprofen Other (See Comments)    Kidney   . Viagra [Sildenafil]     Lips swell   . Clonidine Derivatives Rash    Level of Care/Admitting Diagnosis ED Disposition    ED Disposition Condition [facility]: [place] [[id number]]  Level of Care: Telemetry [5]  Diagnosis: Acute pulmonary edema [facility]) [[id number]]  Admitting Physician: Saundra Shelling [[id number]]  Attending Physician: Saundra Shelling [[id number]]  Estimated length of stay: past midnight tomorrow  Certification:: I certify this patient will need inpatient services for at least 2 midnights  PT Class (Do Not Modify): Inpatient [101]  PT Acc Code (Do Not Modify): Private [1]       B Medical/Surgery History Past Medical History:  Diagnosis Date  . CAD (coronary artery disease)    status post multiple myocardial infarctions  . COPD (chronic obstructive pulmonary disease) ([place])   . ESRD (end stage renal disease) ([place])   . Hyperlipidemia   . Hypertension   . Myocardial infarction ([place])    " I had 13 heart attacks, 8 massive"  . Proteinuria   . Renal insufficiency   . Secondary hyperparathyroidism of renal origin ([place])   . Shortness of breath dyspnea    " when I drink a cup of cold water too fast"  . Sleep apnea    pt stated " long story" does  not wear CPAP  . Stroke [facility])    Past Surgical History:  Procedure Laterality Date  . AV FISTULA PLACEMENT Left 01-24-14   By Dr. Hortencia Pilar in [place]  . AV FISTULA PLACEMENT Right 04/30/2014   Procedure: RIGHT BRACHIOCEPHALIC ARTERIOVENOUS (AV) FISTULA CREATION;  Surgeon: Elam Dutch, MD;  Location: [facility] OR;  Service: Vascular;  Laterality: Right;  . AV FISTULA PLACEMENT Left 05/31/2014   Procedure: Left INSERTION OF ARTERIOVENOUS (AV) GORE-TEX GRAFT ARM;  Surgeon: Elam Dutch, MD;  Location: [place];  Service: Vascular;  Laterality: Left;  . CARDIAC CATHETERIZATION    . CORONARY STENT PLACEMENT     13 stents total  . DIALYSIS/PERMA CATHETER INSERTION N/A 01/15/2017   Procedure: DIALYSIS/PERMA CATHETER INSERTION;  Surgeon: Katha Cabal, MD;  Location: [place] CV LAB;  Service: Cardiovascular;  Laterality: N/A;     A IV Location/Drains/Wounds Patient Lines/Drains/Airways Status   Active Line/Drains/Airways    Name:   Placement date:   Placement time:   Site:   Days:   Peripheral IV 06/04/18 Right Hand   06/04/18    1158    Hand   less than 1   Fistula / Graft Right Upper arm Arteriovenous fistula   04/30/14    0826    Upper arm   1496   Fistula / Graft Left Upper arm Arteriovenous vein graft   05/31/14    1423  Upper arm   1465   Hemodialysis Catheter Right Subclavian Double-lumen   02/10/17    1343    Subclavian   479          Intake/Output Last 24 hours No intake or output data in the 24 hours ending 06/04/18 1513  Labs/Imaging Results for orders placed or performed during the hospital encounter of 06/04/18 (from the past 48 hour(s))  Basic metabolic panel     Status: Abnormal   Collection Time: 06/04/18 12:00 PM  Result Value Ref Range   Sodium 140 135 - 145 mmol/L   Potassium 6.0 (H) 3.5 - 5.1 mmol/L    Comment: HEMOLYSIS AT THIS LEVEL MAY AFFECT RESULT   Chloride 106 98 - 111 mmol/L   CO2 17 (L) 22 - 32 mmol/L   Glucose, Bld 87 70 - 99  mg/dL   BUN 65 (H) 6 - 20 mg/dL   Creatinine, Ser 14.96 (H) 0.61 - 1.24 mg/dL   Calcium 8.8 (L) 8.9 - 10.3 mg/dL   GFR calc non Af Amer 3 (L) >60 mL/min   GFR calc Af Amer 4 (L) >60 mL/min   Anion gap 17 (H) 5 - 15    Comment: Performed at [facility], [address] [place], [place] [address]  CBC     Status: Abnormal   Collection Time: 06/04/18 12:00 PM  Result Value Ref Range   WBC 7.2 4.0 - 10.5 K/uL   RBC 3.24 (L) 4.22 - 5.81 MIL/uL   Hemoglobin 10.0 (L) 13.0 - 17.0 g/dL   HCT 33.9 (L) 39.0 - 52.0 %   MCV 104.6 (H) 80.0 - 100.0 fL   MCH 30.9 26.0 - 34.0 pg   MCHC 29.5 (L) 30.0 - 36.0 g/dL   RDW 15.6 (H) 11.5 - 15.5 %   Platelets 171 150 - 400 K/uL   nRBC 0.0 0.0 - 0.2 %    Comment: Performed at [facility], [address] [place], [place] [address]  Troponin I - ONCE - STAT     Status: Abnormal   Collection Time: 06/04/18 12:00 PM  Result Value Ref Range   Troponin I 0.07 (HH) <0.03 ng/mL    Comment: CRITICAL RESULT CALLED TO, READ BACK BY AND VERIFIED WITH Jamelia Varano@1250  ON 06/04/18 BY HKP Performed at [facility], [address] [place], [place] [address]    Dg Chest Port 1 View  Result Date: 06/04/2018 CLINICAL DATA:  Shortness of breath beginning at midnight EXAM: PORTABLE CHEST 1 VIEW COMPARISON:  05/23/2018 FINDINGS: Bilateral interstitial thickening and prominence of the central pulmonary vasculature. No pleural effusion or pneumothorax. Stable cardiomegaly. Dual lumen right-sided central venous catheter in satisfactory position. No acute osseous abnormality. IMPRESSION: Findings most consistent with mild pulmonary edema. Electronically Signed   By: Kathreen Devoid   On: 06/04/2018 12:08    Pending Labs Unresulted Labs (From admission, onward)    Start     Ordered   Signed and Held  CBC  (heparin)  Once,   R    Comments:  Baseline for heparin therapy IF NOT ALREADY DRAWN.  Notify MD if PLT < 100 K.    Signed and Held   Signed  and Held  Creatinine, serum  (heparin)  Once,   R    Comments:  Baseline for heparin therapy IF NOT ALREADY DRAWN.    Signed and Held   Signed and Held  Basic metabolic panel  Tomorrow morning,   R  Signed and Held          Vitals/Pain Today's Vitals   06/04/18 1400 06/04/18 1415 06/04/18 1430 06/04/18 1445  BP: (!) 192/131 (!) 190/132 (!) 189/128 (!) 198/111  Pulse: 81 81 81 68  Resp: 18 18 18 18   Temp: 97.6 F (36.4 C) 97.6 F (36.4 C)    TempSrc: Oral Oral    SpO2: 99% 96% 100% 100%  Weight:  96.2 kg    Height:      PainSc:  0-No pain      Isolation Precautions No active isolations  Medications Medications  Chlorhexidine Gluconate Cloth 2 % PADS 6 each (has no administration in time range)  nicotine (NICODERM CQ - dosed in mg/24 hours) patch 21 mg (has no administration in time range)  labetalol (NORMODYNE,TRANDATE) injection 10 mg (10 mg Intravenous Given 06/04/18 1309)  sodium bicarbonate injection 50 mEq (50 mEq Intravenous Given 06/04/18 1317)  insulin aspart (novoLOG) injection 5 Units (5 Units Intravenous Given 06/04/18 1312)  dextrose 50 % solution 50 mL (50 mLs Intravenous Given 06/04/18 1313)  cloNIDine (CATAPRES) tablet 0.2 mg (0.2 mg Oral Given 06/04/18 1430)    Mobility walks Low fall risk   Focused Assessments Neuro Assessment Handoff:   Cardiac Rhythm: Normal sinus rhythm       Neuro Assessment:   Neuro Checks:      Last Documented NIHSS Modified Score:   Has TPA been given? No If patient is a Neuro Trauma and patient is going to OR before floor call report to [place] nurse: [phone] or [phone]     R Recommendations: See Admitting Provider Note  Report given to:   Additional Notes:

## 2018-06-04 NOTE — ED Notes (Signed)
Patient AA0x4. NAD.

## 2018-06-04 NOTE — Discharge Summary (Signed)
[name]: Fernando Salas    MR#:  [id number]  DATE OF BIRTH:  08/28/1970  DATE OF ADMISSION:  06/04/2018 ADMITTING PHYSICIAN: Pyreddy MD  DATE OF DISCHARGE: 06/04/2018  6:44 PM  PRIMARY CARE PHYSICIAN: Patient, No Pcp Per   ADMISSION DIAGNOSIS:  Acute pulmonary edema Acute hyperkalemia Hypertensive urgency End-stage renal disease on dialysis DISCHARGE DIAGNOSIS:  Active Problems:   Pulmonary edema Acute hyperkalemia Hypertensive urgency End-stage renal disease on dialysis  SECONDARY DIAGNOSIS:   Past Medical History:  Diagnosis Date  . CAD (coronary artery disease)    status post multiple myocardial infarctions  . COPD (chronic obstructive pulmonary disease) ([place])   . ESRD (end stage renal disease) ([place])   . Hyperlipidemia   . Hypertension   . Myocardial infarction ([place])    " I had 13 heart attacks, 8 massive"  . Proteinuria   . Renal insufficiency   . Secondary hyperparathyroidism of renal origin ([place])   . Shortness of breath dyspnea    " when I drink a cup of cold water too fast"  . Sleep apnea    pt stated " long story" does not wear CPAP  . Stroke [facility])      ADMITTING HISTORY Fernando Salas  is a 48 y.o. male with a known history of end-stage renal disease on dialysis, hypertension, hyperlipidemia, coronary disease, myocardial infarction, secondary hyperparathyroidism presented to the emergency room for shortness of breath.  Patient got his scheduled dialysis treatment yesterday.  He was evaluated in the emergency room potassium was 6 and a chest x-ray revealed pulmonary edema and fluid overload.  Patient has been evaluated by nephrology and at dialysis will be started today.  It also presented with elevated blood pressures to the emergency room with systolic blood pressure more than 282 mmHg and diastolic more than 060 mmHg.  No complaints of any chest pain.  No complaints of any dizziness or blurry  vision.  HOSPITAL COURSE:  Patient was admitted to medical floor.  Patient underwent dialysis under nephrology.  His blood pressure was better controlled after dialysis and after his blood pressure medications have been started.  Potassium was also taken care of with the help of dialysis.  Excess fluid was removed patient's shortness of breath resolved.  Case was discussed with nephrology and patient wanted to go home.  Patient will be discharged home completing dialysis.  Follow-up with nephrology in the clinic.  CONSULTS OBTAINED:  Treatment Team:  Murlean Iba, MD  DRUG ALLERGIES:   Allergies  Allergen Reactions  . Ibuprofen Other (See Comments)    Kidney   . Viagra [Sildenafil]     Lips swell   . Clonidine Derivatives Rash    DISCHARGE MEDICATIONS:   Allergies as of 06/04/2018      Reactions   Ibuprofen Other (See Comments)   Kidney    Viagra [sildenafil]    Lips swell    Clonidine Derivatives Rash      Medication List    TAKE these medications   amLODipine 10 MG tablet Commonly known as:  NORVASC Take 1 tablet (10 mg total) by mouth daily.   atorvastatin 40 MG tablet Commonly known as:  LIPITOR Take 1 tablet (40 mg total) by mouth daily.   calcium acetate 667 MG capsule Commonly known as:  PHOSLO Take 1,334 mg by mouth 3 (three) times daily with meals.   carvedilol 25 MG tablet Commonly known as:  Coreg Take 1  tablet (25 mg total) by mouth 2 (two) times daily.   clopidogrel 75 MG tablet Commonly known as:  PLAVIX Take 1 tablet (75 mg total) by mouth daily.   furosemide 80 MG tablet Commonly known as:  LASIX Take 80 mg by mouth See admin instructions. Take 1 tablet (80mg ) every morning on non-dialysis days   hydrALAZINE 100 MG tablet Commonly known as:  APRESOLINE Take 1 tablet (100 mg total) by mouth 4 (four) times daily. What changed:  when to take this   isosorbide dinitrate 30 MG tablet Commonly known as:  ISORDIL Take 30 mg by mouth 3 (three)  times daily.   multivitamin tablet Take 1 tablet by mouth daily.       Today  Patient seen today Doing well No shortness of breath after dialysis Feels better No chest pain Hemodynamically stable  VITAL SIGNS:  Blood pressure 150/90 millimeters of mercury, pulse rate 88/min, respiratory 18/min, temperature 98.4 F, saturation 98% on room air  I/O:    Intake/Output Summary (Last 24 hours) at 06/04/2018 1911 Last data filed at 06/04/2018 1800 Gross per 24 hour  Intake -  Output 500 ml  Net -500 ml    PHYSICAL EXAMINATION:  Physical Exam  GENERAL:  48 y.o.-year-old patient lying in the bed with no acute distress.  LUNGS: Normal breath sounds bilaterally, no wheezing, rales,rhonchi or crepitation. No use of accessory muscles of respiration.  CARDIOVASCULAR: S1, S2 normal. No murmurs, rubs, or gallops.  ABDOMEN: Soft, non-tender, non-distended. Bowel sounds present. No organomegaly or mass.  NEUROLOGIC: Moves all 4 extremities. PSYCHIATRIC: The patient is alert and oriented x 3.  SKIN: No obvious rash, lesion, or ulcer.   DATA REVIEW:   CBC Recent Labs  Lab 06/04/18 1200  WBC 7.2  HGB 10.0*  HCT 33.9*  PLT 171    Chemistries  Recent Labs  Lab 06/04/18 1200  NA 140  K 6.0*  CL 106  CO2 17*  GLUCOSE 87  BUN 65*  CREATININE 14.96*  CALCIUM 8.8*    Cardiac Enzymes Recent Labs  Lab 06/04/18 1200  TROPONINI 0.07*    Microbiology Results  Results for orders placed or performed during the hospital encounter of 04/08/18  MRSA PCR Screening     Status: None   Collection Time: 04/09/18  4:03 PM  Result Value Ref Range Status   MRSA by PCR NEGATIVE NEGATIVE Final    Comment:        The GeneXpert MRSA Assay (FDA approved for NASAL specimens only), is one component of a comprehensive MRSA colonization surveillance program. It is not intended to diagnose MRSA infection nor to guide or monitor treatment for MRSA infections. Performed at [facility], [address] [place], [place] [address]     RADIOLOGY:  Dg Chest Port 1 View  Result Date: 06/04/2018 CLINICAL DATA:  Shortness of breath beginning at midnight EXAM: PORTABLE CHEST 1 VIEW COMPARISON:  05/23/2018 FINDINGS: Bilateral interstitial thickening and prominence of the central pulmonary vasculature. No pleural effusion or pneumothorax. Stable cardiomegaly. Dual lumen right-sided central venous catheter in satisfactory position. No acute osseous abnormality. IMPRESSION: Findings most consistent with mild pulmonary edema. Electronically Signed   By: Kathreen Devoid   On: 06/04/2018 12:08    Follow up with PCP in 1 week.  Management plans discussed with the patient, family and they are in agreement.  CODE STATUS: Full code Code Status History    Date Active Date Inactive Code Status Order ID Comments  User Context   04/09/2018 1552 04/10/2018 1449 Full Code [id number]  Gladstone Lighter, MD Inpatient   03/25/2018 1129 03/26/2018 1534 Full Code [id number]  Fritzi Mandes, MD ED   03/09/2017 0224 03/11/2017 1440 Full Code [id number]  Saundra Shelling, MD Inpatient   02/08/2017 2337 02/12/2017 1557 Full Code [id number]  Gorden Harms, MD Inpatient   01/12/2017 0954 01/16/2017 1603 Full Code [id number]  Loletha Grayer, MD ED   11/01/2016 0329 11/02/2016 1525 Full Code [id number]  Hugelmeyer, Ubaldo Glassing, DO Inpatient      TOTAL TIME TAKING CARE OF THIS PATIENT ON DAY OF DISCHARGE: more than 35 minutes.   Saundra Shelling M.D on 06/04/2018 at 7:11 PM  Between 7am to 6pm - Pager - [phone]  After 6pm go to [email or web address] - password [email or web address] [facility]  Office  [phone]  CC: Primary care physician; Patient, No Pcp Per  Note: This dictation was prepared with Dragon dictation along with smaller phrase technology. Any transcriptional errors that result from this process are unintentional.

## 2018-06-04 NOTE — Progress Notes (Signed)
Post HD Assessment  Pt removed 527mL fluid during dialysis, tolerated tx well. Pt denies chest pain. Denies SOB.   BP remain elevated @ 170s over 90s post tx. Pt voices " I am going home after this" multiple times. Clarified with floor RN Danae Chen) and MD (Dr. Estanislado Pandy) -- ED MD putting in d/c orders after speaking with the patient.     06/04/18 1800  Neurological  Level of Consciousness Alert  Orientation Level Oriented X4  Respiratory  Respiratory Pattern Regular  Chest Assessment Chest expansion symmetrical  R Lower Breath Sounds Diminished  L Lower Breath Sounds Diminished  Cardiac  ECG Monitor Yes  Cardiac Rhythm NSR  Ectopy Multifocal PVC's  Ectopy Frequency Occasional  Vascular  R Radial Pulse +2  L Radial Pulse +2  Edema Generalized  Generalized Edema +1  Integumentary  Integumentary (WDL) X  Skin Color Appropriate for ethnicity  Skin Condition Dry  Additional Integumentary Comments HD Pt  Musculoskeletal  Musculoskeletal (WDL) WDL  GU Assessment  Genitourinary (WDL)  (HD pt)  Psychosocial  Psychosocial (WDL) WDL

## 2018-06-04 NOTE — ED Notes (Signed)
First Nurse Note: Pt states that he is a dialysis pt, normally goes to [facility]. Pt states that he is more short of breath than normal. Doesn't think he can make it to [facility]

## 2018-06-04 NOTE — ED Triage Notes (Signed)
Pt states he is a dialysis pt, missed treatment yesterday d/t "paying bills". Goes to [facility] for dialysis. A&O, ambulatory. Has R chest access. Mask in place. States SOB "all the time" that began around midnight. Denies cough. Denies congestion. Denies fever.

## 2018-06-04 NOTE — ED Notes (Signed)
Called lab to draw patient's blood work.

## 2018-06-04 NOTE — Progress Notes (Signed)
[facility]  ROUNDING NOTE   Subjective:   Mr. ALROY PORTELA did not get his scheduled hemodialysis treatment yesterday. Patient presents with shortness of breath asking for dialysis treatment.  Potassium elevated at 6.0 Weighed in HDU. =97 kg EDW 97.6 kg  Objective:  Vital signs in last 24 hours:  Temp:  [97.6 F (36.4 C)] 97.6 F (36.4 C) (04/04 1203) Pulse Rate:  [76-127] 127 (04/04 1328) Resp:  [15-28] 18 (04/04 1331) BP: (196-213)/(128-145) 196/128 (04/04 1331) SpO2:  [94 %-97 %] 94 % (04/04 1328) Weight:  [99.8 kg] 99.8 kg (04/04 1132)  Weight change:  Filed Weights   06/04/18 1132  Weight: 99.8 kg    Intake/Output: No intake/output data recorded.   Intake/Output this shift:  No intake/output data recorded.  Physical Exam: General: NAD,   Head: Normocephalic, atraumatic. Moist oral mucosal membranes  Eyes: Anicteric,   Neck: Supple, trachea midline  Lungs:  Clear to auscultation  Heart: Regular rate and rhythm  Abdomen:  Soft, nontender,   Extremities:  no peripheral edema.  Neurologic: Nonfocal, moving all four extremities  Skin: No lesions  Access: RIJ permcath    Basic Metabolic Panel: Recent Labs  Lab 06/04/18 1200  NA 140  K 6.0*  CL 106  CO2 17*  GLUCOSE 87  BUN 65*  CREATININE 14.96*  CALCIUM 8.8*    Liver Function Tests: No results for input(s): AST, ALT, ALKPHOS, BILITOT, PROT, ALBUMIN in the last 168 hours. No results for input(s): LIPASE, AMYLASE in the last 168 hours. No results for input(s): AMMONIA in the last 168 hours.  CBC: Recent Labs  Lab 06/04/18 1200  WBC 7.2  HGB 10.0*  HCT 33.9*  MCV 104.6*  PLT 171    Cardiac Enzymes: Recent Labs  Lab 06/04/18 1200  TROPONINI 0.07*    BNP: Invalid input(s): POCBNP  CBG: No results for input(s): GLUCAP in the last 168 hours.  Microbiology: Results for orders placed or performed during the hospital encounter of 04/08/18  MRSA PCR Screening     Status:  None   Collection Time: 04/09/18  4:03 PM  Result Value Ref Range Status   MRSA by PCR NEGATIVE NEGATIVE Final    Comment:        The GeneXpert MRSA Assay (FDA approved for NASAL specimens only), is one component of a comprehensive MRSA colonization surveillance program. It is not intended to diagnose MRSA infection nor to guide or monitor treatment for MRSA infections. Performed at [facility], [address] [place], [place] [address]     Coagulation Studies: No results for input(s): LABPROT, INR in the last 72 hours.  Urinalysis: No results for input(s): COLORURINE, LABSPEC, PHURINE, GLUCOSEU, HGBUR, BILIRUBINUR, KETONESUR, PROTEINUR, UROBILINOGEN, NITRITE, LEUKOCYTESUR in the last 72 hours.  Invalid input(s): APPERANCEUR    Imaging: Dg Chest Port 1 View  Result Date: 06/04/2018 CLINICAL DATA:  Shortness of breath beginning at midnight EXAM: PORTABLE CHEST 1 VIEW COMPARISON:  05/23/2018 FINDINGS: Bilateral interstitial thickening and prominence of the central pulmonary vasculature. No pleural effusion or pneumothorax. Stable cardiomegaly. Dual lumen right-sided central venous catheter in satisfactory position. No acute osseous abnormality. IMPRESSION: Findings most consistent with mild pulmonary edema. Electronically Signed   By: Kathreen Devoid   On: 06/04/2018 12:08     Medications:    . [START ON 06/05/2018] Chlorhexidine Gluconate Cloth  6 each Topical Q0600  . nicotine  21 mg Transdermal Daily     Assessment/ Plan:  Mr. Rally  D Mancinas is a 48 y.o. black male with end stage renal disease on hemodialysis, hypertension, CVA, sleep apnea, coronary artery disease, COPD, anti-social personality disorder.   1. End Stage Renal Disease with hyperkalemia: currently without an outpatient unit. Usually gets treatment at [facility].  - Hemodialysis for today. UF goal 0.5-1 kg as he is below dry weight  2. Malignant Hypertension: - likely causing SOB - restart  home meds  3. Anemia of chronic kidney disease: hemoglobin 10 - Hold EPO due to malignant HTN   4. Secondary Hyperparathyroidism:   - calcium acetate with meals.    LOS: 0 Dystany Duffy 4/4/20201:45 PM

## 2018-06-04 NOTE — Progress Notes (Signed)
HD start    06/04/18 1415  Vital Signs  Temp 97.6 F (36.4 C)  Temp Source Oral  Pulse Rate 81  Resp 18  BP (!) 190/132  BP Location Right Arm  BP Method Automatic  Patient Position (if appropriate) Lying  Oxygen Therapy  SpO2 96 %  O2 Device Room Air  Pain Assessment  Pain Scale 0-10  Pain Score 0  Dialysis Weight  Weight 96.2 kg  Type of Weight Pre-Dialysis  During Hemodialysis Assessment  Blood Flow Rate (mL/min) 400 mL/min  Arterial Pressure (mmHg) -150 mmHg  Venous Pressure (mmHg) 150 mmHg  Transmembrane Pressure (mmHg) 50 mmHg  Ultrafiltration Rate (mL/min) 290 mL/min  Dialysate Flow Rate (mL/min) 600 ml/min  Conductivity: Machine  14  HD Safety Checks Performed Yes  Dialysis Fluid Bolus Normal Saline  Bolus Amount (mL) 250 mL  Intra-Hemodialysis Comments Tx initiated (cvc care per policy)

## 2018-06-08 ENCOUNTER — Encounter: Payer: Self-pay | Admitting: Emergency Medicine

## 2018-06-08 ENCOUNTER — Other Ambulatory Visit: Payer: Self-pay

## 2018-06-08 ENCOUNTER — Non-Acute Institutional Stay
Admission: EM | Admit: 2018-06-08 | Discharge: 2018-06-08 | Disposition: A | Discharge status: Home / Self Care | Payer: Medicare Other | Attending: Emergency Medicine | Admitting: Emergency Medicine

## 2018-06-08 ENCOUNTER — Emergency Department: Payer: Medicare Other

## 2018-06-08 DIAGNOSIS — N186 End stage renal disease: Secondary | ICD-10-CM | POA: Diagnosis present

## 2018-06-08 DIAGNOSIS — I2510 Atherosclerotic heart disease of native coronary artery without angina pectoris: Secondary | ICD-10-CM | POA: Diagnosis not present

## 2018-06-08 DIAGNOSIS — Z992 Dependence on renal dialysis: Secondary | ICD-10-CM | POA: Diagnosis present

## 2018-06-08 DIAGNOSIS — E875 Hyperkalemia: Secondary | ICD-10-CM | POA: Insufficient documentation

## 2018-06-08 DIAGNOSIS — I120 Hypertensive chronic kidney disease with stage 5 chronic kidney disease or end stage renal disease: Secondary | ICD-10-CM | POA: Diagnosis not present

## 2018-06-08 DIAGNOSIS — G4730 Sleep apnea, unspecified: Secondary | ICD-10-CM | POA: Insufficient documentation

## 2018-06-08 DIAGNOSIS — F602 Antisocial personality disorder: Secondary | ICD-10-CM | POA: Diagnosis not present

## 2018-06-08 DIAGNOSIS — J811 Chronic pulmonary edema: Secondary | ICD-10-CM | POA: Diagnosis not present

## 2018-06-08 DIAGNOSIS — J449 Chronic obstructive pulmonary disease, unspecified: Secondary | ICD-10-CM | POA: Insufficient documentation

## 2018-06-08 LAB — CBC WITH DIFFERENTIAL/PLATELET
Abs Immature Granulocytes: 0.02 10*3/uL (ref 0.00–0.07)
Basophils Absolute: 0 10*3/uL (ref 0.0–0.1)
Basophils Relative: 1 %
Eosinophils Absolute: 0.2 10*3/uL (ref 0.0–0.5)
Eosinophils Relative: 3 %
HCT: 32.2 % — ABNORMAL LOW (ref 39.0–52.0)
Hemoglobin: 10.2 g/dL — ABNORMAL LOW (ref 13.0–17.0)
Immature Granulocytes: 0 %
Lymphocytes Relative: 21 %
Lymphs Abs: 1.4 10*3/uL (ref 0.7–4.0)
MCH: 30.8 pg (ref 26.0–34.0)
MCHC: 31.7 g/dL (ref 30.0–36.0)
MCV: 97.3 fL (ref 80.0–100.0)
Monocytes Absolute: 0.7 10*3/uL (ref 0.1–1.0)
Monocytes Relative: 10 %
Neutro Abs: 4.3 10*3/uL (ref 1.7–7.7)
Neutrophils Relative %: 65 %
Platelets: 202 10*3/uL (ref 150–400)
RBC: 3.31 MIL/uL — ABNORMAL LOW (ref 4.22–5.81)
RDW: 15.5 % (ref 11.5–15.5)
WBC: 6.6 10*3/uL (ref 4.0–10.5)
nRBC: 0 % (ref 0.0–0.2)

## 2018-06-08 LAB — BASIC METABOLIC PANEL
Anion gap: 13 (ref 5–15)
BUN: 48 mg/dL — ABNORMAL HIGH (ref 6–20)
CO2: 22 mmol/L (ref 22–32)
Calcium: 8.9 mg/dL (ref 8.9–10.3)
Chloride: 105 mmol/L (ref 98–111)
Creatinine, Ser: 11.69 mg/dL — ABNORMAL HIGH (ref 0.61–1.24)
GFR calc Af Amer: 5 mL/min — ABNORMAL LOW (ref >60)
GFR calc non Af Amer: 5 mL/min — ABNORMAL LOW (ref >60)
Glucose, Bld: 92 mg/dL (ref 70–99)
Potassium: 5.3 mmol/L — ABNORMAL HIGH (ref 3.5–5.1)
Sodium: 140 mmol/L (ref 135–145)

## 2018-06-08 LAB — TROPONIN I: Troponin I: 0.07 ng/mL (ref <0.03)

## 2018-06-08 MED ORDER — CHLORHEXIDINE GLUCONATE CLOTH 2 % EX PADS
6.0000 | MEDICATED_PAD | Freq: Every day | CUTANEOUS | Status: DC
  Filled 2018-06-08: qty 6

## 2018-06-08 MED ORDER — GENERIC EXTERNAL MEDICATION
2.20 | Status: DC
Start: ? — End: 2018-06-08

## 2018-06-08 MED ORDER — SODIUM CHLORIDE 0.9 % IV SOLN
200.00 | INTRAVENOUS | Status: DC
Start: ? — End: 2018-06-08

## 2018-06-08 MED ORDER — GENERIC EXTERNAL MEDICATION
2.40 | Status: DC
Start: ? — End: 2018-06-08

## 2018-06-08 MED ORDER — HEPARIN SODIUM (PORCINE) 1000 UNIT/ML IJ SOLN
4000.00 | INTRAMUSCULAR | Status: DC
Start: ? — End: 2018-06-08

## 2018-06-08 NOTE — ED Provider Notes (Signed)
Patient has returned from dialysis  He was seen by nephrology, previously also seen by ED physician Dr. Jimmye Norman.  Both do note plan to discharge after dialysis.  Nephrology has notated the patient may be discharged home.  Vitals:   06/08/18 1830 06/08/18 1849  BP: (!) 197/109 (!) 211/131  Pulse: 80 85  Resp: 20 18  Temp: 97.8 F (36.6 C) 98.1 F (36.7 C)  SpO2: 100% 98%     Patient's blood pressure is severely elevated.  He is asymptomatic.  Also has dialysis note with nephrologist indicates significant hypertension.  Patient tells me that he did not get his medication today, he does not wish to stay to take them as he feels fine and he plans to take his medications when he gets home.  Discussed with the patient, I think this is an agreeable plan as he has prescriptions and reports that he will take his medications when he gets home.  He is completely asymptomatic feels well no longer have any shortness of breath.  Return precautions and treatment recommendations and follow-up discussed with the patient who is agreeable with the plan.  Patient affirms his plan is to have a dialysis on Friday   Delman Kitten, MD 06/08/18 1900

## 2018-06-08 NOTE — Progress Notes (Signed)
Post HD assessment    06/08/18 1836  Neurological  Level of Consciousness Alert  Orientation Level Oriented X4  Respiratory  Respiratory Pattern Regular  Chest Assessment Chest expansion symmetrical  Bilateral Breath Sounds Clear;Diminished  Cough None  Cardiac  Pulse Regular  Heart Sounds S1, S2  ECG Monitor Yes  Cardiac Rhythm NSR  Ectopy Multifocal PVC's  Ectopy Frequency Occasional  Vascular  R Radial Pulse +2  L Radial Pulse +2  Edema Generalized  Generalized Edema +1  Psychosocial  Psychosocial (WDL) WDL

## 2018-06-08 NOTE — Progress Notes (Signed)
HD Tx started w/o complication    16/10/96 1500  Vital Signs  Pulse Rate 82  Resp (!) 22  BP (!) 207/136  During Hemodialysis Assessment  Blood Flow Rate (mL/min) 400 mL/min  Arterial Pressure (mmHg) -170 mmHg  Venous Pressure (mmHg) 140 mmHg  Transmembrane Pressure (mmHg) 50 mmHg  Ultrafiltration Rate (mL/min) 500 mL/min  Dialysate Flow Rate (mL/min) 600 ml/min  Conductivity: Machine  13.9  HD Safety Checks Performed Yes  Dialysis Fluid Bolus Normal Saline  Bolus Amount (mL) 250 mL  Intra-Hemodialysis Comments Tx initiated

## 2018-06-08 NOTE — Progress Notes (Signed)
GD TX completed, lost 15 min, due to clotting in venous chamber.    06/08/18 1815  Vital Signs  Pulse Rate 80  Pulse Rate Source Monitor  Resp 20  BP (!) 198/110  BP Location Right Arm  BP Method Automatic  Patient Position (if appropriate) Sitting  Oxygen Therapy  SpO2 98 %  O2 Device Room Air  Pulse Oximetry Type Continuous  During Hemodialysis Assessment  HD Safety Checks Performed Yes  KECN 74.9 KECN  Dialysis Fluid Bolus Normal Saline  Bolus Amount (mL) 250 mL  Intra-Hemodialysis Comments Tx completed;Tolerated well

## 2018-06-08 NOTE — Discharge Instructions (Addendum)
Please follow-up for your next dialysis Friday this week.  Please take your medications you are prescribed when you get home.   Return to the ER if trouble breathing, headache, chest pain, or other new concerns arise.

## 2018-06-08 NOTE — ED Triage Notes (Signed)
Pt states "I had some personal things to do this morning and I didn't go to dialysis." Pt states a few hours prior he became short of breath "like I do when I miss dialysis." Pt denies cough/pain/fever.

## 2018-06-08 NOTE — ED Notes (Signed)
RN called lab to check on BMP, blood is being run currently.

## 2018-06-08 NOTE — Progress Notes (Signed)
[facility]  ROUNDING NOTE   Subjective:   [name] did not get his scheduled hemodialysis treatment today at [facility]  Patient presents with shortness of breath asking for dialysis treatment.   K 5.3 CXR with pulmonary edema  Objective:  Vital signs in last 24 hours:  Temp:  [97.7 F (36.5 C)-97.8 F (36.6 C)] 97.8 F (36.6 C) (04/08 1456) Pulse Rate:  [72-82] 74 (04/08 1515) Resp:  [14-22] 14 (04/08 1515) BP: (192-207)/(117-139) 197/139 (04/08 1515) SpO2:  [93 %-98 %] 98 % (04/08 1456) Weight:  [75.1 kg-99.8 kg] 75.1 kg (04/08 1456)  Weight change:  Filed Weights   06/08/18 1259 06/08/18 1456  Weight: 99.8 kg 75.1 kg    Intake/Output: No intake/output data recorded.   Intake/Output this shift:  No intake/output data recorded.  Physical Exam: General: NAD,   Head: Normocephalic, atraumatic. Moist oral mucosal membranes  Eyes: Anicteric,   Neck: Supple, trachea midline  Lungs:  Clear to auscultation  Heart: Regular rate and rhythm  Abdomen:  Soft, nontender,   Extremities:  no peripheral edema.  Neurologic: Nonfocal, moving all four extremities  Skin: No lesions  Access: RIJ permcath    Basic Metabolic Panel: Recent Labs  Lab 06/04/18 1200 06/08/18 1303  NA 140 140  K 6.0* 5.3*  CL 106 105  CO2 17* 22  GLUCOSE 87 92  BUN 65* 48*  CREATININE 14.96* 11.69*  CALCIUM 8.8* 8.9    Liver Function Tests: No results for input(s): AST, ALT, ALKPHOS, BILITOT, PROT, ALBUMIN in the last 168 hours. No results for input(s): LIPASE, AMYLASE in the last 168 hours. No results for input(s): AMMONIA in the last 168 hours.  CBC: Recent Labs  Lab 06/04/18 1200 06/08/18 1303  WBC 7.2 6.6  NEUTROABS  --  4.3  HGB 10.0* 10.2*  HCT 33.9* 32.2*  MCV 104.6* 97.3  PLT 171 202    Cardiac Enzymes: Recent Labs  Lab 06/04/18 1200 06/08/18 1303  TROPONINI 0.07* 0.07*    BNP: Invalid input(s): POCBNP  CBG: No results for  input(s): GLUCAP in the last 168 hours.  Microbiology: Results for orders placed or performed during the hospital encounter of 04/08/18  MRSA PCR Screening     Status: None   Collection Time: 04/09/18  4:03 PM  Result Value Ref Range Status   MRSA by PCR NEGATIVE NEGATIVE Final    Comment:        The GeneXpert MRSA Assay (FDA approved for NASAL specimens only), is one component of a comprehensive MRSA colonization surveillance program. It is not intended to diagnose MRSA infection nor to guide or monitor treatment for MRSA infections. Performed at [facility], [address] [place], [place] [address]     Coagulation Studies: No results for input(s): LABPROT, INR in the last 72 hours.  Urinalysis: No results for input(s): COLORURINE, LABSPEC, PHURINE, GLUCOSEU, HGBUR, BILIRUBINUR, KETONESUR, PROTEINUR, UROBILINOGEN, NITRITE, LEUKOCYTESUR in the last 72 hours.  Invalid input(s): APPERANCEUR    Imaging: Dg Chest 2 View  Result Date: 06/08/2018 CLINICAL DATA:  Increasing shortness of breath EXAM: CHEST - 2 VIEW COMPARISON:  06/04/2018 FINDINGS: Cardiac shadow is enlarged but stable. Dialysis catheter is again seen. Central vascular congestion is noted roughly similar to that seen on the prior exam. No sizable effusion is noted. Mild thickening of the fissures is seen. No acute bony abnormality is noted. IMPRESSION: Increased vascular congestion with mild edema similar to that seen on the prior  exam. Electronically Signed   By: Inez Catalina M.D.   On: 06/08/2018 13:41     Medications:    . [START ON 06/09/2018] Chlorhexidine Gluconate Cloth  6 each Topical Q0600     Assessment/ Plan:  [name] is a 48 y.o. black male with end stage renal disease on hemodialysis, hypertension, CVA, sleep apnea, coronary artery disease, COPD, anti-social personality disorder.   1. Hyperkalemia:  2. Pulmonary edema 3. End Stage Renal Disease on hemodialysis 4.  Hypertension  Placed on hemodialysis with 2K bath. Will discharge after dialysis treatment    LOS: 0 [place] 4/8/20203:25 PM

## 2018-06-08 NOTE — Progress Notes (Signed)
Pre HD TX   06/08/18 1456  Vital Signs  Temp 97.8 F (36.6 C)  Temp Source Oral  Pulse Rate 80  Pulse Rate Source Monitor  Resp 14  BP (!) 201/134  BP Location Right Arm  BP Method Automatic  Patient Position (if appropriate) Sitting  Oxygen Therapy  SpO2 98 %  O2 Device Room Air  Pulse Oximetry Type Continuous  Pain Assessment  Pain Scale 0-10  Pain Score 0  Dialysis Weight  Weight 75.1 kg  Type of Weight Pre-Dialysis  Time-Out for Hemodialysis  What Procedure? HD  Pt Identifiers(min of two) First/Last Name;MRN/Account#  Correct Site? Yes  Correct Side? Yes  Correct Procedure? Yes  Consents Verified? Yes  Rad Studies Available? N/A  Engineer, civil (consulting) Number 1  Station Number 3  UF/Alarm Test Passed  Conductivity: Meter 14.2  Conductivity: Machine  14.1  pH 7.2  Reverse Osmosis Main  Normal Saline Lot Number [id number]  Dialyzer Lot Number [id number]  Disposable Set Lot Number [id number]  Machine Temperature 98.6 F (37 C)  Musician and Audible Yes  Blood Lines Intact and Secured Yes  Pre Treatment Patient Checks  Vascular access used during treatment Catheter  Patient is receiving dialysis in a chair Yes  Hepatitis B Surface Antigen Results Negative  Date Hepatitis B Surface Antigen Drawn 05/25/18  Isolation Initiated Yes  Hepatitis B Surface Antibody  (<10)  Date Hepatitis B Surface Antibody Drawn 05/25/18  Hemodialysis Consent Verified Yes  Hemodialysis Standing Orders Initiated Yes  ECG (Telemetry) Monitor On Yes  Prime Ordered Normal Saline  Length of  DialysisTreatment -hour(s) 3.5 Hour(s)  Dialysis Treatment Comments Na 140  Dialyzer Elisio 17H NR  Dialysate 2K, 2.5 Ca  Dialysis Anticoagulant Heparin  Dialysate Flow Ordered 600  Blood Flow Rate Ordered 400 mL/min  Ultrafiltration Goal 3.5 Liters (Pt states per EDW only needs 1.5 K, MD aware )  Dialysis Blood Pressure Support Ordered Normal Saline  Education / Care Plan  Dialysis  Education Provided Yes  Documented Education in Care Plan Yes  Hemodialysis Catheter Right Subclavian Double-lumen  Placement Date/Time: (c) 02/10/17 1343   Placed prior to admission: Yes  Orientation: Right  Access Location: Subclavian  Hemodialysis Catheter Type: Double-lumen  Site Condition No complications  Blue Lumen Status Flushed  Red Lumen Status Flushed  Dressing Status Clean;Dry;Intact

## 2018-06-08 NOTE — ED Notes (Signed)
Pt provided with a meal tray after MD verbalized pt could eat and drink.

## 2018-06-08 NOTE — Progress Notes (Signed)
Pre HD Assessment    06/08/18 1445  Neurological  Level of Consciousness Alert  Orientation Level Oriented X4  Respiratory  Respiratory Pattern Labored  Chest Assessment Chest expansion symmetrical  Bilateral Breath Sounds Diminished  Cough None  Cardiac  Pulse Regular  Heart Sounds S1, S2  ECG Monitor Yes  Cardiac Rhythm NSR  Ectopy Multifocal PVC's  Vascular  R Radial Pulse +2  L Radial Pulse +2  Edema Generalized  Generalized Edema +1  Psychosocial  Psychosocial (WDL) WDL

## 2018-06-08 NOTE — ED Notes (Signed)
Patient transported to X-ray 

## 2018-06-08 NOTE — Progress Notes (Signed)
Post HD [facility]    06/08/18 1830  Hand-Off documentation  Report given to (Full Name) Odis Hollingshead, RN   Report received from (Full Name) Beatris Ship, RN   Vital Signs  Temp 97.8 F (36.6 C)  Temp Source Oral  Pulse Rate 80  Pulse Rate Source Monitor  Resp 20  BP (!) 197/109  BP Location Right Arm  BP Method Automatic  Patient Position (if appropriate) Sitting  Oxygen Therapy  SpO2 100 %  O2 Device Room Air  Pulse Oximetry Type Continuous  Pain Assessment  Pain Scale 0-10  Pain Score 0  Dialysis Weight  Weight 96.1 kg  Type of Weight Post-Dialysis  Post-Hemodialysis Assessment  Rinseback Volume (mL) 250 mL  KECN 74.9 V  Dialyzer Clearance Lightly streaked  Duration of HD Treatment -hour(s) 3.25 hour(s) (clotted system , 15 min lost )  Hemodialysis Intake (mL) 500 mL  UF Total -Machine (mL) 1508 mL  Net UF (mL) 1008 mL  Tolerated HD Treatment Yes  Hemodialysis Catheter Right Subclavian Double-lumen  Placement Date/Time: (c) 02/10/17 1343   Placed prior to admission: Yes  Orientation: Right  Access Location: Subclavian  Hemodialysis Catheter Type: Double-lumen  Site Condition No complications  Post treatment catheter status Capped and Clamped

## 2018-06-08 NOTE — ED Provider Notes (Signed)
[facility] Emergency Department Provider Note       Time seen: ----------------------------------------- 1:06 PM on 06/08/2018 -----------------------------------------   I have reviewed the triage vital signs and the nursing notes.  HISTORY   Chief Complaint Shortness of Breath    HPI Fernando Salas is a 48 y.o. male with a history of coronary artery disease, COPD, end-stage renal disease, hyperlipidemia, hypertension who presents to the ED for shortness of breath.  Patient reports he had some personal things to do and did not go to dialysis.  A few hours prior he became short of breath like when he misses dialysis.  He denies any recent illness, cough, fever or other complaints.  Past Medical History:  Diagnosis Date  . CAD (coronary artery disease)    status post multiple myocardial infarctions  . COPD (chronic obstructive pulmonary disease) ([place])   . ESRD (end stage renal disease) ([place])   . Hyperlipidemia   . Hypertension   . Myocardial infarction ([place])    " I had 13 heart attacks, 8 massive"  . Proteinuria   . Renal insufficiency   . Secondary hyperparathyroidism of renal origin ([place])   . Shortness of breath dyspnea    " when I drink a cup of cold water too fast"  . Sleep apnea    pt stated " long story" does not wear CPAP  . Stroke [facility])     Patient Active Problem List   Diagnosis Date Noted  . Hypertensive urgency, malignant 04/08/2018  . Hypertensive urgency 03/25/2018  . Pulmonary edema 03/09/2017  . 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 Fluid overload 02/08/2017  . Accelerated hypertension 01/12/2017  . AKI (acute kidney injury) ([place])   . Unstable angina ([place]) 11/01/2016    Past Surgical History:  Procedure Laterality Date  . AV FISTULA PLACEMENT Left 01-24-14   By Dr. Hortencia Pilar in [place]  . AV FISTULA PLACEMENT Right 04/30/2014   Procedure: RIGHT BRACHIOCEPHALIC ARTERIOVENOUS (AV) FISTULA CREATION;  Surgeon:  Elam Dutch, MD;  Location: [facility] OR;  Service: Vascular;  Laterality: Right;  . AV FISTULA PLACEMENT Left 05/31/2014   Procedure: Left INSERTION OF ARTERIOVENOUS (AV) GORE-TEX GRAFT ARM;  Surgeon: Elam Dutch, MD;  Location: [place];  Service: Vascular;  Laterality: Left;  . CARDIAC CATHETERIZATION    . CORONARY STENT PLACEMENT     13 stents total  . DIALYSIS/PERMA CATHETER INSERTION N/A 01/15/2017   Procedure: DIALYSIS/PERMA CATHETER INSERTION;  Surgeon: Katha Cabal, MD;  Location: [place] CV LAB;  Service: Cardiovascular;  Laterality: N/A;    Allergies Ibuprofen; Viagra [sildenafil]; and Clonidine derivatives  Social History Social History   Tobacco Use  . Smoking status: Current Every Day Smoker    Packs/day: 0.25    Years: 25.00    Pack years: 6.25    Types: Cigarettes  . Smokeless tobacco: Never Used  Substance Use Topics  . Alcohol use: No    Alcohol/week: 0.0 standard drinks    Frequency: Never    Comment: occasional  . Drug use: Not Currently    Types: Marijuana    Comment: daily   Review of Systems Constitutional: Negative for fever. Cardiovascular: Negative for chest pain. Respiratory: Positive for shortness of breath Gastrointestinal: Negative for abdominal pain, vomiting and diarrhea. Musculoskeletal: Negative for back pain. Skin: Negative for rash. Neurological: Negative for headaches, focal weakness or numbness.  All systems negative/normal/unremarkable except as stated in the HPI  ____________________________________________   PHYSICAL EXAM:  VITAL  SIGNS: ED Triage Vitals  Enc Vitals Group     BP --      Pulse Rate 06/08/18 1300 72     Resp 06/08/18 1300 18     Temp 06/08/18 1300 97.7 F (36.5 C)     Temp Source 06/08/18 1300 Axillary     SpO2 06/08/18 1300 93 %     Weight 06/08/18 1259 220 lb (99.8 kg)     Height 06/08/18 1259 6' (1.829 m)     Head Circumference --      Peak Flow --      Pain Score 06/08/18 1259 0      Pain Loc --      Pain Edu? --      Excl. in [place]? --     Constitutional: Alert and oriented. Well appearing and in no distress. Eyes: Conjunctivae are normal. Normal extraocular movements. Cardiovascular: Normal rate, regular rhythm. No murmurs, rubs, or gallops. Respiratory: Normal respiratory effort without tachypnea nor retractions.  Mild basilar rales are noted Gastrointestinal: Soft and nontender. Normal bowel sounds Musculoskeletal: Nontender with normal range of motion in extremities. No lower extremity tenderness nor edema. Neurologic:  Normal speech and language. No gross focal neurologic deficits are appreciated.  Skin:  Skin is warm, dry and intact. No rash noted. Psychiatric: Mood and affect are normal. Speech and behavior are normal.  ____________________________________________  EKG: Interpreted by me.  Sinus rhythm the rate 83 bpm, incomplete right bundle branch block, RVH, nonspecific ST segment changes  ____________________________________________  ED COURSE:  As part of my medical decision making, I reviewed the following data within the [place] History obtained from family if available, nursing notes, old chart and ekg, as well as notes from prior ED visits. Patient presented for missing dialysis and being short of breath, we will assess with labs and imaging as indicated at this time.   Procedures  Fernando Salas was evaluated in Emergency Department on 06/08/2018 for the symptoms described in the history of present illness. He was evaluated in the context of the global COVID-19 pandemic, which necessitated consideration that the patient might be at risk for infection with the SARS-CoV-2 virus that causes COVID-19. Institutional protocols and algorithms that pertain to the evaluation of patients at risk for COVID-19 are in a state of rapid change based on information released by regulatory bodies including the CDC and federal and state organizations.  These policies and algorithms were followed during the patient's care in the ED.  ____________________________________________   LABS (pertinent positives/negatives)  Labs Reviewed  CBC WITH DIFFERENTIAL/PLATELET - Abnormal; Notable for the following components:      Result Value   RBC 3.31 (*)    Hemoglobin 10.2 (*)    HCT 32.2 (*)    All other components within normal limits  TROPONIN I - Abnormal; Notable for the following components:   Troponin I 0.07 (*)    All other components within normal limits  BASIC METABOLIC PANEL    RADIOLOGY Images were viewed by me Chest x-ray IMPRESSION: Increased vascular congestion with mild edema similar to that seen on the prior exam.   ____________________________________________   DIFFERENTIAL DIAGNOSIS   End-stage renal disease on dialysis, electrolyte abnormality, volume overload, pulmonary edema, CHF, MI  FINAL ASSESSMENT AND PLAN  End-stage renal disease, chronic troponin elevation   Plan: The patient had presented for shortness of breath after missing dialysis today. Patient's labs look as expected having missed dialysis today. Patient's imaging revealed  mild edema.  I have discussed with nephrology who will arrange for dialysis and then discharge home.   Laurence Aly, MD    Note: This note was generated in part or whole with voice recognition software. Voice recognition is usually quite accurate but there are transcription errors that can and very often do occur. I apologize for any typographical errors that were not detected and corrected.     Earleen Newport, MD 06/08/18 [phone]

## 2018-06-10 ENCOUNTER — Encounter: Payer: Self-pay | Admitting: Emergency Medicine

## 2018-06-10 ENCOUNTER — Emergency Department
Admission: EM | Admit: 2018-06-10 | Discharge: 2018-06-10 | Disposition: A | Discharge status: Home / Self Care | Payer: Medicare Other | Attending: Emergency Medicine | Admitting: Emergency Medicine

## 2018-06-10 ENCOUNTER — Emergency Department: Payer: Medicare Other

## 2018-06-10 ENCOUNTER — Other Ambulatory Visit: Payer: Self-pay

## 2018-06-10 DIAGNOSIS — Z7902 Long term (current) use of antithrombotics/antiplatelets: Secondary | ICD-10-CM | POA: Insufficient documentation

## 2018-06-10 DIAGNOSIS — J449 Chronic obstructive pulmonary disease, unspecified: Secondary | ICD-10-CM | POA: Diagnosis not present

## 2018-06-10 DIAGNOSIS — Z79899 Other long term (current) drug therapy: Secondary | ICD-10-CM | POA: Insufficient documentation

## 2018-06-10 DIAGNOSIS — E8779 Other fluid overload: Secondary | ICD-10-CM | POA: Diagnosis not present

## 2018-06-10 DIAGNOSIS — F17210 Nicotine dependence, cigarettes, uncomplicated: Secondary | ICD-10-CM | POA: Insufficient documentation

## 2018-06-10 DIAGNOSIS — I25110 Atherosclerotic heart disease of native coronary artery with unstable angina pectoris: Secondary | ICD-10-CM | POA: Insufficient documentation

## 2018-06-10 DIAGNOSIS — Z8673 Personal history of transient ischemic attack (TIA), and cerebral infarction without residual deficits: Secondary | ICD-10-CM | POA: Insufficient documentation

## 2018-06-10 DIAGNOSIS — I120 Hypertensive chronic kidney disease with stage 5 chronic kidney disease or end stage renal disease: Secondary | ICD-10-CM | POA: Diagnosis not present

## 2018-06-10 DIAGNOSIS — N186 End stage renal disease: Secondary | ICD-10-CM | POA: Insufficient documentation

## 2018-06-10 DIAGNOSIS — R0602 Shortness of breath: Secondary | ICD-10-CM | POA: Diagnosis present

## 2018-06-10 DIAGNOSIS — I10 Essential (primary) hypertension: Secondary | ICD-10-CM

## 2018-06-10 LAB — CBC WITH DIFFERENTIAL/PLATELET
Abs Immature Granulocytes: 0.02 10*3/uL (ref 0.00–0.07)
Basophils Absolute: 0 10*3/uL (ref 0.0–0.1)
Basophils Relative: 0 %
Eosinophils Absolute: 0.2 10*3/uL (ref 0.0–0.5)
Eosinophils Relative: 2 %
HCT: 32.4 % — ABNORMAL LOW (ref 39.0–52.0)
Hemoglobin: 10.4 g/dL — ABNORMAL LOW (ref 13.0–17.0)
Immature Granulocytes: 0 %
Lymphocytes Relative: 17 %
Lymphs Abs: 1.4 10*3/uL (ref 0.7–4.0)
MCH: 31.5 pg (ref 26.0–34.0)
MCHC: 32.1 g/dL (ref 30.0–36.0)
MCV: 98.2 fL (ref 80.0–100.0)
Monocytes Absolute: 0.7 10*3/uL (ref 0.1–1.0)
Monocytes Relative: 9 %
Neutro Abs: 5.8 10*3/uL (ref 1.7–7.7)
Neutrophils Relative %: 72 %
Platelets: 206 10*3/uL (ref 150–400)
RBC: 3.3 MIL/uL — ABNORMAL LOW (ref 4.22–5.81)
RDW: 15.9 % — ABNORMAL HIGH (ref 11.5–15.5)
WBC: 8 10*3/uL (ref 4.0–10.5)
nRBC: 0 % (ref 0.0–0.2)

## 2018-06-10 LAB — BASIC METABOLIC PANEL
Anion gap: 18 — ABNORMAL HIGH (ref 5–15)
BUN: 51 mg/dL — ABNORMAL HIGH (ref 6–20)
CO2: 23 mmol/L (ref 22–32)
Calcium: 9.2 mg/dL (ref 8.9–10.3)
Chloride: 100 mmol/L (ref 98–111)
Creatinine, Ser: 11.83 mg/dL — ABNORMAL HIGH (ref 0.61–1.24)
GFR calc Af Amer: 5 mL/min — ABNORMAL LOW (ref >60)
GFR calc non Af Amer: 5 mL/min — ABNORMAL LOW (ref >60)
Glucose, Bld: 100 mg/dL — ABNORMAL HIGH (ref 70–99)
Potassium: 4.5 mmol/L (ref 3.5–5.1)
Sodium: 141 mmol/L (ref 135–145)

## 2018-06-10 LAB — BRAIN NATRIURETIC PEPTIDE: B Natriuretic Peptide: >4500 pg/mL — ABNORMAL HIGH (ref 0.0–100.0)

## 2018-06-10 LAB — TROPONIN I: Troponin I: 0.06 ng/mL (ref <0.03)

## 2018-06-10 MED ORDER — HYDRALAZINE HCL 50 MG PO TABS
100.0000 mg | ORAL_TABLET | Freq: Once | ORAL | Status: AC
  Administered 2018-06-10: 100 mg via ORAL
  Filled 2018-06-10: qty 2

## 2018-06-10 MED ORDER — MORPHINE SULFATE (PF) 2 MG/ML IV SOLN
2.0000 mg | Freq: Once | INTRAVENOUS | Status: AC
  Administered 2018-06-10: 2 mg via INTRAVENOUS

## 2018-06-10 MED ORDER — SODIUM CHLORIDE 0.9 % IV SOLN
Freq: Once | INTRAVENOUS | Status: AC
  Administered 2018-06-10: 12:00:00 via INTRAVENOUS

## 2018-06-10 MED ORDER — HYDRALAZINE HCL 50 MG PO TABS: 100.0000 mg | ORAL_TABLET | Freq: Once | ORAL | Status: DC

## 2018-06-10 MED ORDER — MORPHINE SULFATE (PF) 2 MG/ML IV SOLN
INTRAVENOUS | Status: AC
  Filled 2018-06-10: qty 1

## 2018-06-10 MED ORDER — AMLODIPINE BESYLATE 5 MG PO TABS: 10.0000 mg | ORAL_TABLET | Freq: Once | ORAL | Status: DC

## 2018-06-10 MED ORDER — NITROGLYCERIN 2 % TD OINT
1.0000 [in_us] | TOPICAL_OINTMENT | Freq: Once | TRANSDERMAL | Status: AC
  Administered 2018-06-10: 1 [in_us] via TOPICAL
  Filled 2018-06-10: qty 1

## 2018-06-10 MED ORDER — ONDANSETRON HCL 4 MG/2ML IJ SOLN
INTRAMUSCULAR | Status: AC
  Filled 2018-06-10: qty 2

## 2018-06-10 MED ORDER — ISOSORBIDE DINITRATE 30 MG PO TABS: 30.0000 mg | ORAL_TABLET | Freq: Once | ORAL | Status: DC

## 2018-06-10 MED ORDER — CHLORHEXIDINE GLUCONATE CLOTH 2 % EX PADS: 6.0000 | MEDICATED_PAD | Freq: Every day | CUTANEOUS | Status: DC

## 2018-06-10 MED ORDER — ONDANSETRON HCL 4 MG/2ML IJ SOLN
4.0000 mg | Freq: Once | INTRAMUSCULAR | Status: AC
  Administered 2018-06-10: 4 mg via INTRAVENOUS

## 2018-06-10 MED ORDER — CARVEDILOL 25 MG PO TABS: 25.0000 mg | ORAL_TABLET | Freq: Two times a day (BID) | ORAL | Status: DC

## 2018-06-10 NOTE — Progress Notes (Signed)
Pre HD Tx     06/10/18 1400  Neurological  Level of Consciousness Alert  Orientation Level Oriented X4  Respiratory  Respiratory Pattern Regular  Chest Assessment Chest expansion symmetrical  Bilateral Breath Sounds Clear;Diminished  Cardiac  Pulse Regular  Heart Sounds S1, S2  ECG Monitor Yes  Cardiac Rhythm NSR  Ectopy Multifocal PVC's  Ectopy Frequency Occasional  Vascular  R Radial Pulse +2  L Radial Pulse +2  Edema Generalized  Generalized Edema +1  Psychosocial  Psychosocial (WDL) WDL

## 2018-06-10 NOTE — Progress Notes (Signed)
Post HD Assessment  2.5 liters removed, tolerated well. BP elevated, ED RN and MD aware -- pt to receive additional BP meds in ED.     06/10/18 1800  Neurological  Level of Consciousness Alert  Orientation Level Oriented X4  Respiratory  Respiratory Pattern Regular  Chest Assessment Chest expansion symmetrical  Bilateral Breath Sounds Clear;Diminished  Cardiac  Pulse Regular  Heart Sounds S1, S2  ECG Monitor Yes  Cardiac Rhythm NSR  Ectopy Multifocal PVC's  Ectopy Frequency Occasional  Vascular  R Radial Pulse +2  L Radial Pulse +2  Edema Generalized  Generalized Edema +1  Psychosocial  Psychosocial (WDL) WDL

## 2018-06-10 NOTE — ED Provider Notes (Signed)
[facility] Emergency Department Provider Note   ____________________________________________   First MD Initiated Contact with Patient 06/10/18 1121     (approximate)  I have reviewed the triage vital signs and the nursing notes.   HISTORY  Chief Complaint Shortness of Breath    HPI MARVIN GRABILL is a 48 y.o. male who reports he got very short of breath.  He did not think he could make it to [facility] for dialysis so he came here.  He has not had any of his blood pressure medicines today either and does not remember what they are.  Review of the old records shows he takes Coreg amlodipine and hydralazine.  We will start by giving him hydralazine for his blood pressure.         Past Medical History:  Diagnosis Date  . CAD (coronary artery disease)    status post multiple myocardial infarctions  . COPD (chronic obstructive pulmonary disease) ([place])   . ESRD (end stage renal disease) ([place])   . Hyperlipidemia   . Hypertension   . Myocardial infarction ([place])    " I had 13 heart attacks, 8 massive"  . Proteinuria   . Renal insufficiency   . Secondary hyperparathyroidism of renal origin ([place])   . Shortness of breath dyspnea    " when I drink a cup of cold water too fast"  . Sleep apnea    pt stated " long story" does not wear CPAP  . Stroke [facility])     Patient Active Problem List   Diagnosis Date Noted  . Hypertensive urgency, malignant 04/08/2018  . Hypertensive urgency 03/25/2018  . Pulmonary edema 03/09/2017  . 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 Fluid overload 02/08/2017  . Accelerated hypertension 01/12/2017  . AKI (acute kidney injury) ([place])   . Unstable angina ([place]) 11/01/2016    Past Surgical History:  Procedure Laterality Date  . AV FISTULA PLACEMENT Left 01-24-14   By Dr. Hortencia Pilar in [place]  . AV FISTULA PLACEMENT Right 04/30/2014   Procedure: RIGHT BRACHIOCEPHALIC ARTERIOVENOUS (AV) FISTULA CREATION;  Surgeon: Elam Dutch, MD;  Location: [facility] OR;  Service: Vascular;  Laterality: Right;  . AV FISTULA PLACEMENT Left 05/31/2014   Procedure: Left INSERTION OF ARTERIOVENOUS (AV) GORE-TEX GRAFT ARM;  Surgeon: Elam Dutch, MD;  Location: [place];  Service: Vascular;  Laterality: Left;  . CARDIAC CATHETERIZATION    . CORONARY STENT PLACEMENT     13 stents total  . DIALYSIS/PERMA CATHETER INSERTION N/A 01/15/2017   Procedure: DIALYSIS/PERMA CATHETER INSERTION;  Surgeon: Katha Cabal, MD;  Location: [place] CV LAB;  Service: Cardiovascular;  Laterality: N/A;    Prior to Admission medications   Medication Sig Start Date End Date Taking? Authorizing Provider  amLODipine (NORVASC) 10 MG tablet Take 1 tablet (10 mg total) by mouth daily. 05/01/17 06/08/18  Lisa Roca, MD  atorvastatin (LIPITOR) 40 MG tablet Take 1 tablet (40 mg total) by mouth daily. 11/03/16   Demetrios Loll, MD  calcium acetate (PHOSLO) 667 MG capsule Take 1,334 mg by mouth 3 (three) times daily with meals. 12/03/17 12/03/18  [provider]  carvedilol (COREG) 25 MG tablet Take 1 tablet (25 mg total) by mouth 2 (two) times daily. 05/01/17 06/08/18  Lisa Roca, MD  clopidogrel (PLAVIX) 75 MG tablet Take 1 tablet (75 mg total) by mouth daily. 11/03/16   Demetrios Loll, MD  furosemide (LASIX) 80 MG tablet Take 80 mg by mouth See admin  instructions. Take 1 tablet (80mg ) every morning on non-dialysis days 05/16/18   [provider]  hydrALAZINE (APRESOLINE) 100 MG tablet Take 1 tablet (100 mg total) by mouth 4 (four) times daily. Patient taking differently: Take 100 mg by mouth every 8 (eight) hours.  05/01/17 06/08/18  Lisa Roca, MD  isosorbide dinitrate (ISORDIL) 30 MG tablet Take 30 mg by mouth 3 (three) times daily.     [provider]  Multiple Vitamin (MULTIVITAMIN) tablet Take 1 tablet by mouth daily. 04/10/18   Henreitta Leber, MD    Allergies Ibuprofen; Viagra [sildenafil]; and Clonidine derivatives  Family History   Problem Relation Age of Onset  . Heart disease Mother   . Hyperlipidemia Mother   . Hypertension Mother   . Kidney disease Father   . Kidney disease Brother   . Hyperlipidemia Sister   . Hyperlipidemia Daughter   . Hypertension Sister   . Hypertension Daughter     Social History Social History   Tobacco Use  . Smoking status: Current Every Day Smoker    Packs/day: 0.25    Years: 25.00    Pack years: 6.25    Types: Cigarettes  . Smokeless tobacco: Never Used  Substance Use Topics  . Alcohol use: No    Alcohol/week: 0.0 standard drinks    Frequency: Never    Comment: occasional  . Drug use: Not Currently    Types: Marijuana    Comment: daily    Review of Systems  Constitutional: No fever/chills Eyes: No visual changes. ENT: No sore throat. Cardiovascular: Denies chest pain. Respiratory:  shortness of breath. Gastrointestinal: No abdominal pain.  No nausea, no vomiting.  No diarrhea.  No constipation. Genitourinary: Negative for dysuria. Musculoskeletal: Negative for back pain. Skin: Negative for rash. Neurological: Negative for headaches, focal weakness  ____________________________________________   PHYSICAL EXAM:  VITAL SIGNS: ED Triage Vitals  Enc Vitals Group     BP 06/10/18 1101 (!) 217/139     Pulse Rate 06/10/18 1101 88     Resp 06/10/18 1101 18     Temp --      Temp src --      SpO2 06/10/18 1101 96 %     Weight 06/10/18 1102 211 lb 13.8 oz (96.1 kg)     Height 06/10/18 1102 6' (1.829 m)     Head Circumference --      Peak Flow --      Pain Score 06/10/18 1101 0     Pain Loc --      Pain Edu? --      Excl. in [place]? --     Constitutional: Alert and oriented. Well appearing and in no acute distress. Eyes: Conjunctivae are normal.I. Head: Atraumatic. Nose: No congestion/rhinnorhea. Mouth/Throat: Mucous membranes are moist.  Oropharynx non-erythematous. Neck: No stridor.  Cardiovascular: Normal rate, regular rhythm. Grossly normal heart  sounds.  Good peripheral circulation. Respiratory: Normal respiratory effort.  No retractions. Lungs CTAB. Gastrointestinal: Soft and nontender. No distention. No abdominal bruits. No CVA tenderness. Musculoskeletal: No lower extremity tenderness nor edema.   Neurologic:  Normal speech and language. No gross focal neurologic deficits are appreciated. y. Skin:  Skin is warm, dry and intact. No rash noted. Psychiatric: Mood and affect are normal. Speech and behavior are normal.  ____________________________________________   LABS (all labs ordered are listed, but only abnormal results are displayed)  Labs Reviewed  BASIC METABOLIC PANEL  CBC WITH DIFFERENTIAL/PLATELET  TROPONIN I  BRAIN NATRIURETIC PEPTIDE  ____________________________________________  EKG  EKG read interpreted by me shows normal sinus rhythm rate of 84 left axis if there are flipped T waves in 1 and anterolaterally and also in V6. _This is similar to an EKG from 4 April of this year.  ___________________________________________  [place]  ED MD interpretation:   Official radiology report(s): No results found.  ____________________________________________   PROCEDURES  Procedure(s) performed (including Critical Care):  Procedures   ____________________________________________   INITIAL IMPRESSION / ASSESSMENT AND PLAN / ED COURSE  Patient needs dialysis but there are 2 people from ER ahead of him already.  We will work on getting his blood pressure down and see if we can temporize the situation.  He is not in extremis at this time.   ----------------------------------------- 1:18 PM on 06/10/2018 -----------------------------------------  Patient complains of sharp stabbing pain in the middle of his chest "like a heart attack" EKG was repeated is exactly the same as the one previous to the chest pain.  Patient's pain is exactly reproduced by palpation over the costochondral junctions.  Initial  troponin prior to chest pain was lower than previously.  We will give him some morphine and Zofran a repeat EKG again in a few minutes.  ----------------------------------------- 1:52 PM on 06/10/2018 -----------------------------------------  EKG done a few minutes ago read and interpreted by me is the same as the first 2 EKG sinus rhythm at 86 left axis flipped T's in 1 and L and V6.  Really no obvious changes between any of them and same as the one from fourth of this month.   Blood pressure has come down somewhat.  Patient is feeling better now.  Will continue to monitor him.  Clinical Course as of Jun 10 1526  Fri Jun 10, 2018  1416 BUN(!): 51 [PM]    Clinical Course User Index [PM] Nena Polio, MD     ____________________________________________   FINAL CLINICAL IMPRESSION(S) / ED DIAGNOSES  Final diagnoses:  Other hypervolemia  Needs dialysis   ED Discharge Orders    None       Note:  This document was prepared using Dragon voice recognition software and may include unintentional dictation errors.    Nena Polio, MD 06/10/18 [phone]

## 2018-06-10 NOTE — Discharge Instructions (Signed)
Be sure to take all your medicines as you are supposed to.  Especially take your doses of medication as soon as you get home.  I gave said to you your extreme risk for stroke with your blood pressure being this high.

## 2018-06-10 NOTE — ED Provider Notes (Signed)
Patient returns from dialysis.  2 L were removed.  He feels okay.  However his blood pressure still elevated.  He has not had his medications except for the 1 dose of hydralazine that we gave him in the emergency room all day.  We will give him his usual medication Physical exam Well-developed well-nourished male alert oriented x3 Head normocephalic atraumatic Heart regular rate and rhythm no audible murmurs Lungs are clear Edematous soft and nontender Assessment and plan patient refuses to take his medicines here.  He says he will go take his medicines at home.  He promises me he will do this.  I have warned him he is at risk of stroke at any time with his blood pressure being this high.  He understands this but says he wants to take his medicine at home.  He has it I will let him do this.  It is his right to do so.. Diagnosis hypertension postdialysis   Nena Polio, MD 06/10/18 [phone]

## 2018-06-10 NOTE — Progress Notes (Signed)
Notified ED RN, who agreed to notify patient, that we will not be able to take him down for dialysis for another 3 hours (d/t max of 2 pts already in HD unit receiving tx at this time).

## 2018-06-10 NOTE — Progress Notes (Signed)
HD Tx End   06/10/18 1715  Vital Signs  Pulse Rate 78  Resp 20  BP (!) 189/109  Oxygen Therapy  SpO2 95 %  During Hemodialysis Assessment  Blood Flow Rate (mL/min) 200 mL/min  Arterial Pressure (mmHg) -170 mmHg  Venous Pressure (mmHg) 210 mmHg  Transmembrane Pressure (mmHg) 60 mmHg  Ultrafiltration Rate (mL/min) 860 mL/min  Dialysate Flow Rate (mL/min) 600 ml/min  Conductivity: Machine  14  HD Safety Checks Performed Yes  Dialysis Fluid Bolus Normal Saline  Bolus Amount (mL) 250 mL  Intra-Hemodialysis Comments Tx completed

## 2018-06-10 NOTE — Progress Notes (Signed)
Post HD Tx   06/10/18 1730  Hand-Off documentation  Report given to (Full Name) Remo Lipps RN ED  Report received from (Full Name) Trellis Paganini RN  Vital Signs  Temp 98 F (36.7 C)  Temp Source Oral  Pulse Rate 82  Pulse Rate Source Monitor  Resp (!) 21  BP (!) 188/121  BP Location Right Arm  BP Method Automatic  Patient Position (if appropriate) Sitting  Oxygen Therapy  SpO2 94 %  O2 Device Room Air  Pain Assessment  Pain Scale 0-10  Pain Score 0  Post-Hemodialysis Assessment  Rinseback Volume (mL) 250 mL  Dialyzer Clearance Lightly streaked  Duration of HD Treatment -hour(s) 3.5 hour(s)  Hemodialysis Intake (mL) 500 mL  UF Total -Machine (mL) 3000 mL  Net UF (mL) 2500 mL  Tolerated HD Treatment Yes  Hemodialysis Catheter Right Subclavian Double-lumen  Placement Date/Time: (c) 02/10/17 1343   Placed prior to admission: Yes  Orientation: Right  Access Location: Subclavian  Hemodialysis Catheter Type: Double-lumen  Site Condition No complications  Blue Lumen Status Flushed;Saline locked;Capped (Central line);Heparin locked  Red Lumen Status Flushed;Saline locked;Capped (Central line);Heparin locked  Catheter fill solution Heparin 1000 units/ml  Dressing Type Occlusive  Dressing Status Clean;Dry;Intact

## 2018-06-10 NOTE — ED Triage Notes (Signed)
Says onset shortness of breath started last night.  Says he needs dialysis.  Tried to go to dialysis, but didn't think he could make it to [place].  No fever or cough.

## 2018-06-10 NOTE — Progress Notes (Signed)
Pre HD Tx   06/10/18 1415  Hand-Off documentation  Report given to (Full Name) Trellis Paganini RN  Report received from (Full Name) Debby Freiberg RN  Vital Signs  Temp 97.9 F (36.6 C)  Temp Source Oral  Pulse Rate 85  Pulse Rate Source Monitor  Resp 20  BP (!) 181/120  BP Location Right Arm  BP Method Automatic  Patient Position (if appropriate) Sitting  Oxygen Therapy  SpO2 95 %  O2 Device Room Air  Pain Assessment  Pain Scale 0-10  Pain Score 0  Dialysis Weight  Weight 96.1 kg  Type of Weight Pre-Dialysis  Time-Out for Hemodialysis  What Procedure? Hemodialysis   Pt Identifiers(min of two) First/Last Name;MRN/Account#  Correct Site? Yes  Correct Side? Yes  Correct Procedure? Yes  Consents Verified? Yes  Rad Studies Available? N/A  Safety Precautions Reviewed? Yes  Engineer, civil (consulting) Number 1  Station Number 3  UF/Alarm Test Passed  Conductivity: Meter 14  Conductivity: Machine  14  pH 7.4  Reverse Osmosis Main   Normal Saline Lot Number [id number]  Dialyzer Lot Number [id number]  Disposable Set Lot Number [id number]  Machine Temperature 98.6 F (37 C)  Musician and Audible Yes  Blood Lines Intact and Secured Yes  Pre Treatment Patient Checks  Vascular access used during treatment Catheter  Patient is receiving dialysis in a chair Yes  Hepatitis B Surface Antigen Results Negative  Date Hepatitis B Surface Antigen Drawn 05/25/18  Hepatitis B Surface Antibody  (<10)  Date Hepatitis B Surface Antibody Drawn 05/25/18  Hemodialysis Consent Verified Yes  Hemodialysis Standing Orders Initiated Yes  ECG (Telemetry) Monitor On Yes  Prime Ordered Normal Saline  Length of  DialysisTreatment -hour(s) 3 Hour(s)  Dialysis Treatment Comments Na 140  Dialyzer Elisio 17H NR  Dialysate 3K, 2.5 Ca  Dialysate Flow Ordered 600  Blood Flow Rate Ordered 400 mL/min  Ultrafiltration Goal 2.5 Liters  Dialysis Blood Pressure Support Ordered Normal Saline  Education /  Care Plan  Dialysis Education Provided Yes  Documented Education in Care Plan Yes  Hemodialysis Catheter Right Subclavian Double-lumen  Placement Date/Time: (c) 02/10/17 1343   Placed prior to admission: Yes  Orientation: Right  Access Location: Subclavian  Hemodialysis Catheter Type: Double-lumen  Site Condition No complications  Blue Lumen Status Capped (Central line)  Red Lumen Status Capped (Central line)  Purple Lumen Status N/A  Catheter fill volume (Arterial) 2.4 cc  Catheter fill volume (Venous) 2.4  Dressing Type Occlusive  Dressing Status Clean;Dry;Intact  Interventions Other (Comment) (pt refusing dressing change)  Drainage Description None

## 2018-06-10 NOTE — ED Notes (Signed)
First Nurse Note: Patient states he needs dialysis, states he usually goes to [facility] but feels [facility] and "scared to get on that road".  Placed in [place].  Alert and oriented, speaking in full sentences.

## 2018-06-10 NOTE — ED Notes (Signed)
Pt hitting call bell, states having mid chest pain, tender upon palpation, MD notified and at bedside and VORB placed.

## 2018-06-10 NOTE — ED Notes (Signed)
Date and time results received: 06/10/18 12:33 PM    Test: Troponin Critical Value: 0.06  Name of Provider Notified: Cinda Quest

## 2018-06-10 NOTE — ED Notes (Signed)
Pt refusing medication here, wants to take his at home.  MD made aware and spoke with patient about risks.

## 2018-06-10 NOTE — Progress Notes (Signed)
HD Tx Start   06/10/18 1420  Vital Signs  Pulse Rate 82  Resp (!) 21  BP (!) 191/98  Oxygen Therapy  SpO2 94 %  During Hemodialysis Assessment  Blood Flow Rate (mL/min) 400 mL/min  Arterial Pressure (mmHg) -180 mmHg  Venous Pressure (mmHg) 200 mmHg  Transmembrane Pressure (mmHg) 50 mmHg  Ultrafiltration Rate (mL/min) 860 mL/min (860 mL removed per HOUR)  Dialysate Flow Rate (mL/min) 600 ml/min  Conductivity: Machine  14  HD Safety Checks Performed Yes  Dialysis Fluid Bolus Normal Saline  Bolus Amount (mL) 250 mL  Intra-Hemodialysis Comments Tx initiated  Hemodialysis Catheter Right Subclavian Double-lumen  Placement Date/Time: (c) 02/10/17 1343   Placed prior to admission: Yes  Orientation: Right  Access Location: Subclavian  Hemodialysis Catheter Type: Double-lumen  Site Condition No complications  Blue Lumen Status Infusing  Red Lumen Status Infusing  Dressing Type Occlusive  Dressing Status Clean;Dry;Intact  Interventions  (pt refused dressing change)

## 2018-06-10 NOTE — Progress Notes (Signed)
[facility]  ROUNDING NOTE   Subjective:   [name] did not get his scheduled hemodialysis treatment today at [facility]  Patient presents with shortness of breath asking for dialysis treatment.   Objective:  Vital signs in last 24 hours:  Pulse Rate:  [78-88] 85 (04/10 1302) Resp:  [16-18] 16 (04/10 1302) BP: (175-217)/(100-139) 180/115 (04/10 1302) SpO2:  [95 %-99 %] 99 % (04/10 1302) Weight:  [96.1 kg] 96.1 kg (04/10 1102)  Weight change:  Filed Weights   06/10/18 1102  Weight: 96.1 kg    Intake/Output: No intake/output data recorded.   Intake/Output this shift:  No intake/output data recorded.  Physical Exam: General: NAD,   Head: Normocephalic, atraumatic. Moist oral mucosal membranes  Eyes: Anicteric,   Neck: Supple, trachea midline  Lungs:  Bilateral basilar crackles  Heart: Regular rate and rhythm  Abdomen:  Soft, nontender,   Extremities:  no peripheral edema.  Neurologic: Nonfocal, moving all four extremities  Skin: No lesions  Access: RIJ permcath    Basic Metabolic Panel: Recent Labs  Lab 06/04/18 1200 06/08/18 1303 06/10/18 1156  NA 140 140 141  K 6.0* 5.3* 4.5  CL 106 105 100  CO2 17* 22 23  GLUCOSE 87 92 100*  BUN 65* 48* 51*  CREATININE 14.96* 11.69* 11.83*  CALCIUM 8.8* 8.9 9.2    Liver Function Tests: No results for input(s): AST, ALT, ALKPHOS, BILITOT, PROT, ALBUMIN in the last 168 hours. No results for input(s): LIPASE, AMYLASE in the last 168 hours. No results for input(s): AMMONIA in the last 168 hours.  CBC: Recent Labs  Lab 06/04/18 1200 06/08/18 1303 06/10/18 1156  WBC 7.2 6.6 8.0  NEUTROABS  --  4.3 5.8  HGB 10.0* 10.2* 10.4*  HCT 33.9* 32.2* 32.4*  MCV 104.6* 97.3 98.2  PLT 171 202 206    Cardiac Enzymes: Recent Labs  Lab 06/04/18 1200 06/08/18 1303 06/10/18 1156  TROPONINI 0.07* 0.07* 0.06*    BNP: Invalid input(s): POCBNP  CBG: No results for input(s): GLUCAP in the last  168 hours.  Microbiology: Results for orders placed or performed during the hospital encounter of 04/08/18  MRSA PCR Screening     Status: None   Collection Time: 04/09/18  4:03 PM  Result Value Ref Range Status   MRSA by PCR NEGATIVE NEGATIVE Final    Comment:        The GeneXpert MRSA Assay (FDA approved for NASAL specimens only), is one component of a comprehensive MRSA colonization surveillance program. It is not intended to diagnose MRSA infection nor to guide or monitor treatment for MRSA infections. Performed at [facility], [address] [place], [place] [address]     Coagulation Studies: No results for input(s): LABPROT, INR in the last 72 hours.  Urinalysis: No results for input(s): COLORURINE, LABSPEC, PHURINE, GLUCOSEU, HGBUR, BILIRUBINUR, KETONESUR, PROTEINUR, UROBILINOGEN, NITRITE, LEUKOCYTESUR in the last 72 hours.  Invalid input(s): APPERANCEUR    Imaging: Dg Chest 2 View  Result Date: 06/08/2018 CLINICAL DATA:  Increasing shortness of breath EXAM: CHEST - 2 VIEW COMPARISON:  06/04/2018 FINDINGS: Cardiac shadow is enlarged but stable. Dialysis catheter is again seen. Central vascular congestion is noted roughly similar to that seen on the prior exam. No sizable effusion is noted. Mild thickening of the fissures is seen. No acute bony abnormality is noted. IMPRESSION: Increased vascular congestion with mild edema similar to that seen on the prior exam. Electronically Signed   By:  Inez Catalina M.D.   On: 06/08/2018 13:41   Dg Chest Portable 1 View  Result Date: 06/10/2018 CLINICAL DATA:  Shortness of Breath EXAM: PORTABLE CHEST 1 VIEW COMPARISON:  06/08/2018 FINDINGS: Right dialysis catheter remains in place, unchanged. Cardiomegaly with vascular congestion and interstitial prominence, likely early interstitial edema. Bibasilar atelectasis. Small effusions. No acute bony abnormality. IMPRESSION: Cardiomegaly with vascular congestion and early  interstitial edema/CHF. Small effusions. Electronically Signed   By: Rolm Baptise M.D.   On: 06/10/2018 11:54     Medications:       Assessment/ Plan:  [name] is a 48 y.o. black male with end stage renal disease on hemodialysis, hypertension, CVA, sleep apnea, coronary artery disease, COPD, anti-social personality disorder.   1. Hyperkalemia:  2. Pulmonary edema 3. End Stage Renal Disease on hemodialysis 4. Hypertension 5. Anemia with chronic kidney disease  Placed on hemodialysis with 2K bath. Will discharge after dialysis treatment    LOS: 0 Krissia Schreier 4/10/20201:16 PM

## 2018-06-10 NOTE — ED Notes (Signed)
Pt transported to dialysis

## 2018-06-13 ENCOUNTER — Encounter: Payer: Self-pay | Admitting: Emergency Medicine

## 2018-06-13 ENCOUNTER — Other Ambulatory Visit: Payer: Self-pay

## 2018-06-13 ENCOUNTER — Emergency Department
Admission: EM | Admit: 2018-06-13 | Discharge: 2018-06-13 | Disposition: A | Discharge status: Home / Self Care | Payer: Medicare Other | Attending: Emergency Medicine | Admitting: Emergency Medicine

## 2018-06-13 DIAGNOSIS — I120 Hypertensive chronic kidney disease with stage 5 chronic kidney disease or end stage renal disease: Secondary | ICD-10-CM | POA: Insufficient documentation

## 2018-06-13 DIAGNOSIS — J449 Chronic obstructive pulmonary disease, unspecified: Secondary | ICD-10-CM | POA: Insufficient documentation

## 2018-06-13 DIAGNOSIS — N186 End stage renal disease: Secondary | ICD-10-CM | POA: Diagnosis not present

## 2018-06-13 DIAGNOSIS — Z992 Dependence on renal dialysis: Secondary | ICD-10-CM | POA: Insufficient documentation

## 2018-06-13 DIAGNOSIS — Z4902 Encounter for fitting and adjustment of peritoneal dialysis catheter: Secondary | ICD-10-CM | POA: Diagnosis present

## 2018-06-13 DIAGNOSIS — I2510 Atherosclerotic heart disease of native coronary artery without angina pectoris: Secondary | ICD-10-CM | POA: Diagnosis not present

## 2018-06-13 DIAGNOSIS — F17210 Nicotine dependence, cigarettes, uncomplicated: Secondary | ICD-10-CM | POA: Insufficient documentation

## 2018-06-13 LAB — CBC WITH DIFFERENTIAL/PLATELET
Abs Immature Granulocytes: 0.02 10*3/uL (ref 0.00–0.07)
Basophils Absolute: 0 10*3/uL (ref 0.0–0.1)
Basophils Relative: 0 %
Eosinophils Absolute: 0.3 10*3/uL (ref 0.0–0.5)
Eosinophils Relative: 5 %
HCT: 28.9 % — ABNORMAL LOW (ref 39.0–52.0)
Hemoglobin: 9.1 g/dL — ABNORMAL LOW (ref 13.0–17.0)
Immature Granulocytes: 0 %
Lymphocytes Relative: 18 %
Lymphs Abs: 1.2 10*3/uL (ref 0.7–4.0)
MCH: 31.4 pg (ref 26.0–34.0)
MCHC: 31.5 g/dL (ref 30.0–36.0)
MCV: 99.7 fL (ref 80.0–100.0)
Monocytes Absolute: 0.9 10*3/uL (ref 0.1–1.0)
Monocytes Relative: 13 %
Neutro Abs: 4.4 10*3/uL (ref 1.7–7.7)
Neutrophils Relative %: 64 %
Platelets: 178 10*3/uL (ref 150–400)
RBC: 2.9 MIL/uL — ABNORMAL LOW (ref 4.22–5.81)
RDW: 16.5 % — ABNORMAL HIGH (ref 11.5–15.5)
WBC: 6.9 10*3/uL (ref 4.0–10.5)
nRBC: 0 % (ref 0.0–0.2)

## 2018-06-13 LAB — BASIC METABOLIC PANEL
Anion gap: 15 (ref 5–15)
BUN: 60 mg/dL — ABNORMAL HIGH (ref 6–20)
CO2: 25 mmol/L (ref 22–32)
Calcium: 8.5 mg/dL — ABNORMAL LOW (ref 8.9–10.3)
Chloride: 101 mmol/L (ref 98–111)
Creatinine, Ser: 14.38 mg/dL — ABNORMAL HIGH (ref 0.61–1.24)
GFR calc Af Amer: 4 mL/min — ABNORMAL LOW (ref >60)
GFR calc non Af Amer: 4 mL/min — ABNORMAL LOW (ref >60)
Glucose, Bld: 98 mg/dL (ref 70–99)
Potassium: 4.9 mmol/L (ref 3.5–5.1)
Sodium: 141 mmol/L (ref 135–145)

## 2018-06-13 MED ORDER — SODIUM ZIRCONIUM CYCLOSILICATE 10 G PO PACK
10.0000 g | PACK | Freq: Once | ORAL | Status: DC
  Filled 2018-06-13: qty 1

## 2018-06-13 NOTE — ED Triage Notes (Signed)
Says he is here for dialysis.  Says does not have the money to get to [place] for dialysis. Due today

## 2018-06-13 NOTE — ED Notes (Signed)
Patient did not want to wait for [facility] to be discharged. MD notified.

## 2018-06-13 NOTE — ED Provider Notes (Signed)
[facility] Emergency Department Provider Note       Time seen: ----------------------------------------- 1:06 PM on 06/13/2018 -----------------------------------------   I have reviewed the triage vital signs and the nursing notes.  HISTORY   Chief Complaint Vascular Access Problem    HPI Fernando Salas is a 48 y.o. male with a history of coronary artery disease, COPD, end-stage renal disease, hypertension who presents to the ED for dialysis.  Patient states he is due for dialysis today, last one on Friday.  Patient states he does not have the money to go to [facility] where he received his last dialysis.  He does have some shortness of breath.  Past Medical History:  Diagnosis Date  . CAD (coronary artery disease)    status post multiple myocardial infarctions  . COPD (chronic obstructive pulmonary disease) ([place])   . ESRD (end stage renal disease) ([place])   . Hyperlipidemia   . Hypertension   . Myocardial infarction ([place])    " I had 13 heart attacks, 8 massive"  . Proteinuria   . Renal insufficiency   . Secondary hyperparathyroidism of renal origin ([place])   . Shortness of breath dyspnea    " when I drink a cup of cold water too fast"  . Sleep apnea    pt stated " long story" does not wear CPAP  . Stroke [facility])     Patient Active Problem List   Diagnosis Date Noted  . Hypertensive urgency, malignant 04/08/2018  . Hypertensive urgency 03/25/2018  . Pulmonary edema 03/09/2017  . 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 Fluid overload 02/08/2017  . Accelerated hypertension 01/12/2017  . AKI (acute kidney injury) ([place])   . Unstable angina ([place]) 11/01/2016    Past Surgical History:  Procedure Laterality Date  . AV FISTULA PLACEMENT Left 01-24-14   By Dr. Hortencia Pilar in [place]  . AV FISTULA PLACEMENT Right 04/30/2014   Procedure: RIGHT BRACHIOCEPHALIC ARTERIOVENOUS (AV) FISTULA CREATION;  Surgeon: Elam Dutch, MD;  Location: [facility] OR;   Service: Vascular;  Laterality: Right;  . AV FISTULA PLACEMENT Left 05/31/2014   Procedure: Left INSERTION OF ARTERIOVENOUS (AV) GORE-TEX GRAFT ARM;  Surgeon: Elam Dutch, MD;  Location: [place];  Service: Vascular;  Laterality: Left;  . CARDIAC CATHETERIZATION    . CORONARY STENT PLACEMENT     13 stents total  . DIALYSIS/PERMA CATHETER INSERTION N/A 01/15/2017   Procedure: DIALYSIS/PERMA CATHETER INSERTION;  Surgeon: Katha Cabal, MD;  Location: [place] CV LAB;  Service: Cardiovascular;  Laterality: N/A;    Allergies Ibuprofen; Viagra [sildenafil]; and Clonidine derivatives  Social History Social History   Tobacco Use  . Smoking status: Current Every Day Smoker    Packs/day: 0.25    Years: 25.00    Pack years: 6.25    Types: Cigarettes  . Smokeless tobacco: Never Used  Substance Use Topics  . Alcohol use: No    Alcohol/week: 0.0 standard drinks    Frequency: Never    Comment: occasional  . Drug use: Not Currently    Types: Marijuana    Comment: daily   Review of Systems Constitutional: Negative for fever. Cardiovascular: Negative for chest pain. Respiratory: Positive for shortness of breath Gastrointestinal: Negative for abdominal pain, vomiting and diarrhea. Musculoskeletal: Negative for back pain. Skin: Negative for rash. Neurological: Negative for headaches, focal weakness or numbness.  All systems negative/normal/unremarkable except as stated in the HPI  ____________________________________________   PHYSICAL EXAM:  VITAL SIGNS: ED Triage Vitals [  06/13/18 1242]  Enc Vitals Group     BP (!) 153/88     Pulse Rate 66     Resp 18     Temp 97.6 F (36.4 C)     Temp Source Axillary     SpO2 99 %     Weight 211 lb 13.8 oz (96.1 kg)     Height 6' (1.829 m)     Head Circumference      Peak Flow      Pain Score 0     Pain Loc      Pain Edu?      Excl. in [place]?    Constitutional: Alert and oriented. Well appearing and in no distress. Eyes:  Conjunctivae are normal. Normal extraocular movements. Cardiovascular: Normal rate, regular rhythm. No murmurs, rubs, or gallops. Respiratory: Normal respiratory effort without tachypnea nor retractions. Breath sounds are clear and equal bilaterally. No wheezes/rales/rhonchi. Gastrointestinal: Soft and nontender. Normal bowel sounds Musculoskeletal: Nontender with normal range of motion in extremities. No lower extremity tenderness nor edema. Neurologic:  Normal speech and language. No gross focal neurologic deficits are appreciated.  Skin:  Skin is warm, dry and intact. No rash noted. Psychiatric: Mood and affect are normal. Speech and behavior are normal.  ____________________________________________  ED COURSE:  As part of my medical decision making, I reviewed the following data within the [place] History obtained from family if available, nursing notes, old chart and ekg, as well as notes from prior ED visits. Patient presented for dialysis, we will assess with labs and imaging as indicated at this time.   Procedures  [name] was evaluated in Emergency Department on 06/13/2018 for the symptoms described in the history of present illness. He was evaluated in the context of the global COVID-19 pandemic, which necessitated consideration that the patient might be at risk for infection with the SARS-CoV-2 virus that causes COVID-19. Institutional protocols and algorithms that pertain to the evaluation of patients at risk for COVID-19 are in a state of rapid change based on information released by regulatory bodies including the CDC and federal and state organizations. These policies and algorithms were followed during the patient's care in the ED.  ____________________________________________   LABS (pertinent positives/negatives)  Labs Reviewed  CBC WITH DIFFERENTIAL/PLATELET - Abnormal; Notable for the following components:      Result Value   RBC 2.90 (*)     Hemoglobin 9.1 (*)    HCT 28.9 (*)    RDW 16.5 (*)    All other components within normal limits  BASIC METABOLIC PANEL - Abnormal; Notable for the following components:   BUN 60 (*)    Creatinine, Ser 14.38 (*)    Calcium 8.5 (*)    GFR calc non Af Amer 4 (*)    GFR calc Af Amer 4 (*)    All other components within normal limits  ___________________________________________   DIFFERENTIAL DIAGNOSIS   End-stage renal disease on dialysis, volume overload, antisocial behavior  FINAL ASSESSMENT AND PLAN  End-stage renal disease on dialysis   Plan: The patient had presented for dialysis. Patient's labs looked as expected.  Patient states he cannot wait to have dialysis this evening.  He will either come back tomorrow go to his scheduled dialysis on Wednesday.   Laurence Aly, MD    Note: This note was generated in part or whole with voice recognition software. Voice recognition is usually quite accurate but there are transcription errors that can  and very often do occur. I apologize for any typographical errors that were not detected and corrected.     Earleen Newport, MD 06/13/18 1359

## 2018-06-22 ENCOUNTER — Other Ambulatory Visit: Payer: Self-pay

## 2018-06-22 ENCOUNTER — Emergency Department
Admission: EM | Admit: 2018-06-22 | Discharge: 2018-06-22 | Disposition: A | Discharge status: Home / Self Care | Payer: Medicare Other | Attending: Emergency Medicine | Admitting: Emergency Medicine

## 2018-06-22 DIAGNOSIS — I120 Hypertensive chronic kidney disease with stage 5 chronic kidney disease or end stage renal disease: Secondary | ICD-10-CM | POA: Insufficient documentation

## 2018-06-22 DIAGNOSIS — Z5320 Procedure and treatment not carried out because of patient's decision for unspecified reasons: Secondary | ICD-10-CM | POA: Insufficient documentation

## 2018-06-22 DIAGNOSIS — I252 Old myocardial infarction: Secondary | ICD-10-CM | POA: Insufficient documentation

## 2018-06-22 DIAGNOSIS — N186 End stage renal disease: Secondary | ICD-10-CM | POA: Insufficient documentation

## 2018-06-22 DIAGNOSIS — F17210 Nicotine dependence, cigarettes, uncomplicated: Secondary | ICD-10-CM | POA: Diagnosis not present

## 2018-06-22 DIAGNOSIS — Z992 Dependence on renal dialysis: Secondary | ICD-10-CM | POA: Diagnosis not present

## 2018-06-22 DIAGNOSIS — I2510 Atherosclerotic heart disease of native coronary artery without angina pectoris: Secondary | ICD-10-CM | POA: Insufficient documentation

## 2018-06-22 MED ORDER — GENERIC EXTERNAL MEDICATION
1.90 | Status: DC
Start: ? — End: 2018-06-22

## 2018-06-22 MED ORDER — CINACALCET HCL 30 MG PO TABS
30.00 | ORAL_TABLET | ORAL | Status: DC
Start: 2018-06-21 — End: 2018-06-22

## 2018-06-22 MED ORDER — HEPARIN SODIUM (PORCINE) 1000 UNIT/ML IJ SOLN
4000.00 | INTRAMUSCULAR | Status: DC
Start: ? — End: 2018-06-22

## 2018-06-22 MED ORDER — GENERIC EXTERNAL MEDICATION
1.80 | Status: DC
Start: ? — End: 2018-06-22

## 2018-06-22 MED ORDER — GENERIC EXTERNAL MEDICATION
125.00 | Status: DC
Start: ? — End: 2018-06-22

## 2018-06-22 NOTE — ED Provider Notes (Signed)
[facility] Emergency Department Provider Note       Time seen: ----------------------------------------- 2:16 PM on 06/22/2018 -----------------------------------------   I have reviewed the triage vital signs and the nursing notes.  HISTORY   Chief Complaint Dialysis    HPI Fernando Salas is a 48 y.o. male with a history of coronary artery disease, COPD, end-stage renal disease, hyperlipidemia, hypertension, MI who presents to the ED for dialysis.  Patient states he normally gets dialysis at [facility] but cannot get there due to his finances.  Past Medical History:  Diagnosis Date  . CAD (coronary artery disease)    status post multiple myocardial infarctions  . COPD (chronic obstructive pulmonary disease) ([place])   . ESRD (end stage renal disease) ([place])   . Hyperlipidemia   . Hypertension   . Myocardial infarction ([place])    " I had 13 heart attacks, 8 massive"  . Proteinuria   . Renal insufficiency   . Secondary hyperparathyroidism of renal origin ([place])   . Shortness of breath dyspnea    " when I drink a cup of cold water too fast"  . Sleep apnea    pt stated " long story" does not wear CPAP  . Stroke [facility])     Patient Active Problem List   Diagnosis Date Noted  . Hypertensive urgency, malignant 04/08/2018  . Hypertensive urgency 03/25/2018  . Pulmonary edema 03/09/2017  . 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 Fluid overload 02/08/2017  . Accelerated hypertension 01/12/2017  . AKI (acute kidney injury) ([place])   . Unstable angina ([place]) 11/01/2016    Past Surgical History:  Procedure Laterality Date  . AV FISTULA PLACEMENT Left 01-24-14   By Dr. Hortencia Pilar in [place]  . AV FISTULA PLACEMENT Right 04/30/2014   Procedure: RIGHT BRACHIOCEPHALIC ARTERIOVENOUS (AV) FISTULA CREATION;  Surgeon: Elam Dutch, MD;  Location: [facility] OR;  Service: Vascular;  Laterality: Right;  . AV FISTULA PLACEMENT Left 05/31/2014   Procedure: Left INSERTION OF  ARTERIOVENOUS (AV) GORE-TEX GRAFT ARM;  Surgeon: Elam Dutch, MD;  Location: [place];  Service: Vascular;  Laterality: Left;  . CARDIAC CATHETERIZATION    . CORONARY STENT PLACEMENT     13 stents total  . DIALYSIS/PERMA CATHETER INSERTION N/A 01/15/2017   Procedure: DIALYSIS/PERMA CATHETER INSERTION;  Surgeon: Katha Cabal, MD;  Location: [place] CV LAB;  Service: Cardiovascular;  Laterality: N/A;    Allergies Ibuprofen; Viagra [sildenafil]; and Clonidine derivatives  Social History Social History   Tobacco Use  . Smoking status: Current Every Day Smoker    Packs/day: 0.25    Years: 25.00    Pack years: 6.25    Types: Cigarettes  . Smokeless tobacco: Never Used  Substance Use Topics  . Alcohol use: No    Alcohol/week: 0.0 standard drinks    Frequency: Never    Comment: occasional  . Drug use: Not Currently    Types: Marijuana    Comment: daily   Review of Systems Constitutional: Negative for fever. Cardiovascular: Negative for chest pain. Respiratory: Negative for shortness of breath. Gastrointestinal: Negative for abdominal pain, vomiting and diarrhea. Musculoskeletal: Negative for back pain. Skin: Negative for rash. Neurological: Negative for headaches, focal weakness or numbness.  All systems negative/normal/unremarkable except as stated in the HPI  ____________________________________________   PHYSICAL EXAM:  VITAL SIGNS: ED Triage Vitals [06/22/18 1309]  Enc Vitals Group     BP (!) 185/94     Pulse Rate 68  Resp 18     Temp      Temp src      SpO2 100 %     Weight 211 lb 10.3 oz (96 kg)     Height 6' (1.829 m)     Head Circumference      Peak Flow      Pain Score 0     Pain Loc      Pain Edu?      Excl. in [place]?     Constitutional: Alert and oriented. Well appearing and in no distress. Cardiovascular: Normal rate, regular rhythm. No murmurs, rubs, or gallops. Respiratory: Normal respiratory effort without tachypnea nor  retractions. Breath sounds are clear and equal bilaterally. No wheezes/rales/rhonchi. Gastrointestinal: Soft and nontender. Normal bowel sounds Musculoskeletal: Nontender with normal range of motion in extremities. No lower extremity tenderness nor edema. Neurologic:  Normal speech and language. No gross focal neurologic deficits are appreciated.  Skin:  Skin is warm, dry and intact. No rash noted. Psychiatric: Mood and affect are normal. Speech and behavior are normal.  ____________________________________________  ED COURSE:  As part of my medical decision making, I reviewed the following data within the [place] History obtained from family if available, nursing notes, old chart and ekg, as well as notes from prior ED visits. Patient presented for dialysis, I will discuss with nephrology.   Procedures  CHEVEZ SAMBRANO was evaluated in Emergency Department on 06/22/2018 for the symptoms described in the history of present illness. He was evaluated in the context of the global COVID-19 pandemic, which necessitated consideration that the patient might be at risk for infection with the SARS-CoV-2 virus that causes COVID-19. Institutional protocols and algorithms that pertain to the evaluation of patients at risk for COVID-19 are in a state of rapid change based on information released by regulatory bodies including the CDC and federal and state organizations. These policies and algorithms were followed during the patient's care in the ED.   ____________________________________________   DIFFERENTIAL DIAGNOSIS   End-stage renal disease on dialysis, noncompliance  FINAL ASSESSMENT AND PLAN  End-stage renal disease on dialysis   Plan: The patient had presented for dialysis.  I discussed with nephrology, he could not be dialyzed today but is advised to come back tomorrow.  Patient eloped prior to discharge paperwork.   Laurence Aly, MD    Note: This note was  generated in part or whole with voice recognition software. Voice recognition is usually quite accurate but there are transcription errors that can and very often do occur. I apologize for any typographical errors that were not detected and corrected.     Earleen Newport, MD 06/22/18 [phone]

## 2018-06-22 NOTE — ED Notes (Signed)
MD Jimmye Norman spoke with pt regarding dialysis. MD Jimmye Norman informed pt that dialysis would not be able to consult the pt today. Pt verbalized understanding and leaving at this time. Pt ambulatory to lobby in NAD. Pt refusing to sign discharge papers at this time.

## 2018-06-22 NOTE — ED Triage Notes (Signed)
Pt needs his scheduled dialysis. Gets dialysis at [facility] but unable to get there today due to finances. Pt scheduled MWF with last session Monday. Denies complaints.

## 2018-06-24 ENCOUNTER — Other Ambulatory Visit: Payer: Self-pay

## 2018-06-24 ENCOUNTER — Non-Acute Institutional Stay
Admission: EM | Admit: 2018-06-24 | Discharge: 2018-06-24 | Disposition: A | Discharge status: Home / Self Care | Payer: Medicare Other | Attending: Emergency Medicine | Admitting: Emergency Medicine

## 2018-06-24 DIAGNOSIS — Z7902 Long term (current) use of antithrombotics/antiplatelets: Secondary | ICD-10-CM | POA: Insufficient documentation

## 2018-06-24 DIAGNOSIS — I252 Old myocardial infarction: Secondary | ICD-10-CM | POA: Insufficient documentation

## 2018-06-24 DIAGNOSIS — Z72811 Adult antisocial behavior: Secondary | ICD-10-CM | POA: Diagnosis not present

## 2018-06-24 DIAGNOSIS — Z8673 Personal history of transient ischemic attack (TIA), and cerebral infarction without residual deficits: Secondary | ICD-10-CM | POA: Diagnosis not present

## 2018-06-24 DIAGNOSIS — D631 Anemia in chronic kidney disease: Secondary | ICD-10-CM | POA: Diagnosis not present

## 2018-06-24 DIAGNOSIS — F17210 Nicotine dependence, cigarettes, uncomplicated: Secondary | ICD-10-CM | POA: Insufficient documentation

## 2018-06-24 DIAGNOSIS — Z8249 Family history of ischemic heart disease and other diseases of the circulatory system: Secondary | ICD-10-CM | POA: Diagnosis not present

## 2018-06-24 DIAGNOSIS — Z886 Allergy status to analgesic agent status: Secondary | ICD-10-CM | POA: Diagnosis not present

## 2018-06-24 DIAGNOSIS — E785 Hyperlipidemia, unspecified: Secondary | ICD-10-CM | POA: Diagnosis not present

## 2018-06-24 DIAGNOSIS — N19 Unspecified kidney failure: Secondary | ICD-10-CM | POA: Diagnosis present

## 2018-06-24 DIAGNOSIS — Z888 Allergy status to other drugs, medicaments and biological substances status: Secondary | ICD-10-CM | POA: Diagnosis not present

## 2018-06-24 DIAGNOSIS — N186 End stage renal disease: Secondary | ICD-10-CM | POA: Insufficient documentation

## 2018-06-24 DIAGNOSIS — I2510 Atherosclerotic heart disease of native coronary artery without angina pectoris: Secondary | ICD-10-CM | POA: Diagnosis not present

## 2018-06-24 DIAGNOSIS — Z955 Presence of coronary angioplasty implant and graft: Secondary | ICD-10-CM | POA: Diagnosis not present

## 2018-06-24 DIAGNOSIS — G4730 Sleep apnea, unspecified: Secondary | ICD-10-CM | POA: Diagnosis not present

## 2018-06-24 DIAGNOSIS — Z992 Dependence on renal dialysis: Secondary | ICD-10-CM | POA: Diagnosis not present

## 2018-06-24 DIAGNOSIS — N2581 Secondary hyperparathyroidism of renal origin: Secondary | ICD-10-CM | POA: Diagnosis not present

## 2018-06-24 DIAGNOSIS — J449 Chronic obstructive pulmonary disease, unspecified: Secondary | ICD-10-CM | POA: Insufficient documentation

## 2018-06-24 DIAGNOSIS — Z79899 Other long term (current) drug therapy: Secondary | ICD-10-CM | POA: Diagnosis not present

## 2018-06-24 DIAGNOSIS — I120 Hypertensive chronic kidney disease with stage 5 chronic kidney disease or end stage renal disease: Secondary | ICD-10-CM | POA: Diagnosis not present

## 2018-06-24 MED ORDER — HEPARIN SODIUM (PORCINE) 1000 UNIT/ML DIALYSIS: 20.0000 [IU]/kg | INTRAMUSCULAR | Status: DC | PRN

## 2018-06-24 MED ORDER — CHLORHEXIDINE GLUCONATE CLOTH 2 % EX PADS: 6.0000 | MEDICATED_PAD | Freq: Every day | CUTANEOUS | Status: DC

## 2018-06-24 NOTE — Progress Notes (Signed)
Pre HD [facility]  06/24/18 1509  Hand-Off documentation  Report given to (Full Name) Beatris Ship, RN   Report received from (Full Name) [facility] ED RN, Fast Track  Vital Signs  Temp 98.1 F (36.7 C)  Temp Source Oral  Pulse Rate 68  Pulse Rate Source Monitor  Resp 16  BP (!) 184/97  BP Location Right Arm  BP Method Automatic  Patient Position (if appropriate) Sitting  Oxygen Therapy  SpO2 100 %  O2 Device Room Air  Pain Assessment  Pain Scale 0-10  Pain Score 0  Dialysis Weight  Weight 96 kg  Type of Weight Pre-Dialysis  Time-Out for Hemodialysis  What Procedure? HD  Pt Identifiers(min of two) First/Last Name;MRN/Account#  Correct Site? Yes  Correct Side? Yes  Correct Procedure? Yes  Consents Verified? Yes  Rad Studies Available? N/A  Safety Precautions Reviewed? Yes  Engineer, civil (consulting) Number 1  Station Number 3  UF/Alarm Test Passed  Conductivity: Meter 14  Conductivity: Machine  13.9  pH 7.4  Reverse Osmosis Main  Normal Saline Lot Number [id number]  Dialyzer Lot Number [id number]  Disposable Set Lot Number [id number]  Machine Temperature 98.6 F (37 C)  Musician and Audible Yes  Blood Lines Intact and Secured Yes  Pre Treatment Patient Checks  Vascular access used during treatment Catheter  Patient is receiving dialysis in a chair Yes  Hepatitis B Surface Antigen Results Negative  Date Hepatitis B Surface Antigen Drawn 05/25/18  Hepatitis B Surface Antibody  (<10)  Date Hepatitis B Surface Antibody Drawn 05/25/18  Hemodialysis Consent Verified Yes  Hemodialysis Standing Orders Initiated Yes  ECG (Telemetry) Monitor On Yes  Prime Ordered Normal Saline  Length of  DialysisTreatment -hour(s) 3.5 Hour(s)  Dialysis Treatment Comments Na 140  Dialyzer Elisio 17H NR  Dialysate 2K, 2.5 Ca  Dialysis Anticoagulant Heparin  Dialysate Flow Ordered 800  Blood Flow Rate Ordered 400 mL/min  Ultrafiltration Goal 1.5 Liters  Dialysis Blood Pressure Support Ordered  Normal Saline  Education / Care Plan  Dialysis Education Provided Yes  Documented Education in Care Plan Yes  Hemodialysis Catheter Right Subclavian Double-lumen  Placement Date/Time: (c) 02/10/17 1343   Placed prior to admission: Yes  Orientation: Right  Access Location: Subclavian  Hemodialysis Catheter Type: Double-lumen  Site Condition No complications  Blue Lumen Status Blood return noted  Red Lumen Status Blood return noted  Purple Lumen Status N/A  Dressing Type Occlusive;Biopatch  Dressing Status Clean;Dry;Intact

## 2018-06-24 NOTE — ED Notes (Signed)
See triage note  Here for dialysis   States last time for treatment was Monday  Was not able to return yesterday  Denies any other sx's

## 2018-06-24 NOTE — Progress Notes (Signed)
Post HD Assessment    06/24/18 1832  Neurological  Level of Consciousness Alert  Orientation Level Oriented X4  Respiratory  Respiratory Pattern Regular  Chest Assessment Chest expansion symmetrical  Bilateral Breath Sounds Clear;Diminished  Cough None  Cardiac  Pulse Regular  Heart Sounds S1, S2  ECG Monitor Yes  Cardiac Rhythm ST  Vascular  R Radial Pulse +2  L Radial Pulse +2  Edema Generalized  Generalized Edema +1  Psychosocial  Psychosocial (WDL) WDL

## 2018-06-24 NOTE — ED Notes (Signed)
Received pt back from dialysis   Tolerated treatment well

## 2018-06-24 NOTE — Progress Notes (Signed)
HD Tx completed, tolerated well   06/24/18 1830  Vital Signs  Pulse Rate 60  Pulse Rate Source Monitor  Resp 16  BP (!) 180/107  BP Location Right Arm  BP Method Automatic  Patient Position (if appropriate) Sitting  Oxygen Therapy  SpO2 100 %  O2 Device Room Air  During Hemodialysis Assessment  Blood Flow Rate (mL/min) 400 mL/min  Arterial Pressure (mmHg) -170 mmHg  Venous Pressure (mmHg) 150 mmHg  Transmembrane Pressure (mmHg) 60 mmHg  Ultrafiltration Rate (mL/min) 840 mL/min  Dialysate Flow Rate (mL/min) 800 ml/min  Conductivity: Machine  13.9  HD Safety Checks Performed Yes  KECN 70.6 KECN  Dialysis Fluid Bolus Normal Saline  Bolus Amount (mL) 250 mL  Intra-Hemodialysis Comments Tx completed;Tolerated well

## 2018-06-24 NOTE — ED Provider Notes (Signed)
[facility] Emergency Department Provider Note  ____________________________________________  Time seen: Approximately 12:58 PM  I have reviewed the triage vital signs and the nursing notes.   HISTORY  Chief Complaint Dialysis   HPI Fernando Salas is a 48 y.o. male who presents to the emergency department for dialysis.  His last dialysis was Monday.  He denies symptoms of concern.  He states that he is a little short of breath which is normal if he misses a day of his dialysis.  He states he did come for dialysis yesterday but was unable to be dialyzed due to scheduling.  Past Medical History:  Diagnosis Date  . CAD (coronary artery disease)    status post multiple myocardial infarctions  . COPD (chronic obstructive pulmonary disease) ([place])   . ESRD (end stage renal disease) ([place])   . Hyperlipidemia   . Hypertension   . Myocardial infarction ([place])    " I had 13 heart attacks, 8 massive"  . Proteinuria   . Renal insufficiency   . Secondary hyperparathyroidism of renal origin ([place])   . Shortness of breath dyspnea    " when I drink a cup of cold water too fast"  . Sleep apnea    pt stated " long story" does not wear CPAP  . Stroke [facility])     Patient Active Problem List   Diagnosis Date Noted  . Hypertensive urgency, malignant 04/08/2018  . Hypertensive urgency 03/25/2018  . Pulmonary edema 03/09/2017  . 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 Fluid overload 02/08/2017  . Accelerated hypertension 01/12/2017  . AKI (acute kidney injury) ([place])   . Unstable angina ([place]) 11/01/2016    Past Surgical History:  Procedure Laterality Date  . AV FISTULA PLACEMENT Left 01-24-14   By Dr. Hortencia Pilar in [place]  . AV FISTULA PLACEMENT Right 04/30/2014   Procedure: RIGHT BRACHIOCEPHALIC ARTERIOVENOUS (AV) FISTULA CREATION;  Surgeon: Elam Dutch, MD;  Location: [facility] OR;  Service: Vascular;  Laterality: Right;  . AV FISTULA PLACEMENT Left 05/31/2014   Procedure: Left INSERTION OF ARTERIOVENOUS (AV) GORE-TEX GRAFT ARM;  Surgeon: Elam Dutch, MD;  Location: [place];  Service: Vascular;  Laterality: Left;  . CARDIAC CATHETERIZATION    . CORONARY STENT PLACEMENT     13 stents total  . DIALYSIS/PERMA CATHETER INSERTION N/A 01/15/2017   Procedure: DIALYSIS/PERMA CATHETER INSERTION;  Surgeon: Katha Cabal, MD;  Location: [place] [facility];  Service: Cardiovascular;  Laterality: N/A;    Prior to Admission medications   Medication Sig Start Date End Date Taking? Authorizing Provider  amLODipine (NORVASC) 10 MG tablet Take 1 tablet (10 mg total) by mouth daily. 05/01/17 06/10/18  Lisa Roca, MD  atorvastatin (LIPITOR) 40 MG tablet Take 1 tablet (40 mg total) by mouth daily. 11/03/16   Demetrios Loll, MD  calcium acetate (PHOSLO) 667 MG capsule Take 1,334 mg by mouth 3 (three) times daily with meals. 12/03/17 12/03/18  [provider]  carvedilol (COREG) 25 MG tablet Take 1 tablet (25 mg total) by mouth 2 (two) times daily. 05/01/17 06/10/18  Lisa Roca, MD  clopidogrel (PLAVIX) 75 MG tablet Take 1 tablet (75 mg total) by mouth daily. 11/03/16   Demetrios Loll, MD  furosemide (LASIX) 80 MG tablet Take 80 mg by mouth See admin instructions. Take 1 tablet (80mg ) every morning on non-dialysis days 05/16/18   [provider]  hydrALAZINE (APRESOLINE) 100 MG tablet Take 1 tablet (100 mg total) by mouth 4 (four)  times daily. Patient taking differently: Take 100 mg by mouth every 8 (eight) hours.  05/01/17 06/10/18  Lisa Roca, MD  isosorbide dinitrate (ISORDIL) 30 MG tablet Take 30 mg by mouth 3 (three) times daily.     [provider]  Multiple Vitamin (MULTIVITAMIN) tablet Take 1 tablet by mouth daily. 04/10/18   Henreitta Leber, MD    Allergies Ibuprofen; Viagra [sildenafil]; and Clonidine derivatives  Family History  Problem Relation Age of Onset  . Heart disease Mother   . Hyperlipidemia Mother   . Hypertension Mother   .  Kidney disease Father   . Kidney disease Brother   . Hyperlipidemia Sister   . Hyperlipidemia Daughter   . Hypertension Sister   . Hypertension Daughter     Social History Social History   Tobacco Use  . Smoking status: Current Every Day Smoker    Packs/day: 0.25    Years: 25.00    Pack years: 6.25    Types: Cigarettes  . Smokeless tobacco: Never Used  Substance Use Topics  . Alcohol use: No    Alcohol/week: 0.0 standard drinks    Frequency: Never    Comment: occasional  . Drug use: Not Currently    Types: Marijuana    Comment: daily    Review of Systems Constitutional: Negative for fever. ENT: Negative for sore throat. Respiratory: Negative for shortness of breath. Gastrointestinal: No abdominal pain.  No nausea, no vomiting.  No diarrhea.  Musculoskeletal: Negative for generalized body aches. Skin: Negative for rash/lesion/wound. Neurological: Negative for headaches, focal weakness or numbness.  ____________________________________________   PHYSICAL EXAM:  VITAL SIGNS: ED Triage Vitals  Enc Vitals Group     BP 06/24/18 1220 (!) 169/102     Pulse Rate 06/24/18 1220 66     Resp 06/24/18 1220 18     Temp 06/24/18 1220 98.1 F (36.7 C)     Temp Source 06/24/18 1220 Oral     SpO2 06/24/18 1220 94 %     Weight 06/24/18 1221 211 lb 10.3 oz (96 kg)     Height 06/24/18 1221 6' (1.829 m)     Head Circumference --      Peak Flow --      Pain Score 06/24/18 1221 0     Pain Loc --      Pain Edu? --      Excl. in [place]? --     Constitutional: Alert and oriented. Well appearing and in no acute distress. Eyes: Conjunctivae are normal. PERRL. EOMI. Head: Atraumatic. Nose: No congestion/rhinnorhea. Mouth/Throat: Mucous membranes are moist. Neck: No stridor.  Cardiovascular: Normal rate, regular rhythm. Good peripheral circulation. Respiratory: Normal respiratory effort. Musculoskeletal: Full ROM throughout.  Neurologic:  Normal speech and language. No gross focal  neurologic deficits are appreciated. Speech is normal. No gait instability. Skin:  Skin is warm, dry and intact. No rash noted. Psychiatric: Mood and affect are normal. Speech and behavior are normal.  ____________________________________________   LABS (all labs ordered are listed, but only abnormal results are displayed)  Labs Reviewed - No data to display ____________________________________________  EKG  Not indicated. ____________________________________________  RADIOLOGY  Not indicated. ____________________________________________   PROCEDURES  None ____________________________________________   INITIAL IMPRESSION / ASSESSMENT AND PLAN / ED COURSE   48 year old male presents for dialysis. Dr. Candiss Norse contacted and will enter orders. Patient denies any concerns and is eating [employer] in the room.   Pertinent labs & imaging results that were available during my  care of the patient were reviewed by me and considered in my medical decision making (see chart for details).  . ____________________________________________   FINAL CLINICAL IMPRESSION(S) / ED DIAGNOSES  Final diagnoses:  ESRD on dialysis [facility])       Victorino Dike, FNP 06/25/18 [place], [place], MD 06/26/18 [phone]

## 2018-06-24 NOTE — Progress Notes (Signed)
Pre HD Assessment    06/24/18 1500  Neurological  Level of Consciousness Alert  Orientation Level Oriented X4  Respiratory  Respiratory Pattern Regular  Chest Assessment Chest expansion symmetrical  Bilateral Breath Sounds Clear;Diminished  Cough None  Cardiac  Pulse Regular  Heart Sounds S1, S2  ECG Monitor Yes  Cardiac Rhythm ST  Vascular  R Radial Pulse +2  L Radial Pulse +2  Edema Generalized  Generalized Edema +1  Psychosocial  Psychosocial (WDL) WDL

## 2018-06-24 NOTE — Progress Notes (Signed)
HD Tx Started w/o complication   00/34/96 1164  Vital Signs  Pulse Rate 67  Pulse Rate Source Monitor  Resp 16  BP (!) 165/97  BP Location Right Arm  BP Method Automatic  Patient Position (if appropriate) Sitting  Oxygen Therapy  SpO2 100 %  O2 Device Room Air  During Hemodialysis Assessment  Blood Flow Rate (mL/min) 400 mL/min  Arterial Pressure (mmHg) -160 mmHg  Venous Pressure (mmHg) 130 mmHg  Transmembrane Pressure (mmHg) 60 mmHg  Ultrafiltration Rate (mL/min) 840 mL/min  Dialysate Flow Rate (mL/min) 800 ml/min  Conductivity: Machine  13.9  HD Safety Checks Performed Yes  Dialysis Fluid Bolus Normal Saline  Bolus Amount (mL) 250 mL  Intra-Hemodialysis Comments Tx initiated

## 2018-06-24 NOTE — ED Triage Notes (Signed)
Pt is here needing dialysis, states last treatment was on Monday, came here on Wednesday and was told to come back yesterday to have it done but states he was not able to

## 2018-06-24 NOTE — Progress Notes (Signed)
[facility] [place] [facility]  ROUNDING NOTE   Subjective:   Presents for uremia. Does not have a regular HD unit Denies acute c/o   HEMODIALYSIS FLOWSHEET:  Blood Flow Rate (mL/min): 400 mL/min Arterial Pressure (mmHg): -170 mmHg Venous Pressure (mmHg): 150 mmHg Transmembrane Pressure (mmHg): 60 mmHg Ultrafiltration Rate (mL/min): 840 mL/min Dialysate Flow Rate (mL/min): 800 ml/min Conductivity: Machine : 13.9 Conductivity: Machine : 13.9 Dialysis Fluid Bolus: Normal Saline Bolus Amount (mL): 250 mL    Objective:  Vital signs in last 24 hours:  Temp:  [98.1 F (36.7 C)-98.3 F (36.8 C)] 98.1 F (36.7 C) (04/24 1509) Pulse Rate:  [59-70] 65 (04/24 1645) Resp:  [16-18] 16 (04/24 1514) BP: (158-187)/(95-109) 187/104 (04/24 1645) SpO2:  [94 %-100 %] 100 % (04/24 1514) Weight:  [96 kg] 96 kg (04/24 1509)  Weight change:  Filed Weights   06/24/18 1221 06/24/18 1509  Weight: 96 kg 96 kg    Intake/Output: No intake/output data recorded.   Intake/Output this shift:  No intake/output data recorded.  Physical Exam: General: NAD,   Head: Normocephalic, atraumatic. Moist oral mucosal membranes  Eyes: Anicteric,   Neck: Supple, trachea midline  Lungs:  Clear to auscultation  Heart: Regular rate and rhythm  Abdomen:  Soft, nontender,   Extremities:  no peripheral edema.  Neurologic: Nonfocal, moving all four extremities  Skin: No lesions  Access: RIJ permcath    Basic Metabolic Panel: No results for input(s): NA, K, CL, CO2, GLUCOSE, BUN, CREATININE, CALCIUM, MG, PHOS in the last 168 hours.  Liver Function Tests: No results for input(s): AST, ALT, ALKPHOS, BILITOT, PROT, ALBUMIN in the last 168 hours. No results for input(s): LIPASE, AMYLASE in the last 168 hours. No results for input(s): AMMONIA in the last 168 hours.  CBC: No results for input(s): WBC, NEUTROABS, HGB, HCT, MCV, PLT in the last 168 hours.  Cardiac Enzymes: No results for input(s): CKTOTAL,  CKMB, CKMBINDEX, TROPONINI in the last 168 hours.  BNP: Invalid input(s): POCBNP  CBG: No results for input(s): GLUCAP in the last 168 hours.  Microbiology: Results for orders placed or performed during the hospital encounter of 04/08/18  MRSA PCR Screening     Status: None   Collection Time: 04/09/18  4:03 PM  Result Value Ref Range Status   MRSA by PCR NEGATIVE NEGATIVE Final    Comment:        The GeneXpert MRSA Assay (FDA approved for NASAL specimens only), is one component of a comprehensive MRSA colonization surveillance program. It is not intended to diagnose MRSA infection nor to guide or monitor treatment for MRSA infections. Performed at [facility], [address] [place], [place] [address]     Coagulation Studies: No results for input(s): LABPROT, INR in the last 72 hours.  Urinalysis: No results for input(s): COLORURINE, LABSPEC, PHURINE, GLUCOSEU, HGBUR, BILIRUBINUR, KETONESUR, PROTEINUR, UROBILINOGEN, NITRITE, LEUKOCYTESUR in the last 72 hours.  Invalid input(s): APPERANCEUR    Imaging: No results found.   Medications:    . [START ON 06/25/2018] Chlorhexidine Gluconate Cloth  6 each Topical Q0600     Assessment/ Plan:  [name] is a 48 y.o. black male with end stage renal disease on hemodialysis, hypertension, CVA, sleep apnea, coronary artery disease, COPD, anti-social personality disorder.   1. Severe Hypertension: - restart home meds - not sure about compliance at home. Patient reports not taking the meds today  2. End Stage Renal Disease, uremia : currently without an outpatient unit.  Usually gets treatment at [facility].  - Hemodialysis for today. UF goal 0.5-1 kg as he is below dry weight  3. Anemia of chronic kidney disease: hemoglobin 9.1 - Hold EPO due to uncontrolled HTN   4. Secondary Hyperparathyroidism:   - calcium acetate with meals.    LOS: 0 Fernando Salas 4/24/20205:23 PM

## 2018-06-24 NOTE — Progress Notes (Signed)
Post HD [facility]    06/24/18 1833  Hand-Off documentation  Report given to (Full Name) Vaughan Basta ED RN,   Report received from (Full Name) Beatris Ship, RN   Vital Signs  Temp 98.1 F (36.7 C)  Temp Source Oral  Pulse Rate 65  Pulse Rate Source Monitor  Resp 16  BP (!) 178/109  BP Location Right Arm  BP Method Automatic  Patient Position (if appropriate) Sitting  Oxygen Therapy  SpO2 100 %  O2 Device Room Air  Pain Assessment  Pain Scale 0-10  Pain Score 0  Dialysis Weight  Weight 94 kg  Type of Weight Post-Dialysis  Post-Hemodialysis Assessment  Rinseback Volume (mL) 250 mL  KECN 70.6 V  Dialyzer Clearance Lightly streaked  Duration of HD Treatment -hour(s) 3.5 hour(s)  Hemodialysis Intake (mL) 500 mL  UF Total -Machine (mL) 2504 mL  Net UF (mL) 2004 mL  Tolerated HD Treatment Yes  Hemodialysis Catheter Right Subclavian Double-lumen  Placement Date/Time: (c) 02/10/17 1343   Placed prior to admission: Yes  Orientation: Right  Access Location: Subclavian  Hemodialysis Catheter Type: Double-lumen  Site Condition No complications  Post treatment catheter status Capped and Clamped

## 2018-06-27 ENCOUNTER — Other Ambulatory Visit: Payer: Self-pay

## 2018-06-27 ENCOUNTER — Non-Acute Institutional Stay
Admission: EM | Admit: 2018-06-27 | Discharge: 2018-06-27 | Disposition: A | Discharge status: Home / Self Care | Payer: Medicare Other | Attending: Student in an Organized Health Care Education/Training Program | Admitting: Student in an Organized Health Care Education/Training Program

## 2018-06-27 DIAGNOSIS — N186 End stage renal disease: Secondary | ICD-10-CM | POA: Diagnosis not present

## 2018-06-27 DIAGNOSIS — Z8249 Family history of ischemic heart disease and other diseases of the circulatory system: Secondary | ICD-10-CM | POA: Diagnosis not present

## 2018-06-27 DIAGNOSIS — Z7902 Long term (current) use of antithrombotics/antiplatelets: Secondary | ICD-10-CM | POA: Diagnosis not present

## 2018-06-27 DIAGNOSIS — J449 Chronic obstructive pulmonary disease, unspecified: Secondary | ICD-10-CM | POA: Diagnosis not present

## 2018-06-27 DIAGNOSIS — Z955 Presence of coronary angioplasty implant and graft: Secondary | ICD-10-CM | POA: Diagnosis not present

## 2018-06-27 DIAGNOSIS — I252 Old myocardial infarction: Secondary | ICD-10-CM | POA: Diagnosis not present

## 2018-06-27 DIAGNOSIS — E785 Hyperlipidemia, unspecified: Secondary | ICD-10-CM | POA: Diagnosis not present

## 2018-06-27 DIAGNOSIS — N2581 Secondary hyperparathyroidism of renal origin: Secondary | ICD-10-CM | POA: Insufficient documentation

## 2018-06-27 DIAGNOSIS — Z8673 Personal history of transient ischemic attack (TIA), and cerebral infarction without residual deficits: Secondary | ICD-10-CM | POA: Diagnosis not present

## 2018-06-27 DIAGNOSIS — I2510 Atherosclerotic heart disease of native coronary artery without angina pectoris: Secondary | ICD-10-CM | POA: Insufficient documentation

## 2018-06-27 DIAGNOSIS — Z79899 Other long term (current) drug therapy: Secondary | ICD-10-CM | POA: Insufficient documentation

## 2018-06-27 DIAGNOSIS — G4730 Sleep apnea, unspecified: Secondary | ICD-10-CM | POA: Insufficient documentation

## 2018-06-27 DIAGNOSIS — Z992 Dependence on renal dialysis: Secondary | ICD-10-CM | POA: Insufficient documentation

## 2018-06-27 DIAGNOSIS — F17210 Nicotine dependence, cigarettes, uncomplicated: Secondary | ICD-10-CM | POA: Diagnosis not present

## 2018-06-27 DIAGNOSIS — I120 Hypertensive chronic kidney disease with stage 5 chronic kidney disease or end stage renal disease: Secondary | ICD-10-CM | POA: Diagnosis not present

## 2018-06-27 LAB — CBC
HCT: 29.3 % — ABNORMAL LOW (ref 39.0–52.0)
Hemoglobin: 9.7 g/dL — ABNORMAL LOW (ref 13.0–17.0)
MCH: 31.3 pg (ref 26.0–34.0)
MCHC: 33.1 g/dL (ref 30.0–36.0)
MCV: 94.5 fL (ref 80.0–100.0)
Platelets: 182 10*3/uL (ref 150–400)
RBC: 3.1 MIL/uL — ABNORMAL LOW (ref 4.22–5.81)
RDW: 15.5 % (ref 11.5–15.5)
WBC: 5.5 10*3/uL (ref 4.0–10.5)
nRBC: 0 % (ref 0.0–0.2)

## 2018-06-27 MED ORDER — SODIUM CHLORIDE 0.9 % IV SOLN: 100.0000 mL | INTRAVENOUS | Status: DC | PRN

## 2018-06-27 MED ORDER — HEPARIN SODIUM (PORCINE) 1000 UNIT/ML DIALYSIS
1000.0000 [IU] | INTRAMUSCULAR | Status: DC | PRN
  Filled 2018-06-27: qty 1

## 2018-06-27 MED ORDER — CHLORHEXIDINE GLUCONATE CLOTH 2 % EX PADS
6.0000 | MEDICATED_PAD | Freq: Every day | CUTANEOUS | Status: DC
  Filled 2018-06-27: qty 6

## 2018-06-27 MED ORDER — HEPARIN SODIUM (PORCINE) 1000 UNIT/ML DIALYSIS
100.0000 [IU]/kg | INTRAMUSCULAR | Status: DC | PRN
  Filled 2018-06-27: qty 10

## 2018-06-27 NOTE — Progress Notes (Signed)
HD Tx Start   06/27/18 1550  Vital Signs  Pulse Rate 72  Resp 18  BP (!) 154/94  Oxygen Therapy  SpO2 97 %  During Hemodialysis Assessment  Blood Flow Rate (mL/min) 400 mL/min  Arterial Pressure (mmHg) -180 mmHg  Venous Pressure (mmHg) 170 mmHg  Transmembrane Pressure (mmHg) 50 mmHg  Ultrafiltration Rate (mL/min) 170 mL/min (170 mL/HOUR removal. Pt refused fluid pull(see assessment))  Dialysate Flow Rate (mL/min) 800 ml/min  Conductivity: Machine  14  HD Safety Checks Performed Yes  Dialysis Fluid Bolus Normal Saline  Bolus Amount (mL) 250 mL  Intra-Hemodialysis Comments Tx initiated  Hemodialysis Catheter Right Subclavian Double-lumen  Placement Date/Time: (c) 02/10/17 1343   Placed prior to admission: Yes  Orientation: Right  Access Location: Subclavian  Hemodialysis Catheter Type: Double-lumen  Site Condition No complications  Blue Lumen Status Infusing  Red Lumen Status Infusing  Purple Lumen Status N/A

## 2018-06-27 NOTE — Progress Notes (Signed)
Post HD Assessment  No UF removal, MD aware. Tolerated tx well. Arrived and left under target weight d/t diarrhea (reported by pt).    06/27/18 1930  Neurological  Level of Consciousness Alert  Orientation Level Oriented X4  Respiratory  Respiratory Pattern Regular  Chest Assessment Chest expansion symmetrical  Bilateral Breath Sounds Clear  Cardiac  Pulse Regular  Heart Sounds S1, S2  Jugular Venous Distention (JVD) No  ECG Monitor No (pt refused monitor)  Vascular  R Radial Pulse +2  L Radial Pulse +2  Edema Generalized  Integumentary  Integumentary (WDL) X  Skin Color Appropriate for ethnicity  Skin Condition Dry  Musculoskeletal  Musculoskeletal (WDL) WDL  Gastrointestinal  Bowel Sounds Assessment Hyperactive  GU Assessment  Genitourinary (WDL) X  Genitourinary Symptoms Other (Comment) (Hd pt)  Psychosocial  Psychosocial (WDL) WDL

## 2018-06-27 NOTE — Progress Notes (Signed)
Pre HD Tx   06/27/18 1545  Hand-Off documentation  Report given to (Full Name) Trellis Paganini RN  Report received from (Full Name) Vaughan Basta RN ED  Vital Signs  Temp 98 F (36.7 C)  Temp Source Oral  Pulse Rate 72  Pulse Rate Source Monitor  Resp 18  BP (!) 158/97  BP Location Right Arm  BP Method Automatic  Patient Position (if appropriate) Sitting  Oxygen Therapy  SpO2 98 %  O2 Device Room Air  Pain Assessment  Pain Scale 0-10  Pain Score 0  Dialysis Weight  Weight 94.7 kg  Type of Weight Pre-Dialysis  Time-Out for Hemodialysis  What Procedure? Hemodialysis  Pt Identifiers(min of two) First/Last Name;MRN/Account#  Correct Site? Yes  Correct Side? Yes  Correct Procedure? Yes  Consents Verified? Yes  Rad Studies Available? N/A  Safety Precautions Reviewed? Yes  Engineer, civil (consulting) Number 4  Station Number 3  UF/Alarm Test Passed  Conductivity: Meter 14  Conductivity: Machine  14  pH 7.2  Reverse Osmosis Main   Normal Saline Lot Number [id number]  Dialyzer Lot Number [id number]  Disposable Set Lot Number [id number]  Machine Temperature 98.6 F (37 C)  Musician and Audible Yes  Blood Lines Intact and Secured Yes  Pre Treatment Patient Checks  Vascular access used during treatment Catheter  Patient is receiving dialysis in a chair Yes  Hepatitis B Surface Antigen Results Negative (drawn today - expired - 4/25 last draw)  Date Hepatitis B Surface Antigen Drawn 06/25/18  Hepatitis B Surface Antibody  (<10)  Date Hepatitis B Surface Antibody Drawn 06/25/18  Hemodialysis Consent Verified Yes  Hemodialysis Standing Orders Initiated Yes  ECG (Telemetry) Monitor On No (pt refused monitor)  Prime Ordered Normal Saline  Length of  DialysisTreatment -hour(s) 3.5 Hour(s)  Dialysis Treatment Comments Na 140   Dialyzer Elisio 17H NR  Dialysate 2K, 2.5 Ca  Dialysate Flow Ordered 800  Blood Flow Rate Ordered 400 mL/min  Ultrafiltration Goal 3 Liters  Pre Treatment  Labs Renal panel;CBC;Hepatitis B Surface Antigen  Dialysis Blood Pressure Support Ordered Normal Saline  Education / Care Plan  Dialysis Education Provided Yes  Documented Education in Care Plan Yes  Hemodialysis Catheter Right Subclavian Double-lumen  Placement Date/Time: (c) 02/10/17 1343   Placed prior to admission: Yes  Orientation: Right  Access Location: Subclavian  Hemodialysis Catheter Type: Double-lumen  Site Condition No complications  Blue Lumen Status Capped (Central line)  Red Lumen Status Capped (Central line)  Purple Lumen Status N/A  Catheter fill volume (Arterial) 2.4 cc  Catheter fill volume (Venous) 2.4  Dressing Type Occlusive (pt refused dressing change)  Dressing Status Clean;Dry;Intact  Interventions Other (Comment) (refused dressing change)  Drainage Description None  Dressing Change Due  (refused dressing change)

## 2018-06-27 NOTE — Progress Notes (Signed)
Pre HD Assessment  Pt arrives from ED. Calm, cooperative -- c/o diarrhea and GI upset this morning. Reports that this is the first time he's had this issue since being on dialysis. Weighed in 94.7. reports normal target weight at 97kg.  UF goal set to zero for this reason, MD aware.    06/27/18 1545  Neurological  Level of Consciousness Alert  Orientation Level Oriented X4  Respiratory  Respiratory Pattern Regular  Chest Assessment Chest expansion symmetrical  Bilateral Breath Sounds Clear;Diminished  Cough None  Cardiac  Pulse Regular  Heart Sounds S1, S2  Jugular Venous Distention (JVD) No  ECG Monitor No (pt refused monitor)  Vascular  R Radial Pulse +2  L Radial Pulse +2  Edema Generalized  Integumentary  Integumentary (WDL) X  Skin Color Appropriate for ethnicity  Skin Condition Dry  Additional Integumentary Comments  (HD pt/cath exit site)  Musculoskeletal  Musculoskeletal (WDL) WDL  Gastrointestinal  Bowel Sounds Assessment Hyperactive  Last BM Date 06/27/18  Additional Gastrointestinal Comments  (pt c/o diarrhea; started this morning.)  GU Assessment  Genitourinary (WDL) X  Genitourinary Symptoms Other (Comment) (Hd pt)  Psychosocial  Psychosocial (WDL) WDL

## 2018-06-27 NOTE — ED Notes (Signed)
Taken to dialysis vis w/c

## 2018-06-27 NOTE — ED Notes (Signed)
During VS patient states he usually gets diarrhea prior to dialysis being due.

## 2018-06-27 NOTE — ED Notes (Signed)
Informed pt that it will be approx 2 hr wait for dialysis

## 2018-06-27 NOTE — Progress Notes (Signed)
Post HD Tx   06/27/18 1939  Pain Assessment  Pain Scale 0-10  Pain Score 0  Dialysis Weight  Weight 94.2 kg  Type of Weight Post-Dialysis  Post-Hemodialysis Assessment  Rinseback Volume (mL) 250 mL  Dialyzer Clearance Lightly streaked  Duration of HD Treatment -hour(s) 3.5 hour(s)  Hemodialysis Intake (mL) 500 mL  UF Total -Machine (mL) 500 mL  Net UF (mL) 0 mL  Tolerated HD Treatment Yes  Hemodialysis Catheter Right Subclavian Double-lumen  Placement Date/Time: (c) 02/10/17 1343   Placed prior to admission: Yes  Orientation: Right  Access Location: Subclavian  Hemodialysis Catheter Type: Double-lumen  Site Condition No complications  Blue Lumen Status Flushed;Saline locked;Capped (Central line);Heparin locked  Red Lumen Status Flushed;Saline locked;Capped (Central line);Heparin locked  Purple Lumen Status N/A  Catheter fill solution Heparin 1000 units/ml  Catheter fill volume (Arterial) 2.4 cc  Catheter fill volume (Venous) 2.4  Dressing Type Occlusive  Dressing Status Clean;Dry;Intact  Drainage Description None  Post treatment catheter status Capped and Clamped

## 2018-06-27 NOTE — ED Provider Notes (Signed)
[facility] Emergency Department Provider Note  ____________________________________________  Time seen: Approximately 4:17 PM  I have reviewed the triage vital signs and the nursing notes.   HISTORY  Chief Complaint Dialysis Treatment and Diarrhea    HPI Fernando Salas is a 48 y.o. male that presents emergency department for dialysis.  Patient states that he has several episodes of loose stools since last night and did not want to drive to [facility].  No blood or mucus in stool.  Patient is hungry.  Patient states that he tried to come to the emergency department in time for lunch here and to take medications.  He is requesting a sandwich.  He does not want any blood work done in the emergency department because last time he received blood work here, his hand started to swell.  No fever, nausea, vomiting, abdominal pain, back pain.   Past Medical History:  Diagnosis Date  . CAD (coronary artery disease)    status post multiple myocardial infarctions  . COPD (chronic obstructive pulmonary disease) ([place])   . ESRD (end stage renal disease) ([place])   . Hyperlipidemia   . Hypertension   . Myocardial infarction ([place])    " I had 13 heart attacks, 8 massive"  . Proteinuria   . Renal insufficiency   . Secondary hyperparathyroidism of renal origin ([place])   . Shortness of breath dyspnea    " when I drink a cup of cold water too fast"  . Sleep apnea    pt stated " long story" does not wear CPAP  . Stroke [facility])     Patient Active Problem List   Diagnosis Date Noted  . Hypertensive urgency, malignant 04/08/2018  . Hypertensive urgency 03/25/2018  . Pulmonary edema 03/09/2017  . 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 Fluid overload 02/08/2017  . Accelerated hypertension 01/12/2017  . AKI (acute kidney injury) ([place])   . Unstable angina ([place]) 11/01/2016    Past Surgical History:  Procedure Laterality Date  . AV FISTULA PLACEMENT Left 01-24-14   By Dr.  Hortencia Pilar in [place]  . AV FISTULA PLACEMENT Right 04/30/2014   Procedure: RIGHT BRACHIOCEPHALIC ARTERIOVENOUS (AV) FISTULA CREATION;  Surgeon: Elam Dutch, MD;  Location[facility] OR;  Service: Vascular;  Laterality: Right;  . AV FISTULA PLACEMENT Left 05/31/2014   Procedure: Left INSERTION OF ARTERIOVENOUS (AV) GORE-TEX GRAFT ARM;  Surgeon: Elam Dutch, MD;  Location: [place];  Service: Vascular;  Laterality: Left;  . CARDIAC CATHETERIZATION    . CORONARY STENT PLACEMENT     13 stents total  . DIALYSIS/PERMA CATHETER INSERTION N/A 01/15/2017   Procedure: DIALYSIS/PERMA CATHETER INSERTION;  Surgeon: Katha Cabal, MD;  Location: [place] CV LAB;  Service: Cardiovascular;  Laterality: N/A;    Prior to Admission medications   Medication Sig Start Date End Date Taking? Authorizing Provider  amLODipine (NORVASC) 10 MG tablet Take 1 tablet (10 mg total) by mouth daily. 05/01/17 06/10/18  Lisa Roca, MD  atorvastatin (LIPITOR) 40 MG tablet Take 1 tablet (40 mg total) by mouth daily. 11/03/16   Demetrios Loll, MD  calcium acetate (PHOSLO) 667 MG capsule Take 1,334 mg by mouth 3 (three) times daily with meals. 12/03/17 12/03/18  [provider]  carvedilol (COREG) 25 MG tablet Take 1 tablet (25 mg total) by mouth 2 (two) times daily. 05/01/17 06/10/18  Lisa Roca, MD  clopidogrel (PLAVIX) 75 MG tablet Take 1 tablet (75 mg total) by mouth daily. 11/03/16  Demetrios Loll, MD  furosemide (LASIX) 80 MG tablet Take 80 mg by mouth See admin instructions. Take 1 tablet (80mg ) every morning on non-dialysis days 05/16/18   [provider]  hydrALAZINE (APRESOLINE) 100 MG tablet Take 1 tablet (100 mg total) by mouth 4 (four) times daily. Patient taking differently: Take 100 mg by mouth every 8 (eight) hours.  05/01/17 06/10/18  Lisa Roca, MD  isosorbide dinitrate (ISORDIL) 30 MG tablet Take 30 mg by mouth 3 (three) times daily.     [provider]  Multiple Vitamin  (MULTIVITAMIN) tablet Take 1 tablet by mouth daily. 04/10/18   Henreitta Leber, MD    Allergies Ibuprofen; Viagra [sildenafil]; and Clonidine derivatives  Family History  Problem Relation Age of Onset  . Heart disease Mother   . Hyperlipidemia Mother   . Hypertension Mother   . Kidney disease Father   . Kidney disease Brother   . Hyperlipidemia Sister   . Hyperlipidemia Daughter   . Hypertension Sister   . Hypertension Daughter     Social History Social History   Tobacco Use  . Smoking status: Current Every Day Smoker    Packs/day: 0.25    Years: 25.00    Pack years: 6.25    Types: Cigarettes  . Smokeless tobacco: Never Used  Substance Use Topics  . Alcohol use: No    Alcohol/week: 0.0 standard drinks    Frequency: Never    Comment: occasional  . Drug use: Not Currently    Types: Marijuana    Comment: daily     Review of Systems  Constitutional: No fever/chills Cardiovascular: No chest pain. Respiratory: No SOB. Gastrointestinal: No abdominal pain.  No nausea, no vomiting.  Musculoskeletal: Negative for musculoskeletal pain. Skin: Negative for rash, abrasions, lacerations, ecchymosis.   ____________________________________________   PHYSICAL EXAM:  VITAL SIGNS: ED Triage Vitals  Enc Vitals Group     BP 06/27/18 1240 (!) 173/103     Pulse Rate 06/27/18 1240 71     Resp 06/27/18 1240 18     Temp 06/27/18 1240 97.7 F (36.5 C)     Temp Source 06/27/18 1240 Oral     SpO2 06/27/18 1240 97 %     Weight 06/27/18 1227 207 lb 3.7 oz (94 kg)     Height 06/27/18 1227 6' (1.829 m)     Head Circumference --      Peak Flow --      Pain Score 06/27/18 1242 0     Pain Loc --      Pain Edu? --      Excl. in [place]? --      Constitutional: Alert and oriented. Well appearing and in no acute distress. Eyes: Conjunctivae are normal. PERRL. EOMI. Head: Atraumatic. ENT:      Ears:      Nose: No congestion/rhinnorhea.      Mouth/Throat: Mucous membranes are moist.   Neck: No stridor. Cardiovascular: Normal rate, regular rhythm.  Good peripheral circulation. Respiratory: Normal respiratory effort without tachypnea or retractions. Lungs CTAB. Good air entry to the bases with no decreased or absent breath sounds. Gastrointestinal: Bowel sounds 4 quadrants. Soft and nontender to palpation. No guarding or rigidity. No palpable masses. No distention. Musculoskeletal: Full range of motion to all extremities. No gross deformities appreciated. Neurologic:  Normal speech and language. No gross focal neurologic deficits are appreciated.  Skin:  Skin is warm, dry and intact. No rash noted. Psychiatric: Mood and affect are normal. Speech and  behavior are normal. Patient exhibits appropriate insight and judgement.   ____________________________________________   LABS (all labs ordered are listed, but only abnormal results are displayed)  Labs Reviewed - No data to display ____________________________________________  EKG   ____________________________________________  RADIOLOGY  No results found.  ____________________________________________    PROCEDURES  Procedure(s) performed:    Procedures    Medications - No data to display   ____________________________________________   INITIAL IMPRESSION / ASSESSMENT AND PLAN / ED COURSE  Pertinent labs & imaging results that were available during my care of the patient were reviewed by me and considered in my medical decision making (see chart for details).  Review of the [place] CSRS was performed in accordance of the [place] prior to dispensing any controlled drugs.     Patient presented to emergency department for dialysis.  Dr. Zollie Scale with nephrology was consulted and plans for dialysis today.  Patient has had several episodes of loose stools last night but refuses any blood work in the emergency department.  He is requesting lunch and has not had any loose stools while here.  Patient is  comfortably eating a sandwich and watching television while waiting for dialysis.  Patient is given ED precautions to return to the ED for any worsening or new symptoms.     ____________________________________________  FINAL CLINICAL IMPRESSION(S) / ED DIAGNOSES  Final diagnoses:  None      NEW MEDICATIONS STARTED DURING THIS VISIT:  ED Discharge Orders    None          This chart was dictated using voice recognition software/Dragon. Despite best efforts to proofread, errors can occur which can change the meaning. Any change was purely unintentional.    Laban Emperor, PA-C 06/27/18 1619    Merlyn Lot, MD 06/27/18 [phone]

## 2018-06-27 NOTE — ED Notes (Signed)
Taken to dialysis via W/C   Report to [employer]

## 2018-06-27 NOTE — Progress Notes (Signed)
HD Tx End    06/27/18 1925  Hand-Off documentation  Report given to (Full Name) Caryl Pina RN ED  Report received from (Full Name) Trellis Paganini RN  Vital Signs  Temp 98.8 F (37.1 C)  Temp Source Oral  Pulse Rate 72  Resp 18  BP (!) 168/92  Oxygen Therapy  SpO2 98 %  O2 Device Room Air  During Hemodialysis Assessment  Blood Flow Rate (mL/min) 200 mL/min  Arterial Pressure (mmHg) -80 mmHg  Venous Pressure (mmHg) 90 mmHg  Transmembrane Pressure (mmHg) 50 mmHg  Ultrafiltration Rate (mL/min) 170 mL/min  Dialysate Flow Rate (mL/min) 800 ml/min  Conductivity: Machine  14  HD Safety Checks Performed Yes  Dialysis Fluid Bolus Normal Saline  Bolus Amount (mL) 250 mL  Intra-Hemodialysis Comments Tx completed;Tolerated well

## 2018-06-27 NOTE — ED Notes (Signed)
See triage note  States he is here for dialysis treatment  Last treatment was on Friday  Denies ant other complaints

## 2018-06-27 NOTE — ED Triage Notes (Signed)
Pt states normally goes to [facility] for dialysis but is having diarrhea today that started around 9am and doesn't feel like he would be able to travel that far. Pt was here last Friday for dialysis. Pt states he does not have any transportation issues.

## 2018-06-27 NOTE — ED Notes (Signed)
Pt refuses vitals states they just took them in dialysis

## 2018-06-29 LAB — HEPATITIS PANEL, ACUTE
HCV Ab: <0.1 s/co ratio (ref 0.0–0.9)
Hep A IgM: NEGATIVE
Hep B C IgM: NEGATIVE
Hepatitis B Surface Ag: NEGATIVE

## 2018-07-01 ENCOUNTER — Encounter: Payer: Self-pay | Admitting: *Deleted

## 2018-07-01 ENCOUNTER — Other Ambulatory Visit: Payer: Self-pay

## 2018-07-01 ENCOUNTER — Non-Acute Institutional Stay
Admission: EM | Admit: 2018-07-01 | Discharge: 2018-07-02 | Disposition: A | Discharge status: Home / Self Care | Payer: Medicare Other | Attending: Emergency Medicine | Admitting: Emergency Medicine

## 2018-07-01 DIAGNOSIS — J449 Chronic obstructive pulmonary disease, unspecified: Secondary | ICD-10-CM | POA: Insufficient documentation

## 2018-07-01 DIAGNOSIS — Z8673 Personal history of transient ischemic attack (TIA), and cerebral infarction without residual deficits: Secondary | ICD-10-CM | POA: Insufficient documentation

## 2018-07-01 DIAGNOSIS — E785 Hyperlipidemia, unspecified: Secondary | ICD-10-CM | POA: Diagnosis not present

## 2018-07-01 DIAGNOSIS — Z7902 Long term (current) use of antithrombotics/antiplatelets: Secondary | ICD-10-CM | POA: Diagnosis not present

## 2018-07-01 DIAGNOSIS — Z8249 Family history of ischemic heart disease and other diseases of the circulatory system: Secondary | ICD-10-CM | POA: Insufficient documentation

## 2018-07-01 DIAGNOSIS — F17210 Nicotine dependence, cigarettes, uncomplicated: Secondary | ICD-10-CM | POA: Diagnosis not present

## 2018-07-01 DIAGNOSIS — Z79899 Other long term (current) drug therapy: Secondary | ICD-10-CM | POA: Diagnosis not present

## 2018-07-01 DIAGNOSIS — Z992 Dependence on renal dialysis: Secondary | ICD-10-CM | POA: Diagnosis not present

## 2018-07-01 DIAGNOSIS — I2510 Atherosclerotic heart disease of native coronary artery without angina pectoris: Secondary | ICD-10-CM | POA: Diagnosis not present

## 2018-07-01 DIAGNOSIS — N2581 Secondary hyperparathyroidism of renal origin: Secondary | ICD-10-CM | POA: Diagnosis not present

## 2018-07-01 DIAGNOSIS — I120 Hypertensive chronic kidney disease with stage 5 chronic kidney disease or end stage renal disease: Secondary | ICD-10-CM | POA: Diagnosis present

## 2018-07-01 DIAGNOSIS — N186 End stage renal disease: Secondary | ICD-10-CM | POA: Diagnosis not present

## 2018-07-01 DIAGNOSIS — Z955 Presence of coronary angioplasty implant and graft: Secondary | ICD-10-CM | POA: Diagnosis not present

## 2018-07-01 DIAGNOSIS — I252 Old myocardial infarction: Secondary | ICD-10-CM | POA: Insufficient documentation

## 2018-07-01 DIAGNOSIS — G4730 Sleep apnea, unspecified: Secondary | ICD-10-CM | POA: Insufficient documentation

## 2018-07-01 LAB — CBC
HCT: 31.2 % — ABNORMAL LOW (ref 39.0–52.0)
Hemoglobin: 10.1 g/dL — ABNORMAL LOW (ref 13.0–17.0)
MCH: 31.4 pg (ref 26.0–34.0)
MCHC: 32.4 g/dL (ref 30.0–36.0)
MCV: 96.9 fL (ref 80.0–100.0)
Platelets: 197 10*3/uL (ref 150–400)
RBC: 3.22 MIL/uL — ABNORMAL LOW (ref 4.22–5.81)
RDW: 15.1 % (ref 11.5–15.5)
WBC: 5.7 10*3/uL (ref 4.0–10.5)
nRBC: 0 % (ref 0.0–0.2)

## 2018-07-01 LAB — RENAL FUNCTION PANEL
Albumin: 3.7 g/dL (ref 3.5–5.0)
Anion gap: 18 — ABNORMAL HIGH (ref 5–15)
BUN: 76 mg/dL — ABNORMAL HIGH (ref 6–20)
CO2: 22 mmol/L (ref 22–32)
Calcium: 8.1 mg/dL — ABNORMAL LOW (ref 8.9–10.3)
Chloride: 101 mmol/L (ref 98–111)
Creatinine, Ser: 20.09 mg/dL — ABNORMAL HIGH (ref 0.61–1.24)
GFR calc Af Amer: 3 mL/min — ABNORMAL LOW (ref >60)
GFR calc non Af Amer: 2 mL/min — ABNORMAL LOW (ref >60)
Glucose, Bld: 100 mg/dL — ABNORMAL HIGH (ref 70–99)
Phosphorus: 7.2 mg/dL — ABNORMAL HIGH (ref 2.5–4.6)
Potassium: 4.7 mmol/L (ref 3.5–5.1)
Sodium: 141 mmol/L (ref 135–145)

## 2018-07-01 MED ORDER — SODIUM CHLORIDE 0.9 % IV SOLN: 100.0000 mL | INTRAVENOUS | Status: DC | PRN

## 2018-07-01 MED ORDER — PENTAFLUOROPROP-TETRAFLUOROETH EX AERO: 1.0000 "application " | INHALATION_SPRAY | CUTANEOUS | Status: DC | PRN

## 2018-07-01 MED ORDER — CHLORHEXIDINE GLUCONATE CLOTH 2 % EX PADS
6.0000 | MEDICATED_PAD | Freq: Every day | CUTANEOUS | Status: DC
  Filled 2018-07-01 (×2): qty 6

## 2018-07-01 MED ORDER — ALTEPLASE 2 MG IJ SOLR
2.0000 mg | Freq: Once | INTRAMUSCULAR | Status: DC | PRN
  Filled 2018-07-01: qty 2

## 2018-07-01 MED ORDER — LIDOCAINE-PRILOCAINE 2.5-2.5 % EX CREA: 1.0000 "application " | TOPICAL_CREAM | CUTANEOUS | Status: DC | PRN

## 2018-07-01 MED ORDER — HEPARIN SODIUM (PORCINE) 1000 UNIT/ML DIALYSIS
1000.0000 [IU] | INTRAMUSCULAR | Status: DC | PRN
  Filled 2018-07-01: qty 1

## 2018-07-01 MED ORDER — LIDOCAINE HCL (PF) 1 % IJ SOLN: 5.0000 mL | INTRAMUSCULAR | Status: DC | PRN

## 2018-07-01 NOTE — ED Provider Notes (Signed)
[facility] Emergency Department Provider Note  ____________________________________________  Time seen: Approximately 3:31 PM  I have reviewed the triage vital signs and the nursing notes.   HISTORY  Chief Complaint No chief complaint on file.    HPI Fernando Salas is a 48 y.o. male who presents the emergency department for dialysis.  Patient reports that he is supposed to have dialysis Monday Wednesday and Friday.  The patient states that he typically receives dialysis in [place].  On review of medical records, patient frequents multiple hospital systems for dialysis rather than go to the outpatient center.  Patient states that today he just "did not feel like going."  He reports that he gets short of breath and "swells up" when he has to travel.  Patient presents emergency department requesting dialysis.  Patient adamantly refuses any labs or imaging at this time.  He states "I am just here so you will give you my dialysis."         Past Medical History:  Diagnosis Date  . CAD (coronary artery disease)    status post multiple myocardial infarctions  . COPD (chronic obstructive pulmonary disease) ([place])   . ESRD (end stage renal disease) ([place])   . Hyperlipidemia   . Hypertension   . Myocardial infarction ([place])    " I had 13 heart attacks, 8 massive"  . Proteinuria   . Renal insufficiency   . Secondary hyperparathyroidism of renal origin ([place])   . Shortness of breath dyspnea    " when I drink a cup of cold water too fast"  . Sleep apnea    pt stated " long story" does not wear CPAP  . Stroke [facility])     Patient Active Problem List   Diagnosis Date Noted  . ESRD (end stage renal disease) on dialysis ([place]) 06/27/2018  . Hypertensive urgency, malignant 04/08/2018  . Hypertensive urgency 03/25/2018  . Pulmonary edema 03/09/2017  . 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 Fluid overload 02/08/2017  . Accelerated hypertension 01/12/2017  . AKI  (acute kidney injury) ([place])   . Unstable angina ([place]) 11/01/2016    Past Surgical History:  Procedure Laterality Date  . AV FISTULA PLACEMENT Left 01-24-14   By Dr. Hortencia Pilar in [place]  . AV FISTULA PLACEMENT Right 04/30/2014   Procedure: RIGHT BRACHIOCEPHALIC ARTERIOVENOUS (AV) FISTULA CREATION;  Surgeon: Elam Dutch, MD;  Location: [facility] OR;  Service: Vascular;  Laterality: Right;  . AV FISTULA PLACEMENT Left 05/31/2014   Procedure: Left INSERTION OF ARTERIOVENOUS (AV) GORE-TEX GRAFT ARM;  Surgeon: Elam Dutch, MD;  Location: [place];  Service: Vascular;  Laterality: Left;  . CARDIAC CATHETERIZATION    . CORONARY STENT PLACEMENT     13 stents total  . DIALYSIS/PERMA CATHETER INSERTION N/A 01/15/2017   Procedure: DIALYSIS/PERMA CATHETER INSERTION;  Surgeon: Katha Cabal, MD;  Location: [place] CV LAB;  Service: Cardiovascular;  Laterality: N/A;    Prior to Admission medications   Medication Sig Start Date End Date Taking? Authorizing Provider  amLODipine (NORVASC) 10 MG tablet Take 1 tablet (10 mg total) by mouth daily. 05/01/17 07/01/18 Yes Lisa Roca, MD  atorvastatin (LIPITOR) 40 MG tablet Take 1 tablet (40 mg total) by mouth daily. 11/03/16  Yes Demetrios Loll, MD  calcium acetate (PHOSLO) 667 MG capsule Take 1,334 mg by mouth 3 (three) times daily with meals. 12/03/17 12/03/18 Yes [provider]  carvedilol (COREG) 25 MG tablet Take 1 tablet (25 mg total)  by mouth 2 (two) times daily. 05/01/17 07/01/18 Yes Lisa Roca, MD  clopidogrel (PLAVIX) 75 MG tablet Take 1 tablet (75 mg total) by mouth daily. 11/03/16  Yes Demetrios Loll, MD  furosemide (LASIX) 80 MG tablet Take 80 mg by mouth See admin instructions. Take 1 tablet (80mg ) every morning on non-dialysis days 05/16/18  Yes [provider]  hydrALAZINE (APRESOLINE) 100 MG tablet Take 1 tablet (100 mg total) by mouth 4 (four) times daily. Patient taking differently: Take 100 mg by mouth every 8 (eight)  hours.  05/01/17 07/01/18 Yes Lisa Roca, MD  isosorbide dinitrate (ISORDIL) 30 MG tablet Take 30 mg by mouth 3 (three) times daily.    Yes [provider]  Multiple Vitamin (MULTIVITAMIN) tablet Take 1 tablet by mouth daily. 04/10/18  Yes Henreitta Leber, MD    Allergies Ibuprofen; Viagra [sildenafil]; and Clonidine derivatives  Family History  Problem Relation Age of Onset  . Heart disease Mother   . Hyperlipidemia Mother   . Hypertension Mother   . Kidney disease Father   . Kidney disease Brother   . Hyperlipidemia Sister   . Hyperlipidemia Daughter   . Hypertension Sister   . Hypertension Daughter     Social History Social History   Tobacco Use  . Smoking status: Current Every Day Smoker    Packs/day: 0.25    Years: 25.00    Pack years: 6.25    Types: Cigarettes  . Smokeless tobacco: Never Used  Substance Use Topics  . Alcohol use: No    Alcohol/week: 0.0 standard drinks    Frequency: Never    Comment: occasional  . Drug use: Not Currently    Types: Marijuana    Comment: daily     Review of Systems  Constitutional: No fever/chills Eyes: No visual changes. No discharge ENT: No upper respiratory complaints. Cardiovascular: no chest pain. Respiratory: no cough.  Positive SOB with "traveling.". Gastrointestinal: No abdominal pain.  No nausea, no vomiting.  No diarrhea.  No constipation. Genitourinary: Negative for dysuria. No hematuria Musculoskeletal: Negative for musculoskeletal pain. Skin: Negative for rash, abrasions, lacerations, ecchymosis. Neurological: Negative for headaches, focal weakness or numbness. 10-point ROS otherwise negative.  ____________________________________________   PHYSICAL EXAM:  VITAL SIGNS: ED Triage Vitals  Enc Vitals Group     BP 07/01/18 1321 (!) 150/97     Pulse Rate 07/01/18 1321 69     Resp 07/01/18 1321 17     Temp 07/01/18 1320 97.9 F (36.6 C)     Temp Source 07/01/18 1320 Oral     SpO2 07/01/18 1321 97  %     Weight 07/01/18 1322 209 lb 10.5 oz (95.1 kg)     Height 07/01/18 1322 6' (1.829 m)     Head Circumference --      Peak Flow --      Pain Score 07/01/18 1321 0     Pain Loc --      Pain Edu? --      Excl. in [place]? --      Constitutional: Alert and oriented. Well appearing and in no acute distress. Eyes: Conjunctivae are normal. PERRL. EOMI. Head: Atraumatic. ENT:      Ears:       Nose: No congestion/rhinnorhea.      Mouth/Throat: Mucous membranes are moist.  Neck: No stridor.   Hematological/Lymphatic/Immunilogical: No cervical lymphadenopathy. Cardiovascular: Normal rate, regular rhythm. Normal S1 and S2.  Good peripheral circulation. Respiratory: Normal respiratory effort without tachypnea or retractions.  Lungs CTAB. Good air entry to the bases with no decreased or absent breath sounds. Musculoskeletal: Full range of motion to all extremities. No gross deformities appreciated. Neurologic:  Normal speech and language. No gross focal neurologic deficits are appreciated.  Skin:  Skin is warm, dry and intact. No rash noted. Psychiatric: Mood and affect are normal. Speech and behavior are normal. Patient exhibits appropriate insight and judgement.   ____________________________________________   LABS (all labs ordered are listed, but only abnormal results are displayed)  Labs Reviewed  RENAL FUNCTION PANEL - Abnormal; Notable for the following components:      Result Value   Glucose, Bld 100 (*)    BUN 76 (*)    Creatinine, Ser 20.09 (*)    Calcium 8.1 (*)    Phosphorus 7.2 (*)    GFR calc non Af Amer 2 (*)    GFR calc Af Amer 3 (*)    Anion gap 18 (*)    All other components within normal limits  CBC - Abnormal; Notable for the following components:   RBC 3.22 (*)    Hemoglobin 10.1 (*)    HCT 31.2 (*)    All other components within normal limits    ____________________________________________  EKG   ____________________________________________  RADIOLOGY   No results found.  ____________________________________________    PROCEDURES  Procedure(s) performed:    Procedures    Medications  Chlorhexidine Gluconate Cloth 2 % PADS 6 each (has no administration in time range)  pentafluoroprop-tetrafluoroeth (GEBAUERS) aerosol 1 application (has no administration in time range)  lidocaine (PF) (XYLOCAINE) 1 % injection 5 mL (has no administration in time range)  lidocaine-prilocaine (EMLA) cream 1 application (has no administration in time range)  0.9 %  sodium chloride infusion (has no administration in time range)  0.9 %  sodium chloride infusion (has no administration in time range)  heparin injection 1,000 Units (has no administration in time range)  alteplase (CATHFLO ACTIVASE) injection 2 mg (has no administration in time range)     ____________________________________________   INITIAL IMPRESSION / ASSESSMENT AND PLAN / ED COURSE  Pertinent labs & imaging results that were available during my care of the patient were reviewed by me and considered in my medical decision making (see chart for details).  Review of the [place] CSRS was performed in accordance of the [place] prior to dispensing any controlled drugs.           Patient's diagnosis is consistent with end-stage renal disease needing dialysis.  Patient presented to the emergency department requesting dialysis.  Patient is on a Monday, Wednesday, Friday schedule.  Patient's last dialysis was on 06/27/2018 here in this department.  Discussed the case with on-call nephrologist who advises they will dialyze the patient here.  Patient has been dialyzed and is stable for discharge at this time..  Patient is to follow-up with nephrology as needed.  Patient is given ED precautions to return to the ED for any worsening or new  symptoms.     ____________________________________________  FINAL CLINICAL IMPRESSION(S) / ED DIAGNOSES  Final diagnoses:  ESRD (end stage renal disease) on dialysis ([place])      NEW MEDICATIONS STARTED DURING THIS VISIT:  ED Discharge Orders    None          This chart was dictated using voice recognition software/Dragon. Despite best efforts to proofread, errors can occur which can change the meaning. Any change was purely unintentional.    Darletta Moll, PA-C 07/01/18  2120    Delman Kitten, MD 07/01/18 2357

## 2018-07-01 NOTE — ED Notes (Signed)
First Nurse Note: Pt here for dialysis. Last treatment was on Monday.

## 2018-07-01 NOTE — ED Notes (Addendum)
Pt transported to dialysis per this RN.

## 2018-07-01 NOTE — Progress Notes (Signed)
Post HD Tx    07/01/18 2145  Hand-Off documentation  Report given to (Full Name) ED RN   Report received from (Full Name) Trellis Paganini RN  Vital Signs  Temp 98.4 F (36.9 C)  Temp Source Oral  Pulse Rate 62  Pulse Rate Source Monitor  Resp 16  BP (!) 165/98  BP Location Right Arm  BP Method Automatic  Patient Position (if appropriate) Sitting  Oxygen Therapy  SpO2 98 %  O2 Device Nasal Cannula  Post-Hemodialysis Assessment  Rinseback Volume (mL) 250 mL  Dialyzer Clearance Heavily streaked  Duration of HD Treatment -hour(s) 3.5 hour(s)  Hemodialysis Intake (mL) 500 mL  UF Total -Machine (mL) 1000 mL  Net UF (mL) 500 mL  Tolerated HD Treatment Yes  Hemodialysis Catheter Right Subclavian Double-lumen  Placement Date/Time: (c) 02/10/17 1343   Placed prior to admission: Yes  Orientation: Right  Access Location: Subclavian  Hemodialysis Catheter Type: Double-lumen  Site Condition No complications  Blue Lumen Status Flushed;Saline locked;Capped (Central line);Heparin locked  Red Lumen Status Flushed;Saline locked;Capped (Central line);Heparin locked  Purple Lumen Status N/A  Catheter fill solution Heparin 1000 units/ml  Catheter fill volume (Arterial) 2.4 cc  Catheter fill volume (Venous) 2.4  Dressing Type Occlusive  Dressing Status Clean;Dry;Intact  Interventions  (refused dressing changed)  Drainage Description None  Post treatment catheter status Capped and Clamped

## 2018-07-01 NOTE — Progress Notes (Signed)
HD Tx End   07/01/18 2130  Vital Signs  Pulse Rate 65  Resp 18  BP (!) 164/102  Oxygen Therapy  SpO2 99 %  Pain Assessment  Pain Scale 0-10  Pain Score 0  Dialysis Weight  Weight 94.4 kg  Type of Weight Post-Dialysis  During Hemodialysis Assessment  Blood Flow Rate (mL/min) 200 mL/min  Arterial Pressure (mmHg) -80 mmHg  Venous Pressure (mmHg) 60 mmHg  Transmembrane Pressure (mmHg) 50 mmHg  Ultrafiltration Rate (mL/min) 280 mL/min  Dialysate Flow Rate (mL/min) 800 ml/min  Conductivity: Machine  14  HD Safety Checks Performed Yes  Dialysis Fluid Bolus Normal Saline  Bolus Amount (mL) 250 mL  Intra-Hemodialysis Comments Tx completed

## 2018-07-01 NOTE — Progress Notes (Signed)
Post HD Assessment  0.5 liters removed, pt doing well upon return to ED. BP elevated, pt reports he will take meds when he returns home.     07/01/18 2145  Neurological  Level of Consciousness Alert  Orientation Level Oriented X4  Respiratory  Respiratory Pattern Regular  Chest Assessment Chest expansion symmetrical  Bilateral Breath Sounds Clear  Cardiac  Pulse Regular  Heart Sounds S1, S2  Jugular Venous Distention (JVD) No  ECG Monitor No (pt refused monitor)  Vascular  R Radial Pulse +2  L Radial Pulse +2  Edema Generalized  Integumentary  Integumentary (WDL) X  Skin Color Appropriate for ethnicity  Skin Condition Dry  Musculoskeletal  Musculoskeletal (WDL) WDL  Gastrointestinal  Bowel Sounds Assessment Active  GU Assessment  Genitourinary (WDL) X  Genitourinary Symptoms Other (Comment) (Hd pt)  Psychosocial  Psychosocial (WDL) WDL

## 2018-07-01 NOTE — ED Notes (Signed)
Per pt request, pt escorted to cafeteria via EDT, Waunita Schooner.

## 2018-07-01 NOTE — Progress Notes (Signed)
Pre HD Tx   Pt from ED with ED RN. BP elevated as per usual with this pt pre HD - pt voices he will take meds post tx upon returning home. Will monitor closely during HD tx. 0.5 liter fluid removal today, d/t pt being under EDW, pt expressed having had diarrhea the past couple of days, as well as concern for cramping when pulling more than this when dehydrated.   07/01/18 1749  Hand-Off documentation  Report given to (Full Name) Trellis Paganini RN  Report received from (Full Name) Jena Gauss RN  Vital Signs  Temp 97.8 F (36.6 C)  Temp Source Oral  Pulse Rate 70  Pulse Rate Source Monitor  Resp 16  BP (!) 156/111  BP Location Right Arm  BP Method Automatic  Patient Position (if appropriate) Sitting  Oxygen Therapy  SpO2 98 %  O2 Device Room Air  Patient Activity (if Appropriate) In chair  Pulse Oximetry Type Continuous  Pain Assessment  Pain Scale 0-10  Pain Score 0  Dialysis Weight  Weight 95.1 kg  Type of Weight Pre-Dialysis  Time-Out for Hemodialysis  What Procedure? Hemodialysis  Pt Identifiers(min of two) First/Last Name;MRN/Account#  Correct Site? Yes  Correct Side? Yes  Correct Procedure? Yes  Consents Verified? Yes  Rad Studies Available? N/A  Safety Precautions Reviewed? Yes  Engineer, civil (consulting) Number 5  Station Number 3  UF/Alarm Test Passed  Conductivity: Meter 14  Conductivity: Machine  13.8  pH 7.2  Reverse Osmosis Main  Normal Saline Lot Number [id number]  Dialyzer Lot Number [id number]  Disposable Set Lot Number [id number]  Machine Temperature 98.6 F (37 C)  Musician and Audible Yes  Blood Lines Intact and Secured Yes  Pre Treatment Patient Checks  Vascular access used during treatment Catheter  Patient is receiving dialysis in a chair Yes  Hepatitis B Surface Antigen Results Negative  Date Hepatitis B Surface Antigen Drawn 06/27/18  Hepatitis B Surface Antibody  (<10)  Date Hepatitis B Surface Antibody Drawn 06/27/18  Hemodialysis  Consent Verified Yes  Hemodialysis Standing Orders Initiated Yes  ECG (Telemetry) Monitor On No (pt refused)  Prime Ordered Normal Saline  Length of  DialysisTreatment -hour(s) 3.5 Hour(s)  Dialysis Treatment Comments Na140   Dialyzer Elisio 17H NR  Dialysate 2K, 2.5 Ca  Dialysate Flow Ordered 800  Blood Flow Rate Ordered 400 mL/min  Ultrafiltration Goal 0.5 Liters (pt under EDW, does not feel comfortable pulling fluid)  Pre Treatment Labs Renal panel;CBC  Dialysis Blood Pressure Support Ordered Normal Saline  Education / Care Plan  Dialysis Education Provided Yes  Documented Education in Care Plan Yes  Hemodialysis Catheter Right Subclavian Double-lumen  Placement Date/Time: (c) 02/10/17 1343   Placed prior to admission: Yes  Orientation: Right  Access Location: Subclavian  Hemodialysis Catheter Type: Double-lumen  Site Condition No complications  Blue Lumen Status Capped (Central line)  Red Lumen Status Capped (Central line)  Purple Lumen Status N/A  Dressing Type Occlusive  Dressing Status Clean;Dry;Intact  Interventions Other (Comment) (pt refused dressing change)  Drainage Description None

## 2018-07-01 NOTE — Progress Notes (Signed)
Pre HD Assessment  Pt from ED with ED RN. BP elevated as per usual with this pt pre HD - pt voices he will take meds post tx upon returning home. Will monitor closely during HD tx. 0.5 liter fluid removal today, d/t pt being under EDW, pt expressed having had diarrhea the past couple of days, as well as concern for cramping when pulling more than this when dehydrated.    07/01/18 1745  Neurological  Level of Consciousness Alert  Orientation Level Oriented X4  Respiratory  Respiratory Pattern Regular  Chest Assessment Chest expansion symmetrical  Bilateral Breath Sounds Clear  Cardiac  Pulse Regular  Heart Sounds S1, S2  Jugular Venous Distention (JVD) No  ECG Monitor No (pt refused monitor)  Vascular  R Radial Pulse +2  L Radial Pulse +2  Edema Generalized  Integumentary  Integumentary (WDL) X  Skin Color Appropriate for ethnicity  Skin Condition Dry  Musculoskeletal  Musculoskeletal (WDL) WDL  Gastrointestinal  Bowel Sounds Assessment Active  GU Assessment  Genitourinary (WDL) X  Genitourinary Symptoms Other (Comment) (Hd pt)  Psychosocial  Psychosocial (WDL) WDL

## 2018-07-01 NOTE — Progress Notes (Signed)
HD Tx Start    07/01/18 1750  Vital Signs  Pulse Rate 72  Pulse Rate Source Monitor  Resp 18  BP (!) 154/112  BP Location Right Arm  BP Method Automatic  Patient Position (if appropriate) Sitting  Oxygen Therapy  SpO2 98 %  O2 Device Room Air  During Hemodialysis Assessment  Blood Flow Rate (mL/min) 400 mL/min  Arterial Pressure (mmHg) -180 mmHg  Venous Pressure (mmHg) 160 mmHg  Transmembrane Pressure (mmHg) 40 mmHg  Ultrafiltration Rate (mL/min) 280 mL/min (250 mL per HOUR)  Dialysate Flow Rate (mL/min) 800 ml/min  Conductivity: Machine  14  HD Safety Checks Performed Yes  Dialysis Fluid Bolus Normal Saline  Bolus Amount (mL) 250 mL  Intra-Hemodialysis Comments Tx initiated  Hemodialysis Catheter Right Subclavian Double-lumen  Placement Date/Time: (c) 02/10/17 1343   Placed prior to admission: Yes  Orientation: Right  Access Location: Subclavian  Hemodialysis Catheter Type: Double-lumen  Site Condition No complications  Blue Lumen Status Infusing  Red Lumen Status Infusing  Purple Lumen Status N/A  Dressing Type Occlusive  Dressing Status Clean;Dry;Intact  Interventions  (pt refused dressing change)  Drainage Description None

## 2018-07-08 ENCOUNTER — Emergency Department: Payer: Medicare Other

## 2018-07-08 ENCOUNTER — Other Ambulatory Visit: Payer: Self-pay

## 2018-07-08 ENCOUNTER — Encounter: Payer: Self-pay | Admitting: Emergency Medicine

## 2018-07-08 ENCOUNTER — Inpatient Hospital Stay
Admission: EM | Admit: 2018-07-08 | Discharge: 2018-07-08 | DRG: 189 | Discharge status: Against Medical Advice | Payer: Medicare Other | Attending: Internal Medicine | Admitting: Internal Medicine

## 2018-07-08 DIAGNOSIS — F17210 Nicotine dependence, cigarettes, uncomplicated: Secondary | ICD-10-CM | POA: Diagnosis present

## 2018-07-08 DIAGNOSIS — J810 Acute pulmonary edema: Secondary | ICD-10-CM | POA: Diagnosis present

## 2018-07-08 DIAGNOSIS — G4730 Sleep apnea, unspecified: Secondary | ICD-10-CM | POA: Diagnosis present

## 2018-07-08 DIAGNOSIS — Z9114 Patient's other noncompliance with medication regimen: Secondary | ICD-10-CM

## 2018-07-08 DIAGNOSIS — E875 Hyperkalemia: Secondary | ICD-10-CM

## 2018-07-08 DIAGNOSIS — Z841 Family history of disorders of kidney and ureter: Secondary | ICD-10-CM

## 2018-07-08 DIAGNOSIS — Z886 Allergy status to analgesic agent status: Secondary | ICD-10-CM | POA: Diagnosis not present

## 2018-07-08 DIAGNOSIS — Z992 Dependence on renal dialysis: Secondary | ICD-10-CM

## 2018-07-08 DIAGNOSIS — J449 Chronic obstructive pulmonary disease, unspecified: Secondary | ICD-10-CM | POA: Diagnosis present

## 2018-07-08 DIAGNOSIS — Z888 Allergy status to other drugs, medicaments and biological substances status: Secondary | ICD-10-CM

## 2018-07-08 DIAGNOSIS — Z8249 Family history of ischemic heart disease and other diseases of the circulatory system: Secondary | ICD-10-CM

## 2018-07-08 DIAGNOSIS — Z66 Do not resuscitate: Secondary | ICD-10-CM | POA: Diagnosis present

## 2018-07-08 DIAGNOSIS — Z7902 Long term (current) use of antithrombotics/antiplatelets: Secondary | ICD-10-CM

## 2018-07-08 DIAGNOSIS — I160 Hypertensive urgency: Secondary | ICD-10-CM | POA: Diagnosis present

## 2018-07-08 DIAGNOSIS — N2581 Secondary hyperparathyroidism of renal origin: Secondary | ICD-10-CM | POA: Diagnosis present

## 2018-07-08 DIAGNOSIS — E785 Hyperlipidemia, unspecified: Secondary | ICD-10-CM | POA: Diagnosis present

## 2018-07-08 DIAGNOSIS — I2510 Atherosclerotic heart disease of native coronary artery without angina pectoris: Secondary | ICD-10-CM | POA: Diagnosis present

## 2018-07-08 DIAGNOSIS — Z955 Presence of coronary angioplasty implant and graft: Secondary | ICD-10-CM | POA: Diagnosis not present

## 2018-07-08 DIAGNOSIS — I5032 Chronic diastolic (congestive) heart failure: Secondary | ICD-10-CM | POA: Diagnosis present

## 2018-07-08 DIAGNOSIS — Z9115 Patient's noncompliance with renal dialysis: Secondary | ICD-10-CM

## 2018-07-08 DIAGNOSIS — Z8673 Personal history of transient ischemic attack (TIA), and cerebral infarction without residual deficits: Secondary | ICD-10-CM

## 2018-07-08 DIAGNOSIS — N186 End stage renal disease: Secondary | ICD-10-CM | POA: Diagnosis present

## 2018-07-08 DIAGNOSIS — D631 Anemia in chronic kidney disease: Secondary | ICD-10-CM | POA: Diagnosis present

## 2018-07-08 DIAGNOSIS — J811 Chronic pulmonary edema: Secondary | ICD-10-CM | POA: Diagnosis present

## 2018-07-08 DIAGNOSIS — I252 Old myocardial infarction: Secondary | ICD-10-CM | POA: Diagnosis not present

## 2018-07-08 DIAGNOSIS — I132 Hypertensive heart and chronic kidney disease with heart failure and with stage 5 chronic kidney disease, or end stage renal disease: Secondary | ICD-10-CM | POA: Diagnosis present

## 2018-07-08 DIAGNOSIS — F602 Antisocial personality disorder: Secondary | ICD-10-CM | POA: Diagnosis present

## 2018-07-08 DIAGNOSIS — Z79899 Other long term (current) drug therapy: Secondary | ICD-10-CM

## 2018-07-08 LAB — CBC WITH DIFFERENTIAL/PLATELET
Abs Immature Granulocytes: 0.03 10*3/uL (ref 0.00–0.07)
Basophils Absolute: 0 10*3/uL (ref 0.0–0.1)
Basophils Relative: 0 %
Eosinophils Absolute: 0.1 10*3/uL (ref 0.0–0.5)
Eosinophils Relative: 1 %
HCT: 31.1 % — ABNORMAL LOW (ref 39.0–52.0)
Hemoglobin: 10.1 g/dL — ABNORMAL LOW (ref 13.0–17.0)
Immature Granulocytes: 0 %
Lymphocytes Relative: 12 %
Lymphs Abs: 0.9 10*3/uL (ref 0.7–4.0)
MCH: 31.8 pg (ref 26.0–34.0)
MCHC: 32.5 g/dL (ref 30.0–36.0)
MCV: 97.8 fL (ref 80.0–100.0)
Monocytes Absolute: 0.6 10*3/uL (ref 0.1–1.0)
Monocytes Relative: 9 %
Neutro Abs: 5.3 10*3/uL (ref 1.7–7.7)
Neutrophils Relative %: 78 %
Platelets: 188 10*3/uL (ref 150–400)
RBC: 3.18 MIL/uL — ABNORMAL LOW (ref 4.22–5.81)
RDW: 15.2 % (ref 11.5–15.5)
WBC: 6.9 10*3/uL (ref 4.0–10.5)
nRBC: 0 % (ref 0.0–0.2)

## 2018-07-08 LAB — BASIC METABOLIC PANEL
Anion gap: 14 (ref 5–15)
BUN: 82 mg/dL — ABNORMAL HIGH (ref 6–20)
CO2: 20 mmol/L — ABNORMAL LOW (ref 22–32)
Calcium: 8.8 mg/dL — ABNORMAL LOW (ref 8.9–10.3)
Chloride: 106 mmol/L (ref 98–111)
Creatinine, Ser: 17.19 mg/dL — ABNORMAL HIGH (ref 0.61–1.24)
GFR calc Af Amer: 3 mL/min — ABNORMAL LOW (ref >60)
GFR calc non Af Amer: 3 mL/min — ABNORMAL LOW (ref >60)
Glucose, Bld: 97 mg/dL (ref 70–99)
Potassium: 6.3 mmol/L (ref 3.5–5.1)
Sodium: 140 mmol/L (ref 135–145)

## 2018-07-08 MED ORDER — CALCIUM GLUCONATE 10 % IV SOLN
1.0000 g | Freq: Once | INTRAVENOUS | Status: AC
  Administered 2018-07-08: 1 g via INTRAVENOUS
  Filled 2018-07-08: qty 10

## 2018-07-08 MED ORDER — CHLORHEXIDINE GLUCONATE CLOTH 2 % EX PADS: 6.0000 | MEDICATED_PAD | Freq: Every day | CUTANEOUS | Status: DC

## 2018-07-08 MED ORDER — EPOETIN ALFA 10000 UNIT/ML IJ SOLN
10000.0000 [IU] | Freq: Once | INTRAMUSCULAR | Status: AC
  Administered 2018-07-08: 10000 [IU] via INTRAVENOUS

## 2018-07-08 MED ORDER — SODIUM BICARBONATE 8.4 % IV SOLN
50.0000 meq | Freq: Once | INTRAVENOUS | Status: AC
  Administered 2018-07-08: 50 meq via INTRAVENOUS
  Filled 2018-07-08: qty 50

## 2018-07-08 NOTE — ED Notes (Signed)
ED TO INPATIENT HANDOFF REPORT  ED Nurse Name and Phone #: Anderson Malta [phone]  S Name/Age/Gender Fernando Salas 48 y.o. male Room/Bed: HD03AR/HD03AR  Code Status   Code Status: Prior  Home/SNF/Other Home Patient oriented to: self, place, time and situation Is this baseline? Yes   Triage Complete: Triage complete  Chief Complaint Hyperkalemia [E87.5] Acute pulmonary edema ([place]) [J81.0] End-stage renal disease on hemodialysis ([place]) [N18.6, Z99.2]  Triage Note Pt to ED via POV c/o shortness of breath. Pt states that he missed his dialysis treatment on Wednesday. Pt states that shortness of breath started this morning. Pt is in NAD.    Allergies Allergies  Allergen Reactions  . Ibuprofen Other (See Comments)    Kidney   . Viagra [Sildenafil]     Lips swell   . Clonidine Derivatives Rash    Level of Care/Admitting Diagnosis ED Disposition    ED Disposition Condition Comment   Admit  The patient appears reasonably stabilized for admission considering the current resources, flow, and capabilities available in the ED at this time, and I doubt any other [facility] requiring further screening and/or treatment in the ED prior to admission is  present.       B Medical/Surgery History Past Medical History:  Diagnosis Date  . CAD (coronary artery disease)    status post multiple myocardial infarctions  . COPD (chronic obstructive pulmonary disease) ([place])   . ESRD (end stage renal disease) ([place])   . Hyperlipidemia   . Hypertension   . Myocardial infarction ([place])    " I had 13 heart attacks, 8 massive"  . Proteinuria   . Renal insufficiency   . Secondary hyperparathyroidism of renal origin ([place])   . Shortness of breath dyspnea    " when I drink a cup of cold water too fast"  . Sleep apnea    pt stated " long story" does not wear CPAP  . Stroke [facility])    Past Surgical History:  Procedure Laterality Date  . AV FISTULA PLACEMENT Left 01-24-14   By Dr. Hortencia Pilar in  [place]  . AV FISTULA PLACEMENT Right 04/30/2014   Procedure: RIGHT BRACHIOCEPHALIC ARTERIOVENOUS (AV) FISTULA CREATION;  Surgeon: Elam Dutch, MD;  Location: [facility] OR;  Service: Vascular;  Laterality: Right;  . AV FISTULA PLACEMENT Left 05/31/2014   Procedure: Left INSERTION OF ARTERIOVENOUS (AV) GORE-TEX GRAFT ARM;  Surgeon: Elam Dutch, MD;  Location: [facility];  Service: Vascular;  Laterality: Left;  . CARDIAC CATHETERIZATION    . CORONARY STENT PLACEMENT     13 stents total  . DIALYSIS/PERMA CATHETER INSERTION N/A 01/15/2017   Procedure: DIALYSIS/PERMA CATHETER INSERTION;  Surgeon: Katha Cabal, MD;  Location: [facility] LAB;  Service: Cardiovascular;  Laterality: N/A;     A IV Location/Drains/Wounds Patient Lines/Drains/Airways Status   Active Line/Drains/Airways    Name:   Placement date:   Placement time:   Site:   Days:   Peripheral IV 07/08/18 Left Wrist   07/08/18    1206    Wrist   less than 1   Fistula / Graft Right Upper arm Arteriovenous fistula   04/30/14    0826    Upper arm   1530   Fistula / Graft Left Upper arm Arteriovenous vein graft   05/31/14    1423    Upper arm   1499   Hemodialysis Catheter Right Subclavian Double-lumen   02/10/17    1343    Subclavian   513  Intake/Output Last 24 hours No intake or output data in the 24 hours ending 07/08/18 1556  Labs/Imaging Results for orders placed or performed during the hospital encounter of 07/08/18 (from the past 48 hour(s))  Basic metabolic panel     Status: Abnormal   Collection Time: 07/08/18 12:05 PM  Result Value Ref Range   Sodium 140 135 - 145 mmol/L   Potassium 6.3 (HH) 3.5 - 5.1 mmol/L    Comment: HEMOLYSIS AT THIS LEVEL MAY AFFECT RESULT CRITICAL RESULT CALLED TO, READ BACK BY AND VERIFIED WITH HEATHER FISHER  @1233  ON 07/08/2018 BY FMW    Chloride 106 98 - 111 mmol/L   CO2 20 (L) 22 - 32 mmol/L   Glucose, Bld 97 70 - 99 mg/dL   BUN 82 (H) 6 - 20 mg/dL   Creatinine, Ser  17.19 (H) 0.61 - 1.24 mg/dL   Calcium 8.8 (L) 8.9 - 10.3 mg/dL   GFR calc non Af Amer 3 (L) >60 mL/min   GFR calc Af Amer 3 (L) >60 mL/min   Anion gap 14 5 - 15    Comment: Performed at [facility], [address] [place], [place] [address]  CBC with Differential     Status: Abnormal   Collection Time: 07/08/18 12:05 PM  Result Value Ref Range   WBC 6.9 4.0 - 10.5 K/uL   RBC 3.18 (L) 4.22 - 5.81 MIL/uL   Hemoglobin 10.1 (L) 13.0 - 17.0 g/dL   HCT 31.1 (L) 39.0 - 52.0 %   MCV 97.8 80.0 - 100.0 fL   MCH 31.8 26.0 - 34.0 pg   MCHC 32.5 30.0 - 36.0 g/dL   RDW 15.2 11.5 - 15.5 %   Platelets 188 150 - 400 K/uL   nRBC 0.0 0.0 - 0.2 %   Neutrophils Relative % 78 %   Neutro Abs 5.3 1.7 - 7.7 K/uL   Lymphocytes Relative 12 %   Lymphs Abs 0.9 0.7 - 4.0 K/uL   Monocytes Relative 9 %   Monocytes Absolute 0.6 0.1 - 1.0 K/uL   Eosinophils Relative 1 %   Eosinophils Absolute 0.1 0.0 - 0.5 K/uL   Basophils Relative 0 %   Basophils Absolute 0.0 0.0 - 0.1 K/uL   Immature Granulocytes 0 %   Abs Immature Granulocytes 0.03 0.00 - 0.07 K/uL    Comment: Performed at [facility], [address] [place], [place] [address]   Dg Chest Portable 1 View  Result Date: 07/08/2018 CLINICAL DATA:  Shortness of breath EXAM: PORTABLE CHEST 1 VIEW COMPARISON:  06/10/2018 FINDINGS: Dialysis catheter on the right with tip at the right atrium. There is cardiomegaly and congested appearance of the hila. Diffuse interstitial opacity with a few Kerley lines. No effusion or pneumothorax. IMPRESSION: Cardiomegaly and pulmonary edema. Electronically Signed   By: Monte Fantasia M.D.   On: 07/08/2018 11:55    Pending Labs [employer] (From admission, onward)    Start     Ordered   Signed and Held  CBC  (enoxaparin (LOVENOX)    CrCl < 30 ml/min)  Once,   R    Comments:  Baseline for enoxaparin therapy IF NOT ALREADY DRAWN.  Notify MD if PLT < 100 K.    Signed and Held   Signed and Held   Creatinine, serum  (enoxaparin (LOVENOX)    CrCl < 30 ml/min)  Once,   R    Comments:  Baseline for enoxaparin therapy IF NOT ALREADY DRAWN.  Signed and Held   Signed and Held  Creatinine, serum  (enoxaparin (LOVENOX)    CrCl < 30 ml/min)  Weekly,   R    Comments:  while on enoxaparin therapy.    Signed and Held   Signed and Held  Basic metabolic panel  Tomorrow morning,   R     Signed and Held   Signed and Held  CBC  Tomorrow morning,   R     Signed and Held          Vitals/Pain Today's Vitals   07/08/18 1415 07/08/18 1430 07/08/18 1445 07/08/18 1500  BP: (!) 191/132 (!) 202/132 (!) 199/126 (!) 209/130  Pulse: 77 77 75 78  Resp: 18 18 18 15   Temp:      TempSrc:      SpO2:      Weight:      Height:      PainSc:        Isolation Precautions No active isolations  Medications Medications  Chlorhexidine Gluconate Cloth 2 % PADS 6 each (has no administration in time range)  epoetin alfa (EPOGEN) injection 10,000 Units (has no administration in time range)  calcium gluconate inj 10% (1 g) URGENT USE ONLY! (1 g Intravenous Given 07/08/18 1350)  sodium bicarbonate injection 50 mEq (50 mEq Intravenous Given 07/08/18 1350)    Mobility walks Low fall risk   Focused Assessments Pulmonary Assessment Handoff:  Lung sounds: Bilateral Breath Sounds: Coarse crackles O2 Device: Room Air        R Recommendations: See Admitting Provider Note  Report given to:   Additional Notes: Pt in dialysis, to be admitted after completion

## 2018-07-08 NOTE — Progress Notes (Signed)
Post HD Tx    07/08/18 1730  Hand-Off documentation  Report given to (Full Name) Claretta Fraise, RN   Report received from (Full Name) Beatris Ship, RN   Vital Signs  Temp 97.6 F (36.4 C)  Temp Source Oral  Pulse Rate Source Monitor  BP Location Right Arm  BP Method Automatic  Patient Position (if appropriate) Sitting  Oxygen Therapy  SpO2 100 %  O2 Device Room Air  Pulse Oximetry Type Continuous  Pain Assessment  Pain Scale 0-10  Pain Score 0  Hemodialysis Catheter Right Subclavian Double-lumen  Placement Date/Time: (c) 02/10/17 1343   Placed prior to admission: Yes  Orientation: Right  Access Location: Subclavian  Hemodialysis Catheter Type: Double-lumen  Site Condition No complications  Blue Lumen Status Heparin locked  Red Lumen Status Heparin locked  Post treatment catheter status Capped and Clamped

## 2018-07-08 NOTE — Progress Notes (Signed)
Family Meeting Note  Advance Directive:yes  Today a meeting took place with the Patient.    The following clinical team members were present during this meeting:MD  The following were discussed:Patient's diagnosis: Acute pulmonary edema secondary to missed hemodialysis, hyperkalemia, end-stage renal disease on hemodialysis Monday Wednesday Friday hypertensive urgency, history of COPD, coronary artery disease is getting admitted to the hospital .  Plan of care discussed with the patient patient is getting urgent hemodialysis.  Nephrology consult placed and discussed with him   patient's progosis: Unable to determine and Goals for treatment: DNR  Healthcare power of attorney fianc Kim  Additional follow-up to be provided: Hospitalist and nephrology  Time spent during discussion:17 min  Nicholes Mango, MD

## 2018-07-08 NOTE — ED Triage Notes (Signed)
Pt to ED via POV c/o shortness of breath. Pt states that he missed his dialysis treatment on Wednesday. Pt states that shortness of breath started this morning. Pt is in NAD.
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
## 2018-07-08 NOTE — Progress Notes (Signed)
Pre HD Assessment    07/08/18 1400  Neurological  Level of Consciousness Alert  Orientation Level Oriented X4  Respiratory  Respiratory Pattern Regular  Chest Assessment Chest expansion symmetrical  Bilateral Breath Sounds Coarse crackles  Cardiac  Pulse Regular  Heart Sounds S1, S2  Jugular Venous Distention (JVD) No  ECG Monitor No (pt refused monitor)  Vascular  R Radial Pulse +2  L Radial Pulse +2  Edema Generalized  Integumentary  Integumentary (WDL) X  Skin Color Appropriate for ethnicity  Skin Condition Dry  Musculoskeletal  Musculoskeletal (WDL) WDL  Gastrointestinal  Bowel Sounds Assessment Active  GU Assessment  Genitourinary (WDL) X  Genitourinary Symptoms Other (Comment) (Hd pt)  Psychosocial  Psychosocial (WDL) WDL

## 2018-07-08 NOTE — Progress Notes (Signed)
HD Tx started w/o complication    45/40/98 1410  Vital Signs  Pulse Rate 75  Pulse Rate Source Monitor  Resp 19  BP (!) 195/122  BP Location Right Arm  BP Method Automatic  Patient Position (if appropriate) Sitting  Oxygen Therapy  SpO2 100 %  O2 Device Room Air  During Hemodialysis Assessment  Blood Flow Rate (mL/min) 400 mL/min  Arterial Pressure (mmHg) -170 mmHg  Venous Pressure (mmHg) 150 mmHg  Transmembrane Pressure (mmHg) 60 mmHg  Ultrafiltration Rate (mL/min) 1000 mL/min  Dialysate Flow Rate (mL/min) 600 ml/min  Conductivity: Machine  13.7  HD Safety Checks Performed Yes  Dialysis Fluid Bolus Normal Saline  Bolus Amount (mL) 250 mL  Intra-Hemodialysis Comments Tx initiated

## 2018-07-08 NOTE — ED Notes (Signed)
ED TO INPATIENT HANDOFF REPORT  ED Nurse Name and Phone #: [phone]  S Name/Age/Gender Fernando Salas 48 y.o. male Room/Bed: HD03AR/HD03AR  Code Status   Code Status: Prior  Home/SNF/Other Home Patient oriented to: self, place, time and situation Is this baseline? Yes   Triage Complete: Triage complete  Chief Complaint Hyperkalemia [E87.5] Acute pulmonary edema ([place]) [J81.0] End-stage renal disease on hemodialysis ([place]) [N18.6, Z99.2]  Triage Note Pt to ED via POV c/o shortness of breath. Pt states that he missed his dialysis treatment on Wednesday. Pt states that shortness of breath started this morning. Pt is in NAD.    Allergies Allergies  Allergen Reactions  . Ibuprofen Other (See Comments)    Kidney   . Viagra [Sildenafil]     Lips swell   . Clonidine Derivatives Rash    Level of Care/Admitting Diagnosis ED Disposition    ED Disposition Condition Comment   Admit  The patient appears reasonably stabilized for admission considering the current resources, flow, and capabilities available in the ED at this time, and I doubt any other [facility] requiring further screening and/or treatment in the ED prior to admission is  present.       B Medical/Surgery History Past Medical History:  Diagnosis Date  . CAD (coronary artery disease)    status post multiple myocardial infarctions  . COPD (chronic obstructive pulmonary disease) ([place])   . ESRD (end stage renal disease) ([place])   . Hyperlipidemia   . Hypertension   . Myocardial infarction ([place])    " I had 13 heart attacks, 8 massive"  . Proteinuria   . Renal insufficiency   . Secondary hyperparathyroidism of renal origin ([place])   . Shortness of breath dyspnea    " when I drink a cup of cold water too fast"  . Sleep apnea    pt stated " long story" does not wear CPAP  . Stroke [facility])    Past Surgical History:  Procedure Laterality Date  . AV FISTULA PLACEMENT Left 01-24-14   By Dr. Hortencia Pilar in [place]  .  AV FISTULA PLACEMENT Right 04/30/2014   Procedure: RIGHT BRACHIOCEPHALIC ARTERIOVENOUS (AV) FISTULA CREATION;  Surgeon: Elam Dutch, MD;  Location: [facility] OR;  Service: Vascular;  Laterality: Right;  . AV FISTULA PLACEMENT Left 05/31/2014   Procedure: Left INSERTION OF ARTERIOVENOUS (AV) GORE-TEX GRAFT ARM;  Surgeon: Elam Dutch, MD;  Location: [place];  Service: Vascular;  Laterality: Left;  . CARDIAC CATHETERIZATION    . CORONARY STENT PLACEMENT     13 stents total  . DIALYSIS/PERMA CATHETER INSERTION N/A 01/15/2017   Procedure: DIALYSIS/PERMA CATHETER INSERTION;  Surgeon: Katha Cabal, MD;  Location: [place] CV LAB;  Service: Cardiovascular;  Laterality: N/A;     A IV Location/Drains/Wounds Patient Lines/Drains/Airways Status   Active Line/Drains/Airways    Name:   Placement date:   Placement time:   Site:   Days:   Peripheral IV 07/08/18 Left Wrist   07/08/18    1206    Wrist   less than 1   Fistula / Graft Right Upper arm Arteriovenous fistula   04/30/14    0826    Upper arm   1530   Fistula / Graft Left Upper arm Arteriovenous vein graft   05/31/14    1423    Upper arm   1499   Hemodialysis Catheter Right Subclavian Double-lumen   02/10/17    1343    Subclavian   513  Intake/Output Last 24 hours No intake or output data in the 24 hours ending 07/08/18 1504  Labs/Imaging Results for orders placed or performed during the hospital encounter of 07/08/18 (from the past 48 hour(s))  Basic metabolic panel     Status: Abnormal   Collection Time: 07/08/18 12:05 PM  Result Value Ref Range   Sodium 140 135 - 145 mmol/L   Potassium 6.3 (HH) 3.5 - 5.1 mmol/L    Comment: HEMOLYSIS AT THIS LEVEL MAY AFFECT RESULT CRITICAL RESULT CALLED TO, READ BACK BY AND VERIFIED WITH HEATHER FISHER  @1233  ON 07/08/2018 BY FMW    Chloride 106 98 - 111 mmol/L   CO2 20 (L) 22 - 32 mmol/L   Glucose, Bld 97 70 - 99 mg/dL   BUN 82 (H) 6 - 20 mg/dL   Creatinine, Ser 17.19 (H) 0.61  - 1.24 mg/dL   Calcium 8.8 (L) 8.9 - 10.3 mg/dL   GFR calc non Af Amer 3 (L) >60 mL/min   GFR calc Af Amer 3 (L) >60 mL/min   Anion gap 14 5 - 15    Comment: Performed at [facility], [address] [place], [place] [address]  CBC with Differential     Status: Abnormal   Collection Time: 07/08/18 12:05 PM  Result Value Ref Range   WBC 6.9 4.0 - 10.5 K/uL   RBC 3.18 (L) 4.22 - 5.81 MIL/uL   Hemoglobin 10.1 (L) 13.0 - 17.0 g/dL   HCT 31.1 (L) 39.0 - 52.0 %   MCV 97.8 80.0 - 100.0 fL   MCH 31.8 26.0 - 34.0 pg   MCHC 32.5 30.0 - 36.0 g/dL   RDW 15.2 11.5 - 15.5 %   Platelets 188 150 - 400 K/uL   nRBC 0.0 0.0 - 0.2 %   Neutrophils Relative % 78 %   Neutro Abs 5.3 1.7 - 7.7 K/uL   Lymphocytes Relative 12 %   Lymphs Abs 0.9 0.7 - 4.0 K/uL   Monocytes Relative 9 %   Monocytes Absolute 0.6 0.1 - 1.0 K/uL   Eosinophils Relative 1 %   Eosinophils Absolute 0.1 0.0 - 0.5 K/uL   Basophils Relative 0 %   Basophils Absolute 0.0 0.0 - 0.1 K/uL   Immature Granulocytes 0 %   Abs Immature Granulocytes 0.03 0.00 - 0.07 K/uL    Comment: Performed at [facility], [address] [place], [place] [address]   Dg Chest Portable 1 View  Result Date: 07/08/2018 CLINICAL DATA:  Shortness of breath EXAM: PORTABLE CHEST 1 VIEW COMPARISON:  06/10/2018 FINDINGS: Dialysis catheter on the right with tip at the right atrium. There is cardiomegaly and congested appearance of the hila. Diffuse interstitial opacity with a few Kerley lines. No effusion or pneumothorax. IMPRESSION: Cardiomegaly and pulmonary edema. Electronically Signed   By: Monte Fantasia M.D.   On: 07/08/2018 11:55    Pending Labs [employer] (From admission, onward)    Start     Ordered   Signed and Held  CBC  (enoxaparin (LOVENOX)    CrCl < 30 ml/min)  Once,   R    Comments:  Baseline for enoxaparin therapy IF NOT ALREADY DRAWN.  Notify MD if PLT < 100 K.    Signed and Held   Signed and Held  Creatinine, serum   (enoxaparin (LOVENOX)    CrCl < 30 ml/min)  Once,   R    Comments:  Baseline for enoxaparin therapy IF NOT ALREADY DRAWN.  Signed and Held   Signed and Held  Creatinine, serum  (enoxaparin (LOVENOX)    CrCl < 30 ml/min)  Weekly,   R    Comments:  while on enoxaparin therapy.    Signed and Held   Signed and Held  Basic metabolic panel  Tomorrow morning,   R     Signed and Held   Signed and Held  CBC  Tomorrow morning,   R     Signed and Held          Vitals/Pain Today's Vitals   07/08/18 1410 07/08/18 1415 07/08/18 1430 07/08/18 1445  BP: (!) 195/122 (!) 191/132 (!) 202/132 (!) 199/126  Pulse: 75 77 77 75  Resp: 19 18 18 18   Temp:      TempSrc:      SpO2: 100%     Weight:      Height:      PainSc:        Isolation Precautions No active isolations  Medications Medications  Chlorhexidine Gluconate Cloth 2 % PADS 6 each (has no administration in time range)  epoetin alfa (EPOGEN) injection 10,000 Units (has no administration in time range)  calcium gluconate inj 10% (1 g) URGENT USE ONLY! (1 g Intravenous Given 07/08/18 1350)  sodium bicarbonate injection 50 mEq (50 mEq Intravenous Given 07/08/18 1350)    Mobility walks Low fall risk   Focused Assessments Pulmonary Assessment Handoff:  Lung sounds: Bilateral Breath Sounds: Coarse crackles O2 Device: Room Air        R Recommendations: See Admitting Provider Note  Report given to:   Additional Notes: PT in Dialysis, to be admitted from there

## 2018-07-08 NOTE — Progress Notes (Signed)
Pre HD Tx   07/08/18 1408  Hand-Off documentation  Report given to (Full Name) Beatris Ship, RN   Report received from (Full Name) Hughes Better, RN   Vital Signs  Temp 97.6 F (36.4 C)  Temp Source Oral  Pulse Rate 77  Pulse Rate Source Monitor  Resp 20  BP (!) 202/132  BP Location Right Arm  BP Method Automatic  Patient Position (if appropriate) Sitting  Oxygen Therapy  SpO2 100 %  O2 Device Room Air  Pain Assessment  Pain Scale 0-10  Pain Score 0  Dialysis Weight  Weight 98.3 kg  Type of Weight Pre-Dialysis  Time-Out for Hemodialysis  What Procedure? HD  Pt Identifiers(min of two) First/Last Name;MRN/Account#  Correct Site? Yes  Correct Side? Yes  Correct Procedure? Yes  Consents Verified? Yes  Rad Studies Available? N/A  Engineer, civil (consulting) Number 3  Station Number 3  UF/Alarm Test Passed  Conductivity: Meter 14  Conductivity: Machine  14  pH 7.2  Reverse Osmosis Main  Normal Saline Lot Number [id number]  Dialyzer Lot Number [id number]  Disposable Set Lot Number [id number]  Machine Temperature 98.6 F (37 C)  Musician and Audible Yes  Blood Lines Intact and Secured Yes  Pre Treatment Patient Checks  Vascular access used during treatment Catheter  Patient is receiving dialysis in a chair Yes  Hepatitis B Surface Antigen Results Negative  Date Hepatitis B Surface Antigen Drawn 06/27/18  Hepatitis B Surface Antibody  (<10)  Date Hepatitis B Surface Antibody Drawn 06/27/18  Hemodialysis Consent Verified Yes  Hemodialysis Standing Orders Initiated Yes  ECG (Telemetry) Monitor On Yes  Prime Ordered Normal Saline  Length of  DialysisTreatment -hour(s) 3.5 Hour(s)  Dialysis Treatment Comments Na 140  Dialyzer Elisio 17H NR  Dialysate 2K, 2.5 Ca  Dialysis Anticoagulant None  Dialysate Flow Ordered 600  Blood Flow Rate Ordered 400 mL/min  Ultrafiltration Goal 2.5 Liters  Dialysis Blood Pressure Support Ordered Normal Saline  Education / Care Plan   Dialysis Education Provided Yes  Documented Education in Care Plan Yes  Hemodialysis Catheter Right Subclavian Double-lumen  Placement Date/Time: (c) 02/10/17 1343   Placed prior to admission: Yes  Orientation: Right  Access Location: Subclavian  Hemodialysis Catheter Type: Double-lumen  Site Condition No complications  Blue Lumen Status Blood return noted  Red Lumen Status Blood return noted  Purple Lumen Status N/A  Dressing Type Biopatch;Occlusive  Dressing Status Clean;Dry;Intact

## 2018-07-08 NOTE — ED Provider Notes (Signed)
[facility] Emergency Department Provider Note  ____________________________________________  Time seen: Approximately 1:17 PM  I have reviewed the triage vital signs and the nursing notes.   HISTORY  Chief Complaint Shortness of Breath    HPI Fernando Salas is a 48 y.o. male with a history of CAD COPD end-stage renal disease on hemodialysis and hypertension who comes to the ED complaining of shortness of breath and generalized weakness, worsening for the past 2 days, severe.  Worse with exertion, no alleviating factors.  Also complains of leg swelling.  Reports that he normally gets dialysis Monday Wednesday Friday at [facility] but has not been there for the past 4 days.      Past Medical History:  Diagnosis Date  . CAD (coronary artery disease)    status post multiple myocardial infarctions  . COPD (chronic obstructive pulmonary disease) ([place])   . ESRD (end stage renal disease) ([place])   . Hyperlipidemia   . Hypertension   . Myocardial infarction ([place])    " I had 13 heart attacks, 8 massive"  . Proteinuria   . Renal insufficiency   . Secondary hyperparathyroidism of renal origin ([place])   . Shortness of breath dyspnea    " when I drink a cup of cold water too fast"  . Sleep apnea    pt stated " long story" does not wear CPAP  . Stroke [facility])      Patient Active Problem List   Diagnosis Date Noted  . ESRD (end stage renal disease) on dialysis ([place]) 06/27/2018  . Hypertensive urgency, malignant 04/08/2018  . Hypertensive urgency 03/25/2018  . Pulmonary edema 03/09/2017  . 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 Fluid overload 02/08/2017  . Accelerated hypertension 01/12/2017  . AKI (acute kidney injury) ([place])   . Unstable angina ([place]) 11/01/2016     Past Surgical History:  Procedure Laterality Date  . AV FISTULA PLACEMENT Left 01-24-14   By Dr. Hortencia Pilar in [place]  . AV FISTULA PLACEMENT Right 04/30/2014   Procedure: RIGHT  BRACHIOCEPHALIC ARTERIOVENOUS (AV) FISTULA CREATION;  Surgeon: Elam Dutch, MD;  Location: [facility] OR;  Service: Vascular;  Laterality: Right;  . AV FISTULA PLACEMENT Left 05/31/2014   Procedure: Left INSERTION OF ARTERIOVENOUS (AV) GORE-TEX GRAFT ARM;  Surgeon: Elam Dutch, MD;  Location: [place];  Service: Vascular;  Laterality: Left;  . CARDIAC CATHETERIZATION    . CORONARY STENT PLACEMENT     13 stents total  . DIALYSIS/PERMA CATHETER INSERTION N/A 01/15/2017   Procedure: DIALYSIS/PERMA CATHETER INSERTION;  Surgeon: Katha Cabal, MD;  Location: [place] CV LAB;  Service: Cardiovascular;  Laterality: N/A;     Prior to Admission medications   Medication Sig Start Date End Date Taking? Authorizing Provider  amLODipine (NORVASC) 10 MG tablet Take 1 tablet (10 mg total) by mouth daily. 05/01/17 07/01/18  Lisa Roca, MD  atorvastatin (LIPITOR) 40 MG tablet Take 1 tablet (40 mg total) by mouth daily. 11/03/16   Demetrios Loll, MD  calcium acetate (PHOSLO) 667 MG capsule Take 1,334 mg by mouth 3 (three) times daily with meals. 12/03/17 12/03/18  [provider]  carvedilol (COREG) 25 MG tablet Take 1 tablet (25 mg total) by mouth 2 (two) times daily. 05/01/17 07/01/18  Lisa Roca, MD  clopidogrel (PLAVIX) 75 MG tablet Take 1 tablet (75 mg total) by mouth daily. 11/03/16   Demetrios Loll, MD  furosemide (LASIX) 80 MG tablet Take 80 mg by mouth See admin instructions. Take  1 tablet (80mg ) every morning on non-dialysis days 05/16/18   [provider]  hydrALAZINE (APRESOLINE) 100 MG tablet Take 1 tablet (100 mg total) by mouth 4 (four) times daily. Patient taking differently: Take 100 mg by mouth every 8 (eight) hours.  05/01/17 07/01/18  Lisa Roca, MD  isosorbide dinitrate (ISORDIL) 30 MG tablet Take 30 mg by mouth 3 (three) times daily.     [provider]  Multiple Vitamin (MULTIVITAMIN) tablet Take 1 tablet by mouth daily. 04/10/18   Henreitta Leber, MD      Allergies Ibuprofen; Viagra [sildenafil]; and Clonidine derivatives   Family History  Problem Relation Age of Onset  . Heart disease Mother   . Hyperlipidemia Mother   . Hypertension Mother   . Kidney disease Father   . Kidney disease Brother   . Hyperlipidemia Sister   . Hyperlipidemia Daughter   . Hypertension Sister   . Hypertension Daughter     Social History Social History   Tobacco Use  . Smoking status: Current Every Day Smoker    Packs/day: 0.25    Years: 25.00    Pack years: 6.25    Types: Cigarettes  . Smokeless tobacco: Never Used  Substance Use Topics  . Alcohol use: No    Alcohol/week: 0.0 standard drinks    Frequency: Never    Comment: occasional  . Drug use: Not Currently    Types: Marijuana    Comment: daily    Review of Systems  Constitutional:   No fever or chills.  ENT:   No sore throat. No rhinorrhea. Cardiovascular:   No chest pain or syncope. Respiratory:   Positive shortness of breath without cough. Gastrointestinal:   Negative for abdominal pain, vomiting and diarrhea.  Musculoskeletal: Positive leg swelling All other systems reviewed and are negative except as documented above in ROS and HPI.  ____________________________________________   PHYSICAL EXAM:  VITAL SIGNS: ED Triage Vitals  Enc Vitals Group     BP 07/08/18 1121 (!) 186/112     Pulse Rate 07/08/18 1121 77     Resp 07/08/18 1121 18     Temp 07/08/18 1121 97.6 F (36.4 C)     Temp Source 07/08/18 1121 Oral     SpO2 07/08/18 1121 96 %     Weight 07/08/18 1116 208 lb 1.8 oz (94.4 kg)     Height 07/08/18 1116 6' (1.829 m)     Head Circumference --      Peak Flow --      Pain Score 07/08/18 1116 0     Pain Loc --      Pain Edu? --      Excl. in [place]? --     Vital signs reviewed, nursing assessments reviewed.   Constitutional:   Alert and oriented. Non-toxic appearance. Eyes:   Conjunctivae are normal. EOMI. PERRL. ENT      Head:   Normocephalic and  atraumatic.      Nose:   No congestion/rhinnorhea.       Mouth/Throat:   MMM, no pharyngeal erythema. No peritonsillar mass.       Neck:   No meningismus. Full ROM. Hematological/Lymphatic/Immunilogical:   No cervical lymphadenopathy. Cardiovascular:   RRR. Symmetric bilateral radial and DP pulses.  No murmurs. Cap refill less than 2 seconds. Respiratory:   Normal respiratory effort without tachypnea/retractions.  Basilar crackles bilaterally. Gastrointestinal:   Soft and nontender. Non distended. There is no CVA tenderness.  No rebound, rigidity, or guarding.  Musculoskeletal:   Normal range of motion in all extremities. No joint effusions.  No lower extremity tenderness.  1+ pitting edema bilateral lower extremities. Neurologic:   Normal speech and language.  Motor grossly intact. No acute focal neurologic deficits are appreciated.  Skin:    Skin is warm, dry and intact. No rash noted.  No petechiae, purpura, or bullae.  ____________________________________________    LABS (pertinent positives/negatives) (all labs ordered are listed, but only abnormal results are displayed) Labs Reviewed  BASIC METABOLIC PANEL - Abnormal; Notable for the following components:      Result Value   Potassium 6.3 (*)    CO2 20 (*)    BUN 82 (*)    Creatinine, Ser 17.19 (*)    Calcium 8.8 (*)    GFR calc non Af Amer 3 (*)    GFR calc Af Amer 3 (*)    All other components within normal limits  CBC WITH DIFFERENTIAL/PLATELET - Abnormal; Notable for the following components:   RBC 3.18 (*)    Hemoglobin 10.1 (*)    HCT 31.1 (*)    All other components within normal limits   ____________________________________________   EKG  Interpreted by me Normal sinus rhythm rate of 76, right axis, normal intervals.  Poor R wave progression.  Normal ST segments.  Isolated T wave inversions in V6 which are nonspecific.  ____________________________________________    RADIOLOGY  Dg Chest Portable 1  View  Result Date: 07/08/2018 CLINICAL DATA:  Shortness of breath EXAM: PORTABLE CHEST 1 VIEW COMPARISON:  06/10/2018 FINDINGS: Dialysis catheter on the right with tip at the right atrium. There is cardiomegaly and congested appearance of the hila. Diffuse interstitial opacity with a few Kerley lines. No effusion or pneumothorax. IMPRESSION: Cardiomegaly and pulmonary edema. Electronically Signed   By: Monte Fantasia M.D.   On: 07/08/2018 11:55    ____________________________________________   PROCEDURES .Critical Care Performed by: Carrie Mew, MD Authorized by: Carrie Mew, MD   Critical care provider statement:    Critical care time (minutes):  32   Critical care time was exclusive of:  Separately billable procedures and treating other patients   Critical care was necessary to treat or prevent imminent or life-threatening deterioration of the following conditions:  Metabolic crisis and cardiac failure   Critical care was time spent personally by me on the following activities:  Development of treatment plan with patient or surrogate, discussions with consultants, evaluation of patient's response to treatment, examination of patient, obtaining history from patient or surrogate, ordering and performing treatments and interventions, ordering and review of laboratory studies, ordering and review of radiographic studies, pulse oximetry, re-evaluation of patient's condition and review of old charts    ____________________________________________  DIFFERENTIAL DIAGNOSIS   Hyperkalemia, pulmonary edema, pleural effusion, pneumonia, severe uremia, volume overload  CLINICAL IMPRESSION / ASSESSMENT AND PLAN / ED COURSE  Medications ordered in the ED: Medications  calcium gluconate inj 10% (1 g) URGENT USE ONLY! (has no administration in time range)  sodium bicarbonate injection 50 mEq (has no administration in time range)    Pertinent labs & imaging results that were available  during my care of the patient were reviewed by me and considered in my medical decision making (see chart for details).  Fernando Salas was evaluated in Emergency Department on 07/08/2018 for the symptoms described in the history of present illness. He was evaluated in the context of the global COVID-19 pandemic, which necessitated consideration that the patient might be  at risk for infection with the SARS-CoV-2 virus that causes COVID-19. Institutional protocols and algorithms that pertain to the evaluation of patients at risk for COVID-19 are in a state of rapid change based on information released by regulatory bodies including the CDC and federal and state organizations. These policies and algorithms were followed during the patient's care in the ED.   Patient presents after missed dialysis, last dialysis was 4 days ago.  Concerning symptoms, labs show a potassium of 6.3, BUN of 80.  Chest x-ray shows pulmonary edema and cephalization.  With exam this is consistent with volume overload, heart failure and metabolic derangement.  Discussed with Dr. Juleen China of nephrology who will arrange for dialysis today.  Will discuss with hospitalist as well for further management.      ____________________________________________   FINAL CLINICAL IMPRESSION(S) / ED DIAGNOSES    Final diagnoses:  End-stage renal disease on hemodialysis ([place])  Hyperkalemia  Acute pulmonary edema [facility])     ED Discharge Orders    None      Portions of this note were generated with dragon dictation software. Dictation errors may occur despite best attempts at proofreading.   Carrie Mew, MD 07/08/18 1322

## 2018-07-08 NOTE — Progress Notes (Signed)
HD Tx completed,t olerated well, UF goal met.    07/08/18 1715  Vital Signs  Pulse Rate 73  Resp (!) 24  BP (!) 203/118  BP Location Right Arm  BP Method Automatic  Patient Position (if appropriate) Sitting  Oxygen Therapy  SpO2 100 %  O2 Device Room Air  Pulse Oximetry Type Continuous  Pain Assessment  Pain Scale 0-10  Pain Score 0  Dialysis Weight  Weight 96.3 kg  Type of Weight Post-Dialysis  During Hemodialysis Assessment  HD Safety Checks Performed Yes  KECN 69.3 KECN  Dialysis Fluid Bolus Normal Saline  Bolus Amount (mL) 250 mL  Intra-Hemodialysis Comments Tx completed;Tolerated well  Hemodialysis Catheter Right Subclavian Double-lumen  Placement Date/Time: (c) 02/10/17 1343   Placed prior to admission: Yes  Orientation: Right  Access Location: Subclavian  Hemodialysis Catheter Type: Double-lumen  Site Condition No complications

## 2018-07-08 NOTE — H&P (Signed)
[facility] [place] NAME: Fernando Salas    MR#:  [id number]  DATE OF BIRTH:  1970/06/27  DATE OF ADMISSION:  07/08/2018  PRIMARY CARE PHYSICIAN: Patient, No Pcp Per   REQUESTING/REFERRING PHYSICIAN: Carrie Mew MD  CHIEF COMPLAINT:   Chief Complaint  Patient presents with   Shortness of Breath    HISTORY OF PRESENT ILLNESS:  Fernando Salas  is a 48 y.o. male with a known history of uncontrolled hypertension due to medication noncompliance, multiple MIs with PCI and stents, CAD, COPD, secondary hyperparathyroidism, CHF(EF45% with grade II diastolic dysfunction per 09/3417 echo), CVA without residual deficit, and ESRD on hemodialysis (Monday, Wednesday, and Friday) presenting to the ED with chief complaints of worsening shortness of breath.  Of note he was discharged late 2019 from [place] dialysis center for making threats to the dialysis staff.  Had been discharged from [place] [facility] earlier in 2019 for similar behavioral issues.  At this point, all [facility] and [facility] dialysis centers have declined him admission so usually goes to a local ED for his dialysis.  Patient reports that he was supposed to go to [facility] for his dialysis treatment on Wednesday but missed the appointment.  In the ED today, he was afebrile with blood pressure of 186/112, heart rate 77, satting 96% on room air.  His initial labs revealed elevated potassium of 6.3, decreased kidney functions at baseline with unremarkable CBC.  Chest x-ray was obtained and showed cardiomegaly and pulmonary edema.  Nephrology was contacted by ED physician who recommended patient be admitted for dialysis treatment and management of pulmonary edema and hyperkalemia.  PAST MEDICAL HISTORY:   Past Medical History:  Diagnosis Date   CAD (coronary artery disease)    status post multiple myocardial infarctions   COPD (chronic obstructive pulmonary disease) (HCC)    ESRD (end stage renal  disease) ([place])    Hyperlipidemia    Hypertension    Myocardial infarction ([place])    " I had 13 heart attacks, 8 massive"   Proteinuria    Renal insufficiency    Secondary hyperparathyroidism of renal origin ([place])    Shortness of breath dyspnea    " when I drink a cup of cold water too fast"   Sleep apnea    pt stated " long story" does not wear CPAP   Stroke [facility])     PAST SURGICAL HISTORY:   Past Surgical History:  Procedure Laterality Date   AV FISTULA PLACEMENT Left 01-24-14   By Dr. Hortencia Pilar in [place] Right 04/30/2014   Procedure: RIGHT BRACHIOCEPHALIC ARTERIOVENOUS (AV) FISTULA CREATION;  Surgeon: Elam Dutch, MD;  Location: [facility];  Service: Vascular;  Laterality: Right;   AV FISTULA PLACEMENT Left 05/31/2014   Procedure: Left INSERTION OF ARTERIOVENOUS (AV) GORE-TEX GRAFT ARM;  Surgeon: Elam Dutch, MD;  Location: [facility];  Service: Vascular;  Laterality: Left;   CARDIAC CATHETERIZATION     CORONARY STENT PLACEMENT     13 stents total   DIALYSIS/PERMA CATHETER INSERTION N/A 01/15/2017   Procedure: DIALYSIS/PERMA CATHETER INSERTION;  Surgeon: Katha Cabal, MD;  Location: [facility];  Service: Cardiovascular;  Laterality: N/A;    SOCIAL HISTORY:   Social History   Tobacco Use   Smoking status: Current Every Day Smoker    Packs/day: 0.25    Years: 25.00    Pack years: 6.25    Types: Cigarettes  Smokeless tobacco: Never Used  Substance Use Topics   Alcohol use: No    Alcohol/week: 0.0 standard drinks    Frequency: Never    Comment: occasional    FAMILY HISTORY:   Family History  Problem Relation Age of Onset   Heart disease Mother    Hyperlipidemia Mother    Hypertension Mother    Kidney disease Father    Kidney disease Brother    Hyperlipidemia Sister    Hyperlipidemia Daughter    Hypertension Sister    Hypertension Daughter     DRUG ALLERGIES:   Allergies    Allergen Reactions   Ibuprofen Other (See Comments)    Kidney    Viagra [Sildenafil]     Lips swell    Clonidine Derivatives Rash    REVIEW OF SYSTEMS:   Review of Systems  Constitutional: Negative for chills, fever, malaise/fatigue and weight loss.  HENT: Negative for congestion, hearing loss and sore throat.   Eyes: Negative for blurred vision and double vision.  Respiratory: Positive for shortness of breath and wheezing. Negative for cough.   Cardiovascular: Negative for chest pain, palpitations, orthopnea and leg swelling.  Gastrointestinal: Positive for abdominal pain. Negative for diarrhea, nausea and vomiting.  Genitourinary: Negative for dysuria and urgency.  Musculoskeletal: Negative for myalgias.  Skin: Negative for rash.  Neurological: Negative for dizziness, sensory change, speech change, focal weakness and headaches.  Psychiatric/Behavioral: Negative for depression.   MEDICATIONS AT HOME:   Prior to Admission medications   Medication Sig Start Date End Date Taking? Authorizing Provider  amLODipine (NORVASC) 10 MG tablet Take 1 tablet (10 mg total) by mouth daily. 05/01/17 07/01/18  Lisa Roca, MD  atorvastatin (LIPITOR) 40 MG tablet Take 1 tablet (40 mg total) by mouth daily. 11/03/16   Demetrios Loll, MD  calcium acetate (PHOSLO) 667 MG capsule Take 1,334 mg by mouth 3 (three) times daily with meals. 12/03/17 12/03/18  [provider]  carvedilol (COREG) 25 MG tablet Take 1 tablet (25 mg total) by mouth 2 (two) times daily. 05/01/17 07/01/18  Lisa Roca, MD  clopidogrel (PLAVIX) 75 MG tablet Take 1 tablet (75 mg total) by mouth daily. 11/03/16   Demetrios Loll, MD  furosemide (LASIX) 80 MG tablet Take 80 mg by mouth See admin instructions. Take 1 tablet (80mg ) every morning on non-dialysis days 05/16/18   [provider]  hydrALAZINE (APRESOLINE) 100 MG tablet Take 1 tablet (100 mg total) by mouth 4 (four) times daily. Patient taking differently: Take 100 mg by  mouth every 8 (eight) hours.  05/01/17 07/01/18  Lisa Roca, MD  isosorbide dinitrate (ISORDIL) 30 MG tablet Take 30 mg by mouth 3 (three) times daily.     [provider]  Multiple Vitamin (MULTIVITAMIN) tablet Take 1 tablet by mouth daily. 04/10/18   Henreitta Leber, MD      VITAL SIGNS:  Blood pressure (!) 186/112, pulse 77, temperature 97.6 F (36.4 C), temperature source Oral, resp. rate 18, height 6' (1.829 m), weight 94.4 kg, SpO2 96 %.  PHYSICAL EXAMINATION:   Physical Exam  GENERAL:  48 y.o.-year-old patient lying in the bed with no acute distress.  EYES: Pupils equal, round, reactive to light and accommodation. No scleral icterus. Extraocular muscles intact.  HEENT: Head atraumatic, normocephalic. Oropharynx and nasopharynx clear.  NECK:  Supple, no jugular venous distention. No thyroid enlargement, no tenderness.  LUNGS: Normal breath sounds bilaterally, no wheezing, rales,rhonchi or crepitation. No use of accessory muscles of respiration.  CARDIOVASCULAR:  S1, S2 normal. No murmurs, rubs, or gallops.  ABDOMEN: Soft, nontender, nondistended. Bowel sounds present. No organomegaly or mass.  EXTREMITIES: No pedal edema, cyanosis, or clubbing.  NEUROLOGIC: Mental Status:Alert, oriented, thought content appropriate.  Speech fluent without evidence of aphasia.  Able to follow 3 step commands without difficulty. Attention span and concentration seemed appropriate  Cranial Nerves: II: Discs flat bilaterally; Visual fields grossly normal, pupils equal, round, reactive to light and accommodation III,IV, VI: ptosis not present, extra-ocular motions intact bilaterally V,VII: smile symmetric, facial light touch sensation intact VIII: hearing normal bilaterally IX,X: gag reflex present XI: bilateral shoulder shrug XII: midline tongue extension Muscle strength 5/5 in all extremities.  Tone and bulk:normal tone throughout; no atrophy noted Sensory: Pinprick and light touch intact  bilaterally Deep Tendon Reflexes: 2+ and symmetric throughout Cerebellar:Absent ataxia Gait: not tested due to safety concerns PSYCHIATRIC: The patient is alert and oriented x 3.  SKIN: No obvious rash, lesion, or ulcer.   DATA REVIEWED:  LABORATORY PANEL:   CBC Recent Labs  Lab 07/08/18 1205  WBC 6.9  HGB 10.1*  HCT 31.1*  PLT 188   ------------------------------------------------------------------------------------------------------------------  Chemistries  Recent Labs  Lab 07/08/18 1205  NA 140  K 6.3*  CL 106  CO2 20*  GLUCOSE 97  BUN 82*  CREATININE 17.19*  CALCIUM 8.8*   ------------------------------------------------------------------------------------------------------------------  Cardiac Enzymes No results for input(s): TROPONINI in the last 168 hours. ------------------------------------------------------------------------------------------------------------------  RADIOLOGY:  Dg Chest Portable 1 View  Result Date: 07/08/2018 CLINICAL DATA:  Shortness of breath EXAM: PORTABLE CHEST 1 VIEW COMPARISON:  06/10/2018 FINDINGS: Dialysis catheter on the right with tip at the right atrium. There is cardiomegaly and congested appearance of the hila. Diffuse interstitial opacity with a few Kerley lines. No effusion or pneumothorax. IMPRESSION: Cardiomegaly and pulmonary edema. Electronically Signed   By: Monte Fantasia M.D.   On: 07/08/2018 11:55    EKG:  EKG: normal EKG, normal sinus rhythm, normal sinus rhythm, RBBB, right axis deviation. Vent. rate 76 BPM PR interval 196 ms QRS duration 100 ms QT/QTc 422/474 ms P-R-T axes 36 161 2  IMPRESSION AND PLAN:   48 y.o. male with a known history of uncontrolled hypertension due to medication noncompliance, multiple MIs with PCI and stents, CAD, COPD, secondary hyperparathyroidism, CHF(EF45% with grade II diastolic dysfunction per 03/9145 echo), CVA without residual deficit, and ESRD on hemodialysis (MWF)  presenting with worsening shortness of breath.  1. Pulmonary edema - suspect that his shortness of breath are related to fluid overload/pulmonary edema due to missing his dialysis session on Wednesday - Admit to MedSurg floor - XR reviewed reviewed and shows pulmonary edema and cardiomegaly. Patient is currently getting dialysis. - Nephrology consult placed and following - Continue diuretics - Follow labs in a.m. - Fluid restriction - Monitor I&O strictly  2. ESRD:On Hemodialysis (MWF) although missed dialysis on Wednesday - Nephrology consulted as above, patient is currently getting dialysis - Avoid nephrotoxic drugs; dose all meds for creatinine clearance <10 ml/min   3. Hypertension: BP186/112, HR71before dialysis today.Blood pressure well controlled when he consistently takes his BP medications - Continue home BP meds  4.  Hyperkalemia -patient is currently getting dialysis.  He received calcium gluconate and sodium bicarb in the ED - Will recheck labs  5. Anemia of CKD:Hgb stable at10.1.  Receiving Epogento 8000 unitswith each treatment.Iron saturation27%, ferritin183, 05/27/2018.   6.  DVT prophylaxis -Lovenox subcu   All the records are reviewed and case discussed with ED  provider. Management plans discussed with the patient, family and they are in agreement.  CODE STATUS: DNR  TOTAL TIME TAKING CARE OF THIS PATIENT: 50 minutes.    on 07/08/2018 at 2:14 PM  This patient was staffed with Dr. Margaretmary Eddy, Illene Silver who personally evaluated patient, reviewed documentation and agreed with assessment and plan of care as above.  Rufina Falco, DNP, FNP-BC [facility] Nurse Practitioner Between 7am to 6pm - Pager [phone]  After 6pm go to [email or web address] - password [email or web address] [facility]  Office  [phone]  CC: Primary care physician; Patient, No Pcp Per

## 2018-07-08 NOTE — Progress Notes (Signed)
[facility]  ROUNDING NOTE   Subjective:   Seen and examined on hemodialysis treatment. Tolerating treatment well.    Objective:  Vital signs in last 24 hours:  Temp:  [97.6 F (36.4 C)] 97.6 F (36.4 C) (05/08 1121) Pulse Rate:  [77] 77 (05/08 1121) Resp:  [18] 18 (05/08 1121) BP: (186)/(112) 186/112 (05/08 1121) SpO2:  [96 %] 96 % (05/08 1121) Weight:  [94.4 kg] 94.4 kg (05/08 1116)  Weight change:  Filed Weights   07/08/18 1116  Weight: 94.4 kg    Intake/Output: No intake/output data recorded.   Intake/Output this shift:  No intake/output data recorded.  Physical Exam: General: NAD, sitting in chair  Head: Normocephalic, atraumatic. Moist oral mucosal membranes  Eyes: Anicteric, PERRL  Neck: Supple, trachea midline  Lungs:  Clear to auscultation  Heart: Regular rate and rhythm  Abdomen:  Soft, nontender,   Extremities:  + peripheral edema.  Neurologic: Nonfocal, moving all four extremities  Skin: No lesions   Access: RIJ permcath    Basic Metabolic Panel: Recent Labs  Lab 07/01/18 1849 07/08/18 1205  NA 141 140  K 4.7 6.3*  CL 101 106  CO2 22 20*  GLUCOSE 100* 97  BUN 76* 82*  CREATININE 20.09* 17.19*  CALCIUM 8.1* 8.8*  PHOS 7.2*  --     Liver Function Tests: Recent Labs  Lab 07/01/18 1849  ALBUMIN 3.7   No results for input(s): LIPASE, AMYLASE in the last 168 hours. No results for input(s): AMMONIA in the last 168 hours.  CBC: Recent Labs  Lab 07/01/18 1849 07/08/18 1205  WBC 5.7 6.9  NEUTROABS  --  5.3  HGB 10.1* 10.1*  HCT 31.2* 31.1*  MCV 96.9 97.8  PLT 197 188    Cardiac Enzymes: No results for input(s): CKTOTAL, CKMB, CKMBINDEX, TROPONINI in the last 168 hours.  BNP: Invalid input(s): POCBNP  CBG: No results for input(s): GLUCAP in the last 168 hours.  Microbiology: Results for orders placed or performed during the hospital encounter of 04/08/18  MRSA PCR Screening     Status: None   Collection  Time: 04/09/18  4:03 PM  Result Value Ref Range Status   MRSA by PCR NEGATIVE NEGATIVE Final    Comment:        The GeneXpert MRSA Assay (FDA approved for NASAL specimens only), is one component of a comprehensive MRSA colonization surveillance program. It is not intended to diagnose MRSA infection nor to guide or monitor treatment for MRSA infections. Performed at [facility], [address] [place], [place] [address]     Coagulation Studies: No results for input(s): LABPROT, INR in the last 72 hours.  Urinalysis: No results for input(s): COLORURINE, LABSPEC, PHURINE, GLUCOSEU, HGBUR, BILIRUBINUR, KETONESUR, PROTEINUR, UROBILINOGEN, NITRITE, LEUKOCYTESUR in the last 72 hours.  Invalid input(s): APPERANCEUR    Imaging: Dg Chest Portable 1 View  Result Date: 07/08/2018 CLINICAL DATA:  Shortness of breath EXAM: PORTABLE CHEST 1 VIEW COMPARISON:  06/10/2018 FINDINGS: Dialysis catheter on the right with tip at the right atrium. There is cardiomegaly and congested appearance of the hila. Diffuse interstitial opacity with a few Kerley lines. No effusion or pneumothorax. IMPRESSION: Cardiomegaly and pulmonary edema. Electronically Signed   By: Monte Fantasia M.D.   On: 07/08/2018 11:55     Medications:    . [START ON 07/09/2018] Chlorhexidine Gluconate Cloth  6 each Topical Q0600     Assessment/ Plan:  [name] is a 48 y.o.  black male with end stage renal disease on hemodialysis, hypertension, CVA, sleep apnea, coronary artery disease, COPD, anti-social personality disorder.   1. End Stage Renal Disease with hyperkalemia: last hemodialysis was Monday.  Placed on hemodialysis today. UF goal of 2.5 liters   2. Hypertension: Home regimen of amlodipine, carvedilol, furosemide, hydralazine, isosorbide mononitrate.   3. Anemia of chronic kidney disease: hemoglobin 10.1 - EPO with HD treatment  4. Secondary Hyperparathyroidism:  with hyperphosphatemia -  calcium acetate with meals.    LOS: 0 Fernando Salas 5/8/20202:42 PM

## 2018-07-08 NOTE — ED Notes (Signed)
Report received from dialysis, pt states that he is not willing to stay for admission at this time.  Pt willing to return to room in ED and sign AMA.

## 2018-07-08 NOTE — ED Notes (Signed)
Pt to Dialysis at this time 

## 2018-07-13 ENCOUNTER — Other Ambulatory Visit: Payer: Self-pay

## 2018-07-13 ENCOUNTER — Emergency Department
Admission: EM | Admit: 2018-07-13 | Discharge: 2018-07-13 | Disposition: A | Discharge status: Home / Self Care | Payer: Medicare Other | Attending: Emergency Medicine | Admitting: Emergency Medicine

## 2018-07-13 DIAGNOSIS — Z992 Dependence on renal dialysis: Secondary | ICD-10-CM | POA: Insufficient documentation

## 2018-07-13 DIAGNOSIS — Z8673 Personal history of transient ischemic attack (TIA), and cerebral infarction without residual deficits: Secondary | ICD-10-CM | POA: Insufficient documentation

## 2018-07-13 DIAGNOSIS — Z7901 Long term (current) use of anticoagulants: Secondary | ICD-10-CM | POA: Diagnosis not present

## 2018-07-13 DIAGNOSIS — I120 Hypertensive chronic kidney disease with stage 5 chronic kidney disease or end stage renal disease: Secondary | ICD-10-CM | POA: Diagnosis not present

## 2018-07-13 DIAGNOSIS — I2510 Atherosclerotic heart disease of native coronary artery without angina pectoris: Secondary | ICD-10-CM | POA: Diagnosis not present

## 2018-07-13 DIAGNOSIS — Z79899 Other long term (current) drug therapy: Secondary | ICD-10-CM | POA: Insufficient documentation

## 2018-07-13 DIAGNOSIS — F17210 Nicotine dependence, cigarettes, uncomplicated: Secondary | ICD-10-CM | POA: Insufficient documentation

## 2018-07-13 DIAGNOSIS — K4091 Unilateral inguinal hernia, without obstruction or gangrene, recurrent: Secondary | ICD-10-CM | POA: Insufficient documentation

## 2018-07-13 DIAGNOSIS — N186 End stage renal disease: Secondary | ICD-10-CM | POA: Diagnosis present

## 2018-07-13 LAB — CBC
HCT: 29.7 % — ABNORMAL LOW (ref 39.0–52.0)
Hemoglobin: 9.8 g/dL — ABNORMAL LOW (ref 13.0–17.0)
MCH: 30.9 pg (ref 26.0–34.0)
MCHC: 33 g/dL (ref 30.0–36.0)
MCV: 93.7 fL (ref 80.0–100.0)
Platelets: 179 K/uL (ref 150–400)
RBC: 3.17 MIL/uL — ABNORMAL LOW (ref 4.22–5.81)
RDW: 15.2 % (ref 11.5–15.5)
WBC: 6.2 K/uL (ref 4.0–10.5)
nRBC: 0 % (ref 0.0–0.2)

## 2018-07-13 LAB — RENAL FUNCTION PANEL
Albumin: 3.9 g/dL (ref 3.5–5.0)
Anion gap: 14 (ref 5–15)
BUN: 68 mg/dL — ABNORMAL HIGH (ref 6–20)
CO2: 26 mmol/L (ref 22–32)
Calcium: 8.7 mg/dL — ABNORMAL LOW (ref 8.9–10.3)
Chloride: 101 mmol/L (ref 98–111)
Creatinine, Ser: 13.04 mg/dL — ABNORMAL HIGH (ref 0.61–1.24)
GFR calc Af Amer: 5 mL/min — ABNORMAL LOW (ref >60)
GFR calc non Af Amer: 4 mL/min — ABNORMAL LOW (ref >60)
Glucose, Bld: 100 mg/dL — ABNORMAL HIGH (ref 70–99)
Phosphorus: 4.2 mg/dL (ref 2.5–4.6)
Potassium: 4 mmol/L (ref 3.5–5.1)
Sodium: 141 mmol/L (ref 135–145)

## 2018-07-13 MED ORDER — HEPARIN SODIUM (PORCINE) 1000 UNIT/ML DIALYSIS
20.0000 [IU]/kg | INTRAMUSCULAR | Status: DC | PRN
  Filled 2018-07-13: qty 2

## 2018-07-13 MED ORDER — HYDRALAZINE HCL 20 MG/ML IJ SOLN: 10.0000 mg | Freq: Once | INTRAMUSCULAR | Status: DC

## 2018-07-13 MED ORDER — CHLORHEXIDINE GLUCONATE CLOTH 2 % EX PADS: 6.0000 | MEDICATED_PAD | Freq: Every day | CUTANEOUS | Status: DC

## 2018-07-13 NOTE — Progress Notes (Signed)
Post Tx Assessment  2liters removed, pt states he will take BP meds upon return to home (refuses dressing change and meds)   07/13/18 1700  Neurological  Level of Consciousness Alert  Orientation Level Oriented X4  Respiratory  Respiratory Pattern Regular  Chest Assessment Chest expansion symmetrical  Bilateral Breath Sounds Rhonchi  Cardiac  Pulse Regular  Heart Sounds S1, S2  ECG Monitor No (EKG in ED, refuses ECG monitor in HD )  Vascular  R Radial Pulse +2  L Radial Pulse +2  Edema Generalized  Integumentary  Integumentary (WDL) X  Skin Color Appropriate for ethnicity  Skin Condition Dry  Musculoskeletal  Musculoskeletal (WDL) WDL  Gastrointestinal  Bowel Sounds Assessment Active  GU Assessment  Genitourinary (WDL) X  Genitourinary Symptoms Other (Comment) (HD pt )  Psychosocial  Psychosocial (WDL) WDL

## 2018-07-13 NOTE — ED Notes (Signed)
Admitting MD at bedside.

## 2018-07-13 NOTE — Progress Notes (Signed)
Pre HD Assessment   07/13/18 1300  Neurological  Level of Consciousness Alert  Orientation Level Oriented X4  Respiratory  Respiratory Pattern Regular  Chest Assessment Chest expansion symmetrical  Bilateral Breath Sounds Rhonchi  Cardiac  Pulse Regular  Heart Sounds S1, S2  ECG Monitor No (EKG in ED, refuses ECG monitor in HD )  Vascular  R Radial Pulse +2  L Radial Pulse +2  Edema Generalized  Integumentary  Integumentary (WDL) X  Skin Color Appropriate for ethnicity  Skin Condition Dry  Musculoskeletal  Musculoskeletal (WDL) WDL  Gastrointestinal  Bowel Sounds Assessment Active  GU Assessment  Genitourinary (WDL) X  Genitourinary Symptoms Other (Comment) (HD pt )  Psychosocial  Psychosocial (WDL) WDL

## 2018-07-13 NOTE — Progress Notes (Signed)
HD Tx Start   07/13/18 1300  Vital Signs  Pulse Rate 76  Resp 18  BP (!) 194/115  Oxygen Therapy  SpO2 99 %  During Hemodialysis Assessment  Blood Flow Rate (mL/min) 400 mL/min  Arterial Pressure (mmHg) -180 mmHg  Venous Pressure (mmHg) 200 mmHg  Transmembrane Pressure (mmHg) 50 mmHg  Ultrafiltration Rate (mL/min) 830 mL/min  Dialysate Flow Rate (mL/min) 800 ml/min  Conductivity: Machine  14  HD Safety Checks Performed Yes  Dialysis Fluid Bolus Normal Saline  Bolus Amount (mL) 250 mL  Intra-Hemodialysis Comments Tx initiated  Hemodialysis Catheter Right Subclavian Double-lumen  Placement Date/Time: (c) 02/10/17 1343   Placed prior to admission: Yes  Orientation: Right  Access Location: Subclavian  Hemodialysis Catheter Type: Double-lumen  Site Condition No complications  Blue Lumen Status Infusing  Red Lumen Status Infusing

## 2018-07-13 NOTE — ED Provider Notes (Signed)
[facility] Emergency Department Provider Note ____________________________________________  Time seen: 1135  I have reviewed the triage vital signs and the nursing notes.  HISTORY  Chief Complaint  Dialysis  HPI Fernando Salas is a 47 y.o. male presents himself to the ED requesting hemodialysis.  Patient is known to the ED for recurrent dialysis.  He reports he was last dialyzed here on Friday. He normally dialyzes on MWF. His denies any acute fevers, chills, cough, or chest pain. He requests surgical consultation for a chronic, reducible inguinal hernia.   Past Medical History:  Diagnosis Date  . CAD (coronary artery disease)    status post multiple myocardial infarctions  . COPD (chronic obstructive pulmonary disease) ([place])   . ESRD (end stage renal disease) ([place])   . Hyperlipidemia   . Hypertension   . Myocardial infarction ([place])    " I had 13 heart attacks, 8 massive"  . Proteinuria   . Renal insufficiency   . Secondary hyperparathyroidism of renal origin ([place])   . Shortness of breath dyspnea    " when I drink a cup of cold water too fast"  . Sleep apnea    pt stated " long story" does not wear CPAP  . Stroke [facility])     Patient Active Problem List   Diagnosis Date Noted  . ESRD (end stage renal disease) on dialysis ([place]) 06/27/2018  . Hypertensive urgency, malignant 04/08/2018  . Hypertensive urgency 03/25/2018  . Pulmonary edema 03/09/2017  . 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 Fluid overload 02/08/2017  . Accelerated hypertension 01/12/2017  . AKI (acute kidney injury) ([place])   . Unstable angina ([place]) 11/01/2016    Past Surgical History:  Procedure Laterality Date  . AV FISTULA PLACEMENT Left 01-24-14   By Dr. Hortencia Pilar in [place]  . AV FISTULA PLACEMENT Right 04/30/2014   Procedure: RIGHT BRACHIOCEPHALIC ARTERIOVENOUS (AV) FISTULA CREATION;  Surgeon: Elam Dutch, MD;  Location: [facility] OR;  Service: Vascular;  Laterality:  Right;  . AV FISTULA PLACEMENT Left 05/31/2014   Procedure: Left INSERTION OF ARTERIOVENOUS (AV) GORE-TEX GRAFT ARM;  Surgeon: Elam Dutch, MD;  Location: [place];  Service: Vascular;  Laterality: Left;  . CARDIAC CATHETERIZATION    . CORONARY STENT PLACEMENT     13 stents total  . DIALYSIS/PERMA CATHETER INSERTION N/A 01/15/2017   Procedure: DIALYSIS/PERMA CATHETER INSERTION;  Surgeon: Katha Cabal, MD;  Location: [place] CV LAB;  Service: Cardiovascular;  Laterality: N/A;    Prior to Admission medications   Medication Sig Start Date End Date Taking? Authorizing Provider  amLODipine (NORVASC) 10 MG tablet Take 1 tablet (10 mg total) by mouth daily. 05/01/17 07/01/18  Lisa Roca, MD  atorvastatin (LIPITOR) 40 MG tablet Take 1 tablet (40 mg total) by mouth daily. 11/03/16   Demetrios Loll, MD  calcium acetate (PHOSLO) 667 MG capsule Take 1,334 mg by mouth 3 (three) times daily with meals. 12/03/17 12/03/18  [provider]  carvedilol (COREG) 25 MG tablet Take 1 tablet (25 mg total) by mouth 2 (two) times daily. 05/01/17 07/01/18  Lisa Roca, MD  clopidogrel (PLAVIX) 75 MG tablet Take 1 tablet (75 mg total) by mouth daily. 11/03/16   Demetrios Loll, MD  furosemide (LASIX) 80 MG tablet Take 80 mg by mouth See admin instructions. Take 1 tablet (80mg ) every morning on non-dialysis days 05/16/18   [provider]  hydrALAZINE (APRESOLINE) 100 MG tablet Take 1 tablet (100 mg total) by mouth 4 (  four) times daily. Patient taking differently: Take 100 mg by mouth every 8 (eight) hours.  05/01/17 07/01/18  Lisa Roca, MD  isosorbide dinitrate (ISORDIL) 30 MG tablet Take 30 mg by mouth 3 (three) times daily.     [provider]  Multiple Vitamin (MULTIVITAMIN) tablet Take 1 tablet by mouth daily. 04/10/18   Henreitta Leber, MD    Allergies Ibuprofen; Viagra [sildenafil]; and Clonidine derivatives  Family History  Problem Relation Age of Onset  . Heart disease Mother   .  Hyperlipidemia Mother   . Hypertension Mother   . Kidney disease Father   . Kidney disease Brother   . Hyperlipidemia Sister   . Hyperlipidemia Daughter   . Hypertension Sister   . Hypertension Daughter     Social History Social History   Tobacco Use  . Smoking status: Current Every Day Smoker    Packs/day: 0.25    Years: 25.00    Pack years: 6.25    Types: Cigarettes  . Smokeless tobacco: Never Used  Substance Use Topics  . Alcohol use: No    Alcohol/week: 0.0 standard drinks    Frequency: Never    Comment: occasional  . Drug use: Not Currently    Types: Marijuana    Comment: daily    Review of Systems  Constitutional: Negative for fever. Eyes: Negative for visual changes. ENT: Negative for sore throat. Cardiovascular: Negative for chest pain. Respiratory: Negative for shortness of breath. Gastrointestinal: Negative for abdominal pain, vomiting and diarrhea. Genitourinary: Negative for dysuria. Musculoskeletal: Negative for back pain. Skin: Negative for rash. Neurological: Negative for headaches, focal weakness or numbness. ____________________________________________  PHYSICAL EXAM:  VITAL SIGNS: ED Triage Vitals  Enc Vitals Group     BP 07/13/18 1053 (!) 205/118     Pulse Rate 07/13/18 1053 72     Resp --      Temp --      Temp src --      SpO2 07/13/18 1053 100 %     Weight 07/13/18 1053 225 lb (102.1 kg)     Height 07/13/18 1053 6' (1.829 m)     Head Circumference --      Peak Flow --      Pain Score 07/13/18 1101 0     Pain Loc --      Pain Edu? --      Excl. in [place]? --     Constitutional: Alert and oriented. Well appearing and in no distress. Head: Normocephalic and atraumatic. Eyes: Conjunctivae are normal. Normal extraocular movements Cardiovascular: Normal rate, regular rhythm. Normal distal pulses. 1+ pitting edema to the LEs Respiratory: Normal respiratory effort. No wheezes/rales/rhonchi. Gastrointestinal: Soft and nontender. No  distention. Musculoskeletal: Nontender with normal range of motion in all extremities.  Neurologic:  Normal gait without ataxia. Normal speech and language. No gross focal neurologic deficits are appreciated. Skin:  Skin is warm, dry and intact. No rash noted. ____________________________________________  EKG  See EKG report ____________________________________________  PROCEDURES  Procedures ____________________________________________  INITIAL IMPRESSION / ASSESSMENT AND PLAN / ED COURSE  Fernando Salas was evaluated in Emergency Department on 07/13/2018 for the symptoms described in the history of present illness. He was evaluated in the context of the global COVID-19 pandemic, which necessitated consideration that the patient might be at risk for infection with the SARS-CoV-2 virus that causes COVID-19. Institutional protocols and algorithms that pertain to the evaluation of patients at risk for COVID-19 are in a state of rapid change  based on information released by regulatory bodies including the CDC and federal and state organizations. These policies and algorithms were followed during the patient's care in the ED.  Patient with ED evaluation and request for dialysis. He denies any interim complaints. He endorses that his last dialysis was here at [facility] on Friday. He will also be given referral information for general surgery, at his request for a inguinal hernia.  ____________________________________________  FINAL CLINICAL IMPRESSION(S) / ED DIAGNOSES  Final diagnoses:  Dialysis patient ([place])  Unilateral recurrent inguinal hernia without obstruction or gangrene      Anup Brigham, Dannielle Karvonen, PA-C 07/13/18 1220    Lavonia Drafts, MD 07/13/18 1231

## 2018-07-13 NOTE — ED Triage Notes (Signed)
Pt states he needs dialysis, was last dialyzed at [facility] on Monday.

## 2018-07-13 NOTE — Progress Notes (Signed)
Pre HD    07/13/18 1250  Hand-Off documentation  Report given to (Full Name) Trellis Paganini RN  Report received from (Full Name) Vaughan Basta ED RN  Vital Signs  Temp 97.7 F (36.5 C)  Temp Source Oral  Pulse Rate 80  Pulse Rate Source Monitor  Resp 20  BP (!) 196/113  BP Location Right Arm  BP Method Automatic  Patient Position (if appropriate) Sitting  Oxygen Therapy  SpO2 100 %  O2 Device Room Air  Time-Out for Hemodialysis  What Procedure? Hemodialysis   Pt Identifiers(min of two) First/Last Name;MRN/Account#  Correct Site? Yes  Correct Side? Yes  Correct Procedure? Yes  Consents Verified? Yes  Rad Studies Available? N/A  Safety Precautions Reviewed? Yes  Engineer, civil (consulting) Number 3  Station Number 3  UF/Alarm Test Passed  Conductivity: Meter 14  Conductivity: Machine  14  pH 7.2  Reverse Osmosis Main  Normal Saline Lot Number [id number]  Dialyzer Lot Number [id number]  Disposable Set Lot Number [phone] [id number]  Machine Temperature 98.6 F (37 C)  Musician and Audible Yes  Blood Lines Intact and Secured Yes  Pre Treatment Patient Checks  Vascular access used during treatment Catheter  Patient is receiving dialysis in a chair Yes  Hepatitis B Surface Antigen Results Negative  Date Hepatitis B Surface Antigen Drawn 06/27/18  Hepatitis B Surface Antibody  (<10)  Date Hepatitis B Surface Antibody Drawn 06/27/18  Hemodialysis Consent Verified Yes  Hemodialysis Standing Orders Initiated Yes  ECG (Telemetry) Monitor On Yes  Prime Ordered Normal Saline  Length of  DialysisTreatment -hour(s) 3 Hour(s)  Dialysis Treatment Comments Na 140  Dialyzer Elisio 17H NR  Dialysate 2K, 2.5 Ca  Dialysate Flow Ordered 800  Blood Flow Rate Ordered 400 mL/min  Ultrafiltration Goal 2 Liters  Pre Treatment Labs CBC;Renal panel  Dialysis Blood Pressure Support Ordered Normal Saline  Education / Care Plan  Dialysis Education Provided Yes  Documented Education in Care Plan Yes   Hemodialysis Catheter Right Subclavian Double-lumen  Placement Date/Time: (c) 02/10/17 1343   Placed prior to admission: Yes  Orientation: Right  Access Location: Subclavian  Hemodialysis Catheter Type: Double-lumen  Site Condition No complications  Blue Lumen Status Capped (Central line)  Red Lumen Status Capped (Central line)  Purple Lumen Status N/A  Dressing Type Biopatch;Occlusive  Dressing Status Clean;Dry;Intact  Interventions  (PT REFUSED DRESSING CHANGE)

## 2018-07-13 NOTE — ED Notes (Signed)
Pt wheeled out to lobby.  

## 2018-07-13 NOTE — TOC Progression Note (Addendum)
Transition of Care [facility]) - Progression Note    Patient Details  Name: Fernando Salas MRN: [id number] Date of Birth: [birth year]  Transition of Care [facility]) CM/SW Contact  Ross Ludwig, [place] Phone Number: 07/13/2018, 11:22 AM  Clinical Narrative:     Patient is a 48 year old male who is alert and oriented x4.  Patient has is a dialysis patient who is noncompliant with his dialysis.  Patient normally goes to [facility], he informed bedside nurse that he has not been to dialysis since Monday due to patient not trusting facility.  CSW spoke to [facility] dialysis liaison, [phone] she stated that patient has been kicked out of [facility], due to threatening the facility staff.  Patient has been going to [facility] for dialysis.  Estill Bamberg said patient is not currently being followed by a nephrologist, but she will talk with him to see if he may be a good candidate for home dialysis.  Estill Bamberg will follow up with patient to find out if he wants to consider home dialysis.  CSW signing off please reconsult if social work needs arise.      Expected Discharge Plan and Services    Patient plans to return back home after he receives dialysis if he is medically ready for discharge and orders have been received.           Social Determinants of Health (SDOH) Interventions    Readmission Risk Interventions No flowsheet data found.

## 2018-07-13 NOTE — ED Notes (Signed)
Kayla NT to transport pt from dialysis back to ED room 41.

## 2018-07-13 NOTE — ED Notes (Signed)
Taken to dialysis via w/c    

## 2018-07-13 NOTE — Progress Notes (Signed)
[facility], [place] 07/13/18  Subjective:   LOS: 0 No intake/output data recorded. Patient presents with significant shortness of breath Blood pressure elevated to 196/113 upon presentation. Patient is about 2 kg above his dry weight States he did not take his blood pressure medications today because he was too short of breath Urgent dialysis requested   Objective:  Vital signs in last 24 hours:  Pulse Rate:  [72] 72 (05/13 1053) BP: (205)/(118) 205/118 (05/13 1053) SpO2:  [100 %] 100 % (05/13 1053) Weight:  [102.1 kg] 102.1 kg (05/13 1101)  Weight change:  Filed Weights   07/13/18 1053 07/13/18 1101  Weight: 102.1 kg 102.1 kg    Intake/Output:   No intake or output data in the 24 hours ending 07/13/18 1218   Physical Exam: General:  No acute distress, sitting in the chair  HEENT  anicteric, moist oral mucous membranes  Neck  distended neck veins  Pulm/lungs  mild basilar crackles otherwise clear  CVS/Heart  regular, no rub  Abdomen:   Soft, nontender  Extremities:  Trace to 1+ edema over ankles  Neurologic:  Alert, oriented  Skin:  No acute rashes  Access:  IJ PermCath       Basic Metabolic Panel:  Recent Labs  Lab 07/08/18 1205 07/13/18 1344  NA 140 141  K 6.3* 4.0  CL 106 101  CO2 20* 26  GLUCOSE 97 100*  BUN 82* 68*  CREATININE 17.19* 13.04*  CALCIUM 8.8* 8.7*  PHOS  --  4.2     CBC: Recent Labs  Lab 07/08/18 1205 07/13/18 1344  WBC 6.9 6.2  NEUTROABS 5.3  --   HGB 10.1* 9.8*  HCT 31.1* 29.7*  MCV 97.8 93.7  PLT 188 179      Lab Results  Component Value Date   HEPBSAG Negative 06/27/2018   HEPBSAB Non Reactive 03/09/2017   HEPBIGM Negative 06/27/2018      Microbiology:  No results found for this or any previous visit (from the past 240 hour(s)).  Coagulation Studies: No results for input(s): LABPROT, INR in the last 72 hours.  Urinalysis: No results for input(s): COLORURINE, LABSPEC,  PHURINE, GLUCOSEU, HGBUR, BILIRUBINUR, KETONESUR, PROTEINUR, UROBILINOGEN, NITRITE, LEUKOCYTESUR in the last 72 hours.  Invalid input(s): APPERANCEUR    Imaging: No results found.   Medications:       Assessment/ Plan:  48 y.o. African-American male with with end stage renal disease on hemodialysis, hypertension, CVA, sleep apnea, coronary artery disease, COPD, anti-social personality disorder.   No assigned dialysis unit available  Active Problems:   * No active hospital problems. *   #.  End-stage renal disease, with malignant hypertension and volume overload -Urgent hemodialysis for volume removal -We will arrange for about 2-2.5 kg with dialysis -IV hydralazine for blood pressure control.  Also expected to improve with dialysis -Encouraged patient to take his blood pressure medications at home Recent Labs    07/08/18 1205  CREATININE 17.19*    #. Anemia of CKD  Lab Results  Component Value Date   HGB 10.1 (L) 07/08/2018  Avoid Epogen for now due to malignant hypertension  #. SHPTH     Component Value Date/Time   PTH 545 (H) 02/09/2017 1005   Lab Results  Component Value Date   PHOS 7.2 (H) 07/01/2018  Home medication list includes calcium acetate.  Encourage compliance  #Shortness of breath Patient usually gets short of breath from diastolic dysfunction and malignant hypertension Expected to improve  once blood pressure is better  IV hydralazine prescribed Encouraged patient regarding compliance with home blood pressure medications   LOS: 0 Emaree Chiu 5/13/202012:18 PM  [facility] [place], [place]

## 2018-07-13 NOTE — ED Notes (Addendum)
See triage note  States he needs dialysis  Last time he had a treatment was Friday

## 2018-07-13 NOTE — Progress Notes (Signed)
Post HD Tx   07/13/18 1615  Hand-Off documentation  Report given to (Full Name) Metta Clines RN ED  Report received from (Full Name) Trellis Paganini RN  Vital Signs  Pulse Rate 72  Resp 18  BP (!) 198/116  Oxygen Therapy  SpO2 100 %  Dialysis Weight  Weight 97 kg  Type of Weight Post-Dialysis  Estimated Dry Weight 97 kg  Post-Hemodialysis Assessment  Rinseback Volume (mL) 250 mL  Dialyzer Clearance Lightly streaked  Duration of HD Treatment -hour(s) 3 hour(s)  Hemodialysis Intake (mL) 500 mL  UF Total -Machine (mL) 2500 mL  Net UF (mL) 2000 mL  Tolerated HD Treatment Yes  Hemodialysis Catheter Right Subclavian Double-lumen  Placement Date/Time: (c) 02/10/17 1343   Placed prior to admission: Yes  Orientation: Right  Access Location: Subclavian  Hemodialysis Catheter Type: Double-lumen  Site Condition No complications  Blue Lumen Status Flushed;Capped (Central line);Heparin locked  Red Lumen Status Flushed;Capped (Central line);Heparin locked  Purple Lumen Status N/A  Catheter fill solution Heparin 1000 units/ml  Catheter fill volume (Arterial) 2.4 cc  Catheter fill volume (Venous) 2.4  Dressing Type Biopatch;Occlusive  Dressing Status Clean;Dry;Intact  Drainage Description None  Post treatment catheter status Capped and Clamped

## 2018-07-15 ENCOUNTER — Emergency Department: Payer: Medicare Other

## 2018-07-15 ENCOUNTER — Emergency Department
Admission: EM | Admit: 2018-07-15 | Discharge: 2018-07-15 | Disposition: A | Discharge status: Home / Self Care | Payer: Medicare Other | Attending: Emergency Medicine | Admitting: Emergency Medicine

## 2018-07-15 ENCOUNTER — Other Ambulatory Visit: Payer: Self-pay

## 2018-07-15 ENCOUNTER — Encounter: Payer: Self-pay | Admitting: Emergency Medicine

## 2018-07-15 DIAGNOSIS — E8770 Fluid overload, unspecified: Secondary | ICD-10-CM

## 2018-07-15 DIAGNOSIS — I120 Hypertensive chronic kidney disease with stage 5 chronic kidney disease or end stage renal disease: Secondary | ICD-10-CM | POA: Insufficient documentation

## 2018-07-15 DIAGNOSIS — I2510 Atherosclerotic heart disease of native coronary artery without angina pectoris: Secondary | ICD-10-CM | POA: Diagnosis not present

## 2018-07-15 DIAGNOSIS — I252 Old myocardial infarction: Secondary | ICD-10-CM | POA: Insufficient documentation

## 2018-07-15 DIAGNOSIS — F17210 Nicotine dependence, cigarettes, uncomplicated: Secondary | ICD-10-CM | POA: Diagnosis not present

## 2018-07-15 DIAGNOSIS — Z79899 Other long term (current) drug therapy: Secondary | ICD-10-CM | POA: Diagnosis not present

## 2018-07-15 DIAGNOSIS — Z955 Presence of coronary angioplasty implant and graft: Secondary | ICD-10-CM | POA: Insufficient documentation

## 2018-07-15 DIAGNOSIS — N186 End stage renal disease: Secondary | ICD-10-CM

## 2018-07-15 DIAGNOSIS — Z7901 Long term (current) use of anticoagulants: Secondary | ICD-10-CM | POA: Diagnosis not present

## 2018-07-15 DIAGNOSIS — J449 Chronic obstructive pulmonary disease, unspecified: Secondary | ICD-10-CM | POA: Diagnosis not present

## 2018-07-15 DIAGNOSIS — Z8673 Personal history of transient ischemic attack (TIA), and cerebral infarction without residual deficits: Secondary | ICD-10-CM | POA: Diagnosis not present

## 2018-07-15 DIAGNOSIS — R0602 Shortness of breath: Secondary | ICD-10-CM | POA: Diagnosis present

## 2018-07-15 LAB — COMPREHENSIVE METABOLIC PANEL
ALT: 12 U/L (ref 0–44)
AST: 15 U/L (ref 15–41)
Albumin: 3.9 g/dL (ref 3.5–5.0)
Alkaline Phosphatase: 59 U/L (ref 38–126)
Anion gap: 16 — ABNORMAL HIGH (ref 5–15)
BUN: 77 mg/dL — ABNORMAL HIGH (ref 6–20)
CO2: 23 mmol/L (ref 22–32)
Calcium: 8.9 mg/dL (ref 8.9–10.3)
Chloride: 101 mmol/L (ref 98–111)
Creatinine, Ser: 17 mg/dL — ABNORMAL HIGH (ref 0.61–1.24)
GFR calc Af Amer: 3 mL/min — ABNORMAL LOW (ref >60)
GFR calc non Af Amer: 3 mL/min — ABNORMAL LOW (ref >60)
Glucose, Bld: 109 mg/dL — ABNORMAL HIGH (ref 70–99)
Potassium: 4.9 mmol/L (ref 3.5–5.1)
Sodium: 140 mmol/L (ref 135–145)
Total Bilirubin: 1 mg/dL (ref 0.3–1.2)
Total Protein: 7.9 g/dL (ref 6.5–8.1)

## 2018-07-15 LAB — CBC
HCT: 30.4 % — ABNORMAL LOW (ref 39.0–52.0)
Hemoglobin: 10 g/dL — ABNORMAL LOW (ref 13.0–17.0)
MCH: 31.2 pg (ref 26.0–34.0)
MCHC: 32.9 g/dL (ref 30.0–36.0)
MCV: 94.7 fL (ref 80.0–100.0)
Platelets: 180 10*3/uL (ref 150–400)
RBC: 3.21 MIL/uL — ABNORMAL LOW (ref 4.22–5.81)
RDW: 15.2 % (ref 11.5–15.5)
WBC: 6.8 10*3/uL (ref 4.0–10.5)
nRBC: 0 % (ref 0.0–0.2)

## 2018-07-15 LAB — PHOSPHORUS: Phosphorus: 3.1 mg/dL (ref 2.5–4.6)

## 2018-07-15 MED ORDER — CHLORHEXIDINE GLUCONATE CLOTH 2 % EX PADS: 6.0000 | MEDICATED_PAD | Freq: Every day | CUTANEOUS | Status: DC

## 2018-07-15 MED ORDER — HYDRALAZINE HCL 20 MG/ML IJ SOLN: 10.0000 mg | Freq: Once | INTRAMUSCULAR | Status: DC

## 2018-07-15 MED ORDER — HEPARIN SODIUM (PORCINE) 1000 UNIT/ML DIALYSIS
20.0000 [IU]/kg | INTRAMUSCULAR | Status: DC | PRN
  Filled 2018-07-15: qty 2

## 2018-07-15 NOTE — ED Notes (Signed)
Refused discharge vitals.

## 2018-07-15 NOTE — ED Notes (Signed)
Patient refused Hydralazine.Stated that he has this medication at home and that he will take it there. x2

## 2018-07-15 NOTE — ED Triage Notes (Signed)
Patient presents to the ED for dialysis.  Patient states he usually gets dialysis at [place] but has been unable to get there.  Last dialysis was Wednesday at [facility].  Patient also states he has had shortness of breath that woke him from sleep at 2:30am and since.  Patient also reports vomiting x 1 prior to arrival today.

## 2018-07-15 NOTE — ED Provider Notes (Signed)
[facility] Emergency Department Provider Note  Time seen: 11:22 AM  I have reviewed the triage vital signs and the nursing notes.   HISTORY  Chief Complaint Vascular Access Problem and Shortness of Breath   HPI Fernando Salas is a 48 y.o. male with a past medical history of CAD, COPD, end-stage renal disease, hypertension, hyperlipidemia, MI, presents to the emergency department for shortness of breath.  According to the patient he is due to have dialysis today, gets his dialysis performed at [facility], but states he did not think he could make it to [facility] so he came here for dialysis per patient.  Patient states mild shortness of breath with one episode of vomiting this morning.  Patient is requesting dialysis.  Denies any fever cough or congestion.   Past Medical History:  Diagnosis Date  . CAD (coronary artery disease)    status post multiple myocardial infarctions  . COPD (chronic obstructive pulmonary disease) ([place])   . ESRD (end stage renal disease) ([place])   . Hyperlipidemia   . Hypertension   . Myocardial infarction ([place])    " I had 13 heart attacks, 8 massive"  . Proteinuria   . Renal insufficiency   . Secondary hyperparathyroidism of renal origin ([place])   . Shortness of breath dyspnea    " when I drink a cup of cold water too fast"  . Sleep apnea    pt stated " long story" does not wear CPAP  . Stroke [facility])     Patient Active Problem List   Diagnosis Date Noted  . ESRD (end stage renal disease) on dialysis ([place]) 06/27/2018  . Hypertensive urgency, malignant 04/08/2018  . Hypertensive urgency 03/25/2018  . Pulmonary edema 03/09/2017  . 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 Fluid overload 02/08/2017  . Accelerated hypertension 01/12/2017  . AKI (acute kidney injury) ([place])   . Unstable angina ([place]) 11/01/2016    Past Surgical History:  Procedure Laterality Date  . AV FISTULA PLACEMENT Left 01-24-14   By Dr. Hortencia Pilar in  [place]  . AV FISTULA PLACEMENT Right 04/30/2014   Procedure: RIGHT BRACHIOCEPHALIC ARTERIOVENOUS (AV) FISTULA CREATION;  Surgeon: Elam Dutch, MD;  Location: [facility] OR;  Service: Vascular;  Laterality: Right;  . AV FISTULA PLACEMENT Left 05/31/2014   Procedure: Left INSERTION OF ARTERIOVENOUS (AV) GORE-TEX GRAFT ARM;  Surgeon: Elam Dutch, MD;  Location: [place];  Service: Vascular;  Laterality: Left;  . CARDIAC CATHETERIZATION    . CORONARY STENT PLACEMENT     13 stents total  . DIALYSIS/PERMA CATHETER INSERTION N/A 01/15/2017   Procedure: DIALYSIS/PERMA CATHETER INSERTION;  Surgeon: Katha Cabal, MD;  Location: [place] CV LAB;  Service: Cardiovascular;  Laterality: N/A;    Prior to Admission medications   Medication Sig Start Date End Date Taking? Authorizing Provider  amLODipine (NORVASC) 10 MG tablet Take 1 tablet (10 mg total) by mouth daily. 05/01/17 07/01/18  Lisa Roca, MD  atorvastatin (LIPITOR) 40 MG tablet Take 1 tablet (40 mg total) by mouth daily. 11/03/16   Demetrios Loll, MD  calcium acetate (PHOSLO) 667 MG capsule Take 1,334 mg by mouth 3 (three) times daily with meals. 12/03/17 12/03/18  [provider]  carvedilol (COREG) 25 MG tablet Take 1 tablet (25 mg total) by mouth 2 (two) times daily. 05/01/17 07/01/18  Lisa Roca, MD  clopidogrel (PLAVIX) 75 MG tablet Take 1 tablet (75 mg total) by mouth daily. 11/03/16   Demetrios Loll, MD  furosemide (LASIX) 80 MG tablet Take 80 mg by mouth See admin instructions. Take 1 tablet (80mg ) every morning on non-dialysis days 05/16/18   [provider]  hydrALAZINE (APRESOLINE) 100 MG tablet Take 1 tablet (100 mg total) by mouth 4 (four) times daily. Patient taking differently: Take 100 mg by mouth every 8 (eight) hours.  05/01/17 07/01/18  Lisa Roca, MD  isosorbide dinitrate (ISORDIL) 30 MG tablet Take 30 mg by mouth 3 (three) times daily.     [provider]  Multiple Vitamin (MULTIVITAMIN) tablet Take 1  tablet by mouth daily. 04/10/18   Henreitta Leber, MD    Allergies  Allergen Reactions  . Ibuprofen Other (See Comments)    Kidney   . Viagra [Sildenafil]     Lips swell   . Clonidine Derivatives Rash    Family History  Problem Relation Age of Onset  . Heart disease Mother   . Hyperlipidemia Mother   . Hypertension Mother   . Kidney disease Father   . Kidney disease Brother   . Hyperlipidemia Sister   . Hyperlipidemia Daughter   . Hypertension Sister   . Hypertension Daughter     Social History Social History   Tobacco Use  . Smoking status: Current Every Day Smoker    Packs/day: 0.25    Years: 25.00    Pack years: 6.25    Types: Cigarettes  . Smokeless tobacco: Never Used  Substance Use Topics  . Alcohol use: No    Alcohol/week: 0.0 standard drinks    Frequency: Never    Comment: occasional  . Drug use: Not Currently    Types: Marijuana    Comment: daily    Review of Systems Constitutional: Negative for fever. ENT: Negative for recent illness/congestion Cardiovascular: Negative for chest pain. Respiratory: Mild shortness of breath Gastrointestinal: Negative for abdominal pain.  One episode of vomiting this morning. Musculoskeletal: Negative for musculoskeletal complaints Skin: Negative for skin complaints  Neurological: Negative for headache All other ROS negative  ____________________________________________   PHYSICAL EXAM:  VITAL SIGNS: ED Triage Vitals  Enc Vitals Group     BP 07/15/18 1058 (!) 184/115     Pulse Rate 07/15/18 1058 88     Resp 07/15/18 1058 16     Temp 07/15/18 1058 97.7 F (36.5 C)     Temp Source 07/15/18 1058 Oral     SpO2 07/15/18 1058 93 %     Weight 07/15/18 1059 225 lb (102.1 kg)     Height 07/15/18 1059 6' (1.829 m)     Head Circumference --      Peak Flow --      Pain Score 07/15/18 1059 0     Pain Loc --      Pain Edu? --      Excl. in [place]? --    Constitutional: Alert and oriented. Well appearing and in no  distress. Eyes: Normal exam ENT      Head: Normocephalic and atraumatic      Mouth/Throat: Mucous membranes are moist. Cardiovascular: Normal rate, regular rhythm.  Respiratory: Normal respiratory effort without tachypnea nor retractions. Breath sounds are clear.  Right subclavian catheter noted in the right chest. Gastrointestinal: Soft and nontender. No distention.  Musculoskeletal: Nontender with normal range of motion in all extremities. Neurologic:  Normal speech and language. No gross focal neurologic deficits  Skin:  Skin is warm, dry and intact.  Psychiatric: Mood and affect are normal.  ____________________________________________    EKG  EKG viewed and interpreted by myself shows a normal sinus rhythm at 91 bpm with a narrow QRS, normal axis, slight QTC prolongation otherwise normal intervals, nonspecific ST changes.  ____________________________________________    RADIOLOGY  IMPRESSION: Vascular congestion and pulmonary edema consistent with fluid overload.  ____________________________________________   INITIAL IMPRESSION / ASSESSMENT AND PLAN / ED COURSE  Pertinent labs & imaging results that were available during my care of the patient were reviewed by me and considered in my medical decision making (see chart for details).   Patient presents to the emergency department for shortness of breath and one episode of vomiting this morning.  Patient is requesting his dialysis.  States he has dialysis scheduled at [facility] but did not want to go to [facility], states he has transportation issues.  States he was feeling short of breath so he came to the emergency department here.  We will check labs, chest x-ray and continue to closely monitor.  We will discuss with nephrology once results are known for further recommendations.  Patient's labs are largely at baseline.  X-ray consistent with vascular congestion and pulmonary edema consistent with fluid overload.  I discussed the  patient with Dr. Candiss Norse of nephrology who will arrange for dialysis for the patient today.  We will discharge after the patient returns from dialysis.   Fernando Salas was evaluated in Emergency Department on 07/15/2018 for the symptoms described in the history of present illness. He was evaluated in the context of the global COVID-19 pandemic, which necessitated consideration that the patient might be at risk for infection with the SARS-CoV-2 virus that causes COVID-19. Institutional protocols and algorithms that pertain to the evaluation of patients at risk for COVID-19 are in a state of rapid change based on information released by regulatory bodies including the CDC and federal and state organizations. These policies and algorithms were followed during the patient's care in the ED.  ____________________________________________   FINAL CLINICAL IMPRESSION(S) / ED DIAGNOSES  Dyspnea Dialysis   Harvest Dark, MD 07/15/18 1450

## 2018-07-15 NOTE — Progress Notes (Signed)
[facility], [place] 07/15/18  Subjective:   LOS: 0 No intake/output data recorded. Patient presents with significant shortness of breath Blood pressure elevated to 192/119 upon presentation. Refusing cardiac lead placement as well as oxygen supplementation in the ER.  Got into argument with the nurse. Urgent dialysis requested   Objective:  Vital signs in last 24 hours:  Temp:  [97.7 F (36.5 C)-98.6 F (37 C)] 98.6 F (37 C) (05/15 1350) Pulse Rate:  [85-90] 85 (05/15 1645) Resp:  [16-21] 19 (05/15 1645) BP: (161-198)/(110-127) 161/114 (05/15 1645) SpO2:  [93 %-100 %] 100 % (05/15 1645) Weight:  [93 kg-102.1 kg] 93 kg (05/15 1350)  Weight change:  Filed Weights   07/15/18 1059 07/15/18 1350  Weight: 102.1 kg 93 kg    Intake/Output:   No intake or output data in the 24 hours ending 07/15/18 1653   Physical Exam: General:  No acute distress, leaning over the bed in the ER  HEENT  anicteric, moist oral mucous membranes  Neck  distended neck veins  Pulm/lungs  diffuse mild basilar crackles    CVS/Heart  regular, no rub  Abdomen:   Soft, nontender  Extremities:  Trace to 1+ edema over ankles  Neurologic:  Alert, oriented  Skin:  No acute rashes  Access:  IJ PermCath       Basic Metabolic Panel:  Recent Labs  Lab 07/13/18 1344 07/15/18 1123  NA 141 140  K 4.0 4.9  CL 101 101  CO2 26 23  GLUCOSE 100* 109*  BUN 68* 77*  CREATININE 13.04* 17.00*  CALCIUM 8.7* 8.9  PHOS 4.2  --      CBC: Recent Labs  Lab 07/13/18 1344 07/15/18 1123  WBC 6.2 6.8  HGB 9.8* 10.0*  HCT 29.7* 30.4*  MCV 93.7 94.7  PLT 179 180      Lab Results  Component Value Date   HEPBSAG Negative 06/27/2018   HEPBSAB Non Reactive 03/09/2017   HEPBIGM Negative 06/27/2018      Microbiology:  No results found for this or any previous visit (from the past 240 hour(s)).  Coagulation Studies: No results for input(s): LABPROT, INR in the last 72  hours.  Urinalysis: No results for input(s): COLORURINE, LABSPEC, PHURINE, GLUCOSEU, HGBUR, BILIRUBINUR, KETONESUR, PROTEINUR, UROBILINOGEN, NITRITE, LEUKOCYTESUR in the last 72 hours.  Invalid input(s): APPERANCEUR    Imaging: Dg Chest Portable 1 View  Result Date: 07/15/2018 CLINICAL DATA:  Shortness of breath EXAM: PORTABLE CHEST 1 VIEW COMPARISON:  07/08/2018 FINDINGS: Cardiac shadow remains enlarged. Dialysis catheter is again seen. Vascular congestion and pulmonary edema is identified similar to that noted on the prior exam likely related to volume overload. No bony abnormality is seen. IMPRESSION: Vascular congestion and pulmonary edema consistent with fluid overload. Electronically Signed   By: Inez Catalina M.D.   On: 07/15/2018 12:12     Medications:       Assessment/ Plan:  47 y.o. African-American male with with end stage renal disease on hemodialysis, hypertension, CVA, sleep apnea, coronary artery disease, COPD, anti-social personality disorder.   No assigned dialysis unit available  Active Problems:   * No active hospital problems. *   #.  End-stage renal disease, with malignant hypertension and volume overload -Urgent hemodialysis for volume removal -IV hydralazine for blood pressure control.  Also expected to improve with dialysis -Encouraged patient to take his blood pressure medications at home Recent Labs    07/13/18 1344 07/15/18 1123  CREATININE 13.04* 17.00*    #.  Anemia of CKD  Lab Results  Component Value Date   HGB 10.0 (L) 07/15/2018  Avoid Epogen for now due to malignant hypertension  #. SHPTH     Component Value Date/Time   PTH 545 (H) 02/09/2017 1005   Lab Results  Component Value Date   PHOS 4.2 07/13/2018  Home medication list includes calcium acetate.  Encourage compliance  #Shortness of breath Patient usually gets short of breath from diastolic dysfunction and malignant hypertension Expected to improve once blood pressure  is better  IV hydralazine prescribed Encouraged patient regarding compliance with home blood pressure medications   LOS: 0 Fernando Salas [facility] 5/15/20204:53 PM  [place], [place]

## 2018-07-15 NOTE — Progress Notes (Signed)
HD Tx End No fluid removal - pt refused UF removal this tx. Arrived at 3 kg under ETW.    07/15/18 1730  Vital Signs  Pulse Rate 88  Resp 18  BP (!) 190/115  Oxygen Therapy  SpO2 100 %  O2 Device Room Air  Pain Assessment  Pain Scale 0-10  Pain Score 0  During Hemodialysis Assessment  Blood Flow Rate (mL/min) 200 mL/min  Arterial Pressure (mmHg) -160 mmHg  Venous Pressure (mmHg) 180 mmHg  Transmembrane Pressure (mmHg) 50 mmHg  Ultrafiltration Rate (mL/min) 170 mL/min  Dialysate Flow Rate (mL/min) 800 ml/min  Conductivity: Machine  14  HD Safety Checks Performed Yes  Intra-Hemodialysis Comments Tx completed

## 2018-07-15 NOTE — Progress Notes (Signed)
Post HD Assessment   07/15/18 1800  Neurological  Level of Consciousness Alert  Orientation Level Oriented X4  Respiratory  Respiratory Pattern Regular  Chest Assessment Chest expansion symmetrical  Bilateral Breath Sounds Diminished  Cardiac  Pulse Regular  Heart Sounds S1, S2  ECG Monitor No (EKG in ED, refuses ECG monitor in HD )  Vascular  R Radial Pulse +2  L Radial Pulse +2  Edema Generalized  Integumentary  Integumentary (WDL) X  Skin Color Appropriate for ethnicity  Skin Condition Dry  Musculoskeletal  Musculoskeletal (WDL) WDL  Gastrointestinal  Bowel Sounds Assessment Active  GU Assessment  Genitourinary (WDL) X  Genitourinary Symptoms Other (Comment) (HD pt )  Psychosocial  Psychosocial (WDL) WDL     07/15/18 1800  Neurological  Level of Consciousness Alert  Orientation Level Oriented X4  Respiratory  Respiratory Pattern Regular  Chest Assessment Chest expansion symmetrical  Bilateral Breath Sounds Diminished  Cardiac  Pulse Regular  Heart Sounds S1, S2  ECG Monitor No (EKG in ED, refuses ECG monitor in HD )  Vascular  R Radial Pulse +2  L Radial Pulse +2  Edema Generalized  Integumentary  Integumentary (WDL) X  Skin Color Appropriate for ethnicity  Skin Condition Dry  Musculoskeletal  Musculoskeletal (WDL) WDL  Gastrointestinal  Bowel Sounds Assessment Active  GU Assessment  Genitourinary (WDL) X  Genitourinary Symptoms Other (Comment) (HD pt )  Psychosocial  Psychosocial (WDL) WDL

## 2018-07-15 NOTE — ED Notes (Signed)
Patient refused to be on monitor x3

## 2018-07-15 NOTE — Progress Notes (Addendum)
Pre HD Assessment    07/15/18 1400  Neurological  Level of Consciousness Alert  Orientation Level Oriented X4  Respiratory  Respiratory Pattern Regular;Dyspnea with exertion  Chest Assessment Chest expansion symmetrical  Bilateral Breath Sounds Diminished  Cardiac  Pulse Regular  Heart Sounds S1, S2  ECG Monitor No (EKG in ED, refuses ECG monitor in HD )  Vascular  R Radial Pulse +2  L Radial Pulse +2  Edema Generalized  Integumentary  Integumentary (WDL) X  Skin Color Appropriate for ethnicity  Skin Condition Dry  Musculoskeletal  Musculoskeletal (WDL) WDL  Gastrointestinal  Bowel Sounds Assessment Active  GU Assessment  Genitourinary (WDL) X  Genitourinary Symptoms Other (Comment) (HD pt )  Psychosocial  Psychosocial (WDL) WDL

## 2018-07-15 NOTE — Discharge Instructions (Signed)
Please follow-up with your dialysis team on Monday your next session of dialysis.  Return to the emergency department for any trouble breathing, or any other symptom personally concerning to yourself.

## 2018-07-15 NOTE — Progress Notes (Signed)
Post HD Tx  No fluid removal - pt refused UF removal this tx. Arrived at 3 kg under ETW.   07/15/18 1745  Vital Signs  Pulse Rate 82  Resp 20  BP (!) 198/112  Oxygen Therapy  SpO2 100 %  Post-Hemodialysis Assessment  Rinseback Volume (mL) 250 mL  Dialyzer Clearance Lightly streaked  Duration of HD Treatment -hour(s) 3.5 hour(s)  Hemodialysis Intake (mL) 500 mL  UF Total -Machine (mL) 500 mL  Net UF (mL) 0 mL  Tolerated HD Treatment Yes  Hemodialysis Catheter Right Subclavian Double-lumen  Placement Date/Time: (c) 02/10/17 1343   Placed prior to admission: Yes  Orientation: Right  Access Location: Subclavian  Hemodialysis Catheter Type: Double-lumen  Site Condition No complications  Blue Lumen Status Flushed;Capped (Central line);Heparin locked  Red Lumen Status Flushed;Capped (Central line);Heparin locked  Catheter fill solution Heparin 1000 units/ml  Catheter fill volume (Arterial) 2.4 cc  Catheter fill volume (Venous) 2.4  Dressing Type Biopatch;Occlusive  Dressing Status Clean;Dry;Intact  Interventions  (pt refused dressing change)  Drainage Description None  Post treatment catheter status Capped and Clamped

## 2018-07-15 NOTE — ED Notes (Signed)
AAOx3.  Skin warm and dry. NAD.  States "I am always short of breath before dialysis".  Refusing cardiac monitoring.

## 2018-07-15 NOTE — Progress Notes (Addendum)
Pre HD Tx    07/15/18 1350  Hand-Off documentation  Report given to (Full Name) Trellis Paganini RN  Report received from (Full Name) Gwynn Burly RN  Vital Signs  Temp 98.6 F (37 C)  Temp Source Oral  Pulse Rate 89  Pulse Rate Source Monitor  Resp 20  BP (!) 198/112  BP Location Right Arm  BP Method Automatic  Patient Position (if appropriate) Sitting  Oxygen Therapy  SpO2 100 %  O2 Device Room Air  Dialysis Weight  Weight 93 kg  Type of Weight Pre-Dialysis  Estimated Dry Weight 96 kg  Time-Out for Hemodialysis  What Procedure? Hemodialysis  Pt Identifiers(min of two) First/Last Name;MRN/Account#  Correct Site? Yes  Correct Side? Yes  Correct Procedure? Yes  Consents Verified? Yes  Rad Studies Available? N/A  Safety Precautions Reviewed? Yes  Engineer, civil (consulting) Number 1  Station Number 3  UF/Alarm Test Passed  Conductivity: Meter 14  Conductivity: Machine  13.8  pH 7.4  Reverse Osmosis Main  Normal Saline Lot Number [id number]  Dialyzer Lot Number [id number]  Disposable Set Lot Number [id number]  Machine Temperature 98.6 F (37 C)  Musician and Audible Yes  Blood Lines Intact and Secured Yes  Pre Treatment Patient Checks  Vascular access used during treatment Catheter  Patient is receiving dialysis in a chair Yes  Hepatitis B Surface Antigen Results Negative  Date Hepatitis B Surface Antigen Drawn 06/27/18  Hepatitis B Surface Antibody  (<10)  Date Hepatitis B Surface Antibody Drawn 06/27/18  Hemodialysis Consent Verified Yes  Hemodialysis Standing Orders Initiated Yes  ECG (Telemetry) Monitor On Yes  Prime Ordered Normal Saline  Length of  DialysisTreatment -hour(s) 3.5 Hour(s)  Dialysis Treatment Comments Na 140  Dialyzer Elisio 17H NR  Dialysate 2K, 2.5 Ca  Dialysate Flow Ordered 800  Blood Flow Rate Ordered 400 mL/min  Ultrafiltration Goal 0 Liters  Dialysis Blood Pressure Support Ordered Normal Saline  Education / Care Plan  Dialysis  Education Provided Yes  Documented Education in Care Plan Yes  Hemodialysis Catheter Right Subclavian Double-lumen  Placement Date/Time: (c) 02/10/17 1343   Placed prior to admission: Yes  Orientation: Right  Access Location: Subclavian  Hemodialysis Catheter Type: Double-lumen  Site Condition No complications  Blue Lumen Status Capped (Central line)  Red Lumen Status Capped (Central line)  Dressing Type Biopatch;Occlusive  Dressing Status Clean;Dry;Intact  Interventions  (patient refused dressing change after education)

## 2018-07-15 NOTE — ED Notes (Signed)
Patient verbalized  The need  For Follow up care understanding.

## 2018-07-15 NOTE — ED Notes (Signed)
To Dialysis.  AAOx3.  Skin warm and dry.  NAD

## 2018-07-15 NOTE — ED Notes (Signed)
Patient denies pain and is resting comfortably.  

## 2018-07-22 ENCOUNTER — Other Ambulatory Visit: Payer: Self-pay

## 2018-07-22 ENCOUNTER — Emergency Department
Admission: EM | Admit: 2018-07-22 | Discharge: 2018-07-22 | Disposition: A | Discharge status: Home / Self Care | Payer: Medicare Other | Attending: Emergency Medicine | Admitting: Emergency Medicine

## 2018-07-22 DIAGNOSIS — I252 Old myocardial infarction: Secondary | ICD-10-CM | POA: Insufficient documentation

## 2018-07-22 DIAGNOSIS — Z79899 Other long term (current) drug therapy: Secondary | ICD-10-CM | POA: Diagnosis not present

## 2018-07-22 DIAGNOSIS — I2510 Atherosclerotic heart disease of native coronary artery without angina pectoris: Secondary | ICD-10-CM | POA: Diagnosis not present

## 2018-07-22 DIAGNOSIS — F17210 Nicotine dependence, cigarettes, uncomplicated: Secondary | ICD-10-CM | POA: Insufficient documentation

## 2018-07-22 DIAGNOSIS — J449 Chronic obstructive pulmonary disease, unspecified: Secondary | ICD-10-CM | POA: Insufficient documentation

## 2018-07-22 DIAGNOSIS — N186 End stage renal disease: Secondary | ICD-10-CM | POA: Diagnosis not present

## 2018-07-22 DIAGNOSIS — Z992 Dependence on renal dialysis: Secondary | ICD-10-CM | POA: Diagnosis not present

## 2018-07-22 DIAGNOSIS — I120 Hypertensive chronic kidney disease with stage 5 chronic kidney disease or end stage renal disease: Secondary | ICD-10-CM | POA: Diagnosis not present

## 2018-07-22 MED ORDER — Medication
1.80 | Status: DC
Start: ? — End: 2018-07-22

## 2018-07-22 MED ORDER — Medication
1.90 | Status: DC
Start: ? — End: 2018-07-22

## 2018-07-22 MED ORDER — Medication
4000.00 | Status: DC
Start: ? — End: 2018-07-22

## 2018-07-22 NOTE — ED Provider Notes (Signed)
[facility] Emergency Department Provider Note  ____________________________________________   First MD Initiated Contact with Patient 07/22/18 1137     (approximate)  I have reviewed the triage vital signs and the nursing notes.   HISTORY  Chief Complaint Vascular Access Problem    HPI Fernando Salas is a 48 y.o. male presents emergency department in need of dialysis.  He denies any new  Complaints.  He denies any other problems..    Past Medical History:  Diagnosis Date  . CAD (coronary artery disease)    status post multiple myocardial infarctions  . COPD (chronic obstructive pulmonary disease) ([place])   . ESRD (end stage renal disease) ([place])   . Hyperlipidemia   . Hypertension   . Myocardial infarction ([place])    " I had 13 heart attacks, 8 massive"  . Proteinuria   . Renal insufficiency   . Secondary hyperparathyroidism of renal origin ([place])   . Shortness of breath dyspnea    " when I drink a cup of cold water too fast"  . Sleep apnea    pt stated " long story" does not wear CPAP  . Stroke [facility])     Patient Active Problem List   Diagnosis Date Noted  . ESRD (end stage renal disease) on dialysis ([place]) 06/27/2018  . Hypertensive urgency, malignant 04/08/2018  . Hypertensive urgency 03/25/2018  . Pulmonary edema 03/09/2017  . 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 Fluid overload 02/08/2017  . Accelerated hypertension 01/12/2017  . AKI (acute kidney injury) ([place])   . Unstable angina ([place]) 11/01/2016    Past Surgical History:  Procedure Laterality Date  . AV FISTULA PLACEMENT Left 01-24-14   By Dr. Hortencia Pilar in [place]  . AV FISTULA PLACEMENT Right 04/30/2014   Procedure: RIGHT BRACHIOCEPHALIC ARTERIOVENOUS (AV) FISTULA CREATION;  Surgeon: Elam Dutch, MD;  Location: [facility] OR;  Service: Vascular;  Laterality: Right;  . AV FISTULA PLACEMENT Left 05/31/2014   Procedure: Left INSERTION OF ARTERIOVENOUS (AV) GORE-TEX GRAFT ARM;   Surgeon: Elam Dutch, MD;  Location: [place];  Service: Vascular;  Laterality: Left;  . CARDIAC CATHETERIZATION    . CORONARY STENT PLACEMENT     13 stents total  . DIALYSIS/PERMA CATHETER INSERTION N/A 01/15/2017   Procedure: DIALYSIS/PERMA CATHETER INSERTION;  Surgeon: Katha Cabal, MD;  Location: [place] [facility];  Service: Cardiovascular;  Laterality: N/A;    Prior to Admission medications   Medication Sig Start Date End Date Taking? Authorizing Provider  amLODipine (NORVASC) 10 MG tablet Take 1 tablet (10 mg total) by mouth daily. 05/01/17 07/01/18  Lisa Roca, MD  atorvastatin (LIPITOR) 40 MG tablet Take 1 tablet (40 mg total) by mouth daily. 11/03/16   Demetrios Loll, MD  calcium acetate (PHOSLO) 667 MG capsule Take 1,334 mg by mouth 3 (three) times daily with meals. 12/03/17 12/03/18  [provider]  carvedilol (COREG) 25 MG tablet Take 1 tablet (25 mg total) by mouth 2 (two) times daily. 05/01/17 07/01/18  Lisa Roca, MD  clopidogrel (PLAVIX) 75 MG tablet Take 1 tablet (75 mg total) by mouth daily. 11/03/16   Demetrios Loll, MD  furosemide (LASIX) 80 MG tablet Take 80 mg by mouth See admin instructions. Take 1 tablet (80mg ) every morning on non-dialysis days 05/16/18   [provider]  hydrALAZINE (APRESOLINE) 100 MG tablet Take 1 tablet (100 mg total) by mouth 4 (four) times daily. Patient taking differently: Take 100 mg by mouth every 8 (eight)  hours.  05/01/17 07/01/18  Lisa Roca, MD  isosorbide dinitrate (ISORDIL) 30 MG tablet Take 30 mg by mouth 3 (three) times daily.     [provider]  Multiple Vitamin (MULTIVITAMIN) tablet Take 1 tablet by mouth daily. 04/10/18   Henreitta Leber, MD    Allergies Ibuprofen; Viagra [sildenafil]; and Clonidine derivatives  Family History  Problem Relation Age of Onset  . Heart disease Mother   . Hyperlipidemia Mother   . Hypertension Mother   . Kidney disease Father   . Kidney disease Brother   . Hyperlipidemia  Sister   . Hyperlipidemia Daughter   . Hypertension Sister   . Hypertension Daughter     Social History Social History   Tobacco Use  . Smoking status: Current Every Day Smoker    Packs/day: 0.25    Years: 25.00    Pack years: 6.25    Types: Cigarettes  . Smokeless tobacco: Never Used  Substance Use Topics  . Alcohol use: No    Alcohol/week: 0.0 standard drinks    Frequency: Never    Comment: occasional  . Drug use: Not Currently    Types: Marijuana    Comment: daily    Review of Systems  Constitutional: No fever/chills Eyes: No visual changes. ENT: No sore throat. Respiratory: Denies cough Genitourinary: Negative for dysuria. Musculoskeletal: Negative for back pain. Skin: Negative for rash.    ____________________________________________   PHYSICAL EXAM:  VITAL SIGNS: ED Triage Vitals  Enc Vitals Group     BP 07/22/18 1127 (!) 155/109     Pulse Rate 07/22/18 1127 69     Resp 07/22/18 1127 18     Temp 07/22/18 1127 98 F (36.7 C)     Temp Source 07/22/18 1127 Oral     SpO2 07/22/18 1127 95 %     Weight 07/22/18 1124 225 lb (102.1 kg)     Height 07/22/18 1124 6' (1.829 m)     Head Circumference --      Peak Flow --      Pain Score 07/22/18 1124 0     Pain Loc --      Pain Edu? --      Excl. in [place]? --     Constitutional: Alert and oriented. Well appearing and in no acute distress. Eyes: Conjunctivae are normal.  Head: Atraumatic. Nose: No congestion/rhinnorhea. Mouth/Throat: Mucous membranes are moist.   Cardiovascular: Normal rate, regular rhythm. Heart sounds are normal Respiratory: Normal respiratory effort.  No retractions, lungs c t a  GU: deferred Musculoskeletal: FROM all extremities, warm and well perfused Neurologic:  Normal speech and language.  Skin:  Skin is warm, dry and intact. No rash noted. Psychiatric: Mood and affect are normal. Speech and behavior are normal.  ____________________________________________   LABS (all labs  ordered are listed, but only abnormal results are displayed)  Labs Reviewed - No data to display ____________________________________________   ____________________________________________  RADIOLOGY    ____________________________________________   PROCEDURES  Procedure(s) performed: No  Procedures    ____________________________________________   INITIAL IMPRESSION / ASSESSMENT AND PLAN / ED COURSE  Pertinent labs & imaging results that were available during my care of the patient were reviewed by me and considered in my medical decision making (see chart for details).   Patient is 48 year old male presents emergency department in need of dialysis  Physical exam is normal.    Dr. Holley Raring was paged.  He is to get nursing staff to transfer the patient for  dialysis.     As part of my medical decision making, I reviewed the following data within the [place] notes reviewed and incorporated, Old chart reviewed, Notes from prior ED visits and [place] Controlled Substance Database  ____________________________________________   FINAL CLINICAL IMPRESSION(S) / ED DIAGNOSES  Final diagnoses:  Dialysis patient [employer])      NEW MEDICATIONS STARTED DURING THIS VISIT:  New Prescriptions   No medications on file     Note:  This document was prepared using Dragon voice recognition software and may include unintentional dictation errors.    Versie Starks, PA-C 07/22/18 1154    Lavonia Drafts, MD 07/22/18 1300

## 2018-07-22 NOTE — ED Triage Notes (Signed)
Pt is here for dialysis, last was Wednesday at [facility].

## 2018-07-22 NOTE — ED Notes (Signed)
See triage note  Presents requesting dialysis  States last treatment was on weds   Denies any new or diff complaints

## 2018-07-22 NOTE — Progress Notes (Signed)
Pre HD Assessment    07/22/18 1430  Neurological  Level of Consciousness Alert  Orientation Level Oriented X4  Respiratory  Respiratory Pattern Regular  Chest Assessment Chest expansion symmetrical  Bilateral Breath Sounds Diminished  Cough None  Cardiac  Pulse Regular  Heart Sounds S1, S2  ECG Monitor No (EKG in ED, refuses ECG monitor in HD )  Vascular  R Radial Pulse +2  L Radial Pulse +2  Edema Generalized  Integumentary  Integumentary (WDL) X  Skin Color Appropriate for ethnicity  Skin Condition Dry  Musculoskeletal  Musculoskeletal (WDL) WDL  Gastrointestinal  Bowel Sounds Assessment Active  GU Assessment  Genitourinary (WDL) X  Genitourinary Symptoms Other (Comment) (HD pt )  Psychosocial  Psychosocial (WDL) WDL

## 2018-07-22 NOTE — ED Notes (Signed)
Informed pt of wait for treatment    Lunch given

## 2018-07-22 NOTE — ED Notes (Signed)
Take to dialysis vis w/c

## 2018-07-23 NOTE — Progress Notes (Signed)
HD Tx completed, Pt requested to be taken off due to cramping, offered pt saline and to turn off uf, but pt insisted he needed to end tx. Lost 39 min TX time. Pt aware of ramifications for shortening TX. MD Notified.    07/22/18 1741  Vital Signs  BP (!) 162/132  During Hemodialysis Assessment  HD Safety Checks Performed Yes  KECN 64.3 KECN  Dialysis Fluid Bolus Normal Saline  Bolus Amount (mL) 250 mL  Intra-Hemodialysis Comments Tx completed;See progress note

## 2018-07-23 NOTE — Progress Notes (Signed)
Post HD [facility]    07/22/18 1745  Hand-Off documentation  Report given to (Full Name) Halford Decamp, RN   Report received from (Full Name) Beatris Ship, RN   Vital Signs  Temp 98.3 F (36.8 C)  Temp Source Oral  Pulse Rate (!) 103  Pulse Rate Source Monitor  Resp 19  BP (!) 160/95  BP Location Right Arm  BP Method Automatic  Patient Position (if appropriate) Sitting  Oxygen Therapy  SpO2 100 %  O2 Device Room Air  Pulse Oximetry Type Continuous  Pain Assessment  Pain Scale 0-10  Pain Score 0  Post-Hemodialysis Assessment  Rinseback Volume (mL) 250 mL  KECN 58.3 V  Dialyzer Clearance Lightly streaked  Duration of HD Treatment -hour(s) 2.95 hour(s)  Hemodialysis Intake (mL) 500 mL  UF Total -Machine (mL) 2384 mL  Net UF (mL) 1884 mL  Tolerated HD Treatment No (Comment) (Pt ened tx early due to cramping )  Hemodialysis Catheter Right Subclavian Double-lumen  Placement Date/Time: (c) 02/10/17 1343   Placed prior to admission: Yes  Orientation: Right  Access Location: Subclavian  Hemodialysis Catheter Type: Double-lumen  Site Condition No complications  Blue Lumen Status Heparin locked  Red Lumen Status Heparin locked  Post treatment catheter status Capped and Clamped

## 2018-07-23 NOTE — Progress Notes (Signed)
HD Tx completed, see add'l progress notes    07/22/18 1741  Vital Signs  Pulse Rate (!) 101  Pulse Rate Source Monitor  Resp 18  BP (!) 162/132  BP Location Right Arm  BP Method Automatic  Patient Position (if appropriate) Sitting  Oxygen Therapy  SpO2 98 %  O2 Device Room Air  Pulse Oximetry Type Continuous  During Hemodialysis Assessment  HD Safety Checks Performed Yes  KECN 58.3 KECN  Dialysis Fluid Bolus Normal Saline  Bolus Amount (mL) 250 mL  Intra-Hemodialysis Comments Tx completed;See progress note

## 2018-07-23 NOTE — Progress Notes (Signed)
Post HD Assessment    07/22/18 1750  Neurological  Level of Consciousness Alert  Orientation Level Oriented X4  Respiratory  Respiratory Pattern Regular  Chest Assessment Chest expansion symmetrical  Bilateral Breath Sounds Clear;Diminished  Cough None  Cardiac  Pulse Regular  Heart Sounds S1, S2  ECG Monitor No (EKG in ED, refuses ECG monitor in HD )  Vascular  R Radial Pulse +2  L Radial Pulse +2  Edema Generalized  Generalized Edema None  Integumentary  Integumentary (WDL) X  Skin Color Appropriate for ethnicity  Skin Condition Dry  Musculoskeletal  Musculoskeletal (WDL) WDL  Gastrointestinal  Bowel Sounds Assessment Active  GU Assessment  Genitourinary (WDL) X  Genitourinary Symptoms Other (Comment) (HD pt )  Psychosocial  Psychosocial (WDL) WDL

## 2018-07-27 ENCOUNTER — Emergency Department
Admission: EM | Admit: 2018-07-27 | Discharge: 2018-07-27 | Disposition: A | Discharge status: Home / Self Care | Payer: Medicare Other | Attending: Student in an Organized Health Care Education/Training Program | Admitting: Student in an Organized Health Care Education/Training Program

## 2018-07-27 ENCOUNTER — Other Ambulatory Visit: Payer: Self-pay

## 2018-07-27 DIAGNOSIS — I252 Old myocardial infarction: Secondary | ICD-10-CM | POA: Diagnosis not present

## 2018-07-27 DIAGNOSIS — I120 Hypertensive chronic kidney disease with stage 5 chronic kidney disease or end stage renal disease: Secondary | ICD-10-CM | POA: Diagnosis not present

## 2018-07-27 DIAGNOSIS — Z7902 Long term (current) use of antithrombotics/antiplatelets: Secondary | ICD-10-CM | POA: Insufficient documentation

## 2018-07-27 DIAGNOSIS — N186 End stage renal disease: Secondary | ICD-10-CM | POA: Diagnosis not present

## 2018-07-27 DIAGNOSIS — J449 Chronic obstructive pulmonary disease, unspecified: Secondary | ICD-10-CM | POA: Diagnosis not present

## 2018-07-27 DIAGNOSIS — I259 Chronic ischemic heart disease, unspecified: Secondary | ICD-10-CM | POA: Insufficient documentation

## 2018-07-27 DIAGNOSIS — Z4931 Encounter for adequacy testing for hemodialysis: Secondary | ICD-10-CM | POA: Diagnosis present

## 2018-07-27 DIAGNOSIS — F17210 Nicotine dependence, cigarettes, uncomplicated: Secondary | ICD-10-CM | POA: Insufficient documentation

## 2018-07-27 DIAGNOSIS — Z992 Dependence on renal dialysis: Secondary | ICD-10-CM | POA: Insufficient documentation

## 2018-07-27 DIAGNOSIS — Z79899 Other long term (current) drug therapy: Secondary | ICD-10-CM | POA: Diagnosis not present

## 2018-07-27 LAB — CBC
HCT: 29.5 % — ABNORMAL LOW (ref 39.0–52.0)
Hemoglobin: 9.5 g/dL — ABNORMAL LOW (ref 13.0–17.0)
MCH: 31 pg (ref 26.0–34.0)
MCHC: 32.2 g/dL (ref 30.0–36.0)
MCV: 96.4 fL (ref 80.0–100.0)
Platelets: 209 10*3/uL (ref 150–400)
RBC: 3.06 MIL/uL — ABNORMAL LOW (ref 4.22–5.81)
RDW: 15.9 % — ABNORMAL HIGH (ref 11.5–15.5)
WBC: 6.8 10*3/uL (ref 4.0–10.5)
nRBC: 0 % (ref 0.0–0.2)

## 2018-07-27 LAB — RENAL FUNCTION PANEL
Albumin: 3.6 g/dL (ref 3.5–5.0)
Anion gap: 16 — ABNORMAL HIGH (ref 5–15)
BUN: 62 mg/dL — ABNORMAL HIGH (ref 6–20)
CO2: 21 mmol/L — ABNORMAL LOW (ref 22–32)
Calcium: 8.9 mg/dL (ref 8.9–10.3)
Chloride: 101 mmol/L (ref 98–111)
Creatinine, Ser: 13.88 mg/dL — ABNORMAL HIGH (ref 0.61–1.24)
GFR calc Af Amer: 4 mL/min — ABNORMAL LOW (ref >60)
GFR calc non Af Amer: 4 mL/min — ABNORMAL LOW (ref >60)
Glucose, Bld: 92 mg/dL (ref 70–99)
Phosphorus: 6.9 mg/dL — ABNORMAL HIGH (ref 2.5–4.6)
Potassium: 4.8 mmol/L (ref 3.5–5.1)
Sodium: 138 mmol/L (ref 135–145)

## 2018-07-27 MED ORDER — GENERIC EXTERNAL MEDICATION
2.20 | Status: DC
Start: ? — End: 2018-07-27

## 2018-07-27 MED ORDER — CINACALCET HCL 30 MG PO TABS
30.00 | ORAL_TABLET | ORAL | Status: DC
Start: 2018-07-26 — End: 2018-07-27

## 2018-07-27 MED ORDER — GENERIC EXTERNAL MEDICATION
2.40 | Status: DC
Start: ? — End: 2018-07-27

## 2018-07-27 MED ORDER — CHLORHEXIDINE GLUCONATE CLOTH 2 % EX PADS
6.0000 | MEDICATED_PAD | Freq: Every day | CUTANEOUS | Status: DC
  Filled 2018-07-27: qty 6

## 2018-07-27 NOTE — Progress Notes (Signed)
Pre HD Tx     07/27/18 1215  Hand-Off documentation  Report given to (Full Name) Trellis Paganini RN  Report received from (Full Name) Jeanett Schlein RN ED  Vital Signs  Temp 98.6 F (37 C)  Temp Source Oral  Pulse Rate 84  Pulse Rate Source Monitor  Resp 16  BP (!) 194/116  BP Location Right Arm  BP Method Automatic  Patient Position (if appropriate) Lying  Oxygen Therapy  SpO2 100 %  O2 Device Room Air  Pain Assessment  Pain Scale 0-10  Pain Score 0  Dialysis Weight  Weight 94.2 kg  Type of Weight Pre-Dialysis  Time-Out for Hemodialysis  What Procedure? Hemodialysis  Pt Identifiers(min of two) First/Last Name;MRN/Account#  Correct Site? Yes  Correct Side? Yes  Correct Procedure? Yes  Consents Verified? Yes  Rad Studies Available? N/A  Safety Precautions Reviewed? Yes  Engineer, civil (consulting) Number 7  Station Number 3  UF/Alarm Test Passed  Conductivity: Meter 14  Conductivity: Machine  14  pH 7.4  Reverse Osmosis Main  Normal Saline Lot Number [id number]  Dialyzer Lot Number [id number]  Disposable Set Lot Number [id number]  Machine Temperature 98.6 F (37 C)  Musician and Audible Yes  Blood Lines Intact and Secured Yes  Pre Treatment Patient Checks  Vascular access used during treatment Catheter  Patient is receiving dialysis in a chair Yes  Hepatitis B Surface Antigen Results Negative  Date Hepatitis B Surface Antigen Drawn 07/04/18  Hepatitis B Surface Antibody  (<10)  Date Hepatitis B Surface Antibody Drawn 07/04/18  Hemodialysis Consent Verified Yes  Hemodialysis Standing Orders Initiated Yes  ECG (Telemetry) Monitor On Yes  Prime Ordered Normal Saline  Length of  DialysisTreatment -hour(s) 3.5 Hour(s)  Dialysis Treatment Comments Na 140  Dialyzer Elisio 17H NR  Dialysate 2K, 2.5 Ca  Dialysate Flow Ordered 600  Blood Flow Rate Ordered 400 mL/min  Ultrafiltration Goal 1 Liters  Pre Treatment Labs CBC;Renal panel  Dialysis Blood Pressure Support  Ordered Normal Saline  Education / Care Plan  Dialysis Education Provided Yes  Documented Education in Care Plan Yes  Hemodialysis Catheter Right Subclavian Double-lumen  Placement Date/Time: (c) 02/10/17 1343   Placed prior to admission: Yes  Orientation: Right  Access Location: Subclavian  Hemodialysis Catheter Type: Double-lumen  Site Condition No complications  Blue Lumen Status Capped (Central line)  Red Lumen Status Capped (Central line)  Dressing Type Biopatch;Occlusive  Dressing Status Clean;Dry;Intact  Drainage Description None  Dressing Change Due 08/01/18

## 2018-07-27 NOTE — ED Triage Notes (Signed)
Pt is here for routine dialysis, pt goes between here and [facility] 3 days a week

## 2018-07-27 NOTE — Progress Notes (Signed)
Pre HD Assessment  Pt refuses the ordered 3L fluid pull, as pt reported "I am not pulling more than one, I cramped bad last time in my leg". Dr. Juleen China aware of this, will set target for 1 liter, pt educated and acknowledges the risks of fluid overload.    07/27/18 1220  Neurological  Level of Consciousness Alert  Orientation Level Oriented X4  Respiratory  Respiratory Pattern Dyspnea with exertion  Chest Assessment Chest expansion symmetrical  Bilateral Breath Sounds Diminished  Cardiac  Pulse Regular  Heart Sounds S1, S2  ECG Monitor No (pt refused continuous monitor - only allows intermittant)  Cardiac Rhythm NSR  Ectopy Multifocal PVC's  Ectopy Frequency Occasional  Vascular  R Radial Pulse +2  L Radial Pulse +2  Edema Generalized  Generalized Edema Other (Comment) (non-pitting)  Integumentary  Integumentary (WDL) X  Skin Color Appropriate for ethnicity  Skin Condition Dry  Additional Integumentary Comments HD pt chronic  Musculoskeletal  Musculoskeletal (WDL) WDL  Gastrointestinal  Bowel Sounds Assessment Active  GU Assessment  Genitourinary (WDL) X  Genitourinary Symptoms Other (Comment);Oliguria (HD pt )  Psychosocial  Psychosocial (WDL) WDL

## 2018-07-27 NOTE — ED Notes (Signed)
Reports sob usually gets dialyzed M/W/F. States he comes to ED when weather does not permit him to drive himself to [facility]. Awaiting Md eval and plan of care.

## 2018-07-27 NOTE — Progress Notes (Signed)
  [facility]  ROUNDING NOTE   Subjective:   Fernando Salas presents to the ED with shortness of breath. Last hemodialysis was last week, 5/22, Friday  Objective:  Vital signs in last 24 hours:  Temp:  [98.7 F (37.1 C)] 98.7 F (37.1 C) (05/27 1114) Pulse Rate:  [83] 83 (05/27 1114) Resp:  [17] 17 (05/27 1114) BP: (227)/(124) 227/124 (05/27 1114) SpO2:  [100 %] 100 % (05/27 1114) Weight:  [94.2 kg] 94.2 kg (05/27 1115)  Weight change:  Filed Weights   07/27/18 1115  Weight: 94.2 kg    Intake/Output: No intake/output data recorded.   Intake/Output this shift:  No intake/output data recorded.  Physical Exam: General: NAD,   Head: Normocephalic, atraumatic. Moist oral mucosal membranes  Eyes: Anicteric, PERRL  Neck: Supple, trachea midline  Lungs:  Clear to auscultation  Heart: Regular rate and rhythm  Abdomen:  Soft, nontender  Extremities:  ++ peripheral edema.  Neurologic: Nonfocal, moving all four extremities  Skin: No lesions  Access: RIJ permcath    Basic Metabolic Panel: No results for input(s): NA, K, CL, CO2, GLUCOSE, BUN, CREATININE, CALCIUM, MG, PHOS in the last 168 hours.  Liver Function Tests: No results for input(s): AST, ALT, ALKPHOS, BILITOT, PROT, ALBUMIN in the last 168 hours. No results for input(s): LIPASE, AMYLASE in the last 168 hours. No results for input(s): AMMONIA in the last 168 hours.  CBC: No results for input(s): WBC, NEUTROABS, HGB, HCT, MCV, PLT in the last 168 hours.  Cardiac Enzymes: No results for input(s): CKTOTAL, CKMB, CKMBINDEX, TROPONINI in the last 168 hours.  BNP: Invalid input(s): POCBNP  CBG: No results for input(s): GLUCAP in the last 168 hours.  Microbiology: Results for orders placed or performed during the hospital encounter of 04/08/18  MRSA PCR Screening     Status: None   Collection Time: 04/09/18  4:03 PM  Result Value Ref Range Status   MRSA by PCR NEGATIVE NEGATIVE Final    Comment:         The GeneXpert MRSA Assay (FDA approved for NASAL specimens only), is one component of a comprehensive MRSA colonization surveillance program. It is not intended to diagnose MRSA infection nor to guide or monitor treatment for MRSA infections. Performed at [facility], [address] [place], [place] [address]     Coagulation Studies: No results for input(s): LABPROT, INR in the last 72 hours.  Urinalysis: No results for input(s): COLORURINE, LABSPEC, PHURINE, GLUCOSEU, HGBUR, BILIRUBINUR, KETONESUR, PROTEINUR, UROBILINOGEN, NITRITE, LEUKOCYTESUR in the last 72 hours.  Invalid input(s): APPERANCEUR    Imaging: No results found.   Medications:    . Chlorhexidine Gluconate Cloth  6 each Topical Q0600     Assessment/ Plan:  Fernando Salas is a 48 y.o. black male with end stage renal disease on hemodialysis, hypertension, CVA, sleep apnea, coronary artery disease, COPD, anti-social personality disorder.  No assigned dialysis unit available  1. Shortness of breath 2. End Stage Renal Disease 3. Anemia of chronic kidney disease 4. Secondary hyperparathyroidism 5. Hypertension  Will do hemodialysis treatment today. 2K bath. UF goal of 2-3 liters. Orders prepared.    LOS: 0 Keval Nam 5/27/202012:58 PM

## 2018-07-27 NOTE — ED Notes (Signed)
dialisis nurse called ready for patient when someone can bring him up. Requested that patient bring a lunch tray up with him .

## 2018-07-27 NOTE — Progress Notes (Signed)
HD Tx Start   07/27/18 1222  Vital Signs  Pulse Rate 85  Pulse Rate Source Monitor  Resp 18  BP (!) 191/111  BP Location Right Arm  BP Method Automatic  Patient Position (if appropriate) Lying  Oxygen Therapy  SpO2 100 %  O2 Device Room Air  During Hemodialysis Assessment  Blood Flow Rate (mL/min) 400 mL/min  Arterial Pressure (mmHg) -160 mmHg  Venous Pressure (mmHg) 180 mmHg  Transmembrane Pressure (mmHg) 50 mmHg  Ultrafiltration Rate (mL/min) 500 mL/min  Dialysate Flow Rate (mL/min) 600 ml/min  Conductivity: Machine  14  HD Safety Checks Performed Yes  Dialysis Fluid Bolus Normal Saline  Bolus Amount (mL) 250 mL  Intra-Hemodialysis Comments Tx initiated  Hemodialysis Catheter Right Subclavian Double-lumen  Placement Date/Time: (c) 02/10/17 1343   Placed prior to admission: Yes  Orientation: Right  Access Location: Subclavian  Hemodialysis Catheter Type: Double-lumen  Site Condition No complications  Blue Lumen Status Infusing  Red Lumen Status Infusing

## 2018-07-27 NOTE — Progress Notes (Signed)
HD Tx End / Post HD Tx  1 liter removal. BP remains elevated, pt states he will take meds at home. Pt stable otherwise, tolerated tx well.    07/27/18 1545  Hand-Off documentation  Report given to (Full Name) Jeanett Schlein RN   Report received from (Full Name) Charge ED RN  Vital Signs  Temp 98.6 F (37 C)  Temp Source Oral  Pulse Rate 85  Pulse Rate Source Monitor  Resp 16  BP (!) 190/108  BP Location Right Arm  BP Method Automatic  Patient Position (if appropriate) Lying  Oxygen Therapy  SpO2 96 %  O2 Device Room Air  Pain Assessment  Pain Scale 0-10  Pain Score 0  Dialysis Weight  Weight 93.2 kg  Type of Weight Post-Dialysis  During Hemodialysis Assessment  Blood Flow Rate (mL/min) 200 mL/min  Arterial Pressure (mmHg) -180 mmHg  Venous Pressure (mmHg) 180 mmHg  Transmembrane Pressure (mmHg) 60 mmHg  Ultrafiltration Rate (mL/min) 500 mL/min  Dialysate Flow Rate (mL/min) 600 ml/min  Conductivity: Machine  14  HD Safety Checks Performed Yes  Dialysis Fluid Bolus Normal Saline  Bolus Amount (mL) 250 mL  Intra-Hemodialysis Comments Tx completed;Tolerated well  Post-Hemodialysis Assessment  Rinseback Volume (mL) 250 mL  Dialyzer Clearance Lightly streaked  Duration of HD Treatment -hour(s) 3.5 hour(s)  Hemodialysis Intake (mL) 500 mL  UF Total -Machine (mL) 1500 mL  Net UF (mL) 1000 mL  Tolerated HD Treatment Yes  Hemodialysis Catheter Right Subclavian Double-lumen  Placement Date/Time: (c) 02/10/17 1343   Placed prior to admission: Yes  Orientation: Right  Access Location: Subclavian  Hemodialysis Catheter Type: Double-lumen  Site Condition No complications  Blue Lumen Status Flushed;Capped (Central line);Heparin locked  Red Lumen Status Flushed;Capped (Central line);Heparin locked  Purple Lumen Status N/A  Catheter fill solution Heparin 1000 units/ml  Catheter fill volume (Arterial) 2.4 cc  Catheter fill volume (Venous) 2.4  Dressing Type Biopatch;Occlusive   Dressing Status Clean;Dry;Intact  Drainage Description None  Dressing Change Due 08/01/18  Post treatment catheter status Capped and Clamped

## 2018-07-27 NOTE — ED Provider Notes (Signed)
[facility] Emergency Department Provider Note  ____________________________________________  Time seen: Approximately 11:57 AM  I have reviewed the triage vital signs and the nursing notes.   HISTORY  Chief Complaint Dialysis   HPI Fernando Salas is a 48 y.o. male who presents to the emergency department for  Dialysis.  No new medical complaints.  Past Medical History:  Diagnosis Date  . CAD (coronary artery disease)    status post multiple myocardial infarctions  . COPD (chronic obstructive pulmonary disease) ([place])   . ESRD (end stage renal disease) ([place])   . Hyperlipidemia   . Hypertension   . Myocardial infarction ([place])    " I had 13 heart attacks, 8 massive"  . Proteinuria   . Renal insufficiency   . Secondary hyperparathyroidism of renal origin ([place])   . Shortness of breath dyspnea    " when I drink a cup of cold water too fast"  . Sleep apnea    pt stated " long story" does not wear CPAP  . Stroke [facility])     Patient Active Problem List   Diagnosis Date Noted  . ESRD (end stage renal disease) on dialysis ([place]) 06/27/2018  . Hypertensive urgency, malignant 04/08/2018  . Hypertensive urgency 03/25/2018  . Pulmonary edema 03/09/2017  . 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 Fluid overload 02/08/2017  . Accelerated hypertension 01/12/2017  . AKI (acute kidney injury) ([place])   . Unstable angina ([place]) 11/01/2016    Past Surgical History:  Procedure Laterality Date  . AV FISTULA PLACEMENT Left 01-24-14   By Dr. Hortencia Pilar in [place]  . AV FISTULA PLACEMENT Right 04/30/2014   Procedure: RIGHT BRACHIOCEPHALIC ARTERIOVENOUS (AV) FISTULA CREATION;  Surgeon: Elam Dutch, MD;  Location: [facility] OR;  Service: Vascular;  Laterality: Right;  . AV FISTULA PLACEMENT Left 05/31/2014   Procedure: Left INSERTION OF ARTERIOVENOUS (AV) GORE-TEX GRAFT ARM;  Surgeon: Elam Dutch, MD;  Location: [place];  Service: Vascular;  Laterality: Left;  .  CARDIAC CATHETERIZATION    . CORONARY STENT PLACEMENT     13 stents total  . DIALYSIS/PERMA CATHETER INSERTION N/A 01/15/2017   Procedure: DIALYSIS/PERMA CATHETER INSERTION;  Surgeon: Katha Cabal, MD;  Location: [place] CV LAB;  Service: Cardiovascular;  Laterality: N/A;    Prior to Admission medications   Medication Sig Start Date End Date Taking? Authorizing Provider  amLODipine (NORVASC) 10 MG tablet Take 1 tablet (10 mg total) by mouth daily. 05/01/17 07/01/18  Lisa Roca, MD  atorvastatin (LIPITOR) 40 MG tablet Take 1 tablet (40 mg total) by mouth daily. 11/03/16   Demetrios Loll, MD  calcium acetate (PHOSLO) 667 MG capsule Take 1,334 mg by mouth 3 (three) times daily with meals. 12/03/17 12/03/18  [provider]  carvedilol (COREG) 25 MG tablet Take 1 tablet (25 mg total) by mouth 2 (two) times daily. 05/01/17 07/01/18  Lisa Roca, MD  clopidogrel (PLAVIX) 75 MG tablet Take 1 tablet (75 mg total) by mouth daily. 11/03/16   Demetrios Loll, MD  furosemide (LASIX) 80 MG tablet Take 80 mg by mouth See admin instructions. Take 1 tablet (80mg ) every morning on non-dialysis days 05/16/18   [provider]  hydrALAZINE (APRESOLINE) 100 MG tablet Take 1 tablet (100 mg total) by mouth 4 (four) times daily. Patient taking differently: Take 100 mg by mouth every 8 (eight) hours.  05/01/17 07/01/18  Lisa Roca, MD  isosorbide dinitrate (ISORDIL) 30 MG tablet Take 30 mg by mouth 3 (  three) times daily.     [provider]  Multiple Vitamin (MULTIVITAMIN) tablet Take 1 tablet by mouth daily. 04/10/18   Henreitta Leber, MD    Allergies Ibuprofen; Viagra [sildenafil]; and Clonidine derivatives  Family History  Problem Relation Age of Onset  . Heart disease Mother   . Hyperlipidemia Mother   . Hypertension Mother   . Kidney disease Father   . Kidney disease Brother   . Hyperlipidemia Sister   . Hyperlipidemia Daughter   . Hypertension Sister   . Hypertension Daughter      Social History Social History   Tobacco Use  . Smoking status: Current Every Day Smoker    Packs/day: 0.25    Years: 25.00    Pack years: 6.25    Types: Cigarettes  . Smokeless tobacco: Never Used  Substance Use Topics  . Alcohol use: No    Alcohol/week: 0.0 standard drinks    Frequency: Never    Comment: occasional  . Drug use: Not Currently    Types: Marijuana    Comment: daily    Review of Systems Constitutional: Negative for fever. ENT: Negative for sore throat. Respiratory: Positive for mild shortness of breath. Gastrointestinal: No abdominal pain.  No nausea, no vomiting.  No diarrhea.  Musculoskeletal: Negative for generalized body aches. Skin: Negative for rash/lesion/wound. Neurological: Negative for headaches, focal weakness or numbness.  ____________________________________________   PHYSICAL EXAM:  VITAL SIGNS: ED Triage Vitals  Enc Vitals Group     BP 07/27/18 1114 (!) 227/124     Pulse Rate 07/27/18 1114 83     Resp 07/27/18 1114 17     Temp 07/27/18 1114 98.7 F (37.1 C)     Temp Source 07/27/18 1114 Oral     SpO2 07/27/18 1114 100 %     Weight 07/27/18 1115 207 lb 11.2 oz (94.2 kg)     Height 07/27/18 1115 6' (1.829 m)     Head Circumference --      Peak Flow --      Pain Score 07/27/18 1115 0     Pain Loc --      Pain Edu? --      Excl. in [place]? --     Constitutional: Alert and oriented. Well appearing and in no acute distress. Eyes: Conjunctivae are normal. PERRL. EOMI. Head: Atraumatic. Nose: No congestion/rhinnorhea. Mouth/Throat: Mucous membranes are moist. Neck: No stridor.  Cardiovascular: Normal rate, regular rhythm. Good peripheral circulation. Respiratory: Normal respiratory effort. Musculoskeletal: Full ROM throughout.  Neurologic:  Normal speech and language. No gross focal neurologic deficits are appreciated. Speech is normal. No gait instability. Skin:  Skin is warm, dry and intact. No rash noted. Psychiatric: Mood and  affect are normal. Speech and behavior are normal.  ____________________________________________   LABS (all labs ordered are listed, but only abnormal results are displayed)  Labs Reviewed  RENAL FUNCTION PANEL - Abnormal; Notable for the following components:      Result Value   CO2 21 (*)    BUN 62 (*)    Creatinine, Ser 13.88 (*)    Phosphorus 6.9 (*)    GFR calc non Af Amer 4 (*)    GFR calc Af Amer 4 (*)    Anion gap 16 (*)    All other components within normal limits  CBC - Abnormal; Notable for the following components:   RBC 3.06 (*)    Hemoglobin 9.5 (*)    HCT 29.5 (*)    RDW  15.9 (*)    All other components within normal limits   ____________________________________________  EKG  Not indicated ____________________________________________  RADIOLOGY  Not indicated ____________________________________________   PROCEDURES  None ____________________________________________   INITIAL IMPRESSION / ASSESSMENT AND PLAN / ED COURSE   48 year old male presents to the emergency department for dialysis.  Dr. Juleen China aware of the patient.  He came to the emergency department and placed orders for dialysis. Patient now in dialysis. Will discharge him home afterwards.  ----------------------------------------- 3:42 PM on 07/27/2018 -----------------------------------------  Laban Emperor, PA-C will discharge patient upon arrival back to ER.  Pertinent labs & imaging results that were available during my care of the patient were reviewed by me and considered in my medical decision making (see chart for details).  ___________________________________________   FINAL CLINICAL IMPRESSION(S) / ED DIAGNOSES  Final diagnoses:  ESRD (end stage renal disease) ([place])       Victorino Dike, FNP 07/27/18 1543    Merlyn Lot, MD 07/31/18 [phone]

## 2018-07-27 NOTE — Progress Notes (Signed)
Post HD Tx Assessment   07/27/18 1555  Neurological  Level of Consciousness Alert  Orientation Level Oriented X4  Respiratory  Respiratory Pattern Regular  Chest Assessment Chest expansion symmetrical  Bilateral Breath Sounds Diminished  Cardiac  Pulse Regular  Heart Sounds S1, S2  ECG Monitor No (pt refused continuous monitor - only allows intermittant)  Cardiac Rhythm NSR  Ectopy Multifocal PVC's  Ectopy Frequency Occasional  Vascular  R Radial Pulse +2  L Radial Pulse +2  Edema Generalized  Generalized Edema Other (Comment) (non-pitting)  Integumentary  Integumentary (WDL) X  Skin Color Appropriate for ethnicity  Skin Condition Dry  Musculoskeletal  Musculoskeletal (WDL) WDL  Gastrointestinal  Bowel Sounds Assessment Active  GU Assessment  Genitourinary (WDL) X  Genitourinary Symptoms Other (Comment);Oliguria (HD pt )  Psychosocial  Psychosocial (WDL) WDL

## 2018-08-01 ENCOUNTER — Emergency Department
Admission: EM | Admit: 2018-08-01 | Discharge: 2018-08-01 | Disposition: A | Discharge status: Home / Self Care | Payer: Medicare Other | Attending: Emergency Medicine | Admitting: Emergency Medicine

## 2018-08-01 ENCOUNTER — Encounter: Payer: Self-pay | Admitting: Emergency Medicine

## 2018-08-01 ENCOUNTER — Other Ambulatory Visit: Payer: Self-pay

## 2018-08-01 ENCOUNTER — Emergency Department: Payer: Medicare Other

## 2018-08-01 DIAGNOSIS — M545 Low back pain, unspecified: Secondary | ICD-10-CM

## 2018-08-01 DIAGNOSIS — F17210 Nicotine dependence, cigarettes, uncomplicated: Secondary | ICD-10-CM | POA: Insufficient documentation

## 2018-08-01 DIAGNOSIS — Z955 Presence of coronary angioplasty implant and graft: Secondary | ICD-10-CM | POA: Insufficient documentation

## 2018-08-01 DIAGNOSIS — N186 End stage renal disease: Secondary | ICD-10-CM | POA: Insufficient documentation

## 2018-08-01 DIAGNOSIS — Z992 Dependence on renal dialysis: Secondary | ICD-10-CM | POA: Insufficient documentation

## 2018-08-01 DIAGNOSIS — I120 Hypertensive chronic kidney disease with stage 5 chronic kidney disease or end stage renal disease: Secondary | ICD-10-CM | POA: Diagnosis not present

## 2018-08-01 DIAGNOSIS — J449 Chronic obstructive pulmonary disease, unspecified: Secondary | ICD-10-CM | POA: Diagnosis not present

## 2018-08-01 DIAGNOSIS — I2510 Atherosclerotic heart disease of native coronary artery without angina pectoris: Secondary | ICD-10-CM | POA: Diagnosis not present

## 2018-08-01 LAB — CBC WITH DIFFERENTIAL/PLATELET
Abs Immature Granulocytes: 0.02 10*3/uL (ref 0.00–0.07)
Basophils Absolute: 0 10*3/uL (ref 0.0–0.1)
Basophils Relative: 0 %
Eosinophils Absolute: 0 10*3/uL (ref 0.0–0.5)
Eosinophils Relative: 0 %
HCT: 30 % — ABNORMAL LOW (ref 39.0–52.0)
Hemoglobin: 9.9 g/dL — ABNORMAL LOW (ref 13.0–17.0)
Immature Granulocytes: 0 %
Lymphocytes Relative: 16 %
Lymphs Abs: 1.1 10*3/uL (ref 0.7–4.0)
MCH: 31.6 pg (ref 26.0–34.0)
MCHC: 33 g/dL (ref 30.0–36.0)
MCV: 95.8 fL (ref 80.0–100.0)
Monocytes Absolute: 0.7 10*3/uL (ref 0.1–1.0)
Monocytes Relative: 9 %
Neutro Abs: 5.4 10*3/uL (ref 1.7–7.7)
Neutrophils Relative %: 75 %
Platelets: 203 10*3/uL (ref 150–400)
RBC: 3.13 MIL/uL — ABNORMAL LOW (ref 4.22–5.81)
RDW: 17.4 % — ABNORMAL HIGH (ref 11.5–15.5)
WBC: 7.2 10*3/uL (ref 4.0–10.5)
nRBC: 0.4 % — ABNORMAL HIGH (ref 0.0–0.2)

## 2018-08-01 LAB — COMPREHENSIVE METABOLIC PANEL
ALT: 11 U/L (ref 0–44)
AST: 14 U/L — ABNORMAL LOW (ref 15–41)
Albumin: 3.4 g/dL — ABNORMAL LOW (ref 3.5–5.0)
Alkaline Phosphatase: 60 U/L (ref 38–126)
Anion gap: 16 — ABNORMAL HIGH (ref 5–15)
BUN: 61 mg/dL — ABNORMAL HIGH (ref 6–20)
CO2: 22 mmol/L (ref 22–32)
Calcium: 8.5 mg/dL — ABNORMAL LOW (ref 8.9–10.3)
Chloride: 102 mmol/L (ref 98–111)
Creatinine, Ser: 11.68 mg/dL — ABNORMAL HIGH (ref 0.61–1.24)
GFR calc Af Amer: 5 mL/min — ABNORMAL LOW (ref >60)
GFR calc non Af Amer: 5 mL/min — ABNORMAL LOW (ref >60)
Glucose, Bld: 123 mg/dL — ABNORMAL HIGH (ref 70–99)
Potassium: 3.9 mmol/L (ref 3.5–5.1)
Sodium: 140 mmol/L (ref 135–145)
Total Bilirubin: 1.2 mg/dL (ref 0.3–1.2)
Total Protein: 6.9 g/dL (ref 6.5–8.1)

## 2018-08-01 MED ORDER — CHLORHEXIDINE GLUCONATE CLOTH 2 % EX PADS: 6.0000 | MEDICATED_PAD | Freq: Every day | CUTANEOUS | Status: DC

## 2018-08-01 MED ORDER — HEPARIN SODIUM (PORCINE) 1000 UNIT/ML DIALYSIS
20.0000 [IU]/kg | INTRAMUSCULAR | Status: DC | PRN
  Filled 2018-08-01: qty 2

## 2018-08-01 MED ORDER — DIAZEPAM 5 MG PO TABS
10.0000 mg | ORAL_TABLET | Freq: Once | ORAL | Status: AC
  Administered 2018-08-01: 10 mg via ORAL
  Filled 2018-08-01 (×2): qty 2

## 2018-08-01 MED ORDER — OXYCODONE-ACETAMINOPHEN 5-325 MG PO TABS
1.0000 | ORAL_TABLET | Freq: Once | ORAL | Status: DC
  Filled 2018-08-01: qty 1

## 2018-08-01 NOTE — ED Notes (Signed)
Pt requesting food at this time. MD states pt to remain NPO until we know pt can be dialyzed

## 2018-08-01 NOTE — Progress Notes (Addendum)
Pre HD Tx    08/01/18 1450  Hand-Off documentation  Report given to (Full Name) Trellis Paganini RN  Report received from (Full Name) Alvira Monday RN  Vital Signs  Temp 98 F (36.7 C)  Temp Source Oral  Pulse Rate 85  Pulse Rate Source Monitor  Resp (!) 22  BP (!) 198/124  BP Location Right Arm  BP Method Automatic  Patient Position (if appropriate) Sitting  Oxygen Therapy  SpO2 100 %  O2 Device Room Air  Pain Assessment  Pain Scale 0-10  Pain Score 10  Pain Type Acute pain  Pain Location Back  Pain Orientation Lower  Pain Radiating Towards  ("goes all across my lower back")  Pain Descriptors / Indicators Spasm;Aching  Pain Frequency Constant  Pain Onset On-going  Pain Intervention(s) Repositioned;RN made aware;Medication (See eMAR)  Dialysis Weight  Weight  (91.7kg according to dialysis scale (10kg less than ED scale))  Type of Weight Pre-Dialysis  Time-Out for Hemodialysis  What Procedure? Hemodialysis   Pt Identifiers(min of two) First/Last Name;MRN/Account#  Correct Site? Yes  Correct Side? Yes  Correct Procedure? Yes  Consents Verified? Yes  Rad Studies Available? N/A  Safety Precautions Reviewed? Yes  Engineer, civil (consulting) Number 2  Station Number 3  UF/Alarm Test Passed  Conductivity: Meter 14  Conductivity: Machine  14  pH 7.2  Reverse Osmosis Main  Normal Saline Lot Number [id number]  Dialyzer Lot Number [id number]  Disposable Set Lot Number [id number]  Machine Temperature 98.6 F (37 C)  Musician and Audible Yes  Blood Lines Intact and Secured Yes  Pre Treatment Patient Checks  Vascular access used during treatment Catheter  Patient is receiving dialysis in a chair Yes  Hepatitis B Surface Antigen Results Negative  Date Hepatitis B Surface Antigen Drawn 07/04/18  Hepatitis B Surface Antibody  (<10)  Date Hepatitis B Surface Antibody Drawn 07/04/18  Hemodialysis Consent Verified Yes  Hemodialysis Standing Orders Initiated Yes  ECG  (Telemetry) Monitor On Yes  Prime Ordered Normal Saline  Length of  DialysisTreatment -hour(s) 3 Hour(s)  Dialysis Treatment Comments Na 140  Dialyzer Elisio 17H NR  Dialysate 3K, 2.5 Ca  Dialysate Flow Ordered 800  Blood Flow Rate Ordered 400 mL/min  Ultrafiltration Goal 1 Liters  Pre Treatment Labs Renal panel;CBC;Hepatitis B Surface Antigen  Dialysis Blood Pressure Support Ordered Normal Saline  Education / Care Plan  Dialysis Education Provided Yes  Documented Education in Care Plan Yes  Hemodialysis Catheter Right Subclavian Double-lumen  Placement Date/Time: (c) 02/10/17 1343   Placed prior to admission: Yes  Orientation: Right  Access Location: Subclavian  Hemodialysis Catheter Type: Double-lumen  Site Condition No complications  Blue Lumen Status Capped (Central line)  Red Lumen Status Capped (Central line)  Purple Lumen Status N/A  Dressing Type Biopatch;Occlusive  Dressing Status Clean;Dry;Intact  Interventions Other (Comment) (pt refused dressing change)  Drainage Description None

## 2018-08-01 NOTE — ED Notes (Signed)
Dialysis contacted and states they are ready for pt to be dialyzed at this time. Pt being transported to dialysis at this time

## 2018-08-01 NOTE — ED Notes (Signed)
Pt refused to take medications. Pt states "tell the doctor to come talk to me".  MD notified of pt refusal and desire to speak to doctor

## 2018-08-01 NOTE — ED Provider Notes (Signed)
[facility] Emergency Department Provider Note       Time seen: ----------------------------------------- 12:59 PM on 08/01/2018 -----------------------------------------   I have reviewed the triage vital signs and the nursing notes.  HISTORY   Chief Complaint Back Pain and dialysis   HPI Fernando Salas is a 48 y.o. male with a history of coronary artery disease, COPD, end-stage renal disease, hyperlipidemia, hypertension, MI, who presents to the ED for low back pain that began yesterday evening.  Patient denies any radiation to either leg.  Patient states he was cooking out for his birthday with sudden onset of right low back pain.  Past Medical History:  Diagnosis Date  . CAD (coronary artery disease)    status post multiple myocardial infarctions  . COPD (chronic obstructive pulmonary disease) ([place])   . ESRD (end stage renal disease) ([place])   . Hyperlipidemia   . Hypertension   . Myocardial infarction ([place])    " I had 13 heart attacks, 8 massive"  . Proteinuria   . Renal insufficiency   . Secondary hyperparathyroidism of renal origin ([place])   . Shortness of breath dyspnea    " when I drink a cup of cold water too fast"  . Sleep apnea    pt stated " long story" does not wear CPAP  . Stroke [facility])     Patient Active Problem List   Diagnosis Date Noted  . ESRD (end stage renal disease) on dialysis ([place]) 06/27/2018  . Hypertensive urgency, malignant 04/08/2018  . Hypertensive urgency 03/25/2018  . Pulmonary edema 03/09/2017  . 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 Fluid overload 02/08/2017  . Accelerated hypertension 01/12/2017  . AKI (acute kidney injury) ([place])   . Unstable angina ([place]) 11/01/2016    Past Surgical History:  Procedure Laterality Date  . AV FISTULA PLACEMENT Left 01-24-14   By Dr. Hortencia Pilar in [place]  . AV FISTULA PLACEMENT Right 04/30/2014   Procedure: RIGHT BRACHIOCEPHALIC ARTERIOVENOUS (AV) FISTULA CREATION;   Surgeon: Elam Dutch, MD;  [facility];  Service: Vascular;  Laterality: Right;  . AV FISTULA PLACEMENT Left 05/31/2014   Procedure: Left INSERTION OF ARTERIOVENOUS (AV) GORE-TEX GRAFT ARM;  Surgeon: Elam Dutch, MD;  Location: [place];  Service: Vascular;  Laterality: Left;  . CARDIAC CATHETERIZATION    . CORONARY STENT PLACEMENT     13 stents total  . DIALYSIS/PERMA CATHETER INSERTION N/A 01/15/2017   Procedure: DIALYSIS/PERMA CATHETER INSERTION;  Surgeon: Katha Cabal, MD;  Location: [place] CV LAB;  Service: Cardiovascular;  Laterality: N/A;    Allergies Ibuprofen; Viagra [sildenafil]; and Clonidine derivatives  Social History Social History   Tobacco Use  . Smoking status: Current Every Day Smoker    Packs/day: 0.25    Years: 25.00    Pack years: 6.25    Types: Cigarettes  . Smokeless tobacco: Never Used  Substance Use Topics  . Alcohol use: No    Alcohol/week: 0.0 standard drinks    Frequency: Never    Comment: occasional  . Drug use: Not Currently    Types: Marijuana    Comment: daily   Review of Systems Constitutional: Negative for fever. Cardiovascular: Negative for chest pain. Respiratory: Negative for shortness of breath. Gastrointestinal: Negative for abdominal pain, vomiting and diarrhea. Musculoskeletal: Positive for back pain Skin: Negative for rash. Neurological: Negative for headaches, focal weakness or numbness.  All systems negative/normal/unremarkable except as stated in the HPI  ____________________________________________   PHYSICAL EXAM:  VITAL  SIGNS: ED Triage Vitals  Enc Vitals Group     BP 08/01/18 1115 (!) 178/134     Pulse Rate 08/01/18 1115 81     Resp 08/01/18 1115 18     Temp 08/01/18 1115 97.8 F (36.6 C)     Temp Source 08/01/18 1115 Oral     SpO2 08/01/18 1115 96 %     Weight 08/01/18 1117 225 lb (102.1 kg)     Height 08/01/18 1116 6' (1.829 m)     Head Circumference --      Peak Flow --      Pain  Score 08/01/18 1116 8     Pain Loc --      Pain Edu? --      Excl. in [place]? --    Constitutional: Alert and oriented.  Mild distress from pain Eyes: Conjunctivae are normal. Normal extraocular movements. Cardiovascular: Normal rate, regular rhythm. No murmurs, rubs, or gallops. Respiratory: Normal respiratory effort without tachypnea nor retractions. Breath sounds are clear and equal bilaterally. No wheezes/rales/rhonchi. Gastrointestinal: Soft and nontender. Normal bowel sounds Musculoskeletal: Nontender with normal range of motion in extremities. No lower extremity tenderness nor edema. Neurologic:  Normal speech and language. No gross focal neurologic deficits are appreciated.  Skin:  Skin is warm, dry and intact. No rash noted. Psychiatric: Mood and affect are normal. Speech and behavior are normal.  ____________________________________________  ED COURSE:  As part of my medical decision making, I reviewed the following data within the [place] History obtained from family if available, nursing notes, old chart and ekg, as well as notes from prior ED visits. Patient presented for low back pain, we will assess with labs and imaging as indicated at this time.   Procedures  Fernando Salas was evaluated in Emergency Department on 08/01/2018 for the symptoms described in the history of present illness. He was evaluated in the context of the global COVID-19 pandemic, which necessitated consideration that the patient might be at risk for infection with the SARS-CoV-2 virus that causes COVID-19. Institutional protocols and algorithms that pertain to the evaluation of patients at risk for COVID-19 are in a state of rapid change based on information released by regulatory bodies including the CDC and federal and state organizations. These policies and algorithms were followed during the patient's care in the ED.  ____________________________________________   LABS (pertinent  positives/negatives)  Labs Reviewed  CBC WITH DIFFERENTIAL/PLATELET  COMPREHENSIVE METABOLIC PANEL    RADIOLOGY  Chest x-ray, lumbar spine x-ray IMPRESSION: 1. Cardiac enlargement and pulmonary vascular congestion. IMPRESSION: 1. No acute findings. 2. Mild degenerative disc disease. ____________________________________________   DIFFERENTIAL DIAGNOSIS   Spasm, strain, sciatica, renal colic, dissection  FINAL ASSESSMENT AND PLAN  Low back pain, end-stage renal disease on dialysis, pulmonary vascular congestion   Plan: The patient had presented for low back pain. Patient's labs were refused by the patient.  He has refused all lab draws. Patient's imaging did reveal cardiac enlargement and pulmonary vascular congestion.  Have discussed with nephrology who will arrange dialysis.   Laurence Aly, MD    Note: This note was generated in part or whole with voice recognition software. Voice recognition is usually quite accurate but there are transcription errors that can and very often do occur. I apologize for any typographical errors that were not detected and corrected.     Earleen Newport, MD 08/01/18 1340

## 2018-08-01 NOTE — Progress Notes (Signed)
[facility]  ROUNDING NOTE   Subjective:   Fernando Salas presents to the ED with shortness of breath.    HEMODIALYSIS FLOWSHEET:  Blood Flow Rate (mL/min): 400 mL/min Arterial Pressure (mmHg): -200 mmHg Venous Pressure (mmHg): 220 mmHg Transmembrane Pressure (mmHg): 60 mmHg Ultrafiltration Rate (mL/min): 500 mL/min Dialysate Flow Rate (mL/min): 800 ml/min Conductivity: Machine : 14 Conductivity: Machine : 14 Dialysis Fluid Bolus: Normal Saline Bolus Amount (mL): 250 mL  Elevated BP  Objective:  Vital signs in last 24 hours:  Temp:  [97.8 F (36.6 C)-98 F (36.7 C)] 98 F (36.7 C) (06/01 1450) Pulse Rate:  [80-86] 80 (06/01 1745) Resp:  [16-26] 18 (06/01 1745) BP: (174-198)/(111-134) 195/121 (06/01 1745) SpO2:  [94 %-100 %] 98 % (06/01 1745) Weight:  [102.1 kg] 102.1 kg (06/01 1117)  Weight change:  Filed Weights   08/01/18 1117  Weight: 102.1 kg    Intake/Output: No intake/output data recorded.   Intake/Output this shift:  No intake/output data recorded.  Physical Exam: General: NAD,   Head: Normocephalic, atraumatic. Moist oral mucosal membranes  Eyes: Anicteric,    Neck: Supple,   Lungs:  Clear to auscultation  Heart: Regular rate and rhythm  Abdomen:  Soft, nontender  Extremities:  + peripheral edema.  Neurologic: Nonfocal, moving all four extremities  Skin: No lesions  Access: RIJ permcath    Basic Metabolic Panel: Recent Labs  Lab 07/27/18 1214 08/01/18 1217  NA 138 140  K 4.8 3.9  CL 101 102  CO2 21* 22  GLUCOSE 92 123*  BUN 62* 61*  CREATININE 13.88* 11.68*  CALCIUM 8.9 8.5*  PHOS 6.9*  --     Liver Function Tests: Recent Labs  Lab 07/27/18 1214 08/01/18 1217  AST  --  14*  ALT  --  11  ALKPHOS  --  60  BILITOT  --  1.2  PROT  --  6.9  ALBUMIN 3.6 3.4*   No results for input(s): LIPASE, AMYLASE in the last 168 hours. No results for input(s): AMMONIA in the last 168 hours.  CBC: Recent Labs  Lab  07/27/18 1214 08/01/18 1217  WBC 6.8 7.2  NEUTROABS  --  5.4  HGB 9.5* 9.9*  HCT 29.5* 30.0*  MCV 96.4 95.8  PLT 209 203    Cardiac Enzymes: No results for input(s): CKTOTAL, CKMB, CKMBINDEX, TROPONINI in the last 168 hours.  BNP: Invalid input(s): POCBNP  CBG: No results for input(s): GLUCAP in the last 168 hours.  Microbiology: Results for orders placed or performed during the hospital encounter of 04/08/18  MRSA PCR Screening     Status: None   Collection Time: 04/09/18  4:03 PM  Result Value Ref Range Status   MRSA by PCR NEGATIVE NEGATIVE Final    Comment:        The GeneXpert MRSA Assay (FDA approved for NASAL specimens only), is one component of a comprehensive MRSA colonization surveillance program. It is not intended to diagnose MRSA infection nor to guide or monitor treatment for MRSA infections. Performed at [facility], [address] [place], [place] [address]     Coagulation Studies: No results for input(s): LABPROT, INR in the last 72 hours.  Urinalysis: No results for input(s): COLORURINE, LABSPEC, PHURINE, GLUCOSEU, HGBUR, BILIRUBINUR, KETONESUR, PROTEINUR, UROBILINOGEN, NITRITE, LEUKOCYTESUR in the last 72 hours.  Invalid input(s): APPERANCEUR    Imaging: Dg Chest 1 View  Result Date: 08/01/2018 CLINICAL DATA:  Back pain. EXAM: CHEST  1 VIEW COMPARISON:  07/15/2018 FINDINGS: Right sided dialysis catheter is identified with tip in the right atrium. Aortic atherosclerosis. There is moderate cardiac enlargement. Pulmonary vascular congestion is identified. No pleural effusion or edema. IMPRESSION: 1. Cardiac enlargement and pulmonary vascular congestion. Electronically Signed   By: Kerby Moors M.D.   On: 08/01/2018 13:01   Dg Lumbar Spine 2-3 Views  Result Date: 08/01/2018 CLINICAL DATA:  Back pain. EXAM: LUMBAR SPINE - 2-3 VIEW COMPARISON:  01/12/2017 FINDINGS: There is no evidence of lumbar spine fracture. Alignment is normal. Mild  multi level ventral endplate spurring. IMPRESSION: 1. No acute findings. 2. Mild degenerative disc disease. Electronically Signed   By: Kerby Moors M.D.   On: 08/01/2018 12:52     Medications:    . [START ON 08/02/2018] Chlorhexidine Gluconate Cloth  6 each Topical Q0600  . oxyCODONE-acetaminophen  1 tablet Oral Once     Assessment/ Plan:  [name] is a 48 y.o. black male with end stage renal disease on hemodialysis, hypertension, CVA, sleep apnea, coronary artery disease, COPD, anti-social personality disorder.  No assigned dialysis unit available  1. Shortness of breath 2. End Stage Renal Disease 3. Anemia of chronic kidney disease 4. Secondary hyperparathyroidism 5. Severe Hypertension  Hemodialysis treatment today. 2K bath. UF goal of 1-1.5 liters. Orders prepared.  Patient states he has his BP meds at home and will take them when he gets there Concerned about weight loss Hold EPO as BP is high Will check TSH with next HD   LOS: 0 Fernando Salas 6/1/20206:09 PM

## 2018-08-01 NOTE — ED Notes (Signed)
Pt refusing blood work, md notified and states he will cancel orders.

## 2018-08-01 NOTE — ED Triage Notes (Signed)
Low back pain began yesterday evening. Denies radiation to either leg. Denies fevers. Denies all or injury.

## 2018-08-01 NOTE — ED Notes (Signed)
Pt given meal tray at this time 

## 2018-08-02 LAB — HEPATITIS B SURFACE ANTIGEN: Hepatitis B Surface Ag: NEGATIVE

## 2018-08-08 ENCOUNTER — Ambulatory Visit: Payer: Medicare Other | Admitting: Surgery

## 2018-08-08 MED ORDER — HEPARIN SODIUM (PORCINE) 1000 UNIT/ML IJ SOLN
2000.00 | INTRAMUSCULAR | Status: DC
Start: ? — End: 2018-08-08

## 2018-08-08 MED ORDER — PROBIOTIC COLIC PO LIQD
8000.00 | ORAL | Status: DC
Start: ? — End: 2018-08-08

## 2018-08-08 MED ORDER — CALCITRIOL 0.5 MCG PO CAPS
1.50 | ORAL_CAPSULE | ORAL | Status: DC
Start: ? — End: 2018-08-08

## 2018-08-08 MED ORDER — HEPARIN SODIUM (PORCINE) 1000 UNIT/ML IJ SOLN
3000.00 | INTRAMUSCULAR | Status: DC
Start: ? — End: 2018-08-08

## 2018-08-10 ENCOUNTER — Encounter: Payer: Self-pay | Admitting: *Deleted

## 2018-08-10 ENCOUNTER — Other Ambulatory Visit: Payer: Self-pay

## 2018-08-10 ENCOUNTER — Emergency Department
Admission: EM | Admit: 2018-08-10 | Discharge: 2018-08-10 | Discharge status: Against Medical Advice | Payer: Medicare Other | Attending: Emergency Medicine | Admitting: Emergency Medicine

## 2018-08-10 DIAGNOSIS — Z5321 Procedure and treatment not carried out due to patient leaving prior to being seen by health care provider: Secondary | ICD-10-CM | POA: Diagnosis not present

## 2018-08-10 DIAGNOSIS — R0602 Shortness of breath: Secondary | ICD-10-CM | POA: Insufficient documentation

## 2018-08-10 DIAGNOSIS — Z992 Dependence on renal dialysis: Secondary | ICD-10-CM | POA: Diagnosis not present

## 2018-08-10 NOTE — ED Triage Notes (Addendum)
Pt to ED reporting it is his day for dialysis and he usually goes to [facility] but he needed to be closer to [facility] for evaluation of a hernia. SOB reported but pt states it is no more than normal.   Pt had normal dialysis on MOnday.

## 2018-08-31 ENCOUNTER — Emergency Department
Admission: EM | Admit: 2018-08-31 | Discharge: 2018-08-31 | Disposition: A | Discharge status: Home / Self Care | Payer: Medicare Other | Attending: Emergency Medicine | Admitting: Emergency Medicine

## 2018-08-31 ENCOUNTER — Encounter: Payer: Self-pay | Admitting: *Deleted

## 2018-08-31 ENCOUNTER — Telehealth: Payer: Self-pay

## 2018-08-31 ENCOUNTER — Other Ambulatory Visit: Payer: Self-pay

## 2018-08-31 ENCOUNTER — Encounter: Payer: Self-pay | Admitting: Surgery

## 2018-08-31 ENCOUNTER — Ambulatory Visit (INDEPENDENT_AMBULATORY_CARE_PROVIDER_SITE_OTHER): Payer: Medicare Other | Admitting: Surgery

## 2018-08-31 VITALS — BP 181/125 | HR 67 | Temp 97.2°F | Ht 70.0 in | Wt 221.0 lb

## 2018-08-31 DIAGNOSIS — N186 End stage renal disease: Secondary | ICD-10-CM | POA: Diagnosis not present

## 2018-08-31 DIAGNOSIS — Z79899 Other long term (current) drug therapy: Secondary | ICD-10-CM | POA: Insufficient documentation

## 2018-08-31 DIAGNOSIS — I2510 Atherosclerotic heart disease of native coronary artery without angina pectoris: Secondary | ICD-10-CM | POA: Insufficient documentation

## 2018-08-31 DIAGNOSIS — Z992 Dependence on renal dialysis: Secondary | ICD-10-CM | POA: Insufficient documentation

## 2018-08-31 DIAGNOSIS — I120 Hypertensive chronic kidney disease with stage 5 chronic kidney disease or end stage renal disease: Secondary | ICD-10-CM | POA: Diagnosis not present

## 2018-08-31 DIAGNOSIS — F17210 Nicotine dependence, cigarettes, uncomplicated: Secondary | ICD-10-CM | POA: Insufficient documentation

## 2018-08-31 DIAGNOSIS — I252 Old myocardial infarction: Secondary | ICD-10-CM | POA: Insufficient documentation

## 2018-08-31 DIAGNOSIS — K4091 Unilateral inguinal hernia, without obstruction or gangrene, recurrent: Secondary | ICD-10-CM

## 2018-08-31 DIAGNOSIS — Z4932 Encounter for adequacy testing for peritoneal dialysis: Secondary | ICD-10-CM | POA: Insufficient documentation

## 2018-08-31 DIAGNOSIS — J449 Chronic obstructive pulmonary disease, unspecified: Secondary | ICD-10-CM | POA: Insufficient documentation

## 2018-08-31 DIAGNOSIS — R0602 Shortness of breath: Secondary | ICD-10-CM | POA: Diagnosis present

## 2018-08-31 DIAGNOSIS — Z7901 Long term (current) use of anticoagulants: Secondary | ICD-10-CM | POA: Insufficient documentation

## 2018-08-31 MED ORDER — GENERIC EXTERNAL MEDICATION
1.80 | Status: DC
Start: ? — End: 2018-08-31

## 2018-08-31 MED ORDER — HEPARIN SODIUM (PORCINE) 1000 UNIT/ML IJ SOLN
3000.00 | INTRAMUSCULAR | Status: DC
Start: ? — End: 2018-08-31

## 2018-08-31 MED ORDER — OXYCODONE-ACETAMINOPHEN 5-325 MG PO TABS
1.00 | ORAL_TABLET | ORAL | Status: DC
Start: ? — End: 2018-08-31

## 2018-08-31 MED ORDER — HEPARIN SODIUM (PORCINE) 1000 UNIT/ML IJ SOLN
4000.00 | INTRAMUSCULAR | Status: DC
Start: ? — End: 2018-08-31

## 2018-08-31 MED ORDER — SODIUM CHLORIDE 0.9 % IV SOLN
200.00 | INTRAVENOUS | Status: DC
Start: ? — End: 2018-08-31

## 2018-08-31 MED ORDER — GENERIC EXTERNAL MEDICATION
1.90 | Status: DC
Start: ? — End: 2018-08-31

## 2018-08-31 NOTE — Progress Notes (Signed)
Patient needs to be scheduled for a robotic right inguinal hernia repair with Dr. Dahlia Byes.  The patient will require cardiac clearance prior.   The patient is scheduled to see Dr. Elijio Miles on 09-06-18 at 11:15 am. Patient reports that it has been about a year since his last visit with cardiology.   We will await cardiac clearance before scheduling surgery.   Patient will be contacted once clearance is received in regards to scheduling.   The patient is aware he will need to Pre-admit and have COVID-19 testing done prior to surgery.

## 2018-08-31 NOTE — ED Notes (Signed)
Per Dr Cherylann Banas no labs

## 2018-08-31 NOTE — ED Notes (Addendum)
See triage note  States he is here for dialysis  Denies any other sx's    States he had dialysis on weds

## 2018-08-31 NOTE — ED Provider Notes (Signed)
[facility] Emergency Department Provider Note  ____________________________________________  Time seen: Approximately 3:06 PM  I have reviewed the triage vital signs and the nursing notes.   HISTORY  Chief Complaint Shortness of Breath    HPI Fernando Salas is a 48 y.o. male presents to the emergency department for dialysis. Patient states that he missed dialysis on Monday. Patient states that he has experienced some mild shortness of breath that he experiences when he needs dialysis.         Past Medical History:  Diagnosis Date  . CAD (coronary artery disease)    status post multiple myocardial infarctions  . COPD (chronic obstructive pulmonary disease) ([place])   . ESRD (end stage renal disease) ([place])   . Hyperlipidemia   . Hypertension   . Myocardial infarction ([place])    " I had 13 heart attacks, 8 massive"  . Proteinuria   . Renal insufficiency   . Secondary hyperparathyroidism of renal origin ([place])   . Shortness of breath dyspnea    " when I drink a cup of cold water too fast"  . Sleep apnea    pt stated " long story" does not wear CPAP  . Stroke [facility])     Patient Active Problem List   Diagnosis Date Noted  . ESRD (end stage renal disease) on dialysis ([place]) 06/27/2018  . Hypertensive urgency, malignant 04/08/2018  . Hypertensive urgency 03/25/2018  . Pulmonary edema 03/09/2017  . 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 Fluid overload 02/08/2017  . Accelerated hypertension 01/12/2017  . AKI (acute kidney injury) ([place])   . Unstable angina ([place]) 11/01/2016    Past Surgical History:  Procedure Laterality Date  . AV FISTULA PLACEMENT Left 01-24-14   By Dr. Hortencia Pilar in [place]  . AV FISTULA PLACEMENT Right 04/30/2014   Procedure: RIGHT BRACHIOCEPHALIC ARTERIOVENOUS (AV) FISTULA CREATION;  Surgeon: Elam Dutch, MD;  Location: [facility] OR;  Service: Vascular;  Laterality: Right;  . AV FISTULA PLACEMENT Left 05/31/2014   Procedure:  Left INSERTION OF ARTERIOVENOUS (AV) GORE-TEX GRAFT ARM;  Surgeon: Elam Dutch, MD;  Location: [place];  Service: Vascular;  Laterality: Left;  . CARDIAC CATHETERIZATION    . CORONARY STENT PLACEMENT     13 stents total  . DIALYSIS/PERMA CATHETER INSERTION N/A 01/15/2017   Procedure: DIALYSIS/PERMA CATHETER INSERTION;  Surgeon: Katha Cabal, MD;  Location: [place] CV LAB;  Service: Cardiovascular;  Laterality: N/A;    Prior to Admission medications   Medication Sig Start Date End Date Taking? Authorizing Provider  amLODipine (NORVASC) 10 MG tablet Take 1 tablet (10 mg total) by mouth daily. 05/01/17 07/01/18  Lisa Roca, MD  atorvastatin (LIPITOR) 40 MG tablet Take 1 tablet (40 mg total) by mouth daily. 11/03/16   Demetrios Loll, MD  calcium acetate (PHOSLO) 667 MG capsule Take 1,334 mg by mouth 3 (three) times daily with meals. 12/03/17 12/03/18  [provider]  carvedilol (COREG) 25 MG tablet Take 1 tablet (25 mg total) by mouth 2 (two) times daily. 05/01/17 07/01/18  Lisa Roca, MD  clopidogrel (PLAVIX) 75 MG tablet Take 1 tablet (75 mg total) by mouth daily. 11/03/16   Demetrios Loll, MD  furosemide (LASIX) 80 MG tablet Take 80 mg by mouth See admin instructions. Take 1 tablet (80mg ) every morning on non-dialysis days 05/16/18   [provider]  hydrALAZINE (APRESOLINE) 100 MG tablet Take 1 tablet (100 mg total) by mouth 4 (four) times daily. Patient taking differently:  Take 100 mg by mouth every 8 (eight) hours.  05/01/17 07/01/18  Lisa Roca, MD  isosorbide dinitrate (ISORDIL) 30 MG tablet Take 30 mg by mouth 3 (three) times daily.     [provider]    Allergies Ibuprofen, Viagra [sildenafil], and Clonidine derivatives  Family History  Problem Relation Age of Onset  . Heart disease Mother   . Hyperlipidemia Mother   . Hypertension Mother   . Kidney disease Father   . Kidney disease Brother   . Hyperlipidemia Sister   . Hyperlipidemia Daughter   .  Hypertension Sister   . Hypertension Daughter     Social History Social History   Tobacco Use  . Smoking status: Current Every Day Smoker    Packs/day: 0.25    Years: 25.00    Pack years: 6.25    Types: Cigarettes  . Smokeless tobacco: Never Used  Substance Use Topics  . Alcohol use: No    Alcohol/week: 0.0 standard drinks    Frequency: Never    Comment: occasional  . Drug use: Not Currently    Types: Marijuana    Comment: daily     Review of Systems  Constitutional: No fever/chills Eyes: No visual changes. No discharge ENT: No upper respiratory complaints. Cardiovascular: no chest pain. Respiratory: no cough. No SOB. Gastrointestinal: No abdominal pain.  No nausea, no vomiting.  No diarrhea.  No constipation. Musculoskeletal: Negative for musculoskeletal pain. Skin: Negative for rash, abrasions, lacerations, ecchymosis. Neurological: Negative for headaches, focal weakness or numbness.  ____________________________________________   PHYSICAL EXAM:  VITAL SIGNS: ED Triage Vitals [08/31/18 1417]  Enc Vitals Group     BP (!) 171/129     Pulse Rate 70     Resp 18     Temp 98 F (36.7 C)     Temp Source Oral     SpO2 97 %     Weight 225 lb (102.1 kg)     Height 6' (1.829 m)     Head Circumference      Peak Flow      Pain Score 0     Pain Loc      Pain Edu?      Excl. in [place]?      Constitutional: Alert and oriented. Well appearing and in no acute distress. Eyes: Conjunctivae are normal. PERRL. EOMI. Head: Atraumatic. Cardiovascular: Normal rate, regular rhythm. Normal S1 and S2.  Good peripheral circulation. Respiratory: Normal respiratory effort without tachypnea or retractions. Lungs CTAB. Good air entry to the bases with no decreased or absent breath sounds. Gastrointestinal: Bowel sounds 4 quadrants. Soft and nontender to palpation. No guarding or rigidity. No palpable masses. No distention. No CVA tenderness. Musculoskeletal: Full range of motion to  all extremities. No gross deformities appreciated. Neurologic:  Normal speech and language. No gross focal neurologic deficits are appreciated.  Skin:  Skin is warm, dry and intact. No rash noted. Psychiatric: Mood and affect are normal. Speech and behavior are normal. Patient exhibits appropriate insight and judgement.   ____________________________________________   LABS (all labs ordered are listed, but only abnormal results are displayed)  Labs Reviewed - No data to display ____________________________________________  EKG   ____________________________________________  RADIOLOGY   No results found.  ____________________________________________    PROCEDURES  Procedure(s) performed:    Procedures    Medications - No data to display   ____________________________________________   INITIAL IMPRESSION / ASSESSMENT AND PLAN / ED COURSE  Pertinent labs & imaging results that were  available during my care of the patient were reviewed by me and considered in my medical decision making (see chart for details).  Review of the [place] CSRS was performed in accordance of the [place] prior to dispensing any controlled drugs.         ----------------------------------------- 3:07 PM on 08/31/2018 -----------------------------------------  Spoke with nephrologist on-call, Dr. Holley Raring.  Reviewed patient's history and need for dialysis.  He said that it would be several hours and probably in the late evening before patient could get dialysis tonight.  Will update patient..  Assessment and Plan:  Patient left without getting dialysis.  ____________________________________________  FINAL CLINICAL IMPRESSION(S) / ED DIAGNOSES  Final diagnoses:  Dialysis patient [facility])      NEW MEDICATIONS STARTED DURING THIS VISIT:  ED Discharge Orders    None          This chart was dictated using voice recognition software/Dragon. Despite best efforts to proofread, errors  can occur which can change the meaning. Any change was purely unintentional.    Lannie Fields, PA-C 08/31/18 2217    Delman Kitten, MD 09/09/18 [phone]

## 2018-08-31 NOTE — ED Notes (Signed)
Explained the wait  Dinner tray given  And warm blankets

## 2018-08-31 NOTE — TOC Initial Note (Signed)
Transition of Care [facility]) - Initial/Assessment Note    Patient Details  Name: MAXIMIANO LOTT MRN: [id number] Date of Birth: [birth year]  [facility]) CM/SW Contact:    Marshell Garfinkel, RN Phone Number: 08/31/2018, 3:14 PM  Clinical Narrative:                  ED RNCM spoke with Estill Bamberg with [employer] after learning that patient has presented to ED for hemodialysis.  Per Estill Bamberg, patient has lost two outpatient dialysis centers and once at [facility], patient threatened to "shoot them up" and had to call police.        Patient Goals and CMS Choice        Expected Discharge Plan and Services                                                Prior Living Arrangements/Services                       Activities of Daily Living      Permission Sought/Granted                  Emotional Assessment              Admission diagnosis:  sob Patient Active Problem List   Diagnosis Date Noted  . ESRD (end stage renal disease) on dialysis ([place]) 06/27/2018  . Hypertensive urgency, malignant 04/08/2018  . Hypertensive urgency 03/25/2018  . Pulmonary edema 03/09/2017  . 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 Fluid overload 02/08/2017  . Accelerated hypertension 01/12/2017  . AKI (acute kidney injury) ([place])   . Unstable angina ([place]) 11/01/2016   PCP:  System, Pcp Not In Pharmacy:   [id number] - [place], [place] [address] Phone: [phone] Fax: [phone]     Social Determinants of Health (SDOH) Interventions    Readmission Risk Interventions No flowsheet data found.

## 2018-08-31 NOTE — Progress Notes (Signed)
Cardiac Clearance faxed to Dr.Tejan-sie office at this time.

## 2018-08-31 NOTE — ED Notes (Signed)
Gave pt food trays with apple juice.

## 2018-08-31 NOTE — Patient Instructions (Addendum)
We will obtain Cardiac Clearance from Dr. Elijio Miles at [place] are scheduled to see Dr.Tejan-sie 09/06/18 @v  11:15 am. Please arrive 15 minutes prior to your appointment to complete paperwork.  Please see your follow up appointment listed below.    Please call our office if you have questions or concerns.   Inguinal Hernia, Adult An inguinal hernia is when fat or your intestines push through a weak spot in a muscle where your leg meets your lower belly (groin). This causes a rounded lump (bulge). This kind of hernia could also be:  In your scrotum, if you are male.  In folds of skin around your vagina, if you are male. There are three types of inguinal hernias. These include:  Hernias that can be pushed back into the belly (are reducible). This type rarely causes pain.  Hernias that cannot be pushed back into the belly (are incarcerated).  Hernias that cannot be pushed back into the belly and lose their blood supply (are strangulated). This type needs emergency surgery. If you do not have symptoms, you may not need treatment. If you have symptoms or a large hernia, you may need surgery. Follow these instructions at home: Lifestyle  Do these things if told by your doctor so you do not have trouble pooping (constipation): ? Drink enough fluid to keep your pee (urine) pale yellow. ? Eat foods that have a lot of fiber. These include fresh fruits and vegetables, whole grains, and beans. ? Limit foods that are high in fat and processed sugars. These include foods that are fried or sweet. ? Take medicine for trouble pooping.  Avoid lifting heavy objects.  Avoid standing for long amounts of time.  Do not use any products that contain nicotine or tobacco. These include cigarettes and e-cigarettes. If you need help quitting, ask your doctor.  Stay at a healthy weight. General instructions  You may try to push your hernia in by very gently pressing on it when you are lying  down. Do not try to force the bulge back in if it will not push in easily.  Watch your hernia for any changes in shape, size, or color. Tell your doctor if you see any changes.  Take over-the-counter and prescription medicines only as told by your doctor.  Keep all follow-up visits as told by your doctor. This is important. Contact a doctor if:  You have a fever.  You have new symptoms.  Your symptoms get worse. Get help right away if:  The area where your leg meets your lower belly has: ? Pain that gets worse suddenly. ? A bulge that gets bigger suddenly, and it does not get smaller after that. ? A bulge that turns red or purple. ? A bulge that is painful when you touch it.  You are a man, and your scrotum: ? Suddenly feels painful. ? Suddenly changes in size.  You cannot push the hernia in by very gently pressing on it when you are lying down. Do not try to force the bulge back in if it will not push in easily.  You feel sick to your stomach (nauseous), and that feeling does not go away.  You throw up (vomit), and that keeps happening.  You have a fast heartbeat.  You cannot poop (have a bowel movement) or pass gas. These symptoms may be an emergency. Do not wait to see if the symptoms will go away. Get medical help right away. Call your local emergency services (911  in the [place] Summary  An inguinal hernia is when fat or your intestines push through a weak spot in a muscle where your leg meets your lower belly (groin). This causes a rounded lump (bulge).  If you do not have symptoms, you may not need treatment. If you have symptoms or a large hernia, you may need surgery.  Avoid lifting heavy objects. Also avoid standing for long amounts of time.  Do not try to force the bulge back in if it will not push in easily. This information is not intended to replace advice given to you by your health care provider. Make sure you discuss any questions you have with your health  care provider. Document Released: 03/19/2006 Document Revised: 03/20/2017 Document Reviewed: 11/18/2016 Elsevier Patient Education  2020 [employer].

## 2018-08-31 NOTE — ED Triage Notes (Signed)
Pt arrived to have dialysis - pt last had dialysis Friday because he did not want to wait Monday - He is now c/o [facility] which he states in normal for him when he needs dialysis

## 2018-09-01 ENCOUNTER — Encounter: Payer: Self-pay | Admitting: Surgery

## 2018-09-01 NOTE — Telephone Encounter (Signed)
Error

## 2018-09-01 NOTE — Progress Notes (Signed)
Patient ID: Fernando Salas, male   DOB: 05/31/1970, 48 y.o.   MRN: [id number]  HPI Fernando Salas is a 48 y.o. male in consultation for a symptomatic right inguinal hernia.  He does have multiple comorbidities including coronary artery disease status post stent placement, end-stage renal disease on hemodialysis via permacath.  Does have a history of hypertension and sleep apnea.  He reports over the last several months he started to have a right lump on his inguinal area.  He endorses intermittent right inguinal pain that is sharp in nature.  The pain is intermittent and worsening with Valsalva.  No fevers no chills no evidence of incarceration or strangulation.  Has had multiple heart attacks in the past and is on dual antiplatelet therapy to include Plavix and aspirin.  He denies any previous abdominal operations. He does have significant lower extremity edema secondary to his end-stage renal disease and is on high-dose Lasix.  He says he probably urinates about 200 cc a day. CMP personally reviewed with a creatinine of 11.6 all the other values were unremarkable.  CBC globin of 9.9 normal platelets. Does smoke daily about 5 cigarettes a day.  HPI  Past Medical History:  Diagnosis Date  . CAD (coronary artery disease)    status post multiple myocardial infarctions  . COPD (chronic obstructive pulmonary disease) ([place])   . ESRD (end stage renal disease) ([place])   . Hyperlipidemia   . Hypertension   . Myocardial infarction ([place])    " I had 13 heart attacks, 8 massive"  . Proteinuria   . Renal insufficiency   . Secondary hyperparathyroidism of renal origin ([place])   . Shortness of breath dyspnea    " when I drink a cup of cold water too fast"  . Sleep apnea    pt stated " long story" does not wear CPAP  . Stroke [facility])     Past Surgical History:  Procedure Laterality Date  . AV FISTULA PLACEMENT Left 01-24-14   By Dr. Hortencia Pilar in [place]  . AV FISTULA PLACEMENT Right 04/30/2014   Procedure: RIGHT BRACHIOCEPHALIC ARTERIOVENOUS (AV) FISTULA CREATION;  Surgeon: Elam Dutch, MD;  Location: [facility] OR;  Service: Vascular;  Laterality: Right;  . AV FISTULA PLACEMENT Left 05/31/2014   Procedure: Left INSERTION OF ARTERIOVENOUS (AV) GORE-TEX GRAFT ARM;  Surgeon: Elam Dutch, MD;  Location: [place];  Service: Vascular;  Laterality: Left;  . CARDIAC CATHETERIZATION    . CORONARY STENT PLACEMENT     13 stents total  . DIALYSIS/PERMA CATHETER INSERTION N/A 01/15/2017   Procedure: DIALYSIS/PERMA CATHETER INSERTION;  Surgeon: Katha Cabal, MD;  Location: [place] CV LAB;  Service: Cardiovascular;  Laterality: N/A;    Family History  Problem Relation Age of Onset  . Heart disease Mother   . Hyperlipidemia Mother   . Hypertension Mother   . Kidney disease Father   . Kidney disease Brother   . Hyperlipidemia Sister   . Hyperlipidemia Daughter   . Hypertension Sister   . Hypertension Daughter     Social History Social History   Tobacco Use  . Smoking status: Current Every Day Smoker    Packs/day: 0.25    Years: 25.00    Pack years: 6.25    Types: Cigarettes  . Smokeless tobacco: Never Used  Substance Use Topics  . Alcohol use: No    Alcohol/week: 0.0 standard drinks    Frequency: Never    Comment: occasional  . Drug use:  Not Currently    Types: Marijuana    Comment: daily    Allergies  Allergen Reactions  . Ibuprofen Other (See Comments)    Kidney   . Viagra [Sildenafil]     Lips swell   . Clonidine Derivatives Rash    Current Outpatient Medications  Medication Sig Dispense Refill  . atorvastatin (LIPITOR) 40 MG tablet Take 1 tablet (40 mg total) by mouth daily. 30 tablet 2  . calcium acetate (PHOSLO) 667 MG capsule Take 1,334 mg by mouth 3 (three) times daily with meals.    . clopidogrel (PLAVIX) 75 MG tablet Take 1 tablet (75 mg total) by mouth daily. 30 tablet 2  . furosemide (LASIX) 80 MG tablet Take 80 mg by mouth See admin  instructions. Take 1 tablet (80mg ) every morning on non-dialysis days    . isosorbide dinitrate (ISORDIL) 30 MG tablet Take 30 mg by mouth 3 (three) times daily.     Marland Kitchen amLODipine (NORVASC) 10 MG tablet Take 1 tablet (10 mg total) by mouth daily. 30 tablet 0  . carvedilol (COREG) 25 MG tablet Take 1 tablet (25 mg total) by mouth 2 (two) times daily. 60 tablet 0  . hydrALAZINE (APRESOLINE) 100 MG tablet Take 1 tablet (100 mg total) by mouth 4 (four) times daily. (Patient taking differently: Take 100 mg by mouth every 8 (eight) hours. ) 120 tablet 0   No current facility-administered medications for this visit.      Review of Systems Full ROS  was asked and was negative except for the information on the HPI  Physical Exam Blood pressure (!) 181/125, pulse 67, temperature (!) 97.2 F (36.2 C), temperature source Skin, height 5\' 10"  (1.778 m), weight 221 lb (100.2 kg), SpO2 98 %. CONSTITUTIONAL: NAD. EYES: Pupils are equal, round, and reactive to light, Sclera are non-icteric. EARS, NOSE, MOUTH AND THROAT: The oropharynx is clear. The oral mucosa is pink and moist. Hearing is intact to voice. LYMPH NODES:  Lymph nodes in the neck are normal. RESPIRATORY:  Lungs are clear. There is normal respiratory effort, with equal breath sounds bilaterally, and without pathologic use of accessory muscles. Right permacath in place, no evidence of infection CARDIOVASCULAR: Heart is regular without murmurs, gallops, or rubs. GI: The abdomen is soft, nontender, and nondistended. There are no palpable masses. There is no hepatosplenomegaly. There are normal bowel sounds in all quadrants. There is a large reducible RIH, mildly tender to palpation, no peritonitis GU: Rectal deferred.   MUSCULOSKELETAL: Normal muscle strength and tone. No cyanosis or edema.   SKIN: Turgor is good and there are no pathologic skin lesions or ulcers. NEUROLOGIC: Motor and sensation is grossly normal. Cranial nerves are grossly  intact. PSYCH:  Oriented to person, place and time. Affect is normal.  Data Reviewed  I have personally reviewed the patient's imaging, laboratory findings and medical records.    Assessment/Plan   48 year old male with multiple comorbidities including significant coronary artery disease on dual antiplatelet therapy, end-stage renal disease on dialysis.  Now presents with a symptomatic right inguinal hernia.  I do recommend repair and I do think that he is a good candidate for robotic approach.  Before we perform any surgical intervention he will need cardiac clearance.  He has not seen a cardiologist in over 1 year.  Will make an appointment and a consultation for preoperative evaluation.  He also needs to withhold his Plavix for about a week before planned surgery.  We will also test him  for covid 19. Discussed with the patient in detail.  Risk, benefits and possible complications to include: Bleeding, injury to the vas and testicular vessels, chronic pain mesh issues and infection.  He understands and wishes to proceed. extensive counseling provided  Caroleen Hamman, MD FACS General Surgeon 09/01/2018, 2:43 PM

## 2018-09-09 ENCOUNTER — Non-Acute Institutional Stay
Admission: EM | Admit: 2018-09-09 | Discharge: 2018-09-09 | Discharge status: Against Medical Advice | Payer: Medicare Other | Attending: Emergency Medicine | Admitting: Emergency Medicine

## 2018-09-09 ENCOUNTER — Other Ambulatory Visit: Payer: Self-pay

## 2018-09-09 DIAGNOSIS — I120 Hypertensive chronic kidney disease with stage 5 chronic kidney disease or end stage renal disease: Secondary | ICD-10-CM | POA: Insufficient documentation

## 2018-09-09 DIAGNOSIS — D631 Anemia in chronic kidney disease: Secondary | ICD-10-CM | POA: Insufficient documentation

## 2018-09-09 DIAGNOSIS — Z8673 Personal history of transient ischemic attack (TIA), and cerebral infarction without residual deficits: Secondary | ICD-10-CM | POA: Diagnosis not present

## 2018-09-09 DIAGNOSIS — Z7902 Long term (current) use of antithrombotics/antiplatelets: Secondary | ICD-10-CM | POA: Insufficient documentation

## 2018-09-09 DIAGNOSIS — Z79899 Other long term (current) drug therapy: Secondary | ICD-10-CM | POA: Diagnosis not present

## 2018-09-09 DIAGNOSIS — Z8349 Family history of other endocrine, nutritional and metabolic diseases: Secondary | ICD-10-CM | POA: Insufficient documentation

## 2018-09-09 DIAGNOSIS — E875 Hyperkalemia: Secondary | ICD-10-CM | POA: Diagnosis not present

## 2018-09-09 DIAGNOSIS — E785 Hyperlipidemia, unspecified: Secondary | ICD-10-CM | POA: Insufficient documentation

## 2018-09-09 DIAGNOSIS — J449 Chronic obstructive pulmonary disease, unspecified: Secondary | ICD-10-CM | POA: Insufficient documentation

## 2018-09-09 DIAGNOSIS — F17210 Nicotine dependence, cigarettes, uncomplicated: Secondary | ICD-10-CM | POA: Insufficient documentation

## 2018-09-09 DIAGNOSIS — I252 Old myocardial infarction: Secondary | ICD-10-CM | POA: Insufficient documentation

## 2018-09-09 DIAGNOSIS — I2510 Atherosclerotic heart disease of native coronary artery without angina pectoris: Secondary | ICD-10-CM | POA: Diagnosis not present

## 2018-09-09 DIAGNOSIS — Z8249 Family history of ischemic heart disease and other diseases of the circulatory system: Secondary | ICD-10-CM | POA: Insufficient documentation

## 2018-09-09 DIAGNOSIS — N186 End stage renal disease: Secondary | ICD-10-CM | POA: Diagnosis present

## 2018-09-09 DIAGNOSIS — N2581 Secondary hyperparathyroidism of renal origin: Secondary | ICD-10-CM | POA: Diagnosis not present

## 2018-09-09 DIAGNOSIS — Z886 Allergy status to analgesic agent status: Secondary | ICD-10-CM | POA: Diagnosis not present

## 2018-09-09 DIAGNOSIS — Z992 Dependence on renal dialysis: Secondary | ICD-10-CM | POA: Diagnosis present

## 2018-09-09 DIAGNOSIS — G4730 Sleep apnea, unspecified: Secondary | ICD-10-CM | POA: Diagnosis not present

## 2018-09-09 LAB — RENAL FUNCTION PANEL
Albumin: 3.3 g/dL — ABNORMAL LOW (ref 3.5–5.0)
Anion gap: 16 — ABNORMAL HIGH (ref 5–15)
BUN: 77 mg/dL — ABNORMAL HIGH (ref 6–20)
CO2: 20 mmol/L — ABNORMAL LOW (ref 22–32)
Calcium: 8.7 mg/dL — ABNORMAL LOW (ref 8.9–10.3)
Chloride: 104 mmol/L (ref 98–111)
Creatinine, Ser: 18.34 mg/dL — ABNORMAL HIGH (ref 0.61–1.24)
GFR calc Af Amer: 3 mL/min — ABNORMAL LOW (ref >60)
GFR calc non Af Amer: 3 mL/min — ABNORMAL LOW (ref >60)
Glucose, Bld: 89 mg/dL (ref 70–99)
Phosphorus: 7.5 mg/dL — ABNORMAL HIGH (ref 2.5–4.6)
Potassium: 6.5 mmol/L (ref 3.5–5.1)
Sodium: 140 mmol/L (ref 135–145)

## 2018-09-09 LAB — CBC
HCT: 34.5 % — ABNORMAL LOW (ref 39.0–52.0)
Hemoglobin: 11 g/dL — ABNORMAL LOW (ref 13.0–17.0)
MCH: 29.3 pg (ref 26.0–34.0)
MCHC: 31.9 g/dL (ref 30.0–36.0)
MCV: 92 fL (ref 80.0–100.0)
Platelets: 218 10*3/uL (ref 150–400)
RBC: 3.75 MIL/uL — ABNORMAL LOW (ref 4.22–5.81)
RDW: 15.9 % — ABNORMAL HIGH (ref 11.5–15.5)
WBC: 5.1 10*3/uL (ref 4.0–10.5)
nRBC: 0 % (ref 0.0–0.2)

## 2018-09-09 MED ORDER — IBUPROFEN 400 MG PO TABS
400.0000 mg | ORAL_TABLET | ORAL | Status: DC | PRN
  Administered 2018-09-09: 400 mg via ORAL
  Filled 2018-09-09 (×2): qty 1

## 2018-09-09 MED ORDER — CHLORHEXIDINE GLUCONATE CLOTH 2 % EX PADS
6.0000 | MEDICATED_PAD | Freq: Every day | CUTANEOUS | Status: DC
  Administered 2018-09-09: 6 via TOPICAL
  Filled 2018-09-09: qty 6

## 2018-09-09 NOTE — Progress Notes (Signed)
Pre HD Assessment, Pt hypertensive, expected to improve w/ TX MD aware.    09/09/18 1015  Neurological  Level of Consciousness Alert  Orientation Level Oriented X4  Respiratory  Respiratory Pattern Regular  Chest Assessment Chest expansion symmetrical  Bilateral Breath Sounds Diminished;Fine crackles  Cough None  Cardiac  Pulse Regular  Heart Sounds S1, S2  ECG Monitor Yes  Cardiac Rhythm NSR  Vascular  R Radial Pulse +2  L Radial Pulse +2  Edema Generalized  Generalized Edema +2  Psychosocial  Psychosocial (WDL) WDL

## 2018-09-09 NOTE — ED Triage Notes (Addendum)
Pt here for Dialysis. Pt reports L leg swelling and SHOB due to missing dialysis on Wednesday.

## 2018-09-09 NOTE — Progress Notes (Signed)
HD Tx completed tolerated well    09/09/18 1348  Vital Signs  Pulse Rate 74  Pulse Rate Source Monitor  Resp 18  BP (!) 173/102  BP Location Right Arm  BP Method Automatic  Patient Position (if appropriate) Sitting  Oxygen Therapy  SpO2 100 %  O2 Device Room Air  During Hemodialysis Assessment  HD Safety Checks Performed Yes  KECN 72.6 KECN  Dialysis Fluid Bolus Normal Saline  Bolus Amount (mL) 250 mL  Intra-Hemodialysis Comments Tx completed;Tolerated well

## 2018-09-09 NOTE — Progress Notes (Signed)
[facility]  ROUNDING NOTE   Subjective:   Came to the ED with shortness of breath, peripheral edema. Last hemodialysis was Monday, 7/6    HEMODIALYSIS FLOWSHEET:  Blood Flow Rate (mL/min): 400 mL/min Arterial Pressure (mmHg): -220 mmHg Venous Pressure (mmHg): 150 mmHg Transmembrane Pressure (mmHg): 60 mmHg    Objective:  Vital signs in last 24 hours:  Temp:  [98 F (36.7 C)] 98 F (36.7 C) (07/10 1016) Pulse Rate:  [72-84] 72 (07/10 1016) Resp:  [16-17] 16 (07/10 1016) BP: (170-195)/(116-147) 172/116 (07/10 1130) SpO2:  [100 %] 100 % (07/10 1016) Weight:  [99.7 kg-99.8 kg] 99.7 kg (07/10 1016)  Weight change:  Filed Weights   09/09/18 0911 09/09/18 1016  Weight: 99.8 kg 99.7 kg    Intake/Output: No intake/output data recorded.   Intake/Output this shift:  No intake/output data recorded.  Physical Exam: General: NAD,   Head: Normocephalic, atraumatic. Moist oral mucosal membranes  Eyes: Anicteric, PERRL  Neck: Supple, trachea midline  Lungs:  Clear to auscultation  Heart: Regular rate and rhythm  Abdomen:  +right inguinal hernia  Extremities:  ++ peripheral edema.  Neurologic: Nonfocal, moving all four extremities  Skin: No lesions  Access: RIJ permcath    Basic Metabolic Panel: Recent Labs  Lab 09/09/18 1039  NA 140  K 6.5*  CL 104  CO2 20*  GLUCOSE 89  BUN 77*  CREATININE 18.34*  CALCIUM 8.7*  PHOS 7.5*    Liver Function Tests: Recent Labs  Lab 09/09/18 1039  ALBUMIN 3.3*   No results for input(s): LIPASE, AMYLASE in the last 168 hours. No results for input(s): AMMONIA in the last 168 hours.  CBC: Recent Labs  Lab 09/09/18 1039  WBC 5.1  HGB 11.0*  HCT 34.5*  MCV 92.0  PLT 218    Cardiac Enzymes: No results for input(s): CKTOTAL, CKMB, CKMBINDEX, TROPONINI in the last 168 hours.  BNP: Invalid input(s): POCBNP  CBG: No results for input(s): GLUCAP in the last 168 hours.  Microbiology: Results for orders  placed or performed during the hospital encounter of 04/08/18  MRSA PCR Screening     Status: None   Collection Time: 04/09/18  4:03 PM   Specimen: Nasal Mucosa; Nasopharyngeal  Result Value Ref Range Status   MRSA by PCR NEGATIVE NEGATIVE Final    Comment:        The GeneXpert MRSA Assay (FDA approved for NASAL specimens only), is one component of a comprehensive MRSA colonization surveillance program. It is not intended to diagnose MRSA infection nor to guide or monitor treatment for MRSA infections. Performed at [facility], [address] [place], [place] [address]     Coagulation Studies: No results for input(s): LABPROT, INR in the last 72 hours.  Urinalysis: No results for input(s): COLORURINE, LABSPEC, PHURINE, GLUCOSEU, HGBUR, BILIRUBINUR, KETONESUR, PROTEINUR, UROBILINOGEN, NITRITE, LEUKOCYTESUR in the last 72 hours.  Invalid input(s): APPERANCEUR    Imaging: No results found.   Medications:    . Chlorhexidine Gluconate Cloth  6 each Topical Q0600   ibuprofen  Assessment/ Plan:  [name] is a 48 y.o. black male with end stage renal disease on hemodialysis, hypertension, CVA, sleep apnea, coronary artery disease, COPD, anti-social personality disorder.  No assigned dialysis unit available  1. Shortness of breath 2. End Stage Renal Disease with hyperkalemia 3. Anemia of chronic kidney disease 4. Secondary hyperparathyroidism 5. Hypertension  Hemodialysis treatment today. 2K bath. UF goal of 3 liters.  LOS: 0 Bryah [place] 7/10/20201:32 PM

## 2018-09-09 NOTE — Progress Notes (Signed)
Post HD [facility]    09/09/18 1350  Hand-Off documentation  Report given to (Full Name) ED charge RN   Report received from (Full Name) Beatris Ship, RN   Vital Signs  Temp 98 F (36.7 C)  Temp Source Oral  Pulse Rate 77  Pulse Rate Source Monitor  Resp 16  BP (!) 168/103  BP Location Right Arm  BP Method Automatic  Patient Position (if appropriate) Sitting  Oxygen Therapy  SpO2 100 %  O2 Device Room Air  Pulse Oximetry Type Continuous  Pain Assessment  Pain Scale 0-10  Pain Score 0  Dialysis Weight  Weight 97.2 kg  Type of Weight Post-Dialysis  Post-Hemodialysis Assessment  Rinseback Volume (mL) 250 mL  Dialyzer Clearance Lightly streaked  Duration of HD Treatment -hour(s) 3.5 hour(s)  Hemodialysis Intake (mL) 500 mL  UF Total -Machine (mL) 3500 mL  Net UF (mL) 3000 mL  Tolerated HD Treatment Yes

## 2018-09-09 NOTE — ED Provider Notes (Signed)
[facility] Emergency Department Provider Note   ____________________________________________   First MD Initiated Contact with Patient 09/09/18 [phone]     (approximate)  I have reviewed the triage vital signs and the nursing notes.   HISTORY  Chief Complaint Dialysis   HPI Fernando Salas is a 48 y.o. male patient missed dialysis last time he is short of breath and has increasing leg swelling so he came in for dialysis today.  He has no other complaints except for that there is cold in the hospital.         Past Medical History:  Diagnosis Date  . CAD (coronary artery disease)    status post multiple myocardial infarctions  . COPD (chronic obstructive pulmonary disease) ([place])   . ESRD (end stage renal disease) ([place])   . Hyperlipidemia   . Hypertension   . Myocardial infarction ([place])    " I had 13 heart attacks, 8 massive"  . Proteinuria   . Renal insufficiency   . Secondary hyperparathyroidism of renal origin ([place])   . Shortness of breath dyspnea    " when I drink a cup of cold water too fast"  . Sleep apnea    pt stated " long story" does not wear CPAP  . Stroke [facility])     Patient Active Problem List   Diagnosis Date Noted  . ESRD (end stage renal disease) on dialysis ([place]) 06/27/2018  . Hypertensive urgency, malignant 04/08/2018  . Hypertensive urgency 03/25/2018  . Pulmonary edema 03/09/2017  . 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 Fluid overload 02/08/2017  . Accelerated hypertension 01/12/2017  . AKI (acute kidney injury) ([place])   . Unstable angina ([place]) 11/01/2016    Past Surgical History:  Procedure Laterality Date  . AV FISTULA PLACEMENT Left 01-24-14   By Dr. Hortencia Pilar in [place]  . AV FISTULA PLACEMENT Right 04/30/2014   Procedure: RIGHT BRACHIOCEPHALIC ARTERIOVENOUS (AV) FISTULA CREATION;  Surgeon: Elam Dutch, MD;  Location: [facility] OR;  Service: Vascular;  Laterality: Right;  . AV FISTULA PLACEMENT Left  05/31/2014   Procedure: Left INSERTION OF ARTERIOVENOUS (AV) GORE-TEX GRAFT ARM;  Surgeon: Elam Dutch, MD;  Location: [place];  Service: Vascular;  Laterality: Left;  . CARDIAC CATHETERIZATION    . CORONARY STENT PLACEMENT     13 stents total  . DIALYSIS/PERMA CATHETER INSERTION N/A 01/15/2017   Procedure: DIALYSIS/PERMA CATHETER INSERTION;  Surgeon: Katha Cabal, MD;  Location: [place] CV LAB;  Service: Cardiovascular;  Laterality: N/A;    Prior to Admission medications   Medication Sig Start Date End Date Taking? Authorizing Provider  amLODipine (NORVASC) 10 MG tablet Take 1 tablet (10 mg total) by mouth daily. 05/01/17 07/01/18  Lisa Roca, MD  atorvastatin (LIPITOR) 40 MG tablet Take 1 tablet (40 mg total) by mouth daily. 11/03/16   Demetrios Loll, MD  calcium acetate (PHOSLO) 667 MG capsule Take 1,334 mg by mouth 3 (three) times daily with meals. 12/03/17 12/03/18  [provider]  carvedilol (COREG) 25 MG tablet Take 1 tablet (25 mg total) by mouth 2 (two) times daily. 05/01/17 07/01/18  Lisa Roca, MD  clopidogrel (PLAVIX) 75 MG tablet Take 1 tablet (75 mg total) by mouth daily. 11/03/16   Demetrios Loll, MD  furosemide (LASIX) 80 MG tablet Take 80 mg by mouth See admin instructions. Take 1 tablet (80mg ) every morning on non-dialysis days 05/16/18   [provider]  hydrALAZINE (APRESOLINE) 100 MG tablet Take 1 tablet (  100 mg total) by mouth 4 (four) times daily. Patient taking differently: Take 100 mg by mouth every 8 (eight) hours.  05/01/17 07/01/18  Lisa Roca, MD  isosorbide dinitrate (ISORDIL) 30 MG tablet Take 30 mg by mouth 3 (three) times daily.     [provider]    Allergies Ibuprofen, Viagra [sildenafil], and Clonidine derivatives  Family History  Problem Relation Age of Onset  . Heart disease Mother   . Hyperlipidemia Mother   . Hypertension Mother   . Kidney disease Father   . Kidney disease Brother   . Hyperlipidemia Sister   .  Hyperlipidemia Daughter   . Hypertension Sister   . Hypertension Daughter     Social History Social History   Tobacco Use  . Smoking status: Current Every Day Smoker    Packs/day: 0.25    Years: 25.00    Pack years: 6.25    Types: Cigarettes  . Smokeless tobacco: Never Used  Substance Use Topics  . Alcohol use: No    Alcohol/week: 0.0 standard drinks    Frequency: Never    Comment: occasional  . Drug use: Not Currently    Types: Marijuana    Comment: daily    Review of Systems  Constitutional: No fever/chills Eyes: No visual changes. ENT: No sore throat. Cardiovascular: Denies chest pain. Respiratory:  shortness of breath. Gastrointestinal: No abdominal pain.  No nausea, no vomiting.  No diarrhea.  No constipation. Genitourinary: Negative for dysuria. Musculoskeletal: Negative for back pain. Skin: Negative for rash. Neurological: Negative for headaches, focal weakness  ____________________________________________   PHYSICAL EXAM:  VITAL SIGNS: ED Triage Vitals  Enc Vitals Group     BP 09/09/18 0912 (!) 195/147     Pulse Rate 09/09/18 0912 84     Resp 09/09/18 0912 17     Temp 09/09/18 0912 98 F (36.7 C)     Temp src --      SpO2 09/09/18 0912 100 %     Weight 09/09/18 0911 220 lb (99.8 kg)     Height 09/09/18 0911 6' (1.829 m)     Head Circumference --      Peak Flow --      Pain Score 09/09/18 0911 0     Pain Loc --      Pain Edu? --      Excl. in [place]? --     Constitutional: Alert and oriented. Well appearing and in no acute distress. Eyes: Conjunctivae are normal.  Head: Atraumatic. Nose: No congestion/rhinnorhea. Mouth/Throat: Mucous membranes are moist.  Oropharynx non-erythematous. Neck: No stridor.  Cardiovascular: Normal rate, regular rhythm. Grossly normal heart sounds.  Good peripheral circulation. Respiratory: Normal respiratory effort.  No retractions. Lungs slight crackles in bases Gastrointestinal: Soft and nontender. No distention.  No abdominal bruits. No CVA tenderness. Musculoskeletal: No lower extremity tenderness bilateral 2+ edema.   Neurologic:  Normal speech and language. No gross focal neurologic deficits are appreciated. No gait instability. Skin:  Skin is warm, dry and intact. No rash noted. Psychiatric: Mood and affect are normal. Speech and behavior are normal.  ____________________________________________   LABS (all labs ordered are listed, but only abnormal results are displayed)  Labs Reviewed - No data to display ____________________________________________  EKG   ____________________________________________  RADIOLOGY  ED MD interpretation:    Official radiology report(s): No results found.  ____________________________________________   PROCEDURES  Procedure(s) performed (including Critical Care):  Procedures   ____________________________________________   INITIAL IMPRESSION / ASSESSMENT AND PLAN /  ED COURSE  Patient will go to dialysis as soon as possible.              ____________________________________________   FINAL CLINICAL IMPRESSION(S) / ED DIAGNOSES  Final diagnoses:  End-stage renal disease needing dialysis [facility])     ED Discharge Orders    None       Note:  This document was prepared using Dragon voice recognition software and may include unintentional dictation errors.    Nena Polio, MD 09/09/18 [phone]

## 2018-09-09 NOTE — Progress Notes (Signed)
Post HD Assessment    09/09/18 1350  Neurological  Level of Consciousness Alert  Orientation Level Oriented X4  Respiratory  Respiratory Pattern Regular;Unlabored  Chest Assessment Chest expansion symmetrical  Bilateral Breath Sounds Diminished;Clear  Cough None  Cardiac  Pulse Regular  Heart Sounds S1, S2  ECG Monitor Yes  Cardiac Rhythm NSR  Vascular  R Radial Pulse +2  L Radial Pulse +2  Edema Generalized  Generalized Edema +2  Psychosocial  Psychosocial (WDL) WDL

## 2018-09-10 LAB — HEPATITIS B SURFACE ANTIGEN: Hepatitis B Surface Ag: NEGATIVE

## 2018-09-14 ENCOUNTER — Encounter: Payer: Self-pay | Admitting: *Deleted

## 2018-09-14 NOTE — Progress Notes (Signed)
I did call and follow up on cardiac clearance request with Dr. Bailey Mech office.   Their office states the patient needs to have additional testing done.   Follow up appointment is scheduled for 09-27-18 with their office.   Patient is currently scheduled to follow up with Dr. Dahlia Byes on 10-03-18 at 11:15 am.

## 2018-09-21 ENCOUNTER — Encounter: Payer: Self-pay | Admitting: Emergency Medicine

## 2018-09-21 ENCOUNTER — Other Ambulatory Visit: Payer: Self-pay

## 2018-09-21 ENCOUNTER — Emergency Department
Admission: EM | Admit: 2018-09-21 | Discharge: 2018-09-21 | Discharge status: Against Medical Advice | Payer: Medicare Other | Attending: Emergency Medicine | Admitting: Emergency Medicine

## 2018-09-21 DIAGNOSIS — Z992 Dependence on renal dialysis: Secondary | ICD-10-CM

## 2018-09-21 MED ORDER — GENERIC EXTERNAL MEDICATION
1.90 | Status: DC
Start: ? — End: 2018-09-21

## 2018-09-21 MED ORDER — HEPARIN SODIUM (PORCINE) 1000 UNIT/ML IJ SOLN
4000.00 | INTRAMUSCULAR | Status: DC
Start: ? — End: 2018-09-21

## 2018-09-21 MED ORDER — OXYCODONE-ACETAMINOPHEN 5-325 MG PO TABS
1.00 | ORAL_TABLET | ORAL | Status: DC
Start: ? — End: 2018-09-21

## 2018-09-21 MED ORDER — GENERIC EXTERNAL MEDICATION
1.80 | Status: DC
Start: ? — End: 2018-09-21

## 2018-09-21 NOTE — Discharge Instructions (Addendum)
Return to the ED in 24 hours for dialysis.

## 2018-09-21 NOTE — ED Notes (Signed)
Multiple attempts at calling dialysis with no answer

## 2018-09-21 NOTE — ED Notes (Signed)
Attempted x2 to call dialysis

## 2018-09-21 NOTE — ED Triage Notes (Signed)
Here for dialysis. Last received Monday. No symptoms. Pt has no complaints.

## 2018-09-21 NOTE — ED Provider Notes (Addendum)
[facility] Emergency Department Provider Note ____________________________________________  Time seen: 1405  I have reviewed the triage vital signs and the nursing notes.  HISTORY  Chief Complaint  needs dialysis  HPI Fernando Salas is a 48 y.o. male presents himself to the ED for dialysis treatment day.  Patient denies any significant symptoms at this time.  He denies any chest pain, shortness of breath, or peripheral edema.  He last received dialysis on Monday at [facility].   Past Medical History:  Diagnosis Date  . CAD (coronary artery disease)    status post multiple myocardial infarctions  . COPD (chronic obstructive pulmonary disease) ([place])   . ESRD (end stage renal disease) ([place])   . Hyperlipidemia   . Hypertension   . Myocardial infarction ([place])    " I had 13 heart attacks, 8 massive"  . Proteinuria   . Renal insufficiency   . Secondary hyperparathyroidism of renal origin ([place])   . Shortness of breath dyspnea    " when I drink a cup of cold water too fast"  . Sleep apnea    pt stated " long story" does not wear CPAP  . Stroke [facility])     Patient Active Problem List   Diagnosis Date Noted  . ESRD (end stage renal disease) on dialysis ([place]) 06/27/2018  . Hypertensive urgency, malignant 04/08/2018  . Hypertensive urgency 03/25/2018  . Pulmonary edema 03/09/2017  . 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 Fluid overload 02/08/2017  . Accelerated hypertension 01/12/2017  . AKI (acute kidney injury) ([place])   . Unstable angina ([place]) 11/01/2016    Past Surgical History:  Procedure Laterality Date  . AV FISTULA PLACEMENT Left 01-24-14   By Dr. Hortencia Pilar in [place]  . AV FISTULA PLACEMENT Right 04/30/2014   Procedure: RIGHT BRACHIOCEPHALIC ARTERIOVENOUS (AV) FISTULA CREATION;  Surgeon: Elam Dutch, MD;  Location: [facility] OR;  Service: Vascular;  Laterality: Right;  . AV FISTULA PLACEMENT Left 05/31/2014   Procedure: Left INSERTION OF  ARTERIOVENOUS (AV) GORE-TEX GRAFT ARM;  Surgeon: Elam Dutch, MD;  Location: [place];  Service: Vascular;  Laterality: Left;  . CARDIAC CATHETERIZATION    . CORONARY STENT PLACEMENT     13 stents total  . DIALYSIS/PERMA CATHETER INSERTION N/A 01/15/2017   Procedure: DIALYSIS/PERMA CATHETER INSERTION;  Surgeon: Katha Cabal, MD;  Location: [place] CV LAB;  Service: Cardiovascular;  Laterality: N/A;    Prior to Admission medications   Medication Sig Start Date End Date Taking? Authorizing Provider  amLODipine (NORVASC) 10 MG tablet Take 1 tablet (10 mg total) by mouth daily. 05/01/17 07/01/18  Lisa Roca, MD  atorvastatin (LIPITOR) 40 MG tablet Take 1 tablet (40 mg total) by mouth daily. 11/03/16   Demetrios Loll, MD  calcium acetate (PHOSLO) 667 MG capsule Take 1,334 mg by mouth 3 (three) times daily with meals. 12/03/17 12/03/18  [provider]  carvedilol (COREG) 25 MG tablet Take 1 tablet (25 mg total) by mouth 2 (two) times daily. 05/01/17 07/01/18  Lisa Roca, MD  clopidogrel (PLAVIX) 75 MG tablet Take 1 tablet (75 mg total) by mouth daily. 11/03/16   Demetrios Loll, MD  furosemide (LASIX) 80 MG tablet Take 80 mg by mouth See admin instructions. Take 1 tablet (80mg ) every morning on non-dialysis days 05/16/18   [provider]  hydrALAZINE (APRESOLINE) 100 MG tablet Take 1 tablet (100 mg total) by mouth 4 (four) times daily. Patient taking differently: Take 100 mg by mouth  every 8 (eight) hours.  05/01/17 07/01/18  Lisa Roca, MD  isosorbide dinitrate (ISORDIL) 30 MG tablet Take 30 mg by mouth 3 (three) times daily.     [provider]    Allergies Ibuprofen, Viagra [sildenafil], and Clonidine derivatives  Family History  Problem Relation Age of Onset  . Heart disease Mother   . Hyperlipidemia Mother   . Hypertension Mother   . Kidney disease Father   . Kidney disease Brother   . Hyperlipidemia Sister   . Hyperlipidemia Daughter   . Hypertension Sister    . Hypertension Daughter     Social History Social History   Tobacco Use  . Smoking status: Current Every Day Smoker    Packs/day: 0.25    Years: 25.00    Pack years: 6.25    Types: Cigarettes  . Smokeless tobacco: Never Used  Substance Use Topics  . Alcohol use: No    Alcohol/week: 0.0 standard drinks    Frequency: Never    Comment: occasional  . Drug use: Not Currently    Types: Marijuana    Comment: daily    Review of Systems  Constitutional: Negative for fever. Eyes: Negative for visual changes. ENT: Negative for sore throat. Cardiovascular: Negative for chest pain. Respiratory: Negative for shortness of breath. Gastrointestinal: Negative for abdominal pain, vomiting and diarrhea. Genitourinary: Negative for dysuria. Musculoskeletal: Negative for back pain. Skin: Negative for rash. Neurological: Negative for headaches, focal weakness or numbness. ____________________________________________  PHYSICAL EXAM:  VITAL SIGNS: ED Triage Vitals  Enc Vitals Group     BP 09/21/18 1347 (!) 166/110     Pulse Rate 09/21/18 1346 74     Resp 09/21/18 1346 16     Temp 09/21/18 1346 97.9 F (36.6 C)     Temp Source 09/21/18 1346 Oral     SpO2 09/21/18 1346 98 %     Weight 09/21/18 1347 214 lb 4.6 oz (97.2 kg)     Height 09/21/18 1347 6' (1.829 m)     Head Circumference --      Peak Flow --      Pain Score 09/21/18 1347 0     Pain Loc --      Pain Edu? --      Excl. in [place]? --     Constitutional: Alert and oriented. Well appearing and in no distress. Head: Normocephalic and atraumatic. Eyes: Conjunctivae are normal. Normal extraocular movements Cardiovascular: Normal rate, regular rhythm. Grade II systolic murmur noted Respiratory: Normal respiratory effort. No wheezes/rales/rhonchi. Gastrointestinal: Soft and nontender. No distention. Musculoskeletal: Nontender with normal range of motion in all extremities.  Neurologic:  Normal gait without ataxia. Normal speech  and language. No gross focal neurologic deficits are appreciated. Skin:  Skin is warm, dry and intact. No rash noted. ____________________________________________  PROCEDURES  Procedures ____________________________________________  INITIAL IMPRESSION / ASSESSMENT AND PLAN / ED COURSE  Fernando Salas was evaluated in Emergency Department on 09/21/2018 for the symptoms described in the history of present illness. He was evaluated in the context of the global COVID-19 pandemic, which necessitated consideration that the patient might be at risk for infection with the SARS-CoV-2 virus that causes COVID-19. Institutional protocols and algorithms that pertain to the evaluation of patients at risk for COVID-19 are in a state of rapid change based on information released by regulatory bodies including the CDC and federal and state organizations. These policies and algorithms were followed during the patient's care in the ED.  Patient with ESRD,  presents to the ED for request for dialysis.  Patient is a patient known to the ED, and routinely presents for his regular dialysis treatment.  Patient without any acute complaints at this time.  He will be discharged from the ED and transferred for dialysis.  ----------------------------------------- 2:29 PM on 09/21/2018 -----------------------------------------  L/M with Dr. Holley Raring regarding patient in ED for dialysis.   ----------------------------------------- 4:41 PM on 09/21/2018 -----------------------------------------  Patient notified my of his intentions to leave prior to dialysis. We have not been available to confirm his dialysis time with the unit. They have been unreachable by phone.   ____________________________________________  FINAL CLINICAL IMPRESSION(S) / ED DIAGNOSES  Final diagnoses:  Encounter for dialysis [facility])      Carmie End, Dannielle Karvonen, PA-C 09/21/18 [address] Dannielle Karvonen, PA-C 09/21/18 1429    [address],  Dannielle Karvonen, PA-C 09/21/18 1643    Duffy Bruce, MD 09/23/18 2325

## 2018-09-21 NOTE — ED Notes (Signed)
Patient here for dialysis treatment. Denies any pain or symptoms.

## 2018-09-21 NOTE — ED Notes (Signed)
PT expressing complaints of wait and hernia pain. EDP at bedside speaking with pt.

## 2018-09-21 NOTE — ED Notes (Signed)
Attempted to call dialysis

## 2018-09-21 NOTE — ED Notes (Signed)
Patient given apple sauce, apple juice, crackers and peanut butter. Patient asking to order a tray of food since he has not eaten any lunch today. Patient made aware he is not able to order food through cafeteria since he is not an admitted patient.

## 2018-09-28 ENCOUNTER — Encounter: Payer: Self-pay | Admitting: *Deleted

## 2018-09-28 NOTE — Progress Notes (Signed)
Per front desk staff at Dr. [place] Throckmorton Mech office, patient is actually seeing Dr. Humphrey Rolls for cardiac clearance.   Patient to have additional testing- CTA on Friday, 09-30-18.

## 2018-10-03 ENCOUNTER — Ambulatory Visit: Payer: Medicare Other | Admitting: Surgery

## 2018-10-06 ENCOUNTER — Encounter: Payer: Self-pay | Admitting: *Deleted

## 2018-10-06 NOTE — Progress Notes (Signed)
I did call Dr. Laurelyn Sickle office and they state patient was a no show for CT on 09-30-18.   He is scheduled for today to have this done.   Still awaiting cardiac clearance.

## 2018-10-10 ENCOUNTER — Telehealth: Payer: Self-pay | Admitting: *Deleted

## 2018-10-10 NOTE — Telephone Encounter (Signed)
I did call Dr. Laurelyn Sickle office who reports patient was a no show to his appointment on 10-06-18 and he has not rescheduled.   I did call the patient who did not answer and I was not able to leave a message.   Patient needs cardiac clearance prior to appointment on 10-19-18 at 9 am with Dr. Dahlia Byes.

## 2018-10-12 NOTE — Telephone Encounter (Signed)
I did call and speak with the patient this morning about appointment that was supposed to be on 10-06-18 with Dr. Laurelyn Sickle office for cardiac clearance.   The patient states that this was rescheduled for 10-11-18. Patient was confused on the dates and did not go yesterday.   Patient was instructed to call Dr. Laurelyn Sickle office to get his appointment rescheduled. He is aware we will need clearance prior to appointment with Dr. Dahlia Byes on 10-19-18. Patient aware if he does not have clearance prior then we will need to reschedule. He verbalizes understanding.

## 2018-10-17 ENCOUNTER — Telehealth: Payer: Self-pay | Admitting: *Deleted

## 2018-10-17 NOTE — Telephone Encounter (Signed)
Per patient he is on the way to be seen by Dr. Laurelyn Sickle office regarding cardiac clearance right now.   Patient is aware if he does not get clearance that he will need to reschedule appointment with Dr. Dahlia Byes on 10-19-18.

## 2018-10-18 NOTE — Telephone Encounter (Signed)
I Spoke with Fernando Salas at Dr April Manson office and the patient was seen yesterday. He has a CTA scheduled for 10/19/18 and then a follow up with Dr Humphrey Rolls on 10/28/18. They will not give clearance until seen on 10/28/18. The patient will need to be rescheduled to see Dr Dahlia Byes for after 10/28/18.  I have attempted to contact the patient to let him know we need to reschedule him, but am unable to leave a message due to his voicemail not being set up.

## 2018-10-19 ENCOUNTER — Ambulatory Visit: Payer: Medicare Other | Admitting: Surgery

## 2018-10-19 NOTE — Telephone Encounter (Signed)
I also tried to call the patient this morning but no answer and not able to leave a message.

## 2018-10-19 NOTE — Telephone Encounter (Signed)
Office appointment rescheduled for 10-31-18 at 9 am. Patient aware.

## 2018-10-21 ENCOUNTER — Other Ambulatory Visit: Payer: Self-pay

## 2018-10-21 ENCOUNTER — Emergency Department
Admission: EM | Admit: 2018-10-21 | Discharge: 2018-10-21 | Disposition: A | Discharge status: Home / Self Care | Payer: Medicare Other | Attending: Emergency Medicine | Admitting: Emergency Medicine

## 2018-10-21 DIAGNOSIS — Z7902 Long term (current) use of antithrombotics/antiplatelets: Secondary | ICD-10-CM | POA: Insufficient documentation

## 2018-10-21 DIAGNOSIS — F17210 Nicotine dependence, cigarettes, uncomplicated: Secondary | ICD-10-CM | POA: Diagnosis not present

## 2018-10-21 DIAGNOSIS — Z992 Dependence on renal dialysis: Secondary | ICD-10-CM | POA: Diagnosis not present

## 2018-10-21 DIAGNOSIS — J449 Chronic obstructive pulmonary disease, unspecified: Secondary | ICD-10-CM | POA: Diagnosis not present

## 2018-10-21 DIAGNOSIS — I25110 Atherosclerotic heart disease of native coronary artery with unstable angina pectoris: Secondary | ICD-10-CM | POA: Insufficient documentation

## 2018-10-21 DIAGNOSIS — Z955 Presence of coronary angioplasty implant and graft: Secondary | ICD-10-CM | POA: Diagnosis not present

## 2018-10-21 DIAGNOSIS — Z79899 Other long term (current) drug therapy: Secondary | ICD-10-CM | POA: Insufficient documentation

## 2018-10-21 DIAGNOSIS — Z20828 Contact with and (suspected) exposure to other viral communicable diseases: Secondary | ICD-10-CM | POA: Insufficient documentation

## 2018-10-21 DIAGNOSIS — N186 End stage renal disease: Secondary | ICD-10-CM | POA: Diagnosis present

## 2018-10-21 DIAGNOSIS — I120 Hypertensive chronic kidney disease with stage 5 chronic kidney disease or end stage renal disease: Secondary | ICD-10-CM | POA: Insufficient documentation

## 2018-10-21 DIAGNOSIS — Z8673 Personal history of transient ischemic attack (TIA), and cerebral infarction without residual deficits: Secondary | ICD-10-CM | POA: Diagnosis not present

## 2018-10-21 LAB — SARS CORONAVIRUS 2 BY RT PCR (HOSPITAL ORDER, PERFORMED IN ~~LOC~~ HOSPITAL LAB): SARS Coronavirus 2: NEGATIVE

## 2018-10-21 NOTE — ED Notes (Signed)
Dialysis requesting second CoVid swab with a better collection.  Pt refuses and walks from department cussing at staff.

## 2018-10-21 NOTE — ED Notes (Signed)
Pt uncooperative with nasopharyngeal swab.  Suboptimal specimen collected.

## 2018-10-21 NOTE — ED Triage Notes (Addendum)
Pt comes via POV for dialysis treatment. Pt states he does dialysis M,W, F. Pt denies any complaints at this time.  Pt states appointment at 11:30.

## 2018-10-21 NOTE — ED Provider Notes (Signed)
[facility] Emergency Department Provider Note  ____________________________________________  Time seen: Approximately 11:09 AM  I have reviewed the triage vital signs and the nursing notes.   HISTORY  Chief Complaint dialysis   HPI Fernando Salas is a 48 y.o. male who presents to the emergency department for dialysis. He denies complaints today. Last dialysis was Wednesday at [facility].   Past Medical History:  Diagnosis Date  . CAD (coronary artery disease)    status post multiple myocardial infarctions  . COPD (chronic obstructive pulmonary disease) ([place])   . ESRD (end stage renal disease) ([place])   . Hyperlipidemia   . Hypertension   . Myocardial infarction ([place])    " I had 13 heart attacks, 8 massive"  . Proteinuria   . Renal insufficiency   . Secondary hyperparathyroidism of renal origin ([place])   . Shortness of breath dyspnea    " when I drink a cup of cold water too fast"  . Sleep apnea    pt stated " long story" does not wear CPAP  . Stroke [facility])     Patient Active Problem List   Diagnosis Date Noted  . ESRD (end stage renal disease) on dialysis ([place]) 06/27/2018  . Hypertensive urgency, malignant 04/08/2018  . Hypertensive urgency 03/25/2018  . Pulmonary edema 03/09/2017  . 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 Fluid overload 02/08/2017  . Accelerated hypertension 01/12/2017  . AKI (acute kidney injury) ([place])   . Unstable angina ([place]) 11/01/2016    Past Surgical History:  Procedure Laterality Date  . AV FISTULA PLACEMENT Left 01-24-14   By Dr. Hortencia Pilar in [place]  . AV FISTULA PLACEMENT Right 04/30/2014   Procedure: RIGHT BRACHIOCEPHALIC ARTERIOVENOUS (AV) FISTULA CREATION;  Surgeon: Elam Dutch, MD;  Location: [facility] OR;  Service: Vascular;  Laterality: Right;  . AV FISTULA PLACEMENT Left 05/31/2014   Procedure: Left INSERTION OF ARTERIOVENOUS (AV) GORE-TEX GRAFT ARM;  Surgeon: Elam Dutch, MD;  Location: [place];  Service:  Vascular;  Laterality: Left;  . CARDIAC CATHETERIZATION    . CORONARY STENT PLACEMENT     13 stents total  . DIALYSIS/PERMA CATHETER INSERTION N/A 01/15/2017   Procedure: DIALYSIS/PERMA CATHETER INSERTION;  Surgeon: Katha Cabal, MD;  Location: [place] CV LAB;  Service: Cardiovascular;  Laterality: N/A;    Prior to Admission medications   Medication Sig Start Date End Date Taking? Authorizing Provider  amLODipine (NORVASC) 10 MG tablet Take 1 tablet (10 mg total) by mouth daily. 05/01/17 07/01/18  Lisa Roca, MD  atorvastatin (LIPITOR) 40 MG tablet Take 1 tablet (40 mg total) by mouth daily. 11/03/16   Demetrios Loll, MD  calcium acetate (PHOSLO) 667 MG capsule Take 1,334 mg by mouth 3 (three) times daily with meals. 12/03/17 12/03/18  [provider]  carvedilol (COREG) 25 MG tablet Take 1 tablet (25 mg total) by mouth 2 (two) times daily. 05/01/17 07/01/18  Lisa Roca, MD  clopidogrel (PLAVIX) 75 MG tablet Take 1 tablet (75 mg total) by mouth daily. 11/03/16   Demetrios Loll, MD  furosemide (LASIX) 80 MG tablet Take 80 mg by mouth See admin instructions. Take 1 tablet (80mg ) every morning on non-dialysis days 05/16/18   [provider]  hydrALAZINE (APRESOLINE) 100 MG tablet Take 1 tablet (100 mg total) by mouth 4 (four) times daily. Patient taking differently: Take 100 mg by mouth every 8 (eight) hours.  05/01/17 07/01/18  Lisa Roca, MD  isosorbide dinitrate (ISORDIL) 30 MG tablet Take  30 mg by mouth 3 (three) times daily.     [provider]    Allergies Ibuprofen, Viagra [sildenafil], and Clonidine derivatives  Family History  Problem Relation Age of Onset  . Heart disease Mother   . Hyperlipidemia Mother   . Hypertension Mother   . Kidney disease Father   . Kidney disease Brother   . Hyperlipidemia Sister   . Hyperlipidemia Daughter   . Hypertension Sister   . Hypertension Daughter     Social History Social History   Tobacco Use  . Smoking status:  Current Every Day Smoker    Packs/day: 0.25    Years: 25.00    Pack years: 6.25    Types: Cigarettes  . Smokeless tobacco: Never Used  Substance Use Topics  . Alcohol use: No    Alcohol/week: 0.0 standard drinks    Frequency: Never    Comment: occasional  . Drug use: Not Currently    Types: Marijuana    Comment: daily    Review of Systems Constitutional: Negative for fever. ENT: Negative for sore throat. Respiratory: Negative for shortness of breath or cough.  Gastrointestinal: No abdominal pain.  No nausea, no vomiting.  No diarrhea.  Musculoskeletal: Negative for generalized body aches. Skin: Negative for rash/lesion/wound. Neurological: Negative for headaches, focal weakness or numbness.  ____________________________________________   PHYSICAL EXAM:  VITAL SIGNS: ED Triage Vitals  Enc Vitals Group     BP 10/21/18 1025 (!) 171/106     Pulse Rate 10/21/18 1025 69     Resp 10/21/18 1025 18     Temp 10/21/18 1025 (!) 97.5 F (36.4 C)     Temp Source 10/21/18 1025 Oral     SpO2 10/21/18 1025 100 %     Weight --      Height --      Head Circumference --      Peak Flow --      Pain Score 10/21/18 1029 0     Pain Loc --      Pain Edu? --      Excl. in [place]? --     Constitutional: Alert and oriented. Well appearing and in no acute distress. Eyes: Conjunctivae are normal. PERRL. EOMI. Head: Atraumatic. Nose: No congestion/rhinnorhea. Mouth/Throat: Mucous membranes are moist. Neck: No stridor.  Cardiovascular: Normal rate, regular rhythm. Good peripheral circulation. Respiratory: Normal respiratory effort. Musculoskeletal: Full ROM throughout.  Neurologic:  Normal speech and language. No gross focal neurologic deficits are appreciated. Speech is normal. No gait instability. Skin:  Skin is warm, dry and intact. No rash noted. Psychiatric: Mood and affect are normal. Speech and behavior are normal.  ____________________________________________   LABS (all labs  ordered are listed, but only abnormal results are displayed)  Labs Reviewed  SARS CORONAVIRUS 2 (HOSPITAL ORDER, [place] [facility])  SARS CORONAVIRUS 2 (HOSPITAL ORDER, PERFORMED IN [place] [facility])   ____________________________________________  EKG  Not indicated. ____________________________________________  RADIOLOGY  Not indicated. ____________________________________________   PROCEDURES  None ____________________________________________   INITIAL IMPRESSION / [place] / ED COURSE  48 year old male presenting to the emergency department for dialysis.  He states he has an appointment at 1130.  He has not had COVID testing in several weeks.  Plan will be to do a COVID test and then contact dialysis.  ----------------------------------------- 1:36 PM on 10/21/2018 -----------------------------------------  Dialysis nurse, Jonelle Sidle, notified of negative COVID-19 test. She states they will call for him in 70min-1 hour.  -----------------------------------------  2:40 PM on 10/21/2018 -----------------------------------------  Dr. Juleen China called in regards to Mr. Jewitt.  According to the nursing documentation, the patient was not cooperative with the COVID 19 test and he is concerned that the results may not be accurate. Dr. Juleen China request that the test be re-collected and sent again.  ----------------------------------------- 2:53 PM on 10/21/2018 -----------------------------------------  Patient angry that repeat testing is ordered. He states he will go to [facility]. He left the department.     Pertinent labs & imaging results that were available during my care of the patient were reviewed by me and considered in my medical decision making (see chart for details). ____________________________________________   FINAL CLINICAL IMPRESSION(S) / ED DIAGNOSES  Final diagnoses:  ESRD (end stage renal disease) on  dialysis [facility])       Victorino Dike, FNP 10/21/18 1612    Schuyler Amor, MD 10/28/18 1215

## 2018-10-26 MED ORDER — Medication
2000.00 | Status: DC
Start: ? — End: 2018-10-26

## 2018-10-26 MED ORDER — UPSPRINGBABY MULTIVITAMIN/IRON PO LIQD
6000.00 | ORAL | Status: DC
Start: ? — End: 2018-10-26

## 2018-10-31 ENCOUNTER — Encounter
Admission: RE | Admit: 2018-10-31 | Discharge: 2018-10-31 | Disposition: A | Discharge status: Home / Self Care | Payer: Medicare Other | Source: Ambulatory Visit | Attending: Surgery | Admitting: Surgery

## 2018-10-31 ENCOUNTER — Other Ambulatory Visit
Admission: RE | Admit: 2018-10-31 | Discharge: 2018-10-31 | Disposition: A | Discharge status: Home / Self Care | Payer: Medicare Other | Source: Ambulatory Visit | Attending: Surgery | Admitting: Surgery

## 2018-10-31 ENCOUNTER — Ambulatory Visit (INDEPENDENT_AMBULATORY_CARE_PROVIDER_SITE_OTHER): Payer: Medicare Other | Admitting: Surgery

## 2018-10-31 ENCOUNTER — Encounter: Payer: Self-pay | Admitting: Surgery

## 2018-10-31 ENCOUNTER — Encounter: Payer: Self-pay | Admitting: *Deleted

## 2018-10-31 ENCOUNTER — Other Ambulatory Visit: Payer: Self-pay

## 2018-10-31 ENCOUNTER — Telehealth: Payer: Self-pay

## 2018-10-31 ENCOUNTER — Telehealth: Payer: Self-pay | Admitting: *Deleted

## 2018-10-31 ENCOUNTER — Encounter: Payer: Self-pay | Admitting: Emergency Medicine

## 2018-10-31 ENCOUNTER — Ambulatory Visit: Payer: Medicare Other | Admitting: Surgery

## 2018-10-31 ENCOUNTER — Emergency Department
Admission: EM | Admit: 2018-10-31 | Discharge: 2018-10-31 | Disposition: A | Discharge status: Home / Self Care | Payer: Medicare Other | Source: Home / Self Care | Attending: Emergency Medicine | Admitting: Emergency Medicine

## 2018-10-31 VITALS — BP 159/109 | HR 82 | Temp 97.7°F | Resp 16 | Ht 72.0 in | Wt 187.4 lb

## 2018-10-31 DIAGNOSIS — K4091 Unilateral inguinal hernia, without obstruction or gangrene, recurrent: Secondary | ICD-10-CM

## 2018-10-31 DIAGNOSIS — I120 Hypertensive chronic kidney disease with stage 5 chronic kidney disease or end stage renal disease: Secondary | ICD-10-CM | POA: Insufficient documentation

## 2018-10-31 DIAGNOSIS — I2510 Atherosclerotic heart disease of native coronary artery without angina pectoris: Secondary | ICD-10-CM | POA: Insufficient documentation

## 2018-10-31 DIAGNOSIS — Z79899 Other long term (current) drug therapy: Secondary | ICD-10-CM | POA: Insufficient documentation

## 2018-10-31 DIAGNOSIS — Z992 Dependence on renal dialysis: Secondary | ICD-10-CM | POA: Insufficient documentation

## 2018-10-31 DIAGNOSIS — Z5329 Procedure and treatment not carried out because of patient's decision for other reasons: Secondary | ICD-10-CM

## 2018-10-31 DIAGNOSIS — Z01812 Encounter for preprocedural laboratory examination: Secondary | ICD-10-CM | POA: Diagnosis not present

## 2018-10-31 DIAGNOSIS — F17210 Nicotine dependence, cigarettes, uncomplicated: Secondary | ICD-10-CM | POA: Insufficient documentation

## 2018-10-31 DIAGNOSIS — J449 Chronic obstructive pulmonary disease, unspecified: Secondary | ICD-10-CM | POA: Insufficient documentation

## 2018-10-31 DIAGNOSIS — N186 End stage renal disease: Secondary | ICD-10-CM | POA: Insufficient documentation

## 2018-10-31 DIAGNOSIS — I132 Hypertensive heart and chronic kidney disease with heart failure and with stage 5 chronic kidney disease, or end stage renal disease: Secondary | ICD-10-CM | POA: Diagnosis not present

## 2018-10-31 LAB — SARS CORONAVIRUS 2 (TAT 6-24 HRS): SARS Coronavirus 2: NEGATIVE

## 2018-10-31 MED ORDER — HEPARIN SODIUM (PORCINE) 1000 UNIT/ML IJ SOLN
3000.00 | INTRAMUSCULAR | Status: DC
Start: ? — End: 2018-10-31

## 2018-10-31 MED ORDER — GENERIC EXTERNAL MEDICATION
2.00 | Status: DC
Start: ? — End: 2018-10-31

## 2018-10-31 MED ORDER — ALBUMIN HUMAN 25 % IV SOLN
25.00 | INTRAVENOUS | Status: DC
Start: ? — End: 2018-10-31

## 2018-10-31 MED ORDER — EPOETIN ALFA-EPBX 4000 UNIT/ML IJ SOLN
4000.00 | INTRAMUSCULAR | Status: DC
Start: ? — End: 2018-10-31

## 2018-10-31 NOTE — Progress Notes (Signed)
Outpatient Surgical Follow Up  10/31/2018  Fernando Salas is an 48 y.o. male.   Chief Complaint  Patient presents with  . Pre-op Exam    Discuss surgery  Fernando Salas is a 48 year old male with significant history of end-stage renal disease on hemodialysis Monday Wednesday and Fridays now presents with a symptomatic right inguinal hernia.  He has intermittent pain currently is currently incarcerated.  Pain is moderate in intensity worsening will Valsalva.  Pain is also sharp.  No previous hernia surgery repairs.  He did have a CT scan that have personally reviewed couple years ago showing evidence of a right inguinal hernia.  Obviously over the last couple years he has increase in size and is symptomatic.  He did get clearance from cardiology and was advised to stop the aspirin and Plavix for 5 to 6 days.  His last time he had any aspirin or Plavix was Saturday  ( 2 days ago) and we he wishes to schedule this as soon as possible given his symptoms.   HPI:   Past Medical History:  Diagnosis Date  . CAD (coronary artery disease)    status post multiple myocardial infarctions  . COPD (chronic obstructive pulmonary disease) ([place])   . ESRD (end stage renal disease) ([place])   . Hyperlipidemia   . Hypertension   . Myocardial infarction ([place])    " I had 13 heart attacks, 8 massive"  . Proteinuria   . Renal insufficiency   . Secondary hyperparathyroidism of renal origin ([place])   . Shortness of breath dyspnea    " when I drink a cup of cold water too fast"  . Sleep apnea    pt stated " long story" does not wear CPAP  . Stroke [facility])     Past Surgical History:  Procedure Laterality Date  . AV FISTULA PLACEMENT Left 01-24-14   By Dr. Hortencia Pilar in [place]  . AV FISTULA PLACEMENT Right 04/30/2014   Procedure: RIGHT BRACHIOCEPHALIC ARTERIOVENOUS (AV) FISTULA CREATION;  Surgeon: Elam Dutch, MD;  Location: [facility] OR;  Service: Vascular;  Laterality: Right;  . AV FISTULA PLACEMENT Left  05/31/2014   Procedure: Left INSERTION OF ARTERIOVENOUS (AV) GORE-TEX GRAFT ARM;  Surgeon: Elam Dutch, MD;  Location: [place];  Service: Vascular;  Laterality: Left;  . CARDIAC CATHETERIZATION    . CORONARY STENT PLACEMENT     13 stents total  . DIALYSIS/PERMA CATHETER INSERTION N/A 01/15/2017   Procedure: DIALYSIS/PERMA CATHETER INSERTION;  Surgeon: Katha Cabal, MD;  Location: [place] [facility] LAB;  Service: Cardiovascular;  Laterality: N/A;    Family History  Problem Relation Age of Onset  . Heart disease Mother   . Hyperlipidemia Mother   . Hypertension Mother   . Kidney disease Father   . Kidney disease Brother   . Hyperlipidemia Sister   . Hyperlipidemia Daughter   . Hypertension Sister   . Hypertension Daughter     Social History:  reports that he has been smoking cigarettes. He has a 6.25 pack-year smoking history. He has never used smokeless tobacco. He reports previous drug use. Drug: Marijuana. He reports that he does not drink alcohol.  Allergies:  Allergies  Allergen Reactions  . Ibuprofen Other (See Comments)    Kidney   . Viagra [Sildenafil]     Lips swell   . Clonidine Derivatives Rash    Medications reviewed.    ROS Full ROS performed and is otherwise negative other than what is stated in  HPI   BP (!) 159/109   Pulse 82   Temp 97.7 F (36.5 C) (Temporal)   Resp 16   Ht 6' (1.829 m)   Wt 187 lb 6.4 oz (85 kg)   SpO2 98%   BMI 25.42 kg/m   Physical Exam Vitals signs and nursing note reviewed. Exam conducted with a chaperone present.  Constitutional:      General: He is not in acute distress.    Appearance: Normal appearance. He is normal weight.  Eyes:     General: No scleral icterus.       Right eye: No discharge.        Left eye: No discharge.  Neck:     Musculoskeletal: Normal range of motion and neck supple. No neck rigidity or muscular tenderness.  Cardiovascular:     Rate and Rhythm: Normal rate and regular rhythm.      Heart sounds: Normal heart sounds.  Pulmonary:     Effort: Pulmonary effort is normal. No respiratory distress.     Breath sounds: No stridor. No wheezing or rhonchi.  Abdominal:     General: Abdomen is flat. There is no distension.     Palpations: Abdomen is soft. There is no mass.     Tenderness: There is no abdominal tenderness. There is no guarding.     Hernia: A hernia is present.     Comments: Large chronically incarcerated right inguinal hernia, mildly tender to palpation.  No peritonitis.  No evidence of inguinal hernia in the left side.  He does have a calcified iliac artery on the left  Musculoskeletal: Normal range of motion.  Skin:    General: Skin is warm and dry.     Capillary Refill: Capillary refill takes less than 2 seconds.  Neurological:     General: No focal deficit present.     Mental Status: He is alert and oriented to person, place, and time.  Psychiatric:        Mood and Affect: Mood normal.        Thought Content: Thought content normal.        Judgment: Judgment normal.    Assessment/Plan: Symptomatic right inguinal hernia with crescendo symptoms in a patient with end-stage renal disease on hemodialysis as well as coronary artery disease.  He wishes to have the surgery this coming Thursday and I do think is reasonable since he has been to be off Plavix and aspirin for 5 days.  I do think that he is a good candidate for robotic surgery.  Given that we are going to do surgery on Thursday it will be optimal to have him dialyzed on Wednesday.  We will check laboratory values for Tuesday to make sure he does not need any additional potassium correction.  Procedure discussed with the patient in detail.  Risk benefit and possible complications including but not limited to: Bleeding, infection, chronic pain, injury to adjacent structures.  He understands and wishes to proceed. Greater than 50% of the 40 minutes  visit was spent in counseling/coordination of care   Caroleen Hamman, MD [place] Surgeon

## 2018-10-31 NOTE — Telephone Encounter (Signed)
Per Judeen Hammans with Anesthesia, she called patient to do a phone interview and reports that patient has not been on any medication since Saturday.   Patient was contacted and made aware that he needs to resume his regular medications with the exception of Plavix and aspirin which he will continue to hold until after surgery with Dr. Dahlia Byes on 11-03-18.  The patient will take all medications the morning of surgery with the exception of the following: Plavix, aspirin, Phoslo, and fluid pill (Lasix).   Patient verbalizes understanding of the above.

## 2018-10-31 NOTE — ED Triage Notes (Signed)
Pt states that he is here for dialysis. Denies complaints.

## 2018-10-31 NOTE — ED Provider Notes (Signed)
[facility] Emergency Department Provider Note  ____________________________________________   First MD Initiated Contact with Patient 10/31/18 1119     (approximate)  I have reviewed the triage vital signs and the nursing notes.   HISTORY  Chief Complaint Vascular Access Problem    HPI Fernando Salas is a 48 y.o. male here for dialysis.  The patient states that he is Monday Wednesday Friday dialysis.  He normally goes to the [facility] ED as he is been kicked out of multiple other dialysis centers.  He was in [place] setting up a surgery for later this week and was told to come here for dialysis because he cannot make it back to [facility].  He states he feels otherwise completely fine.  Denies any shortness of breath.  No cramping.  He has not missed any other sessions.  Noted chest pain.  No other complaints.        Past Medical History:  Diagnosis Date   CAD (coronary artery disease)    status post multiple myocardial infarctions   COPD (chronic obstructive pulmonary disease) (HCC)    ESRD (end stage renal disease) ([place])    Hyperlipidemia    Hypertension    Myocardial infarction ([place])    " I had 13 heart attacks, 8 massive"   Proteinuria    Renal insufficiency    Secondary hyperparathyroidism of renal origin ([place])    Shortness of breath dyspnea    " when I drink a cup of cold water too fast"   Sleep apnea    pt stated " long story" does not wear CPAP   Stroke [facility])     Patient Active Problem List   Diagnosis Date Noted   ESRD (end stage renal disease) on dialysis ([place]) 06/27/2018   Influenza A 04/26/2018   Hypertensive urgency, malignant 04/08/2018   Hypertensive urgency 03/25/2018   CAD (coronary artery disease) 11/05/2017   (HFpEF) heart failure with preserved ejection fraction ([place]) 06/20/2017   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 Hortencia Pilar in [place] Right 04/30/2014   Procedure: RIGHT BRACHIOCEPHALIC ARTERIOVENOUS (AV) FISTULA CREATION;  Surgeon: Elam Dutch, MD;  Location: [facility] OR;  Service: Vascular;  Laterality: Right;   AV FISTULA PLACEMENT Left 05/31/2014   Procedure: Left INSERTION OF ARTERIOVENOUS (AV) GORE-TEX GRAFT ARM;  Surgeon: Elam Dutch, MD;  Location: [place];  Service: Vascular;  Laterality: Left;   CARDIAC CATHETERIZATION     CORONARY STENT PLACEMENT     13 stents total   DIALYSIS/PERMA CATHETER INSERTION N/A 01/15/2017   Procedure: DIALYSIS/PERMA CATHETER INSERTION;  Surgeon: Katha Cabal, MD;  Location: [place] CV LAB;  Service: Cardiovascular;  Laterality: N/A;    Prior to Admission medications   Medication Sig Start Date End Date Taking? Authorizing Provider  amLODipine (NORVASC) 10 MG tablet Take 1 tablet (10 mg total) by mouth daily. 05/01/17 10/31/18  Lisa Roca, MD  atorvastatin (LIPITOR) 40 MG tablet Take 1 tablet (40 mg total) by mouth  daily. 11/03/16   Demetrios Loll, MD  calcium acetate (PHOSLO) 667 MG capsule Take 1,334 mg by mouth 3 (three) times daily with meals. 12/03/17 12/03/18  [provider]  carvedilol (COREG) 25 MG tablet Take 1 tablet (25 mg total) by mouth 2 (two) times daily. 05/01/17 10/31/18  Lisa Roca, MD  clopidogrel (PLAVIX) 75 MG tablet Take 1 tablet (75 mg total) by mouth daily. 11/03/16   Demetrios Loll, MD  furosemide (LASIX) 80 MG tablet Take 80 mg by mouth See admin instructions. Take 1 tablet (80mg ) every morning on  non-dialysis days 05/16/18   [provider]  hydrALAZINE (APRESOLINE) 100 MG tablet Take 1 tablet (100 mg total) by mouth 4 (four) times daily. Patient taking differently: Take 100 mg by mouth every 8 (eight) hours.  05/01/17 07/01/18  Lisa Roca, MD  isosorbide dinitrate (ISORDIL) 30 MG tablet Take 30 mg by mouth 3 (three) times daily.     [provider]    Allergies Ibuprofen, Viagra [sildenafil], and Clonidine derivatives  Family History  Problem Relation Age of Onset   Heart disease Mother    Hyperlipidemia Mother    Hypertension Mother    Kidney disease Father    Kidney disease Brother    Hyperlipidemia Sister    Hyperlipidemia Daughter    Hypertension Sister    Hypertension Daughter     Social History Social History   Tobacco Use   Smoking status: Current Every Day Smoker    Packs/day: 0.25    Years: 25.00    Pack years: 6.25    Types: Cigarettes   Smokeless tobacco: Never Used  Substance Use Topics   Alcohol use: No    Alcohol/week: 0.0 standard drinks    Frequency: Never    Comment: occasional   Drug use: Yes    Types: Marijuana    Comment: daily    Review of Systems  Review of Systems  Constitutional: Negative for chills and fever.  HENT: Negative for sore throat.   Respiratory: Negative for shortness of breath.   Cardiovascular: Negative for chest pain.  Gastrointestinal: Negative for abdominal pain.  Genitourinary: Negative for flank pain.  Musculoskeletal: Negative for neck pain.  Skin: Negative for rash and wound.  Allergic/Immunologic: Negative for immunocompromised state.  Neurological: Negative for weakness and numbness.  Hematological: Does not bruise/bleed easily.     ____________________________________________  PHYSICAL EXAM:      VITAL SIGNS: ED Triage Vitals  Enc Vitals Group     BP 10/31/18 1054 (!) 159/109     Pulse Rate 10/31/18 1054 79     Resp 10/31/18 1054 17     Temp 10/31/18 1054 98.4 F  (36.9 C)     Temp Source 10/31/18 1054 Oral     SpO2 10/31/18 1054 100 %     Weight 10/31/18 1050 208 lb (94.3 kg)     Height 10/31/18 1050 6' (1.829 m)     Head Circumference --      Peak Flow --      Pain Score 10/31/18 1050 0     Pain Loc --      Pain Edu? --      Excl. in [place]? --      Physical Exam Vitals signs and nursing note reviewed.  Constitutional:      General: He is not in acute distress.    Appearance: He is well-developed.  HENT:     Head: Normocephalic and atraumatic.  Eyes:  Conjunctiva/sclera: Conjunctivae normal.  Neck:     Musculoskeletal: Neck supple.  Cardiovascular:     Rate and Rhythm: Normal rate and regular rhythm.     Heart sounds: Normal heart sounds.  Pulmonary:     Effort: Pulmonary effort is normal. No respiratory distress.     Breath sounds: No wheezing.  Abdominal:     General: There is no distension.  Skin:    General: Skin is warm.     Capillary Refill: Capillary refill takes less than 2 seconds.     Findings: No rash.  Neurological:     Mental Status: He is alert and oriented to person, place, and time.     Motor: No abnormal muscle tone.       ____________________________________________   LABS (all labs ordered are listed, but only abnormal results are displayed)  Labs Reviewed  SARS CORONAVIRUS 2 (HOSPITAL ORDER, [place] LAB)  CBC WITH DIFFERENTIAL/PLATELET  BASIC METABOLIC PANEL    ____________________________________________  EKG: None ________________________________________  RADIOLOGY All imaging, including plain films, CT scans, and ultrasounds, independently reviewed by me, and interpretations confirmed via formal radiology reads.  ED MD interpretation:   None  Official radiology report(s): No results found.  ____________________________________________  PROCEDURES   Procedure(s) performed (including Critical  Care):  Procedures  ____________________________________________  INITIAL IMPRESSION / MDM / [place] / ED COURSE  As part of my medical decision making, I reviewed the following data within the electronic MEDICAL RECORD NUMBER Notes from prior ED visits and [place] Controlled Substance Database      *REHMAN GROESBECK was evaluated in Emergency Department on 10/31/2018 for the symptoms described in the history of present illness. He was evaluated in the context of the global COVID-19 pandemic, which necessitated consideration that the patient might be at risk for infection with the SARS-CoV-2 virus that causes COVID-19. Institutional protocols and algorithms that pertain to the evaluation of patients at risk for COVID-19 are in a state of rapid change based on information released by regulatory bodies including the CDC and federal and state organizations. These policies and algorithms were followed during the patient's care in the ED.  Some ED evaluations and interventions may be delayed as a result of limited staffing during the pandemic.*     Medical Decision Making: 48 year old male here for dialysis.  He states that he is completely asymptomatic.  He normally goes to [facility] ED for this.  While awaiting, I discussed with Dr. Zollie Scale of nephrology who requested COVID test.  Patient refuses testing and states that he can work on a ride to [facility].  He is hypertensive but this is baseline.  He otherwise has no apparent acute emergent pathology.  He refuses to stay for labs or EKG.  Discharged AMA.  ____________________________________________  FINAL CLINICAL IMPRESSION(S) / ED DIAGNOSES  Final diagnoses:  Left against medical advice     MEDICATIONS GIVEN DURING THIS VISIT:  Medications - No data to display   ED Discharge Orders    None       Note:  This document was prepared using Dragon voice recognition software and may include unintentional dictation errors.   Duffy Bruce,  MD 10/31/18 1504

## 2018-10-31 NOTE — Patient Instructions (Addendum)
Your procedure is scheduled on: Thu 11/03/18 Report to [place]. To find out your arrival time please call [phone] between [place] on Wed 11/02/18.  Remember: Instructions that are not followed completely may result in serious medical risk, up to and including death, or upon the discretion of your surgeon and anesthesiologist your surgery may need to be rescheduled.     _X__ 1. Do not eat food after midnight the night before your procedure.                 No gum chewing or hard candies. You may drink clear liquids up to 2 hours                 before you are scheduled to arrive for your surgery- DO not drink clear                 liquids within 2 hours of the start of your surgery.                 Clear Liquids include:  water, apple juice without pulp, clear carbohydrate                 drink such as Clearfast or Gatorade, Black Coffee or Tea (Do not add                 anything to coffee or tea). Diabetics water only  __X__2.  On the morning of surgery brush your teeth with toothpaste and water, you                 may rinse your mouth with mouthwash if you wish.  Do not swallow any              toothpaste of mouthwash.     _X__ 3.  No Alcohol for 24 hours before or after surgery.   _X__ 4.  Do Not Smoke or use e-cigarettes For 24 Hours Prior to Your Surgery.                 Do not use any chewable tobacco products for at least 6 hours prior to                 surgery.  ____  5.  Bring all medications with you on the day of surgery if instructed.   __X__  6.  Notify your doctor if there is any change in your medical condition      (cold, fever, infections).     Do not wear jewelry, make-up, hairpins, clips or nail polish. Do not wear lotions, powders, or perfumes.  Do not shave 48 hours prior to surgery. Men may shave face and neck. Do not bring valuables to the hospital.    [facility] is not responsible for any  belongings or valuables.  Contacts, dentures/partials or body piercings may not be worn into surgery. Bring a case for your contacts, glasses or hearing aids, a denture cup will be supplied. Leave your suitcase in the car. After surgery it may be brought to your room. For patients admitted to the hospital, discharge time is determined by your treatment team.   Patients discharged the day of surgery will not be allowed to drive home.   Please read over the following fact sheets that you were given:   MRSA Information  __X__ Take these medicines the morning of surgery with A SIP OF WATER:  1. amlodipine  2. atorvastatin  3. carvedilol  4. hydralazine  5. isosorbide  6.  ____ Fleet Enema (as directed)   __X__ Use CHG Soap/SAGE wipes as directed  ____ Use inhalers on the day of surgery  ____ Stop metformin/Janumet/Farxiga 2 days prior to surgery    ____ Take 1/2 of usual insulin dose the night before surgery. No insulin the morning          of surgery.   ____ Stop Blood Thinners Coumadin/Plavix/Xarelto/Pleta/Pradaxa/Eliquis/Effient/Aspirin  on   Or contact your Surgeon, Cardiologist or Medical Doctor regarding  ability to stop your blood thinners  __X__ Stop Anti-inflammatories 7 days before surgery such as Advil, Ibuprofen, Motrin,  BC or Goodies Powder, Naprosyn, Naproxen, Aleve, Aspirin    __X__ Stop all herbal supplements, fish oil or vitamin E until after surgery.    ____ Bring C-Pap to the hospital.      Telephone interview. Instructions given. Patient verbalized understanding./cn

## 2018-10-31 NOTE — Patient Instructions (Addendum)
Do not take Plavix or the Aspirin. We will schedule your surgery 11/03/2018. We will let you know when you can restart your Medications.    Inguinal Hernia, Adult An inguinal hernia is when fat or your intestines push through a weak spot in a muscle where your leg meets your lower belly (groin). This causes a rounded lump (bulge). This kind of hernia could also be:  In your scrotum, if you are male.  In folds of skin around your vagina, if you are male. There are three types of inguinal hernias. These include:  Hernias that can be pushed back into the belly (are reducible). This type rarely causes pain.  Hernias that cannot be pushed back into the belly (are incarcerated).  Hernias that cannot be pushed back into the belly and lose their blood supply (are strangulated). This type needs emergency surgery. If you do not have symptoms, you may not need treatment. If you have symptoms or a large hernia, you may need surgery. Follow these instructions at home: Lifestyle  Do these things if told by your doctor so you do not have trouble pooping (constipation): ? Drink enough fluid to keep your pee (urine) pale yellow. ? Eat foods that have a lot of fiber. These include fresh fruits and vegetables, whole grains, and beans. ? Limit foods that are high in fat and processed sugars. These include foods that are fried or sweet. ? Take medicine for trouble pooping.  Avoid lifting heavy objects.  Avoid standing for long amounts of time.  Do not use any products that contain nicotine or tobacco. These include cigarettes and e-cigarettes. If you need help quitting, ask your doctor.  Stay at a healthy weight. General instructions  You may try to push your hernia in by very gently pressing on it when you are lying down. Do not try to force the bulge back in if it will not push in easily.  Watch your hernia for any changes in shape, size, or color. Tell your doctor if you see any changes.  Take  over-the-counter and prescription medicines only as told by your doctor.  Keep all follow-up visits as told by your doctor. This is important. Contact a doctor if:  You have a fever.  You have new symptoms.  Your symptoms get worse. Get help right away if:  The area where your leg meets your lower belly has: ? Pain that gets worse suddenly. ? A bulge that gets bigger suddenly, and it does not get smaller after that. ? A bulge that turns red or purple. ? A bulge that is painful when you touch it.  You are a man, and your scrotum: ? Suddenly feels painful. ? Suddenly changes in size.  You cannot push the hernia in by very gently pressing on it when you are lying down. Do not try to force the bulge back in if it will not push in easily.  You feel sick to your stomach (nauseous), and that feeling does not go away.  You throw up (vomit), and that keeps happening.  You have a fast heartbeat.  You cannot poop (have a bowel movement) or pass gas. These symptoms may be an emergency. Do not wait to see if the symptoms will go away. Get medical help right away. Call your local emergency services (911 in the [place] Summary  An inguinal hernia is when fat or your intestines push through a weak spot in a muscle where your leg meets your lower belly (groin).  This causes a rounded lump (bulge).  If you do not have symptoms, you may not need treatment. If you have symptoms or a large hernia, you may need surgery.  Avoid lifting heavy objects. Also avoid standing for long amounts of time.  Do not try to force the bulge back in if it will not push in easily. This information is not intended to replace advice given to you by your health care provider. Make sure you discuss any questions you have with your health care provider. Document Released: 03/19/2006 Document Revised: 03/20/2017 Document Reviewed: 11/18/2016 Elsevier Patient Education  2020 [employer].

## 2018-10-31 NOTE — H&P (View-Only) (Signed)
Outpatient Surgical Follow Up  10/31/2018  Fernando Salas is an 48 y.o. male.   Chief Complaint  Patient presents with  . Pre-op Exam    Discuss surgery  Fernando Salas is a 48 year old male with significant history of end-stage renal disease on hemodialysis Monday Wednesday and Fridays now presents with a symptomatic right inguinal hernia.  He has intermittent pain currently is currently incarcerated.  Pain is moderate in intensity worsening will Valsalva.  Pain is also sharp.  No previous hernia surgery repairs.  He did have a CT scan that have personally reviewed couple years ago showing evidence of a right inguinal hernia.  Obviously over the last couple years he has increase in size and is symptomatic.  He did get clearance from cardiology and was advised to stop the aspirin and Plavix for 5 to 6 days.  His last time he had any aspirin or Plavix was Saturday  ( 2 days ago) and we he wishes to schedule this as soon as possible given his symptoms.   HPI:   Past Medical History:  Diagnosis Date  . CAD (coronary artery disease)    status post multiple myocardial infarctions  . COPD (chronic obstructive pulmonary disease) ([place])   . ESRD (end stage renal disease) ([place])   . Hyperlipidemia   . Hypertension   . Myocardial infarction ([place])    " I had 13 heart attacks, 8 massive"  . Proteinuria   . Renal insufficiency   . Secondary hyperparathyroidism of renal origin ([place])   . Shortness of breath dyspnea    " when I drink a cup of cold water too fast"  . Sleep apnea    pt stated " long story" does not wear CPAP  . Stroke [facility])     Past Surgical History:  Procedure Laterality Date  . AV FISTULA PLACEMENT Left 01-24-14   By Dr. Hortencia Pilar in [place]  . AV FISTULA PLACEMENT Right 04/30/2014   Procedure: RIGHT BRACHIOCEPHALIC ARTERIOVENOUS (AV) FISTULA CREATION;  Surgeon: Elam Dutch, MD;  Location: [facility] OR;  Service: Vascular;  Laterality: Right;  . AV FISTULA PLACEMENT Left  05/31/2014   Procedure: Left INSERTION OF ARTERIOVENOUS (AV) GORE-TEX GRAFT ARM;  Surgeon: Elam Dutch, MD;  Location: [place];  Service: Vascular;  Laterality: Left;  . CARDIAC CATHETERIZATION    . CORONARY STENT PLACEMENT     13 stents total  . DIALYSIS/PERMA CATHETER INSERTION N/A 01/15/2017   Procedure: DIALYSIS/PERMA CATHETER INSERTION;  Surgeon: Katha Cabal, MD;  Location: [place] CV LAB;  Service: Cardiovascular;  Laterality: N/A;    Family History  Problem Relation Age of Onset  . Heart disease Mother   . Hyperlipidemia Mother   . Hypertension Mother   . Kidney disease Father   . Kidney disease Brother   . Hyperlipidemia Sister   . Hyperlipidemia Daughter   . Hypertension Sister   . Hypertension Daughter     Social History:  reports that he has been smoking cigarettes. He has a 6.25 pack-year smoking history. He has never used smokeless tobacco. He reports previous drug use. Drug: Marijuana. He reports that he does not drink alcohol.  Allergies:  Allergies  Allergen Reactions  . Ibuprofen Other (See Comments)    Kidney   . Viagra [Sildenafil]     Lips swell   . Clonidine Derivatives Rash    Medications reviewed.    ROS Full ROS performed and is otherwise negative other than what is stated in  HPI   BP (!) 159/109   Pulse 82   Temp 97.7 F (36.5 C) (Temporal)   Resp 16   Ht 6' (1.829 m)   Wt 187 lb 6.4 oz (85 kg)   SpO2 98%   BMI 25.42 kg/m   Physical Exam Vitals signs and nursing note reviewed. Exam conducted with a chaperone present.  Constitutional:      General: He is not in acute distress.    Appearance: Normal appearance. He is normal weight.  Eyes:     General: No scleral icterus.       Right eye: No discharge.        Left eye: No discharge.  Neck:     Musculoskeletal: Normal range of motion and neck supple. No neck rigidity or muscular tenderness.  Cardiovascular:     Rate and Rhythm: Normal rate and regular rhythm.      Heart sounds: Normal heart sounds.  Pulmonary:     Effort: Pulmonary effort is normal. No respiratory distress.     Breath sounds: No stridor. No wheezing or rhonchi.  Abdominal:     General: Abdomen is flat. There is no distension.     Palpations: Abdomen is soft. There is no mass.     Tenderness: There is no abdominal tenderness. There is no guarding.     Hernia: A hernia is present.     Comments: Large chronically incarcerated right inguinal hernia, mildly tender to palpation.  No peritonitis.  No evidence of inguinal hernia in the left side.  He does have a calcified iliac artery on the left  Musculoskeletal: Normal range of motion.  Skin:    General: Skin is warm and dry.     Capillary Refill: Capillary refill takes less than 2 seconds.  Neurological:     General: No focal deficit present.     Mental Status: He is alert and oriented to person, place, and time.  Psychiatric:        Mood and Affect: Mood normal.        Thought Content: Thought content normal.        Judgment: Judgment normal.    Assessment/Plan: Symptomatic right inguinal hernia with crescendo symptoms in a patient with end-stage renal disease on hemodialysis as well as coronary artery disease.  He wishes to have the surgery this coming Thursday and I do think is reasonable since he has been to be off Plavix and aspirin for 5 days.  I do think that he is a good candidate for robotic surgery.  Given that we are going to do surgery on Thursday it will be optimal to have him dialyzed on Wednesday.  We will check laboratory values for Tuesday to make sure he does not need any additional potassium correction.  Procedure discussed with the patient in detail.  Risk benefit and possible complications including but not limited to: Bleeding, infection, chronic pain, injury to adjacent structures.  He understands and wishes to proceed. Greater than 50% of the 40 minutes  visit was spent in counseling/coordination of care   Caroleen Hamman, MD [place] Surgeon

## 2018-10-31 NOTE — Progress Notes (Signed)
Admitting called the office stating they needed an order for patient's lab work today.   Per Dr. Dahlia Byes, patient needs a BMP done tomorrow, 11-01-18 not today.   Order for BMP entered in Epic and patient aware to have this done tomorrow instead of today. Admitting notified accordingly.

## 2018-10-31 NOTE — Telephone Encounter (Signed)
Cardiac clearance obtained from [facility] at this time. Hold plavix /Aspirin for 6 days prior to the surgery and restart when hemodynamically stable post surgery.

## 2018-10-31 NOTE — Progress Notes (Signed)
Patient's surgery to be scheduled for 11-03-18 at [facility] with Dr. Dahlia Byes. *Case was scheduled at the request of Angie and Freda Munro. Patient aware to hold Plavix and aspirin per Freda Munro, CMA.   The patient is aware to have COVID-19 testing done on 10-31-18 at the [facility] building drive thru ([id number] [place]) between 8:00 am and 10:30 am. He is aware to isolate after, have no visitors, wash hands frequently, and avoid touching face.   The patient is aware he will be contacted by the [facility] to complete a phone interview sometime in the near future.  Patient aware to be NPO after midnight and have a driver.   He is aware to check in at the [facility] entrance where he will be screened for the coronavirus and then sent to [facility] Day Surgery.   Patient aware that he may have one visitor due to COVID-19 restrictions.   The patient verbalizes understanding of the above.   The patient is aware to call the office should he have further questions.

## 2018-10-31 NOTE — ED Notes (Addendum)
This RN attempted for IV access, explained to patient that he was being moved to [facility] for Covid test. Pt states he already had a Covid test earlier today, Per Tiffany, charge RN pt needed a rapid prior to dialysis. Pt states he is not going to be covid tested again after being tested at drive through. Pt states he is leaving and going to get his dialysis at [facility]. Pt visualized ambulatory without difficulty at this time. MD aware.

## 2018-10-31 NOTE — Addendum Note (Signed)
Addended by: Dominga Ferry on: 10/31/2018 10:38 AM   Modules accepted: Orders

## 2018-11-01 ENCOUNTER — Other Ambulatory Visit: Payer: Self-pay

## 2018-11-01 ENCOUNTER — Other Ambulatory Visit
Admission: RE | Admit: 2018-11-01 | Discharge: 2018-11-01 | Disposition: A | Discharge status: Home / Self Care | Payer: Medicare Other | Source: Ambulatory Visit | Attending: Surgery | Admitting: Surgery

## 2018-11-01 DIAGNOSIS — K4091 Unilateral inguinal hernia, without obstruction or gangrene, recurrent: Secondary | ICD-10-CM | POA: Diagnosis present

## 2018-11-01 LAB — BASIC METABOLIC PANEL
Anion gap: 19 — ABNORMAL HIGH (ref 5–15)
BUN: 71 mg/dL — ABNORMAL HIGH (ref 6–20)
CO2: 17 mmol/L — ABNORMAL LOW (ref 22–32)
Calcium: 8.9 mg/dL (ref 8.9–10.3)
Chloride: 102 mmol/L (ref 98–111)
Creatinine, Ser: 14.2 mg/dL — ABNORMAL HIGH (ref 0.61–1.24)
GFR calc Af Amer: 4 mL/min — ABNORMAL LOW (ref >60)
GFR calc non Af Amer: 4 mL/min — ABNORMAL LOW (ref >60)
Glucose, Bld: 127 mg/dL — ABNORMAL HIGH (ref 70–99)
Potassium: 5.7 mmol/L — ABNORMAL HIGH (ref 3.5–5.1)
Sodium: 138 mmol/L (ref 135–145)

## 2018-11-02 ENCOUNTER — Telehealth: Payer: Self-pay

## 2018-11-02 NOTE — Telephone Encounter (Signed)
I spoke with the patient and he states that he had dialysis done yesterday afternoon in [place] after he had his lab work done here at [facility].

## 2018-11-02 NOTE — Telephone Encounter (Signed)
Tried leaving a message for patient,however unable to leave message due to mailbox not set up.

## 2018-11-03 ENCOUNTER — Ambulatory Visit: Payer: Self-pay | Admitting: Cardiovascular Disease

## 2018-11-03 ENCOUNTER — Encounter: Payer: Self-pay | Admitting: Certified Registered"

## 2018-11-03 ENCOUNTER — Encounter
Admission: RE | Discharge status: Against Medical Advice | Payer: Self-pay | Source: Home / Self Care | Attending: Nurse Practitioner

## 2018-11-03 ENCOUNTER — Inpatient Hospital Stay
Admission: RE | Admit: 2018-11-03 | Discharge: 2018-11-04 | DRG: 291 | Discharge status: Against Medical Advice | Payer: Medicare Other | Attending: Internal Medicine | Admitting: Internal Medicine

## 2018-11-03 ENCOUNTER — Other Ambulatory Visit: Payer: Self-pay

## 2018-11-03 DIAGNOSIS — R7989 Other specified abnormal findings of blood chemistry: Secondary | ICD-10-CM | POA: Diagnosis present

## 2018-11-03 DIAGNOSIS — N2581 Secondary hyperparathyroidism of renal origin: Secondary | ICD-10-CM | POA: Diagnosis present

## 2018-11-03 DIAGNOSIS — Z8673 Personal history of transient ischemic attack (TIA), and cerebral infarction without residual deficits: Secondary | ICD-10-CM

## 2018-11-03 DIAGNOSIS — Z8249 Family history of ischemic heart disease and other diseases of the circulatory system: Secondary | ICD-10-CM | POA: Diagnosis not present

## 2018-11-03 DIAGNOSIS — I252 Old myocardial infarction: Secondary | ICD-10-CM

## 2018-11-03 DIAGNOSIS — K4030 Unilateral inguinal hernia, with obstruction, without gangrene, not specified as recurrent: Secondary | ICD-10-CM | POA: Diagnosis present

## 2018-11-03 DIAGNOSIS — N186 End stage renal disease: Secondary | ICD-10-CM | POA: Diagnosis present

## 2018-11-03 DIAGNOSIS — Z8349 Family history of other endocrine, nutritional and metabolic diseases: Secondary | ICD-10-CM | POA: Diagnosis not present

## 2018-11-03 DIAGNOSIS — Z5329 Procedure and treatment not carried out because of patient's decision for other reasons: Secondary | ICD-10-CM | POA: Diagnosis not present

## 2018-11-03 DIAGNOSIS — Z888 Allergy status to other drugs, medicaments and biological substances status: Secondary | ICD-10-CM | POA: Diagnosis not present

## 2018-11-03 DIAGNOSIS — Z841 Family history of disorders of kidney and ureter: Secondary | ICD-10-CM | POA: Diagnosis not present

## 2018-11-03 DIAGNOSIS — E785 Hyperlipidemia, unspecified: Secondary | ICD-10-CM | POA: Diagnosis present

## 2018-11-03 DIAGNOSIS — Z886 Allergy status to analgesic agent status: Secondary | ICD-10-CM

## 2018-11-03 DIAGNOSIS — F17210 Nicotine dependence, cigarettes, uncomplicated: Secondary | ICD-10-CM | POA: Diagnosis present

## 2018-11-03 DIAGNOSIS — I5032 Chronic diastolic (congestive) heart failure: Secondary | ICD-10-CM | POA: Diagnosis present

## 2018-11-03 DIAGNOSIS — G4730 Sleep apnea, unspecified: Secondary | ICD-10-CM | POA: Diagnosis present

## 2018-11-03 DIAGNOSIS — I132 Hypertensive heart and chronic kidney disease with heart failure and with stage 5 chronic kidney disease, or end stage renal disease: Secondary | ICD-10-CM | POA: Diagnosis present

## 2018-11-03 DIAGNOSIS — I2510 Atherosclerotic heart disease of native coronary artery without angina pectoris: Secondary | ICD-10-CM | POA: Diagnosis present

## 2018-11-03 DIAGNOSIS — F1290 Cannabis use, unspecified, uncomplicated: Secondary | ICD-10-CM | POA: Diagnosis present

## 2018-11-03 DIAGNOSIS — Z20828 Contact with and (suspected) exposure to other viral communicable diseases: Secondary | ICD-10-CM | POA: Diagnosis present

## 2018-11-03 DIAGNOSIS — Z9114 Patient's other noncompliance with medication regimen: Secondary | ICD-10-CM

## 2018-11-03 DIAGNOSIS — J449 Chronic obstructive pulmonary disease, unspecified: Secondary | ICD-10-CM | POA: Diagnosis present

## 2018-11-03 DIAGNOSIS — Z955 Presence of coronary angioplasty implant and graft: Secondary | ICD-10-CM

## 2018-11-03 DIAGNOSIS — Z992 Dependence on renal dialysis: Secondary | ICD-10-CM

## 2018-11-03 DIAGNOSIS — I158 Other secondary hypertension: Secondary | ICD-10-CM | POA: Diagnosis present

## 2018-11-03 DIAGNOSIS — E875 Hyperkalemia: Secondary | ICD-10-CM | POA: Diagnosis present

## 2018-11-03 DIAGNOSIS — K4091 Unilateral inguinal hernia, without obstruction or gangrene, recurrent: Secondary | ICD-10-CM

## 2018-11-03 DIAGNOSIS — R9431 Abnormal electrocardiogram [ECG] [EKG]: Secondary | ICD-10-CM

## 2018-11-03 DIAGNOSIS — Z79899 Other long term (current) drug therapy: Secondary | ICD-10-CM

## 2018-11-03 DIAGNOSIS — Z7902 Long term (current) use of antithrombotics/antiplatelets: Secondary | ICD-10-CM | POA: Diagnosis not present

## 2018-11-03 LAB — COMPREHENSIVE METABOLIC PANEL
ALT: 12 U/L (ref 0–44)
AST: 13 U/L — ABNORMAL LOW (ref 15–41)
Albumin: 3.2 g/dL — ABNORMAL LOW (ref 3.5–5.0)
Alkaline Phosphatase: 54 U/L (ref 38–126)
Anion gap: 17 — ABNORMAL HIGH (ref 5–15)
BUN: 61 mg/dL — ABNORMAL HIGH (ref 6–20)
CO2: 20 mmol/L — ABNORMAL LOW (ref 22–32)
Calcium: 9 mg/dL (ref 8.9–10.3)
Chloride: 99 mmol/L (ref 98–111)
Creatinine, Ser: 12.54 mg/dL — ABNORMAL HIGH (ref 0.61–1.24)
GFR calc Af Amer: 5 mL/min — ABNORMAL LOW (ref >60)
GFR calc non Af Amer: 4 mL/min — ABNORMAL LOW (ref >60)
Glucose, Bld: 81 mg/dL (ref 70–99)
Potassium: 6.5 mmol/L (ref 3.5–5.1)
Sodium: 136 mmol/L (ref 135–145)
Total Bilirubin: 0.8 mg/dL (ref 0.3–1.2)
Total Protein: 7.5 g/dL (ref 6.5–8.1)

## 2018-11-03 LAB — CBC WITH DIFFERENTIAL/PLATELET
Abs Immature Granulocytes: 0.03 10*3/uL (ref 0.00–0.07)
Basophils Absolute: 0 10*3/uL (ref 0.0–0.1)
Basophils Relative: 0 %
Eosinophils Absolute: 0.1 10*3/uL (ref 0.0–0.5)
Eosinophils Relative: 1 %
HCT: 42.9 % (ref 39.0–52.0)
Hemoglobin: 13.7 g/dL (ref 13.0–17.0)
Immature Granulocytes: 0 %
Lymphocytes Relative: 15 %
Lymphs Abs: 1.1 10*3/uL (ref 0.7–4.0)
MCH: 28.6 pg (ref 26.0–34.0)
MCHC: 31.9 g/dL (ref 30.0–36.0)
MCV: 89.6 fL (ref 80.0–100.0)
Monocytes Absolute: 1.2 10*3/uL — ABNORMAL HIGH (ref 0.1–1.0)
Monocytes Relative: 18 %
Neutro Abs: 4.5 10*3/uL (ref 1.7–7.7)
Neutrophils Relative %: 66 %
Platelets: 215 10*3/uL (ref 150–400)
RBC: 4.79 MIL/uL (ref 4.22–5.81)
RDW: 16.4 % — ABNORMAL HIGH (ref 11.5–15.5)
WBC: 6.9 10*3/uL (ref 4.0–10.5)
nRBC: 0 % (ref 0.0–0.2)

## 2018-11-03 LAB — POCT I-STAT 4, (NA,K, GLUC, HGB,HCT)
Glucose, Bld: 77 mg/dL (ref 70–99)
HCT: 45 % (ref 39.0–52.0)
Hemoglobin: 15.3 g/dL (ref 13.0–17.0)
Potassium: 5.4 mmol/L — ABNORMAL HIGH (ref 3.5–5.1)
Sodium: 135 mmol/L (ref 135–145)

## 2018-11-03 LAB — TROPONIN I (HIGH SENSITIVITY): Troponin I (High Sensitivity): 43 ng/L — ABNORMAL HIGH (ref <18)

## 2018-11-03 LAB — MAGNESIUM: Magnesium: 2.6 mg/dL — ABNORMAL HIGH (ref 1.7–2.4)

## 2018-11-03 SURGERY — REPAIR, HERNIA, INGUINAL, ROBOT-ASSISTED, LAPAROSCOPIC, USING MESH
Anesthesia: General

## 2018-11-03 SURGERY — LEFT HEART CATH AND CORONARY ANGIOGRAPHY
Anesthesia: Moderate Sedation | Laterality: Right

## 2018-11-03 MED ORDER — CHLORHEXIDINE GLUCONATE CLOTH 2 % EX PADS: 6.0000 | MEDICATED_PAD | Freq: Once | CUTANEOUS | Status: DC

## 2018-11-03 MED ORDER — HEPARIN SODIUM (PORCINE) 5000 UNIT/ML IJ SOLN: 5000.0000 [IU] | Freq: Three times a day (TID) | INTRAMUSCULAR | Status: DC

## 2018-11-03 MED ORDER — SODIUM BICARBONATE 8.4 % IV SOLN
50.0000 meq | Freq: Once | INTRAVENOUS | Status: AC
  Administered 2018-11-03: 50 meq via INTRAVENOUS
  Filled 2018-11-03: qty 50

## 2018-11-03 MED ORDER — CLOPIDOGREL BISULFATE 75 MG PO TABS: 75.0000 mg | ORAL_TABLET | Freq: Every day | ORAL | Status: DC

## 2018-11-03 MED ORDER — SODIUM CHLORIDE 0.9% FLUSH: 3.0000 mL | INTRAVENOUS | Status: DC | PRN

## 2018-11-03 MED ORDER — INSULIN ASPART 100 UNIT/ML IV SOLN
10.0000 [IU] | Freq: Once | INTRAVENOUS | Status: AC
  Administered 2018-11-03: 19:00:00 10 [IU] via INTRAVENOUS
  Filled 2018-11-03: qty 0.1

## 2018-11-03 MED ORDER — CARVEDILOL 25 MG PO TABS: 25.0000 mg | ORAL_TABLET | Freq: Two times a day (BID) | ORAL | Status: DC

## 2018-11-03 MED ORDER — SODIUM CHLORIDE 0.9 % WEIGHT BASED INFUSION: 1.0000 mL/kg/h | INTRAVENOUS | Status: DC

## 2018-11-03 MED ORDER — SODIUM CHLORIDE 0.9 % IV SOLN: INTRAVENOUS | Status: DC

## 2018-11-03 MED ORDER — ACETAMINOPHEN 500 MG PO TABS: 1000.0000 mg | ORAL_TABLET | ORAL | Status: DC

## 2018-11-03 MED ORDER — DEXTROSE 50 % IV SOLN
50.0000 mL | Freq: Once | INTRAVENOUS | Status: AC
  Administered 2018-11-03: 50 mL via INTRAVENOUS
  Filled 2018-11-03: qty 50

## 2018-11-03 MED ORDER — SODIUM CHLORIDE 0.9% FLUSH
3.0000 mL | Freq: Two times a day (BID) | INTRAVENOUS | Status: AC
  Filled 2018-11-03: qty 3

## 2018-11-03 MED ORDER — SODIUM ZIRCONIUM CYCLOSILICATE 10 G PO PACK
10.0000 g | PACK | Freq: Once | ORAL | Status: AC
  Administered 2018-11-03: 19:00:00 10 g via ORAL
  Filled 2018-11-03: qty 1

## 2018-11-03 MED ORDER — ALBUTEROL SULFATE (2.5 MG/3ML) 0.083% IN NEBU: 2.5000 mg | INHALATION_SOLUTION | RESPIRATORY_TRACT | Status: DC

## 2018-11-03 MED ORDER — AMLODIPINE BESYLATE 10 MG PO TABS: 10.0000 mg | ORAL_TABLET | Freq: Every day | ORAL | Status: DC

## 2018-11-03 MED ORDER — HYDRALAZINE HCL 25 MG PO TABS: 100.0000 mg | ORAL_TABLET | Freq: Three times a day (TID) | ORAL | Status: DC

## 2018-11-03 MED ORDER — MIDAZOLAM HCL 2 MG/2ML IJ SOLN
INTRAMUSCULAR | Status: AC
  Filled 2018-11-03: qty 2

## 2018-11-03 MED ORDER — PROPOFOL 10 MG/ML IV BOLUS
INTRAVENOUS | Status: AC
  Filled 2018-11-03: qty 20

## 2018-11-03 MED ORDER — FENTANYL CITRATE (PF) 100 MCG/2ML IJ SOLN
INTRAMUSCULAR | Status: AC
  Filled 2018-11-03: qty 2

## 2018-11-03 MED ORDER — CALCIUM GLUCONATE-NACL 1-0.675 GM/50ML-% IV SOLN
1.0000 g | Freq: Once | INTRAVENOUS | Status: AC
  Administered 2018-11-03: 18:00:00 1000 mg via INTRAVENOUS
  Filled 2018-11-03: qty 50

## 2018-11-03 MED ORDER — ISOSORBIDE DINITRATE 30 MG PO TABS
30.0000 mg | ORAL_TABLET | Freq: Three times a day (TID) | ORAL | Status: DC
  Filled 2018-11-03 (×5): qty 1

## 2018-11-03 MED ORDER — CEFAZOLIN SODIUM-DEXTROSE 2-4 GM/100ML-% IV SOLN: 2.0000 g | INTRAVENOUS | Status: DC

## 2018-11-03 MED ORDER — ATORVASTATIN CALCIUM 20 MG PO TABS: 40.0000 mg | ORAL_TABLET | Freq: Every day | ORAL | Status: DC

## 2018-11-03 MED ORDER — CALCIUM ACETATE (PHOS BINDER) 667 MG PO CAPS
1334.0000 mg | ORAL_CAPSULE | Freq: Three times a day (TID) | ORAL | Status: DC
  Filled 2018-11-03 (×4): qty 2

## 2018-11-03 MED ORDER — ASPIRIN 81 MG PO CHEW: 81.0000 mg | CHEWABLE_TABLET | ORAL | Status: DC

## 2018-11-03 MED ORDER — SODIUM CHLORIDE 0.9 % IV SOLN: 250.0000 mL | INTRAVENOUS | Status: DC | PRN

## 2018-11-03 MED ORDER — FAMOTIDINE 20 MG PO TABS: 20.0000 mg | ORAL_TABLET | Freq: Once | ORAL | Status: DC

## 2018-11-03 MED ORDER — HYDROMORPHONE HCL 1 MG/ML IJ SOLN
INTRAMUSCULAR | Status: AC
  Filled 2018-11-03: qty 1

## 2018-11-03 MED ORDER — GABAPENTIN 300 MG PO CAPS: 300.0000 mg | ORAL_CAPSULE | ORAL | Status: DC

## 2018-11-03 SURGICAL SUPPLY — 45 items
ADH SKN CLS APL DERMABOND .7 (GAUZE/BANDAGES/DRESSINGS) ×1
APL PRP STRL LF DISP 70% ISPRP (MISCELLANEOUS) ×1
CANISTER SUCT 1200ML W/VALVE (MISCELLANEOUS) ×3 IMPLANT
CHLORAPREP W/TINT 26 (MISCELLANEOUS) ×3 IMPLANT
COVER TIP SHEARS 8 DVNC (MISCELLANEOUS) ×1 IMPLANT
COVER TIP SHEARS 8MM DA VINCI (MISCELLANEOUS) ×2
COVER WAND RF STERILE (DRAPES) ×4 IMPLANT
DEFOGGER SCOPE WARMER CLEARIFY (MISCELLANEOUS) ×3 IMPLANT
DERMABOND ADVANCED (GAUZE/BANDAGES/DRESSINGS) ×2
DERMABOND ADVANCED .7 DNX12 (GAUZE/BANDAGES/DRESSINGS) ×1 IMPLANT
DRAPE 3/4 80X56 (DRAPES) ×3 IMPLANT
DRAPE ARM DVNC X/XI (DISPOSABLE) ×6 IMPLANT
DRAPE COLUMN DVNC XI (DISPOSABLE) ×2 IMPLANT
DRAPE DA VINCI XI ARM (DISPOSABLE) ×6
DRAPE DA VINCI XI COLUMN (DISPOSABLE) ×2
ELECT CAUTERY BLADE 6.4 (BLADE) ×4 IMPLANT
ELECT REM PT RETURN 9FT ADLT (ELECTROSURGICAL) ×3
ELECTRODE REM PT RTRN 9FT ADLT (ELECTROSURGICAL) ×2 IMPLANT
GLOVE BIO SURGEON STRL SZ7 (GLOVE) ×12 IMPLANT
GOWN STRL REUS W/ TWL LRG LVL3 (GOWN DISPOSABLE) ×8 IMPLANT
GOWN STRL REUS W/TWL LRG LVL3 (GOWN DISPOSABLE) ×12
IRRIGATION STRYKERFLOW (MISCELLANEOUS) ×1 IMPLANT
IRRIGATOR STRYKERFLOW (MISCELLANEOUS) ×3
IV NS 1000ML (IV SOLUTION)
IV NS 1000ML BAXH (IV SOLUTION) IMPLANT
KIT PINK PAD W/HEAD ARE REST (MISCELLANEOUS) ×3
KIT PINK PAD W/HEAD ARM REST (MISCELLANEOUS) ×1 IMPLANT
LABEL OR SOLS (LABEL) ×3 IMPLANT
NEEDLE HYPO 22GX1.5 SAFETY (NEEDLE) ×3 IMPLANT
OBTURATOR OPTICAL STANDARD 8MM (TROCAR) ×2
OBTURATOR OPTICAL STND 8 DVNC (TROCAR) ×1
OBTURATOR OPTICALSTD 8 DVNC (TROCAR) ×1 IMPLANT
PACK LAP CHOLECYSTECTOMY (MISCELLANEOUS) ×4 IMPLANT
PENCIL ELECTRO HAND CTR (MISCELLANEOUS) ×4 IMPLANT
SEAL CANN UNIV 5-8 DVNC XI (MISCELLANEOUS) ×3 IMPLANT
SEAL XI 5MM-8MM UNIVERSAL (MISCELLANEOUS) ×6
SOLUTION ELECTROLUBE (MISCELLANEOUS) ×3 IMPLANT
SPONGE LAP 18X18 RF (DISPOSABLE) ×4 IMPLANT
SUT MNCRL AB 4-0 PS2 18 (SUTURE) ×3 IMPLANT
SUT VIC AB 2-0 SH 27 (SUTURE) ×6
SUT VIC AB 2-0 SH 27XBRD (SUTURE) ×4 IMPLANT
SUT VICRYL 0 AB UR-6 (SUTURE) ×8 IMPLANT
SUT VLOC 90 S/L VL9 GS22 (SUTURE) ×4 IMPLANT
TROCAR BALLN GELPORT 12X130M (ENDOMECHANICALS) ×3 IMPLANT
TUBING EVAC SMOKE HEATED PNEUM (TUBING) ×4 IMPLANT

## 2018-11-03 NOTE — H&P (Signed)
[facility] [place] NAME: Fernando Salas    MR#:  [id number]  DATE OF BIRTH:  [birth year]  DATE OF ADMISSION:  11/03/2018  PRIMARY CARE PHYSICIAN: [name], MD   REQUESTING/REFERRING PHYSICIAN: Dahlia Byes, Diego MD  CHIEF COMPLAINT:  No chief complaint on file.  Right Inguinal hernia  HISTORY OF PRESENT ILLNESS:   Patient is a 48 year old male with known history of uncontrolled hypertension due to medication noncompliance, multiple MIs with PCI and stents, CAD, COPD, secondary hyperparathyroidism, CHF with grade 2 diastolic dysfunction per 123XX123 echo, CVA, ESRD on hemodialysis (Monday, Wednesday, and Friday) who presented today for surgery, with a diagnosis of chronically incarcerated right inguinal hernia. Given his ongoing chronic medical condition and requiring hemodialysis hospitalist were contacted to admit for further management.  Patient's cardiologist Dr. Neoma Laming was consulted for abnormal stress test with a history of ejection fraction 22% and perioperative evaluation.  Patient denied any chest pain or shortness of breath or orthopnea or PND or leg swelling during evaluation.  Per cardiologist Dr. Humphrey Rolls, patient was scheduled to have cardiac cath for further evaluation however patient refused cath and was sent back to his room.  Patient however report that he did not understand the procedure therefore did not want to proceed despite the procedure being explained to him by his cardiology in the OR and also to his wife who verbalized understanding.  Per cardiology he does not have any significant EKG changes, troponin is unremarkable and he is not having any symptoms of chest pain, diaphoresis or shortness of breath.    Patient states that he was dialyzed on Wednesday therefore will need dialysis tomorrow.  Of note he has been discharged from [facility] for making threats to the dialysis staff, he is also been discharged  from [facility] for similar behavior therefore will require dialysis prior to discharge tomorrow.  His labs today revealed potassium 5.4 repeat 6.5, BUN 61, creatinine 12.5, magnesium 2.6, and troponin 43.  PAST MEDICAL HISTORY:   Past Medical History:  Diagnosis Date  . CAD (coronary artery disease)    status post multiple myocardial infarctions  . COPD (chronic obstructive pulmonary disease) ([place])   . ESRD (end stage renal disease) ([place])   . Hyperlipidemia   . Hypertension   . Myocardial infarction ([place])    " I had 13 heart attacks, 8 massive"  . Proteinuria   . Renal insufficiency    patient makes no urine  . Secondary hyperparathyroidism of renal origin ([place])   . Shortness of breath dyspnea    " when I drink a cup of cold water too fast"  . Sleep apnea    pt stated " long story" does not wear CPAP  . Stroke [facility])     PAST SURGICAL HISTORY:   Past Surgical History:  Procedure Laterality Date  . AV FISTULA PLACEMENT Left 01-24-14   By Dr. Hortencia Pilar in [place]  . AV FISTULA PLACEMENT Right 04/30/2014   Procedure: RIGHT BRACHIOCEPHALIC ARTERIOVENOUS (AV) FISTULA CREATION;  Surgeon: Elam Dutch, MD;  Location: [facility] OR;  Service: Vascular;  Laterality: Right;  . AV FISTULA PLACEMENT Left 05/31/2014   Procedure: Left INSERTION OF ARTERIOVENOUS (AV) GORE-TEX GRAFT ARM;  Surgeon: Elam Dutch, MD;  Location: [facility];  Service: Vascular;  Laterality: Left;  . CARDIAC CATHETERIZATION    . CORONARY STENT PLACEMENT     13 stents total  . DIALYSIS/PERMA CATHETER INSERTION  N/A 01/15/2017   Procedure: DIALYSIS/PERMA CATHETER INSERTION;  Surgeon: Katha Cabal, MD;  Location: [facility];  Service: Cardiovascular;  Laterality: N/A;    SOCIAL HISTORY:   Social History   Tobacco Use  . Smoking status: Current Every Day Smoker    Packs/day: 0.25    Years: 25.00    Pack years: 6.25    Types: Cigarettes  . Smokeless tobacco: Never Used  Substance Use  Topics  . Alcohol use: No    Alcohol/week: 0.0 standard drinks    Frequency: Never    Comment: occasional    FAMILY HISTORY:   Family History  Problem Relation Age of Onset  . Heart disease Mother   . Hyperlipidemia Mother   . Hypertension Mother   . Kidney disease Father   . Kidney disease Brother   . Hyperlipidemia Sister   . Hyperlipidemia Daughter   . Hypertension Sister   . Hypertension Daughter     DRUG ALLERGIES:   Allergies  Allergen Reactions  . Ibuprofen Other (See Comments)    Kidney   . Viagra [Sildenafil]     Lips swell   . Clonidine Derivatives Rash    REVIEW OF SYSTEMS:   Review of Systems  Constitutional: Negative for chills, fever, malaise/fatigue and weight loss.  HENT: Negative for congestion, hearing loss and sore throat.   Eyes: Negative for blurred vision and double vision.  Respiratory: Negative for cough, shortness of breath and wheezing.   Cardiovascular: Negative for chest pain, palpitations, orthopnea and leg swelling.  Gastrointestinal: Negative for abdominal pain, diarrhea, nausea and vomiting.  Genitourinary: Negative for dysuria and urgency.       Right inguinal pain  Musculoskeletal: Negative for myalgias.  Skin: Negative for rash.  Neurological: Negative for dizziness, sensory change, speech change, focal weakness and headaches.  Psychiatric/Behavioral: Negative for depression.   MEDICATIONS AT HOME:   Prior to Admission medications   Medication Sig Start Date End Date Taking? Authorizing Provider  amLODipine (NORVASC) 10 MG tablet Take 1 tablet (10 mg total) by mouth daily. 05/01/17 11/03/18 Yes Lisa Roca, MD  atorvastatin (LIPITOR) 40 MG tablet Take 1 tablet (40 mg total) by mouth daily. 11/03/16  Yes Demetrios Loll, MD  calcium acetate (PHOSLO) 667 MG capsule Take 1,334 mg by mouth 3 (three) times daily with meals. 12/03/17 12/03/18 Yes [provider]  carvedilol (COREG) 25 MG tablet Take 1 tablet (25 mg total) by mouth 2  (two) times daily. 05/01/17 11/03/18 Yes Lisa Roca, MD  clopidogrel (PLAVIX) 75 MG tablet Take 1 tablet (75 mg total) by mouth daily. 11/03/16  Yes Demetrios Loll, MD  furosemide (LASIX) 80 MG tablet Take 80 mg by mouth See admin instructions. Take 1 tablet (80mg ) every morning on non-dialysis days 05/16/18  Yes [provider]  isosorbide dinitrate (ISORDIL) 30 MG tablet Take 30 mg by mouth 3 (three) times daily.    Yes [provider]  hydrALAZINE (APRESOLINE) 100 MG tablet Take 1 tablet (100 mg total) by mouth 4 (four) times daily. Patient taking differently: Take 100 mg by mouth every 8 (eight) hours.  05/01/17 07/01/18  Lisa Roca, MD  nitroGLYCERIN (NITROSTAT) 0.4 MG SL tablet Place 0.4 mg under the tongue as needed. 11/01/18   [provider]      VITAL SIGNS:  Blood pressure 133/83, pulse 67, temperature 98.2 F (36.8 C), temperature source Oral, resp. rate 18, height 6' (1.829 m), weight 79.9 kg, SpO2 100 %.  PHYSICAL EXAMINATION:  Physical Exam  GENERAL:  48 y.o.-year-old patient lying in the bed with no acute distress.  EYES: Pupils equal, round, reactive to light and accommodation. No scleral icterus. Extraocular muscles intact.  HEENT: Head atraumatic, normocephalic. Oropharynx and nasopharynx clear.  NECK:  Supple, no jugular venous distention. No thyroid enlargement, no tenderness.  LUNGS: Normal breath sounds bilaterally, no wheezing, rales,rhonchi or crepitation. No use of accessory muscles of respiration.  CARDIOVASCULAR: S1, S2 normal. No murmurs, rubs, or gallops.  ABDOMEN: Soft, nontender, nondistended. Bowel sounds present. No organomegaly or mass.  EXTREMITIES: No pedal edema, cyanosis, or clubbing. No rash or lesions. + pedal pulses MUSCULOSKELETAL: Normal bulk, and power was 5+ grip and elbow, knee, and ankle flexion and extension bilaterally.  NEUROLOGIC:Alert and oriented x 3. CN 2-12 intact. Sensation to light touch and cold stimuli intact  bilaterally. Gait not tested due to safety concern. PSYCHIATRIC: The patient is alert and oriented x 3.  SKIN: No obvious rash, lesion, or ulcer.   DATA REVIEWED:  LABORATORY PANEL:   CBC Recent Labs  Lab 11/03/18 1122  WBC 6.9  HGB 13.7  HCT 42.9  PLT 215   ------------------------------------------------------------------------------------------------------------------  Chemistries  Recent Labs  Lab 11/03/18 1122  NA 136  K 6.5*  CL 99  CO2 20*  GLUCOSE 81  BUN 61*  CREATININE 12.54*  CALCIUM 9.0  MG 2.6*  AST 13*  ALT 12  ALKPHOS 54  BILITOT 0.8   ------------------------------------------------------------------------------------------------------------------  Cardiac Enzymes No results for input(s): TROPONINI in the last 168 hours. ------------------------------------------------------------------------------------------------------------------  RADIOLOGY:  No results found.  EKG:  EKG: unchanged from previous tracings, sinus bradycardia, 1st degree AV block. Vent. rate 58 BPM PR interval 210 ms QRS duration 110 ms QT/QTc 470/461 ms P-R-T axes 17 0 139 IMPRESSION AND PLAN:   48 y.o. male with known history of uncontrolled hypertension due to medication noncompliance, multiple MIs with PCI and stents, CAD, COPD, secondary hyperparathyroidism, CHF with grade 2 diastolic dysfunction per 123XX123 echo, CVA, ESRD on hemodialysis (Monday, Wednesday, and Friday) who presented today for surgery, with a diagnosis of chronically incarcerated right inguinal hernia.   1. Chronically incarcerated right inguinal hernia - was scheduled to have inguinal hernia repair with mesh today pending cardiac cath. - Cardiology consulted consulted for cardiac clearance given abnormal stress test hx EF 22% - Patient refused cardiac cath, - Per cardiology will continue with medical management given he is asymptomatic if fails will require cardiac cath - Plan to follow with Dr.  Neoma Laming on Tuesday at 10:00 - Plan for surgery following cardiac clearance as above - General surgery following  2. Elevated troponin - Asymptomatic - Trend troponins  3. ESRD - on HD Monday, Wednesday Friday -Nephrology consult placed to Dr. Holley Raring -Plan for HD tomorrow  4. Hyperkalemia - Will give calcium gluconate, sodium bicarb, IV insulin, dextrose and Lokelma - Recheck in 4 hours - Recheck potassium and mag in a.m. - For HD in a.m.  5. Chronic Diastolic CHF-stable - 0000000 with grade II diastolic dysfunction per 123XX123 echo - Continue Isordil, Coreg - Continue Lasix IV diureses >1L negative per day until approach euvolemia / worsening renal function. - Low salt diet  - Strict I's and O's  6. Coronary Artery Disease  - ASA 81mg  PO daily - Clopidogrel 75mg  PO daily - Following with cardiology  7. HLD  + Goal LDL<100 - Atorvastatin 40mg  PO qhs   8. HTN  + Goal BP <130/80 - Continue amlodipine, hydralazine, and  Coreg  9. DVT prophylaxis - Heparin subq  All the records are reviewed and case discussed with ED provider. Management plans discussed with the patient, family and they are in agreement.  CODE STATUS: FULL  TOTAL TIME TAKING CARE OF THIS PATIENT: 50 minutes.    on 11/03/2018 at 5:11 PM  Rufina Falco, DNP, FNP-BC [facility] Nurse Practitioner Between 7am to 6pm - Pager [phone]  After 6pm go to [email or web address] - password [email or web address]  [facility]  Office  [phone]  CC: Primary care physician; [name], MD

## 2018-11-03 NOTE — Progress Notes (Signed)
Patient refusing any lab draws. NP [place] notified.

## 2018-11-03 NOTE — Progress Notes (Signed)
Patient refused cath and sent back. He is clically stabe, and can be discharged after dialysis. Advise follow up next Tuesday 10 am. Will sighn off.

## 2018-11-03 NOTE — Progress Notes (Signed)
Patient received for pre cath upon arrival patient refuses for staff to apply heart monitor and bp cuff to him explained that this is standard prep for a heart catheterization and he said "I have had enough of this" patient proceeded to take off his off unit  heart monitor and states he want to go somewhere else started waving hands in air exhibiting combative behavior and stated "don't touch me" patient sent back to room and dr. Humphrey Rolls notified of the above

## 2018-11-03 NOTE — OR Nursing (Signed)
Patient presented to [facility] today with systolic BP of 76 and 84 with sitting, then increased to 98 and [id number] systolic when laying down. Dr. Amie Critchley notified and ordered EKG. After review of EKG he consulted with Dr. Laurelyn Sickle cardiology and he has requested hospitalist to see patient and admit into the hospital for possible cardiac cath and dialysis. I stat potassium was 5.4 earlier today and then lab drew result of 6.5. Paged Dr. Stark Klein to give results to without response so lab value passed on to [facility] floor nurse with complete report.

## 2018-11-03 NOTE — Interval H&P Note (Signed)
History and Physical Interval Note:  11/03/2018 9:52 AM  Fernando Salas  has presented today for surgery, with the diagnosis of right inguinal hernia k40.90.  The various methods of treatment have been discussed with the patient and family. After consideration of risks, benefits and other options for treatment, the patient has consented to  Procedure(s): XI ROBOTIC [place] (Right) as a surgical intervention.  The patient's history has been reviewed, patient examined, no change in status.  I have reviewed the patient's chart and labs.  Questions were answered to the patient's satisfaction.  We will await on final decision from anesthesia to proceed.    [place]

## 2018-11-03 NOTE — Progress Notes (Signed)
I was called by hospitalist that patient did not refuse cardiac cath.  But I was told by text message nurse in holding area Fernando Salas will try to prep him for cardiac cath and he did not allow her to do preparation.  He refused to have cardiac cath and I was contacted and I decided that he can be sent upstairs to telemetry since he is refusing.  He apparently said to the hospital is nobody explained to him properly.  But I actually explained to him the procedure personally in the OR and and also explained to his wife.  He does not have any significant EKG changes and troponin is unremarkable and is not having any chest pain.  Thus patient will be treated medically and can be discharged after dialysis in the morning with follow-up with me on Tuesday at 10:00.  At that time will discuss further with him the plan as to how he wants to proceed.  He can be treated medically since he has no symptoms and based on guideline.  If he fails medical therapy then he would need cardiac catheterization.  Will sign off.

## 2018-11-03 NOTE — Anesthesia Preprocedure Evaluation (Deleted)
Anesthesia Evaluation    Airway        Dental   Pulmonary sleep apnea , COPD, Current Smoker,           Cardiovascular hypertension, + angina + CAD, + Past MI and + Cardiac Stents       Neuro/Psych PSYCHIATRIC DISORDERS CVA    GI/Hepatic   Endo/Other    Renal/GU Renal disease     Musculoskeletal   Abdominal   Peds  Hematology   Anesthesia Other Findings Past Medical History: No date: CAD (coronary artery disease)     Comment:  status post multiple myocardial infarctions No date: COPD (chronic obstructive pulmonary disease) (HCC) No date: ESRD (end stage renal disease) (HCC) No date: Hyperlipidemia No date: Hypertension No date: Myocardial infarction ([place])     Comment:  " I had 13 heart attacks, 8 massive" No date: Proteinuria No date: Renal insufficiency     Comment:  patient makes no urine No date: Secondary hyperparathyroidism of renal origin ([place]) No date: Shortness of breath dyspnea     Comment:  " when I drink a cup of cold water too fast" No date: Sleep apnea     Comment:  pt stated " long story" does not wear CPAP No date: Stroke [facility])   Reproductive/Obstetrics                             Anesthesia Physical Anesthesia Plan  ASA:   Anesthesia Plan:    Post-op Pain Management:    Induction:   PONV Risk Score and Plan:   Airway Management Planned:   Additional Equipment:   Intra-op Plan:   Post-operative Plan:   Informed Consent:   Plan Discussed with:   Anesthesia Plan Comments: (Patient who had cardiac clearance (per Dr. Chancy Milroy patient  (EF of 20%) had abnormal stress test and they tried to cath but were unable to establish big enough IV access) who showed up with systolic BPs in the [id number] (normally in the 150s), new EKG changes.  Last had dialysis on 9/1.  I spoke with Dr. Chancy Milroy over the phone, he would like to do the cardiac cath today before the surgery.  Also  agrees with dialysis  )       Anesthesia Quick Evaluation

## 2018-11-03 NOTE — Consult Note (Signed)
FRANK NEER is a 48 y.o. male  [id number]:[id number]  Primary Cardiologist: Neoma Laming Reason for Consultation: Abnormal stress test with history of ejection fraction 22% and preoperative evaluation  HPI: This is a 48 year old African-American male who presented to the hospital for hernia surgery and had abnormal EKG and blood pressure was 90 systolic and I was asked to evaluate the patient prior to surgery.  Patient denies any chest pain or shortness of breath or orthopnea or PND or leg swelling.   Review of Systems: No chest pains no shortness of breath   Past Medical History:  Diagnosis Date  . CAD (coronary artery disease)    status post multiple myocardial infarctions  . COPD (chronic obstructive pulmonary disease) ([place])   . ESRD (end stage renal disease) ([place])   . Hyperlipidemia   . Hypertension   . Myocardial infarction ([place])    " I had 13 heart attacks, 8 massive"  . Proteinuria   . Renal insufficiency    patient makes no urine  . Secondary hyperparathyroidism of renal origin ([place])   . Shortness of breath dyspnea    " when I drink a cup of cold water too fast"  . Sleep apnea    pt stated " long story" does not wear CPAP  . Stroke [facility])     Medications Prior to Admission  Medication Sig Dispense Refill  . amLODipine (NORVASC) 10 MG tablet Take 1 tablet (10 mg total) by mouth daily. 30 tablet 0  . atorvastatin (LIPITOR) 40 MG tablet Take 1 tablet (40 mg total) by mouth daily. 30 tablet 2  . calcium acetate (PHOSLO) 667 MG capsule Take 1,334 mg by mouth 3 (three) times daily with meals.    . carvedilol (COREG) 25 MG tablet Take 1 tablet (25 mg total) by mouth 2 (two) times daily. 60 tablet 0  . clopidogrel (PLAVIX) 75 MG tablet Take 1 tablet (75 mg total) by mouth daily. 30 tablet 2  . furosemide (LASIX) 80 MG tablet Take 80 mg by mouth See admin instructions. Take 1 tablet (80mg ) every morning on non-dialysis days    . isosorbide dinitrate (ISORDIL) 30 MG tablet Take 30 mg  by mouth 3 (three) times daily.     . hydrALAZINE (APRESOLINE) 100 MG tablet Take 1 tablet (100 mg total) by mouth 4 (four) times daily. (Patient taking differently: Take 100 mg by mouth every 8 (eight) hours. ) 120 tablet 0     . acetaminophen  1,000 mg Oral On Call to OR  . amLODipine  10 mg Oral Daily  . atorvastatin  40 mg Oral Daily  . calcium acetate  1,334 mg Oral TID WC  . carvedilol  25 mg Oral BID  . Chlorhexidine Gluconate Cloth  6 each Topical Once   And  . Chlorhexidine Gluconate Cloth  6 each Topical Once  . clopidogrel  75 mg Oral Daily  . famotidine  20 mg Oral Once  . gabapentin  300 mg Oral On Call to OR  . heparin  5,000 Units Subcutaneous Q8H  . hydrALAZINE  100 mg Oral Q8H  . isosorbide dinitrate  30 mg Oral TID    Infusions: . sodium chloride    .  ceFAZolin (ANCEF) IV      Allergies  Allergen Reactions  . Ibuprofen Other (See Comments)    Kidney   . Viagra [Sildenafil]     Lips swell   . Clonidine Derivatives Rash    Social History  Socioeconomic History  . Marital status: Single    Spouse name: Not on file  . Number of children: Not on file  . Years of education: Not on file  . Highest education level: Not on file  Occupational History  . Not on file  Social Needs  . Financial resource strain: Not on file  . Food insecurity    Worry: Not on file    Inability: Not on file  . Transportation needs    Medical: Not on file    Non-medical: Not on file  Tobacco Use  . Smoking status: Current Every Day Smoker    Packs/day: 0.25    Years: 25.00    Pack years: 6.25    Types: Cigarettes  . Smokeless tobacco: Never Used  Substance and Sexual Activity  . Alcohol use: No    Alcohol/week: 0.0 standard drinks    Frequency: Never    Comment: occasional  . Drug use: Yes    Types: Marijuana    Comment: daily  . Sexual activity: Not on file    Comment: " Smokes weed"  Lifestyle  . Physical activity    Days per week: Not on file    Minutes  per session: Not on file  . Stress: Not on file  Relationships  . Social Herbalist on phone: Not on file    Gets together: Not on file    Attends religious service: Not on file    Active member of club or organization: Not on file    Attends meetings of clubs or organizations: Not on file    Relationship status: Not on file  . Intimate partner violence    Fear of current or ex partner: Not on file    Emotionally abused: Not on file    Physically abused: Not on file    Forced sexual activity: Not on file  Other Topics Concern  . Not on file  Social History Narrative   Lives at home with [place]. Independent at baseline.    Family History  Problem Relation Age of Onset  . Heart disease Mother   . Hyperlipidemia Mother   . Hypertension Mother   . Kidney disease Father   . Kidney disease Brother   . Hyperlipidemia Sister   . Hyperlipidemia Daughter   . Hypertension Sister   . Hypertension Daughter     PHYSICAL EXAM: Vitals:   11/03/18 0857  BP: 98/61  Pulse: 73  Resp: 18  Temp: 97.6 F (36.4 C)  SpO2: 100%    No intake or output data in the 24 hours ending 11/03/18 1153  General:  Well appearing. No respiratory difficulty HEENT: normal Neck: supple. no JVD. Carotids 2+ bilat; no bruits. No lymphadenopathy or thryomegaly appreciated. Cor: PMI nondisplaced. Regular rate & rhythm. No rubs, gallops or murmurs. Lungs: clear Abdomen: soft, nontender, nondistended. No hepatosplenomegaly. No bruits or masses. Good bowel sounds. Extremities: no cyanosis, clubbing, rash, edema Neuro: alert & oriented x 3, cranial nerves grossly intact. moves all 4 extremities w/o difficulty. Affect pleasant.  ECG: Sinus bradycardia with heart rate 58 bpm with LVH with strain pattern causing the T wave inversion in the lateral leads  Results for orders placed or performed during the hospital encounter of 11/03/18 (from the past 24 hour(s))  I-STAT 4, (NA,K, GLUC, HGB,HCT)      Status: Abnormal   Collection Time: 11/03/18  8:52 AM  Result Value Ref Range   Sodium 135 135 - 145 mmol/L  Potassium 5.4 (H) 3.5 - 5.1 mmol/L   Glucose, Bld 77 70 - 99 mg/dL   HCT 45.0 39.0 - 52.0 %   Hemoglobin 15.3 13.0 - 17.0 g/dL   No results found.   ASSESSMENT AND PLAN: Patient has severe LV dysfunction and normally is hypertensive but presented to the OR with blood pressure 98/61.  Patient does not feel any different than usual denies any chest pain shortness of breath dizziness orthopnea PND or leg swelling.  Patient did miss dialysis and was hypotensive on the 31st when he was seen in the office.  His medications were adjusted and may have become more normotensive and anesthesia was concerned and wanted to reassess the patient.  His echocardiogram in the office showed left ventricular ejection fraction 20 to 25% with four-chamber dilatation.  Patient had stress test done last month which showed ischemia in the LAD territory and ejection fraction 22% on a nuclear stress test.  Patient underwent CTA coronaries but was unable to have angiogram due to inability to get IV access but calcium score was 3200.  Since patient would need coronary angiogram by catheterization as an outpatient will try to do cardiac catheter today.  However if unable to do it today patient can get dialysis and then be discharged in the morning and will see the patient in the office and will do outpatient cardiac cath.  Is probably best that cardiac cath should be done since patient has severe LV dysfunction but it does not have to be today since patient is clinically stable.      Consetta Cosner A

## 2018-11-04 NOTE — Progress Notes (Signed)
Notified MD about pts blood pressure of 129/75. Pt has coreg, hydralazine, and isosorbide dinitrate ordered. Pt refusing medications, but also medications not given with blood pressure measurements.  Not able to do assessment on pt. Pt refusing. Inappropriate to staff, letting us know not to come back into his room.

## 2018-11-04 NOTE — Progress Notes (Signed)
Patient yelled at Dr. Margaretmary Eddy and told her to leave his room.  Stated he was going home.  I asked him if he planned to leave the hospital Against Medical Advice, he said yes but refused to sign the form.  I explained to him where the exit to the hospital is.  He said he wasn't leaving until he talked to the [facility] knows him.  He couldn't give me a name, other than the doctor who knows me.  Colletta Maryland offered him some names, and he said it was Dr. Juleen China.  I explained that Dr. Juleen China wasn't on today.  But I would contact Dr. Holley Raring.  While I was signing on to the computer to send a message to Dr. Holley Raring the patient walked out of his room, yelled at someone in the hallway who was in his way.  I informed both Dr. Holley Raring and HD that he had left.

## 2018-11-04 NOTE — Progress Notes (Signed)
[facility]  ROUNDING NOTE   Subjective:  Patient well-known to [place] from prior admissions. He was to have had inguinal hernia repair however secondary to abnormal EKG cardiac catheterization was recommended. It appears that cardiac catheterization will not be considered on an outpatient basis. Patient due for dialysis treatment today as potassium was high at 6.5.   Objective:  Vital signs in last 24 hours:  Temp:  [98.2 F (36.8 C)] 98.2 F (36.8 C) (09/03 1349) Pulse Rate:  [64-76] 76 (09/03 2154) Resp:  [18] 18 (09/03 1802) BP: (123-133)/(75-83) 129/75 (09/03 2154) SpO2:  [98 %-100 %] 100 % (09/03 2154) Weight:  [79.9 kg] 79.9 kg (09/03 1354)  Weight change:  Filed Weights   11/03/18 1354  Weight: 79.9 kg    Intake/Output: No intake/output data recorded.   Intake/Output this shift:  Total I/O In: 240 [P.O.:240] Out: -   Physical Exam: General: No acute distress  Head: Normocephalic, atraumatic. Moist oral mucosal membranes  Eyes: Anicteric  Neck: Supple, trachea midline  Lungs:  Clear to auscultation, normal effort  Heart: S1S2 no rubs  Abdomen:  Soft, nontender, bowel sounds present  Extremities: No peripheral edema.  Neurologic: Awake, alert, following commands  Skin: No lesions       Basic Metabolic Panel: Recent Labs  Lab 11/01/18 1006 11/03/18 0852 11/03/18 1122  NA 138 135 136  K 5.7* 5.4* 6.5*  CL 102  --  99  CO2 17*  --  20*  GLUCOSE 127* 77 81  BUN 71*  --  61*  CREATININE 14.20*  --  12.54*  CALCIUM 8.9  --  9.0  MG  --   --  2.6*    Liver Function Tests: Recent Labs  Lab 11/03/18 1122  AST 13*  ALT 12  ALKPHOS 54  BILITOT 0.8  PROT 7.5  ALBUMIN 3.2*   No results for input(s): LIPASE, AMYLASE in the last 168 hours. No results for input(s): AMMONIA in the last 168 hours.  CBC: Recent Labs  Lab 11/03/18 0852 11/03/18 1122  WBC  --  6.9  NEUTROABS  --  4.5  HGB 15.3 13.7  HCT 45.0 42.9  MCV  --  89.6  PLT   --  215    Cardiac Enzymes: No results for input(s): CKTOTAL, CKMB, CKMBINDEX, TROPONINI in the last 168 hours.  BNP: Invalid input(s): POCBNP  CBG: No results for input(s): GLUCAP in the last 168 hours.  Microbiology: Results for orders placed or performed during the hospital encounter of 10/31/18  SARS CORONAVIRUS 2 (TAT 6-24 HRS) Nasopharyngeal Nasopharyngeal Swab     Status: None   Collection Time: 10/31/18 10:58 AM   Specimen: Nasopharyngeal Swab  Result Value Ref Range Status   SARS Coronavirus 2 NEGATIVE NEGATIVE Final    Comment: (NOTE) SARS-CoV-2 target nucleic acids are NOT DETECTED. The SARS-CoV-2 RNA is generally detectable in upper and lower respiratory specimens during the acute phase of infection. Negative results do not preclude SARS-CoV-2 infection, do not rule out co-infections with other pathogens, and should not be used as the sole basis for treatment or other patient management decisions. Negative results must be combined with clinical observations, patient history, and epidemiological information. The expected result is Negative. Fact Sheet for Patients: SugarRoll.be Fact Sheet for Healthcare Providers: https://www.woods-mathews.com/ This test is not yet approved or cleared by the [place] FDA and  has been authorized for detection and/or diagnosis of SARS-CoV-2 by FDA under an Emergency Use Authorization (EUA). This  EUA will remain  in effect (meaning this test can be used) for the duration of the COVID-19 declaration under Section 56 4(b)(1) of the Act, 21 U.S.C. section 360bbb-3(b)(1), unless the authorization is terminated or revoked sooner. Performed at [facility], [place] [address], [place], [place] [address]     Coagulation Studies: No results for input(s): LABPROT, INR in the last 72 hours.  Urinalysis: No results for input(s): COLORURINE, LABSPEC, PHURINE, GLUCOSEU, HGBUR, BILIRUBINUR,  KETONESUR, PROTEINUR, UROBILINOGEN, NITRITE, LEUKOCYTESUR in the last 72 hours.  Invalid input(s): APPERANCEUR    Imaging: No results found.   Medications:    . amLODipine  10 mg Oral Daily  . atorvastatin  40 mg Oral Daily  . calcium acetate  1,334 mg Oral TID WC  . carvedilol  25 mg Oral BID  . clopidogrel  75 mg Oral Daily  . heparin  5,000 Units Subcutaneous Q8H  . hydrALAZINE  100 mg Oral Q8H  . isosorbide dinitrate  30 mg Oral TID     Assessment/ Plan:  48 y.o. male with end stage renal disease on hemodialysis, hypertension, CVA, sleep apnea, coronary artery disease, COPD, anti-social personality disorder, dialysis treatment nonadherence  No assigned dialysis unit available  1.  ESRD on HD MWF. 2.  Hyperkalemia. 3.  Hypertension. 4.  Anemia of chronic kidney disease. 5.  Secondary hyperparathyroidism.  Plan: Patient was quite agitated earlier today.  He did not want to sign consent for dialysis however after considerable discussion with the patient and explanation he was willing to sign the consent for dialysis treatment.  We have recommended dialysis treatment given significant hyperkalemia with potassium of 6.5.  Orders have been prepared.  However after preparing orders we were told that patient signed out [place].  Hopefully he will change his mind and come back for dialysis treatment.  LOS: 1 [place] Winsor 9/4/202010:31 AM

## 2018-11-04 NOTE — Discharge Summary (Signed)
DOA 11/03/18  Date left AMA- 11/04/18  Patient is a 48 year old male with known history of uncontrolled hypertension due to medication noncompliance, multiple MIs with PCI and stents, CAD, COPD, secondary hyperparathyroidism, CHF with grade 2 diastolic dysfunction per 123XX123 echo, CVA, ESRD on hemodialysis (Monday, Wednesday, and Friday) who presented today for surgery, with a diagnosis of chronically incarcerated right inguinal hernia. Given his ongoing chronic medical condition and requiring hemodialysis hospitalist were contacted to admit for further management.  Patient's cardiologist Dr. Neoma Laming was consulted for abnormal stress test with a history of ejection fraction 22% and perioperative evaluation.  Patient denied any chest pain or shortness of breath or orthopnea or PND or leg swelling during evaluation.  Per cardiologist Dr. Humphrey Rolls, patient was scheduled to have cardiac cath for further evaluation however patient refused cath and was sent back to his room.  Patient however report that he did not understand the procedure therefore did not want to proceed despite the procedure being explained to him by his cardiology in the OR and also to his wife who verbalized understanding.  Per cardiology he does not have any significant EKG changes, troponin is unremarkable and he is not having any symptoms of chest pain, diaphoresis or shortness of breath.    Patient states that he was dialyzed on Wednesday therefore will need dialysis tomorrow.  Of note he has been [facility] for making threats to the dialysis staff, he is also been discharged from [facility] for similar behavior therefore will require dialysis prior to discharge tomorrow.  His labs today revealed potassium 5.4 repeat 6.5, BUN 61, creatinine 12.5, magnesium 2.6, and troponin 43.  Hospital course   1. Chronically incarcerated right inguinal hernia - was scheduled to have inguinal hernia repair with mesh  today pending cardiac cath. - Cardiology consulted consulted for cardiac clearance given abnormal stress test hx EF 22% - Patient refused cardiac cath, - Per cardiology will continue with medical management given he is asymptomatic if fails will require cardiac cath - Plan to follow with Dr. Neoma Laming on Tuesday at 10:00 - Plan for surgery following cardiac clearance as above - General surgery following  2. Elevated troponin - Asymptomatic - Trend troponins  3. ESRD - on HD Monday, Wednesday Friday -Nephrology consult placed to Dr. Holley Raring -Plan for HD tomorrow  4. Hyperkalemia - Will give calcium gluconate, sodium bicarb, IV insulin, dextrose and Lokelma - Recheck in 4 hours - Recheck potassium and mag in a.m. - For HD in a.m.  5. Chronic Diastolic CHF-stable - 0000000 with grade II diastolic dysfunction per 123XX123 echo - Continue Isordil, Coreg - Continue Lasix IV diureses >1L negative per day until approach euvolemia / worsening renal function. - Low salt diet  - Strict I's and O's  6. Coronary Artery Disease  - ASA 81mg  PO daily - Clopidogrel 75mg  PO daily - Following with cardiology  7. HLD  + Goal LDL<100 - Atorvastatin 40mg  PO qhs   8. HTN  + Goal BP <130/80 - Continue amlodipine, hydralazine, and Coreg  9. DVT prophylaxis - Heparin subq   11/04/18  Today pt is very agitated , refusing labs ,HD, and using " F" WORDS, didn't let me examine, said want to leave the hospital, refused to sign AMA papers and dressed himself and walked out of the building

## 2018-11-22 ENCOUNTER — Non-Acute Institutional Stay
Admission: EM | Admit: 2018-11-22 | Discharge: 2018-11-22 | Disposition: A | Discharge status: Home / Self Care | Payer: Medicare Other | Source: Home / Self Care | Attending: Student | Admitting: Student

## 2018-11-22 ENCOUNTER — Other Ambulatory Visit: Payer: Self-pay

## 2018-11-22 ENCOUNTER — Encounter: Payer: Self-pay | Admitting: Emergency Medicine

## 2018-11-22 ENCOUNTER — Emergency Department
Admission: EM | Admit: 2018-11-22 | Discharge: 2018-12-01 | Disposition: E | Payer: Medicare Other | Attending: Emergency Medicine | Admitting: Emergency Medicine

## 2018-11-22 DIAGNOSIS — Z7902 Long term (current) use of antithrombotics/antiplatelets: Secondary | ICD-10-CM | POA: Insufficient documentation

## 2018-11-22 DIAGNOSIS — J449 Chronic obstructive pulmonary disease, unspecified: Secondary | ICD-10-CM | POA: Insufficient documentation

## 2018-11-22 DIAGNOSIS — I469 Cardiac arrest, cause unspecified: Secondary | ICD-10-CM

## 2018-11-22 DIAGNOSIS — I2510 Atherosclerotic heart disease of native coronary artery without angina pectoris: Secondary | ICD-10-CM | POA: Insufficient documentation

## 2018-11-22 DIAGNOSIS — N186 End stage renal disease: Secondary | ICD-10-CM | POA: Insufficient documentation

## 2018-11-22 DIAGNOSIS — Z888 Allergy status to other drugs, medicaments and biological substances status: Secondary | ICD-10-CM | POA: Insufficient documentation

## 2018-11-22 DIAGNOSIS — Z79899 Other long term (current) drug therapy: Secondary | ICD-10-CM | POA: Insufficient documentation

## 2018-11-22 DIAGNOSIS — Z8349 Family history of other endocrine, nutritional and metabolic diseases: Secondary | ICD-10-CM | POA: Insufficient documentation

## 2018-11-22 DIAGNOSIS — E785 Hyperlipidemia, unspecified: Secondary | ICD-10-CM | POA: Insufficient documentation

## 2018-11-22 DIAGNOSIS — N2581 Secondary hyperparathyroidism of renal origin: Secondary | ICD-10-CM | POA: Insufficient documentation

## 2018-11-22 DIAGNOSIS — I5030 Unspecified diastolic (congestive) heart failure: Secondary | ICD-10-CM | POA: Insufficient documentation

## 2018-11-22 DIAGNOSIS — I252 Old myocardial infarction: Secondary | ICD-10-CM | POA: Insufficient documentation

## 2018-11-22 DIAGNOSIS — Z7901 Long term (current) use of anticoagulants: Secondary | ICD-10-CM | POA: Diagnosis not present

## 2018-11-22 DIAGNOSIS — Z992 Dependence on renal dialysis: Secondary | ICD-10-CM | POA: Insufficient documentation

## 2018-11-22 DIAGNOSIS — F17210 Nicotine dependence, cigarettes, uncomplicated: Secondary | ICD-10-CM | POA: Insufficient documentation

## 2018-11-22 DIAGNOSIS — I132 Hypertensive heart and chronic kidney disease with heart failure and with stage 5 chronic kidney disease, or end stage renal disease: Secondary | ICD-10-CM | POA: Insufficient documentation

## 2018-11-22 DIAGNOSIS — G4730 Sleep apnea, unspecified: Secondary | ICD-10-CM | POA: Insufficient documentation

## 2018-11-22 DIAGNOSIS — Z8673 Personal history of transient ischemic attack (TIA), and cerebral infarction without residual deficits: Secondary | ICD-10-CM | POA: Insufficient documentation

## 2018-11-22 DIAGNOSIS — Z886 Allergy status to analgesic agent status: Secondary | ICD-10-CM | POA: Insufficient documentation

## 2018-11-22 DIAGNOSIS — Z955 Presence of coronary angioplasty implant and graft: Secondary | ICD-10-CM | POA: Insufficient documentation

## 2018-11-22 DIAGNOSIS — Z8249 Family history of ischemic heart disease and other diseases of the circulatory system: Secondary | ICD-10-CM | POA: Insufficient documentation

## 2018-11-22 LAB — RENAL FUNCTION PANEL
Albumin: 3.3 g/dL — ABNORMAL LOW (ref 3.5–5.0)
Anion gap: 24 — ABNORMAL HIGH (ref 5–15)
BUN: 132 mg/dL — ABNORMAL HIGH (ref 6–20)
CO2: 14 mmol/L — ABNORMAL LOW (ref 22–32)
Calcium: 9.8 mg/dL (ref 8.9–10.3)
Chloride: 99 mmol/L (ref 98–111)
Creatinine, Ser: 25.5 mg/dL — ABNORMAL HIGH (ref 0.61–1.24)
GFR calc Af Amer: 2 mL/min — ABNORMAL LOW (ref >60)
GFR calc non Af Amer: 2 mL/min — ABNORMAL LOW (ref >60)
Glucose, Bld: 69 mg/dL — ABNORMAL LOW (ref 70–99)
Phosphorus: 12.4 mg/dL — ABNORMAL HIGH (ref 2.5–4.6)
Potassium: >7.5 mmol/L (ref 3.5–5.1)
Sodium: 137 mmol/L (ref 135–145)

## 2018-11-22 MED ORDER — HEPARIN SODIUM (PORCINE) 1000 UNIT/ML DIALYSIS: 1000.0000 [IU] | INTRAMUSCULAR | Status: DC | PRN

## 2018-11-22 MED ORDER — CALCIUM GLUCONATE 10 % IV SOLN
1.0000 g | Freq: Once | INTRAVENOUS | Status: AC
  Administered 2018-11-22: 21:00:00 1 g via INTRAVENOUS

## 2018-11-22 MED ORDER — LIDOCAINE-PRILOCAINE 2.5-2.5 % EX CREA
1.0000 "application " | TOPICAL_CREAM | CUTANEOUS | Status: DC | PRN
  Filled 2018-11-22: qty 5

## 2018-11-22 MED ORDER — PENTAFLUOROPROP-TETRAFLUOROETH EX AERO
1.0000 "application " | INHALATION_SPRAY | CUTANEOUS | Status: DC | PRN
  Filled 2018-11-22: qty 30

## 2018-11-22 MED ORDER — EPINEPHRINE PF 1 MG/ML IJ SOLN
1.0000 mg | Freq: Once | INTRAMUSCULAR | Status: AC
  Administered 2018-11-22: 1 mg via INTRAVENOUS

## 2018-11-22 MED ORDER — ALTEPLASE 2 MG IJ SOLR: 2.0000 mg | Freq: Once | INTRAMUSCULAR | Status: DC | PRN

## 2018-11-22 MED ORDER — SODIUM CHLORIDE 0.9 % IV SOLN: 100.0000 mL | INTRAVENOUS | Status: DC | PRN

## 2018-11-22 MED ORDER — LIDOCAINE HCL (PF) 1 % IJ SOLN
5.0000 mL | INTRAMUSCULAR | Status: DC | PRN
  Filled 2018-11-22: qty 5

## 2018-11-22 MED ORDER — CHLORHEXIDINE GLUCONATE CLOTH 2 % EX PADS
6.0000 | MEDICATED_PAD | Freq: Every day | CUTANEOUS | Status: DC
  Filled 2018-11-22: qty 6

## 2018-11-22 NOTE — Progress Notes (Signed)
Patient in cardiac arrest. Patient arrived with king airway in place. Patient was intubated by Dr Corky Downs 7.5 et tube 25 at lip. Positive color change noted. BBS noted. ACLS protocols followed.

## 2018-11-22 NOTE — ED Provider Notes (Signed)
[facility] Emergency Department Provider Note  ____________________________________________   First MD Initiated Contact with Patient 11/04/2018 1057     (approximate)  I have reviewed the triage vital signs and the nursing notes.   HISTORY  Chief Complaint Vascular Access Problem    HPI Fernando Salas is a 48 y.o. male is here for dialysis.  States he has not had dialysis in a while.  He normally goes to [facility] but has been kicked out of multiple other dialysis centers also.  He typically comes here for dialysis.  No fever or chills.  No chest pain or shortness of breath.    Past Medical History:  Diagnosis Date  . CAD (coronary artery disease)    status post multiple myocardial infarctions  . COPD (chronic obstructive pulmonary disease) ([place])   . ESRD (end stage renal disease) ([place])   . Hyperlipidemia   . Hypertension   . Myocardial infarction ([place])    " I had 13 heart attacks, 8 massive"  . Proteinuria   . Renal insufficiency    patient makes no urine  . Secondary hyperparathyroidism of renal origin ([place])   . Shortness of breath dyspnea    " when I drink a cup of cold water too fast"  . Sleep apnea    pt stated " long story" does not wear CPAP  . Stroke [facility])     Patient Active Problem List   Diagnosis Date Noted  . Pre-operative cardiovascular examination, new EKG abnormalities c/w ischemia 11/03/2018  . ESRD (end stage renal disease) on dialysis ([place]) 06/27/2018  . Influenza A 04/26/2018  . Hypertensive urgency, malignant 04/08/2018  . Hypertensive urgency 03/25/2018  . CAD (coronary artery disease) 11/05/2017  . (HFpEF) heart failure with preserved ejection fraction ([place]) 06/20/2017  . 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 Essential hypertension 06/19/2017  . Marijuana abuse, continuous 06/19/2017  . Tobacco use disorder 06/19/2017  . Chest pain 05/14/2017  . Pulmonary edema 03/09/2017  . Adult  antisocial behavior 02/09/2017  . Fluid overload 02/08/2017  . Accelerated hypertension 01/12/2017  . AKI (acute kidney injury) ([place])   . Unstable angina ([place]) 11/01/2016    Past Surgical History:  Procedure Laterality Date  . AV FISTULA PLACEMENT Left 01-24-14   By Dr. Hortencia Pilar in [place]  . AV FISTULA PLACEMENT Right 04/30/2014   Procedure: RIGHT BRACHIOCEPHALIC ARTERIOVENOUS (AV) FISTULA CREATION;  Surgeon: Elam Dutch, MD;  Location: [facility] OR;  Service: Vascular;  Laterality: Right;  . AV FISTULA PLACEMENT Left 05/31/2014   Procedure: Left INSERTION OF ARTERIOVENOUS (AV) GORE-TEX GRAFT ARM;  Surgeon: Elam Dutch, MD;  Location: [place];  Service: Vascular;  Laterality: Left;  . CARDIAC CATHETERIZATION    . CORONARY STENT PLACEMENT     13 stents total  . DIALYSIS/PERMA CATHETER INSERTION N/A 01/15/2017   Procedure: DIALYSIS/PERMA CATHETER INSERTION;  Surgeon: Katha Cabal, MD;  Location: [place] CV LAB;  Service: Cardiovascular;  Laterality: N/A;    Prior to Admission medications   Medication Sig Start Date End Date Taking? Authorizing Provider  amLODipine (NORVASC) 10 MG tablet Take 1 tablet (10 mg total) by mouth daily. 05/01/17 11/03/18  Lisa Roca, MD  atorvastatin (LIPITOR) 40 MG tablet Take 1 tablet (40 mg total) by mouth daily. 11/03/16   Demetrios Loll, MD  calcium acetate (PHOSLO) 667 MG capsule Take 1,334 mg by mouth 3 (three) times daily with meals. 12/03/17 12/03/18  [provider]  carvedilol (COREG) 25 MG tablet Take 1 tablet (25 mg total) by mouth 2 (two) times daily. 05/01/17 11/03/18  Lisa Roca, MD  clopidogrel (PLAVIX) 75 MG tablet Take 1 tablet (75 mg total) by mouth daily. 11/03/16   Demetrios Loll, MD  furosemide (LASIX) 80 MG tablet Take 80 mg by mouth See admin instructions. Take 1 tablet (80mg ) every morning on non-dialysis days 05/16/18   [provider]  hydrALAZINE (APRESOLINE) 100 MG tablet Take 1 tablet (100 mg total) by mouth  4 (four) times daily. Patient taking differently: Take 100 mg by mouth every 8 (eight) hours.  05/01/17 07/01/18  Lisa Roca, MD  isosorbide dinitrate (ISORDIL) 30 MG tablet Take 30 mg by mouth 3 (three) times daily.     [provider]  nitroGLYCERIN (NITROSTAT) 0.4 MG SL tablet Place 0.4 mg under the tongue as needed. 11/01/18   [provider]    Allergies Ibuprofen, Viagra [sildenafil], and Clonidine derivatives  Family History  Problem Relation Age of Onset  . Heart disease Mother   . Hyperlipidemia Mother   . Hypertension Mother   . Kidney disease Father   . Kidney disease Brother   . Hyperlipidemia Sister   . Hyperlipidemia Daughter   . Hypertension Sister   . Hypertension Daughter     Social History Social History   Tobacco Use  . Smoking status: Current Every Day Smoker    Packs/day: 0.25    Years: 25.00    Pack years: 6.25    Types: Cigarettes  . Smokeless tobacco: Never Used  Substance Use Topics  . Alcohol use: No    Alcohol/week: 0.0 standard drinks    Frequency: Never    Comment: occasional  . Drug use: Yes    Types: Marijuana    Comment: daily    Review of Systems  Constitutional: No fever/chills, here for dialysis Eyes: No visual changes. ENT: No sore throat. Respiratory: Denies cough Genitourinary: Negative for dysuria. Musculoskeletal: Negative for back pain. Skin: Negative for rash.    ____________________________________________   PHYSICAL EXAM:  VITAL SIGNS: ED Triage Vitals  Enc Vitals Group     BP 11/14/2018 1110 (!) 149/86     Pulse Rate 11/19/2018 1110 78     Resp 11/25/2018 1110 18     Temp 11/29/2018 1110 (!) 97.5 F (36.4 C)     Temp Source 11/10/2018 1110 Oral     SpO2 11/12/2018 1110 100 %     Weight --      Height --      Head Circumference --      Peak Flow --      Pain Score 11/02/2018 1045 9     Pain Loc --      Pain Edu? --      Excl. in [place]? --     Constitutional: Alert and oriented. Well appearing and  in no acute distress. Eyes: Conjunctivae are normal.  Head: Atraumatic. Nose: No congestion/rhinnorhea. Mouth/Throat: Mucous membranes are moist.   Neck:  supple no lymphadenopathy noted Cardiovascular: Normal rate, regular rhythm. Respiratory: Normal respiratory effort.  No retractions,  GU: deferred Musculoskeletal: FROM all extremities, warm and well perfused Neurologic:  Normal speech and language.  Skin:  Skin is warm, dry and intact. No rash noted. Psychiatric: Mood and affect are normal. Speech and behavior are normal.  ____________________________________________   LABS (all labs ordered are listed, but only abnormal results are displayed)  Labs Reviewed - No data to display ____________________________________________  ____________________________________________  RADIOLOGY    ____________________________________________   PROCEDURES  Procedure(s) performed: Patient was sent to dialysis   Procedures    ____________________________________________   INITIAL IMPRESSION / ASSESSMENT AND PLAN / ED COURSE  Pertinent labs & imaging results that were available during my care of the patient were reviewed by me and considered in my medical decision making (see chart for details).   Patient is 48 year old male presents emergency department with need of dialysis.  He was sent to the dialysis center.    Fernando Salas was evaluated in Emergency Department on 11/19/2018 for the symptoms described in the history of present illness. He was evaluated in the context of the global COVID-19 pandemic, which necessitated consideration that the patient might be at risk for infection with the SARS-CoV-2 virus that causes COVID-19. Institutional protocols and algorithms that pertain to the evaluation of patients at risk for COVID-19 are in a state of rapid change based on information released by regulatory bodies including the CDC and federal and state organizations. These policies  and algorithms were followed during the patient's care in the ED.   As part of my medical decision making, I reviewed the following data within the [place] notes reviewed and incorporated, Old chart reviewed, Notes from prior ED visits and [place] Controlled Substance Database  ____________________________________________   FINAL CLINICAL IMPRESSION(S) / ED DIAGNOSES  Final diagnoses:  Admission for dialysis [facility])      NEW MEDICATIONS STARTED DURING THIS VISIT:  New Prescriptions   No medications on file     Note:  This document was prepared using Dragon voice recognition software and may include unintentional dictation errors.    Versie Starks, PA-C 11/26/2018 1117    Lilia Pro., MD 11/01/2018 [id number]

## 2018-11-22 NOTE — ED Notes (Addendum)
Per EMS, before arrival pt had been down since 2030. Per EMS, pt was initially in v-fib, progressed to PEA, and then finally progressed to asystole on the truck ride over here. Pt was given 7 rounds of EPI, 450 of amiodarone, 1 round of bicarb, 1 round of calcium, and D10 due to CBG being 51, and a total of 6 shocks given. On arrival, CPR was in progress.

## 2018-11-22 NOTE — ED Notes (Addendum)
Pulse check. Pt without pulse. Asystole on the monitor. Dr. Corky Downs auscultated for heart sounds and breath sounds. Per Dr. Corky Downs TOD 2115.

## 2018-11-22 NOTE — ED Notes (Signed)
Pulse check. Pt without pulse. PEA on monitor. CPR continued.

## 2018-11-22 NOTE — ED Notes (Signed)
Pulse check. Pt without pulse. Asystole on the monitor. CPR continued.

## 2018-11-22 NOTE — Progress Notes (Signed)
Treatment initiated no problems to note goal to remove 3L for 3.5 hour treatment   11/24/2018 1158  Vital Signs  Pulse Rate 71  Pulse Rate Source Monitor  Resp 16  BP 136/87  BP Location Right Arm  BP Method Automatic  Patient Position (if appropriate) Lying  Oxygen Therapy  SpO2 100 %  O2 Device Room Air  During Hemodialysis Assessment  Blood Flow Rate (mL/min) 350 mL/min  Arterial Pressure (mmHg) -200 mmHg  Venous Pressure (mmHg) 140 mmHg  Transmembrane Pressure (mmHg) 60 mmHg  Ultrafiltration Rate (mL/min) 930 mL/min  Dialysate Flow Rate (mL/min) 800 ml/min  Conductivity: Machine  13.5  HD Safety Checks Performed Yes  Dialysis Fluid Bolus Normal Saline  Bolus Amount (mL) 250 mL  Intra-Hemodialysis Comments Tx initiated  Education / Care Plan  Dialysis Education Provided Yes  Documented Education in Care Plan Yes

## 2018-11-22 NOTE — ED Notes (Signed)
See triage note  States he needs dialysis  Last treatment was about 2 weeks ago at [facility]

## 2018-11-22 NOTE — Progress Notes (Signed)
Death visit. Chaplain offered prayer with family. Family was grieving together. Family desires to use [facility]. Elder Love mother is primary contact.     11/17/2018 2200  Clinical Encounter Type  Visited With Family  Visit Type Death  Spiritual Encounters  Spiritual Needs Grief support;Prayer

## 2018-11-22 NOTE — ED Notes (Signed)
Taken to dialysis via w/c    

## 2018-11-22 NOTE — ED Provider Notes (Addendum)
[facility] Emergency Department Provider Note   ____________________________________________    I have reviewed the triage vital signs and the nursing notes.   HISTORY  Chief Complaint Cardiac arrest    HPI Fernando Salas is a 48 y.o. male brought in by EMS, ACLS in progress.  Reportedly patient had normal dialysis today had been doing well today at home, apparently took a nap woke up said he was not feeling well and collapsed.  EMS performed ACLS including giving amiodarone, calcium, bicarb placed King airway  Past Medical History:  Diagnosis Date  . CAD (coronary artery disease)    status post multiple myocardial infarctions  . COPD (chronic obstructive pulmonary disease) ([place])   . ESRD (end stage renal disease) ([place])   . Hyperlipidemia   . Hypertension   . Myocardial infarction ([place])    " I had 13 heart attacks, 8 massive"  . Proteinuria   . Renal insufficiency    patient makes no urine  . Secondary hyperparathyroidism of renal origin ([place])   . Shortness of breath dyspnea    " when I drink a cup of cold water too fast"  . Sleep apnea    pt stated " long story" does not wear CPAP  . Stroke [facility])     Patient Active Problem List   Diagnosis Date Noted  . Pre-operative cardiovascular examination, new EKG abnormalities c/w ischemia 11/03/2018  . ESRD (end stage renal disease) on dialysis ([place]) 06/27/2018  . Influenza A 04/26/2018  . Hypertensive urgency, malignant 04/08/2018  . Hypertensive urgency 03/25/2018  . CAD (coronary artery disease) 11/05/2017  . (HFpEF) heart failure with preserved ejection fraction ([place]) 06/20/2017  . 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 Essential hypertension 06/19/2017  . Marijuana abuse, continuous 06/19/2017  . Tobacco use disorder 06/19/2017  . Chest pain 05/14/2017  . Pulmonary edema 03/09/2017  . 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 Fluid overload 02/08/2017  .  Accelerated hypertension 01/12/2017  . AKI (acute kidney injury) ([place])   . Unstable angina ([place]) 11/01/2016    Past Surgical History:  Procedure Laterality Date  . AV FISTULA PLACEMENT Left 01-24-14   By Dr. Hortencia Pilar in [place]  . AV FISTULA PLACEMENT Right 04/30/2014   Procedure: RIGHT BRACHIOCEPHALIC ARTERIOVENOUS (AV) FISTULA CREATION;  Surgeon: Elam Dutch, MD;  Location: [facility] OR;  Service: Vascular;  Laterality: Right;  . AV FISTULA PLACEMENT Left 05/31/2014   Procedure: Left INSERTION OF ARTERIOVENOUS (AV) GORE-TEX GRAFT ARM;  Surgeon: Elam Dutch, MD;  Location: [place];  Service: Vascular;  Laterality: Left;  . CARDIAC CATHETERIZATION    . CORONARY STENT PLACEMENT     13 stents total  . DIALYSIS/PERMA CATHETER INSERTION N/A 01/15/2017   Procedure: DIALYSIS/PERMA CATHETER INSERTION;  Surgeon: Katha Cabal, MD;  Location: [place] CV LAB;  Service: Cardiovascular;  Laterality: N/A;    Prior to Admission medications   Medication Sig Start Date End Date Taking? Authorizing Provider  amLODipine (NORVASC) 10 MG tablet Take 1 tablet (10 mg total) by mouth daily. 05/01/17 11/03/18  Lisa Roca, MD  atorvastatin (LIPITOR) 40 MG tablet Take 1 tablet (40 mg total) by mouth daily. 11/03/16   Demetrios Loll, MD  calcium acetate (PHOSLO) 667 MG capsule Take 1,334 mg by mouth 3 (three) times daily with meals. 12/03/17 12/03/18  [provider]  carvedilol (COREG) 25 MG tablet Take 1 tablet (25 mg total) by mouth 2 (two) times daily.  05/01/17 11/03/18  Lisa Roca, MD  clopidogrel (PLAVIX) 75 MG tablet Take 1 tablet (75 mg total) by mouth daily. 11/03/16   Demetrios Loll, MD  furosemide (LASIX) 80 MG tablet Take 80 mg by mouth See admin instructions. Take 1 tablet (80mg ) every morning on non-dialysis days 05/16/18   [provider]  hydrALAZINE (APRESOLINE) 100 MG tablet Take 1 tablet (100 mg total) by mouth 4 (four) times daily. Patient taking differently: Take 100 mg by  mouth every 8 (eight) hours.  05/01/17 07/01/18  Lisa Roca, MD  isosorbide dinitrate (ISORDIL) 30 MG tablet Take 30 mg by mouth 3 (three) times daily.     [provider]  nitroGLYCERIN (NITROSTAT) 0.4 MG SL tablet Place 0.4 mg under the tongue as needed. 11/01/18   [provider]     Allergies Ibuprofen, Viagra [sildenafil], and Clonidine derivatives  Family History  Problem Relation Age of Onset  . Heart disease Mother   . Hyperlipidemia Mother   . Hypertension Mother   . Kidney disease Father   . Kidney disease Brother   . Hyperlipidemia Sister   . Hyperlipidemia Daughter   . Hypertension Sister   . Hypertension Daughter     Social History Social History   Tobacco Use  . Smoking status: Current Every Day Smoker    Packs/day: 0.25    Years: 25.00    Pack years: 6.25    Types: Cigarettes  . Smokeless tobacco: Never Used  Substance Use Topics  . Alcohol use: No    Alcohol/week: 0.0 standard drinks    Frequency: Never    Comment: occasional  . Drug use: Yes    Types: Marijuana    Comment: daily    Review of Systems     ____________________________________________   PHYSICAL EXAM:  VITAL SIGNS: ED Triage Vitals  Enc Vitals Group     BP      Pulse      Resp      Temp      Temp src      SpO2      Weight      Height      Head Circumference      Peak Flow      Pain Score      Pain Loc      Pain Edu?      Excl. in [place]?     Constitutional: Unresponsive Eyes: Pupils fixed and dilated Head: No evidence of trauma  Mouth/Throat: Mucous membranes are moist.    Cardiovascular: Chest compressions in progress Respiratory: King airway, bag-valve-mask Gastrointestinal: Soft  Musculoskeletal: No lower extremity tenderness nor edema.  Cool extremities Neurologic: Unresponsive Skin:  Skin is intact   ____________________________________________   LABS (all labs ordered are listed, but only abnormal results are displayed)  Labs  Reviewed - No data to display ____________________________________________  EKG  None ____________________________________________  RADIOLOGY  None ____________________________________________   PROCEDURES  Procedure(s) performed: yes  Procedure Name: Intubation Date/Time: 11/20/2018 9:29 PM Performed by: Lavonia Drafts, MD Pre-anesthesia Checklist: Patient identified, Patient being monitored, Emergency Drugs available, Timeout performed and Suction available Oxygen Delivery Method: Non-rebreather mask Preoxygenation: Pre-oxygenation with 100% oxygen Induction Type: Rapid sequence Ventilation: Mask ventilation without difficulty Laryngoscope Size: Glidescope and 4 Grade View: Grade II Tube size: 7.5 mm Number of attempts: 1 Placement Confirmation: ETT inserted through vocal cords under direct vision,  CO2 detector and Breath sounds checked- equal and bilateral        Critical Care  performed: yes  CRITICAL CARE Performed by: Lavonia Drafts   Total critical care time: 20 minutes  Critical care time was exclusive of separately billable procedures and treating other patients.  Critical care was necessary to treat or prevent imminent or life-threatening deterioration.  Critical care was time spent personally by me on the following activities: development of treatment plan with patient and/or surrogate as well as nursing, discussions with consultants, evaluation of patient's response to treatment, examination of patient, obtaining history from patient or surrogate, ordering and performing treatments and interventions, ordering and review of laboratory studies, ordering and review of radiographic studies, pulse oximetry and re-evaluation of patient's condition.  ____________________________________________   INITIAL IMPRESSION / ASSESSMENT AND PLAN / ED COURSE  Pertinent labs & imaging results that were available during my care of the patient were reviewed by me and  considered in my medical decision making (see chart for details).  Patient presents after cardiac arrest, ACLS in progress.  Patient intubated and ACLS continued in the emergency department, additional round of epi and calcium given, continued asystole on the monitor.  Patient total downtime greater than 45 minutes of ACLS, patient pronounced at 9:16 PM by me  D/w representative of the ME's office Izora Ribas who notes patient would not be ME case    ____________________________________________   FINAL CLINICAL IMPRESSION(S) / ED DIAGNOSES  Final diagnoses:  Cardiac arrest [facility])        Note:  This document was prepared using Dragon voice recognition software and may include unintentional dictation errors.   Lavonia Drafts, MD 11/26/2018 2129    Lavonia Drafts, MD 11/21/2018 [id number]    Lavonia Drafts, MD Dec 01, 2018 1158

## 2018-11-22 NOTE — ED Notes (Signed)
Paged chaplin

## 2018-11-22 NOTE — ED Notes (Addendum)
Pt arrived. CPR in progress. Dr. Corky Downs at bedside

## 2018-11-22 NOTE — Progress Notes (Signed)
Received report from ER nurse that potassium result was 8.2, Dr. Holley Raring notified bath changed to 1K

## 2018-11-22 NOTE — ED Triage Notes (Signed)
Here for dialysis 

## 2018-11-23 LAB — HEPATITIS B SURFACE ANTIGEN: Hepatitis B Surface Ag: NEGATIVE

## 2018-11-23 LAB — HEPATITIS B SURFACE ANTIBODY, QUANTITATIVE: Hep B S AB Quant (Post): <3.1 m[IU]/mL — ABNORMAL LOW (ref >9.9)

## 2018-12-01 DEATH — deceased

## 2019-07-14 IMAGING — DX PORTABLE CHEST - 1 VIEW
1 series · 1 of 1 positions shown · non-contrast
Comparison: 06/10/2018

CLINICAL DATA: Shortness of breath

EXAM:
PORTABLE CHEST 1 VIEW

[chest ap]
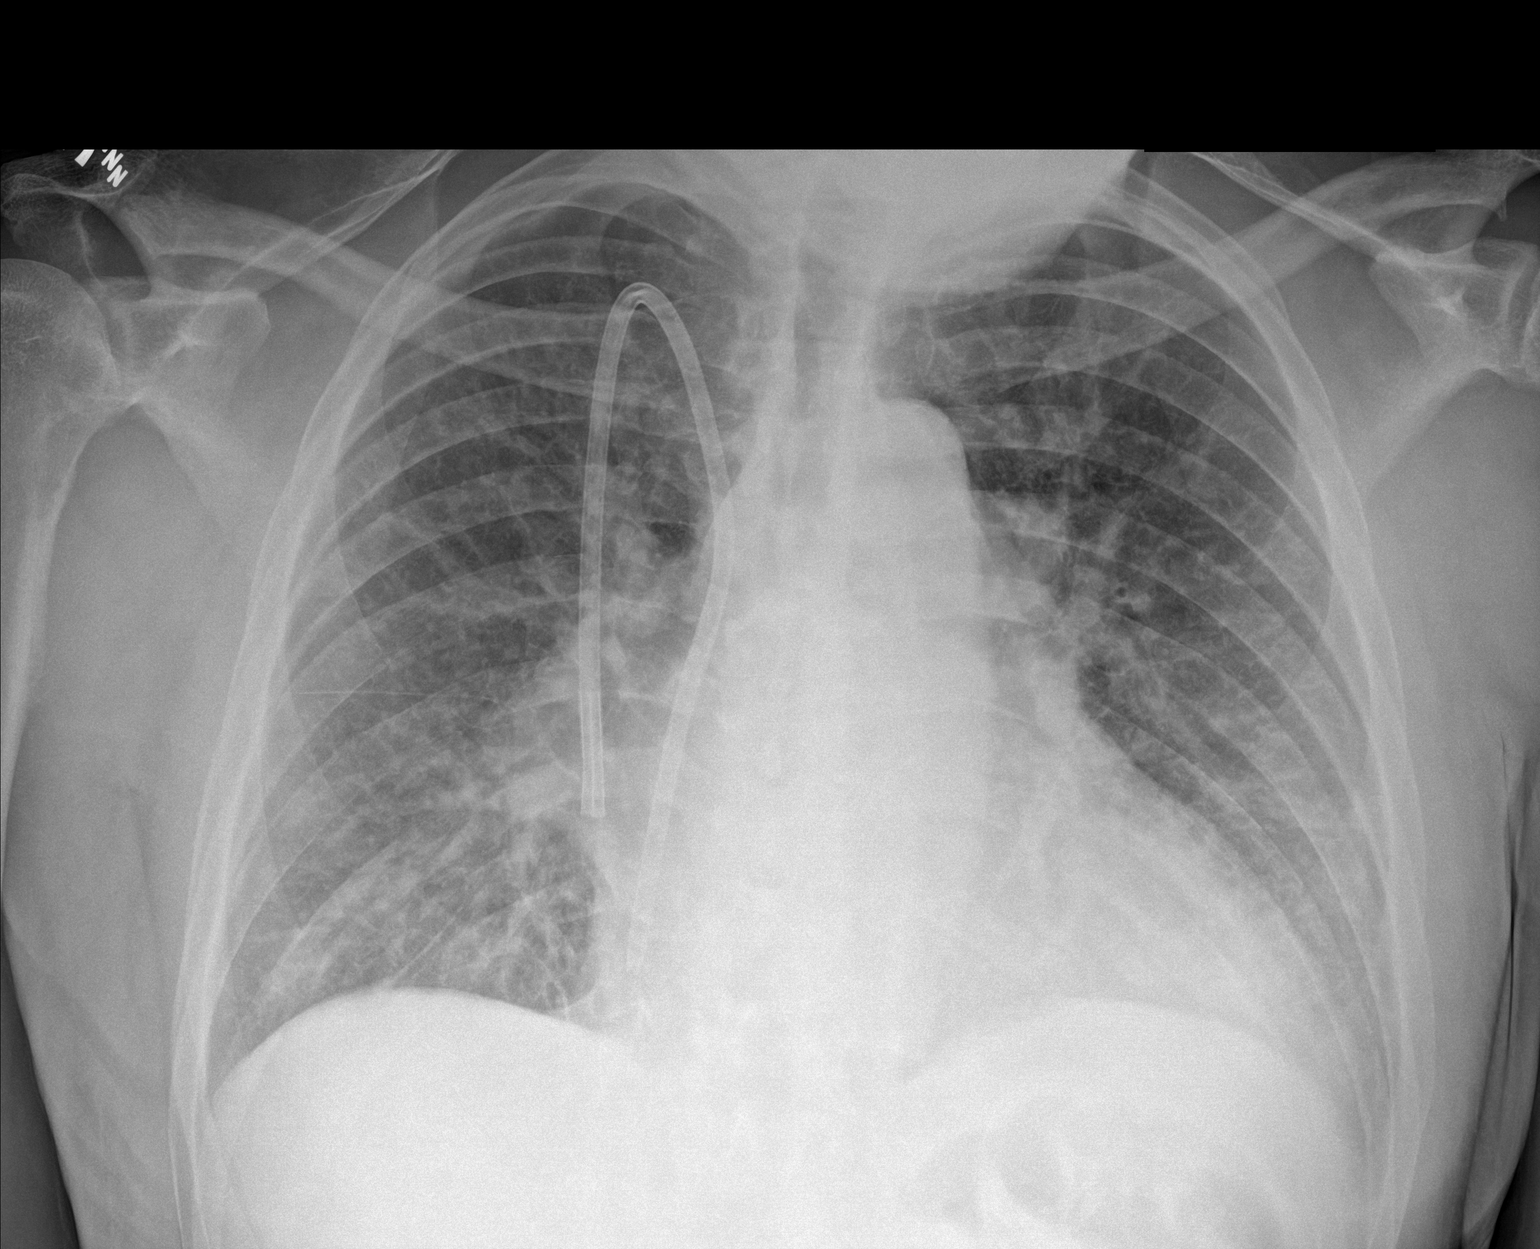

[1 of 1 positions shown; findings below may reference images not displayed]

FINDINGS: Dialysis catheter on the right with tip at the right atrium. There
is cardiomegaly and congested appearance of the hila. Diffuse
interstitial opacity with a few Kerley lines. No effusion or
pneumothorax.
IMPRESSION: Cardiomegaly and pulmonary edema.

## 2019-07-21 IMAGING — DX PORTABLE CHEST - 1 VIEW
1 series · 1 of 1 positions shown · non-contrast
Comparison: 07/08/2018

CLINICAL DATA: Shortness of breath

EXAM:
PORTABLE CHEST 1 VIEW

[chest ap]
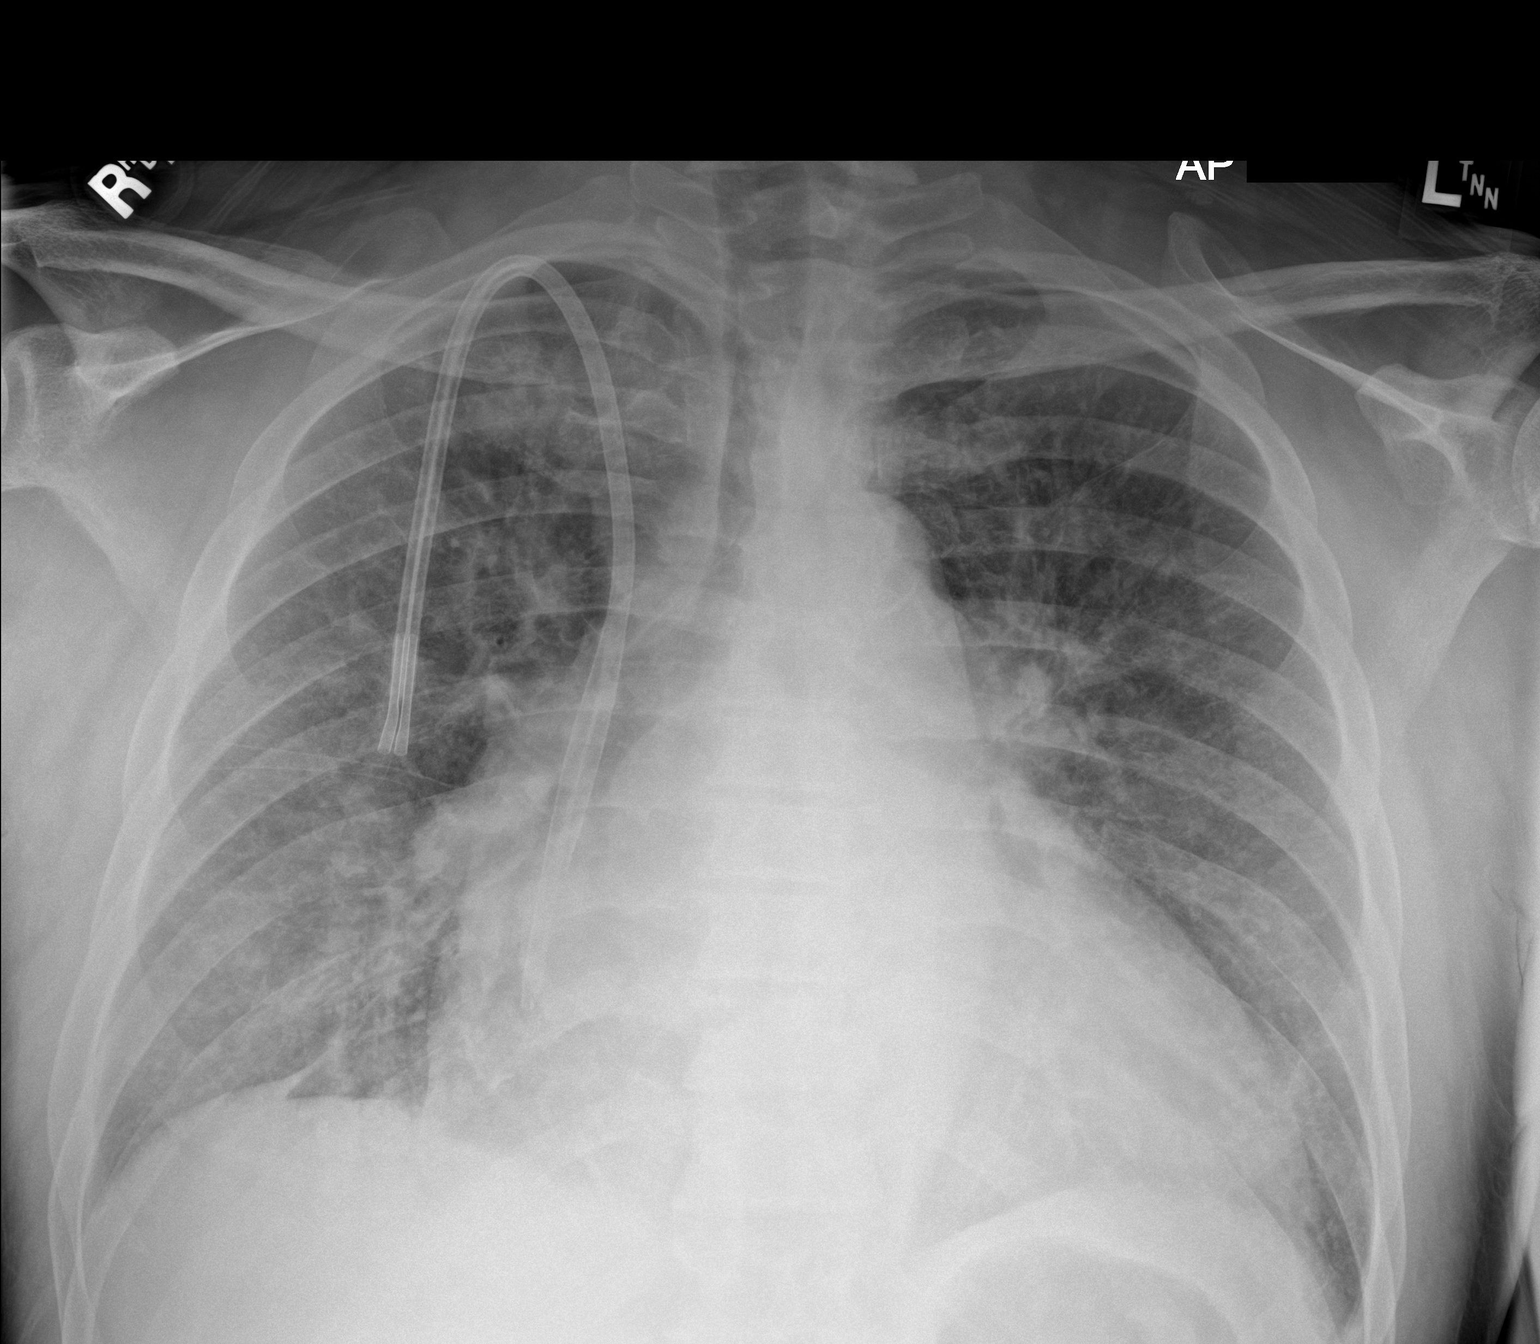

[1 of 1 positions shown; findings below may reference images not displayed]

FINDINGS: Cardiac shadow remains enlarged. Dialysis catheter is again seen.
Vascular congestion and pulmonary edema is identified similar to
that noted on the prior exam likely related to volume overload. No
bony abnormality is seen.
IMPRESSION: Vascular congestion and pulmonary edema consistent with fluid
overload.
